# Patient Record
Sex: Female | Born: 1975 | Race: Black or African American | Hispanic: No | Smoking: Never smoker
Health system: Southern US, Community
[De-identification: ages and names within clinical notes are randomized; demographics above are authoritative.]

## PROBLEM LIST (undated history)

## (undated) DIAGNOSIS — I10 Essential (primary) hypertension: Secondary | ICD-10-CM

## (undated) DIAGNOSIS — D649 Anemia, unspecified: Secondary | ICD-10-CM

## (undated) DIAGNOSIS — I2699 Other pulmonary embolism without acute cor pulmonale: Secondary | ICD-10-CM

## (undated) DIAGNOSIS — N920 Excessive and frequent menstruation with regular cycle: Secondary | ICD-10-CM

## (undated) DIAGNOSIS — C541 Malignant neoplasm of endometrium: Secondary | ICD-10-CM

## (undated) HISTORY — DX: Excessive and frequent menstruation with regular cycle: N92.0

## (undated) HISTORY — DX: Other pulmonary embolism without acute cor pulmonale: I26.99

## (undated) HISTORY — DX: Anemia, unspecified: D64.9

## (undated) HISTORY — DX: Malignant neoplasm of endometrium: C54.1

---

## 2004-06-11 ENCOUNTER — Emergency Department: Payer: Self-pay | Admitting: Emergency Medicine

## 2004-08-25 ENCOUNTER — Other Ambulatory Visit: Payer: Self-pay

## 2004-08-25 ENCOUNTER — Emergency Department: Payer: Self-pay | Admitting: Emergency Medicine

## 2005-05-28 ENCOUNTER — Emergency Department: Payer: Self-pay | Admitting: Emergency Medicine

## 2005-08-19 ENCOUNTER — Emergency Department: Payer: Self-pay | Admitting: Unknown Physician Specialty

## 2006-04-14 ENCOUNTER — Emergency Department: Payer: Self-pay | Admitting: Emergency Medicine

## 2006-04-14 ENCOUNTER — Other Ambulatory Visit: Payer: Self-pay

## 2006-08-05 ENCOUNTER — Emergency Department: Payer: Self-pay | Admitting: Emergency Medicine

## 2006-10-13 ENCOUNTER — Emergency Department: Payer: Self-pay | Admitting: Emergency Medicine

## 2007-04-16 ENCOUNTER — Emergency Department: Payer: Self-pay | Admitting: Emergency Medicine

## 2007-04-28 ENCOUNTER — Emergency Department: Payer: Self-pay | Admitting: Emergency Medicine

## 2007-05-03 ENCOUNTER — Ambulatory Visit: Payer: Self-pay | Admitting: Family Medicine

## 2007-06-16 ENCOUNTER — Emergency Department: Payer: Self-pay | Admitting: Emergency Medicine

## 2007-10-14 ENCOUNTER — Emergency Department: Payer: Self-pay | Admitting: Emergency Medicine

## 2008-01-26 ENCOUNTER — Emergency Department: Payer: Self-pay | Admitting: Internal Medicine

## 2008-03-15 ENCOUNTER — Emergency Department: Payer: Self-pay | Admitting: Internal Medicine

## 2008-05-01 ENCOUNTER — Encounter: Payer: Self-pay | Admitting: Specialist

## 2008-05-25 ENCOUNTER — Encounter: Payer: Self-pay | Admitting: Specialist

## 2009-12-04 IMAGING — US US EXTREM LOW VENOUS*R*
1 series · 17 of 18 positions shown · non-contrast
Comparison: none

REASON FOR EXAM: pain,
COMMENTS:

[Series 1: us extrem low venous*right* · 17 of 18 slices shown]
[im 1/18]
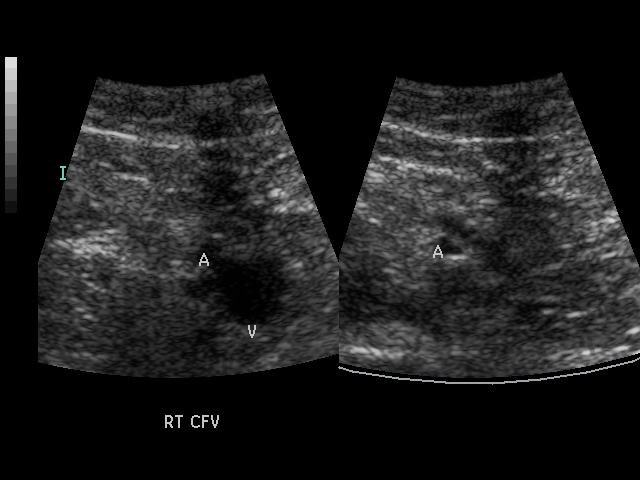
[im 2/18]
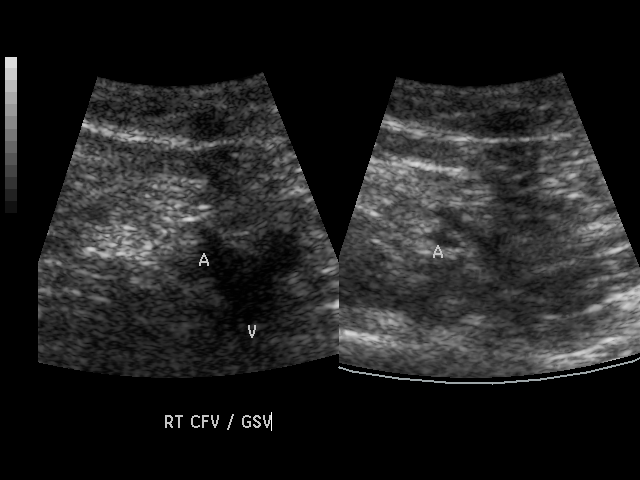
[im 3/18]
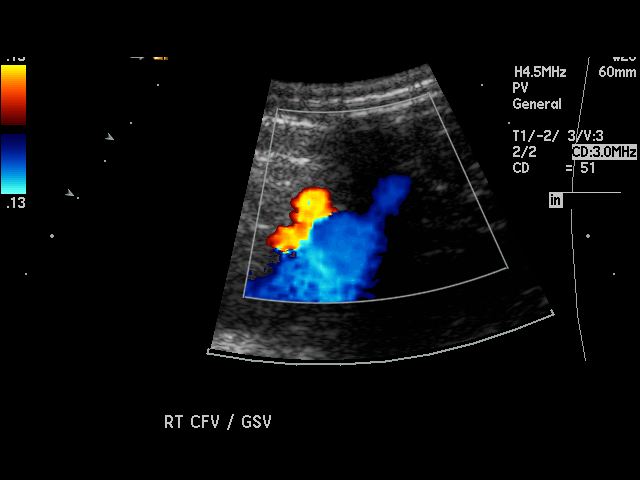
[im 4/18]
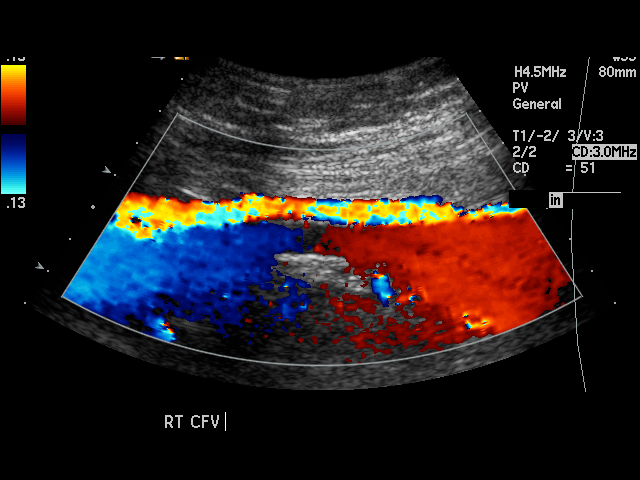
[im 5/18]
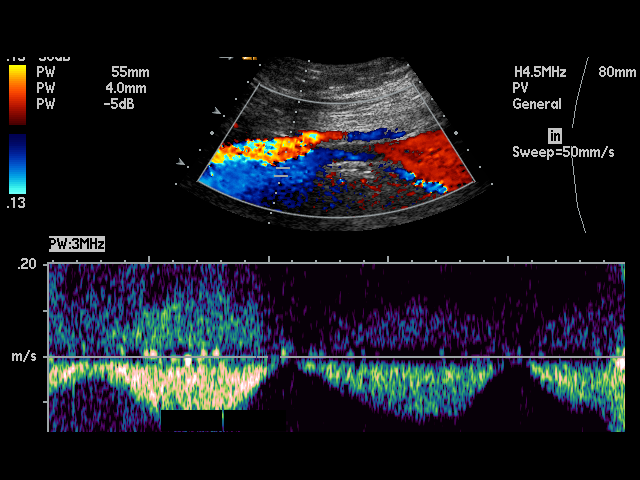
[im 6/18]
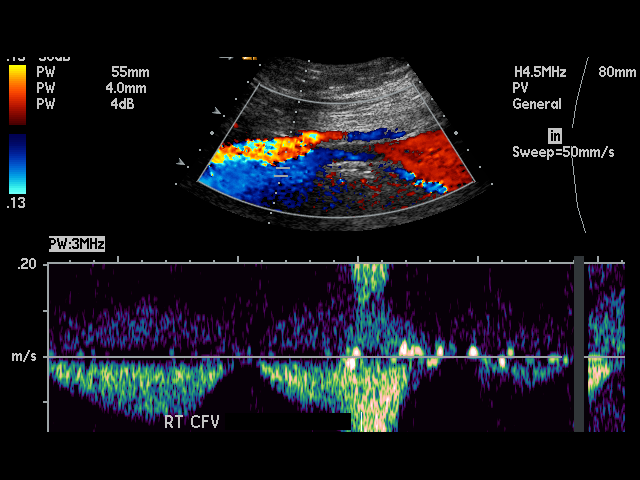
[im 7/18]
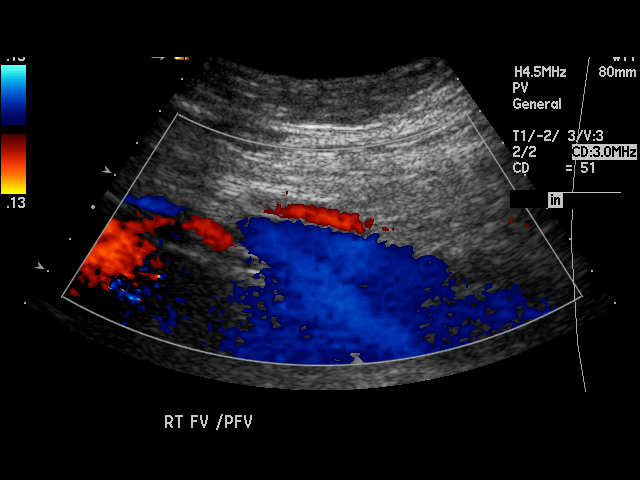
[im 8/18]
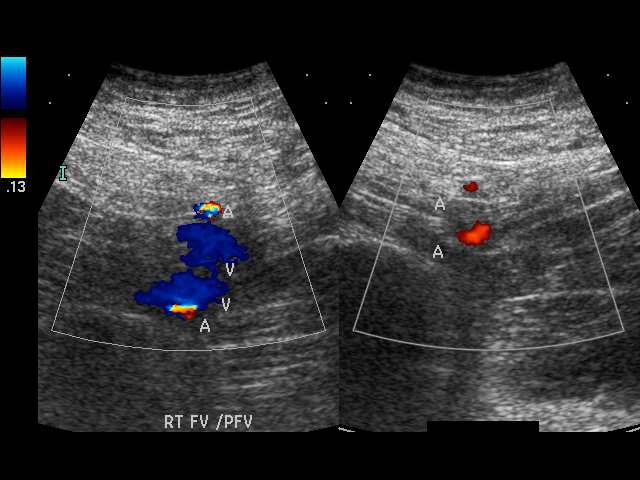
[im 10/18]
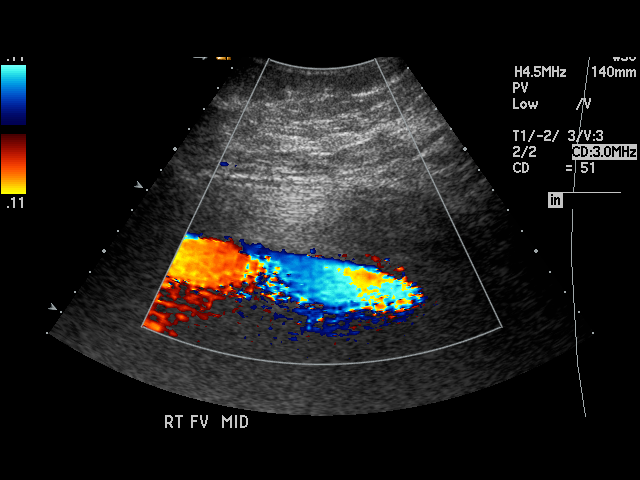
[im 11/18]
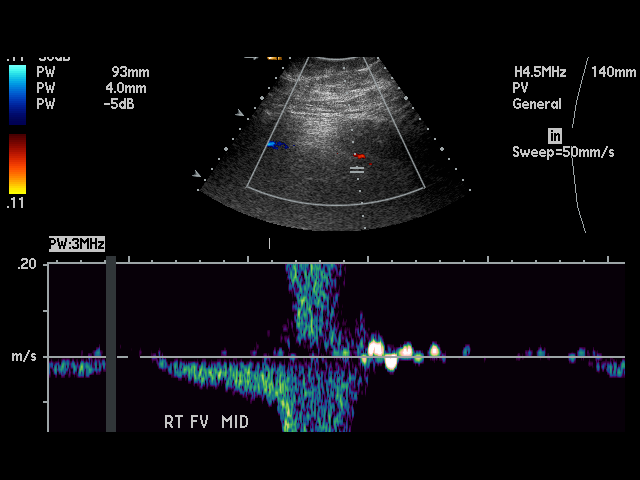
[im 12/18]
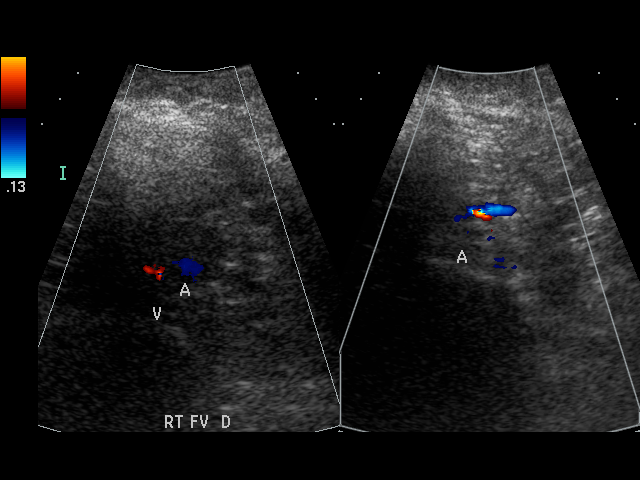
[im 13/18]
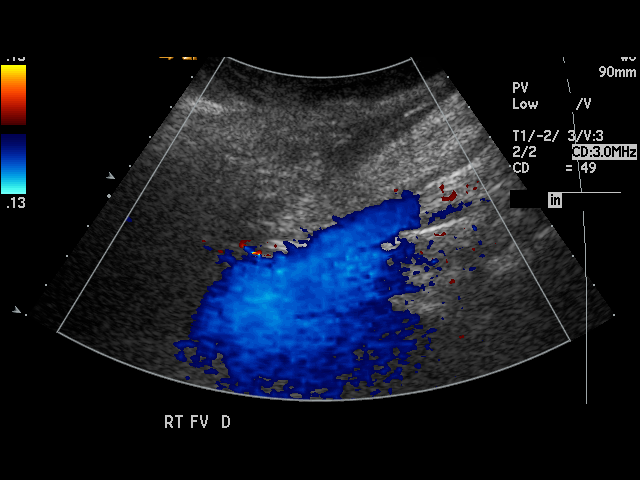
[im 14/18]
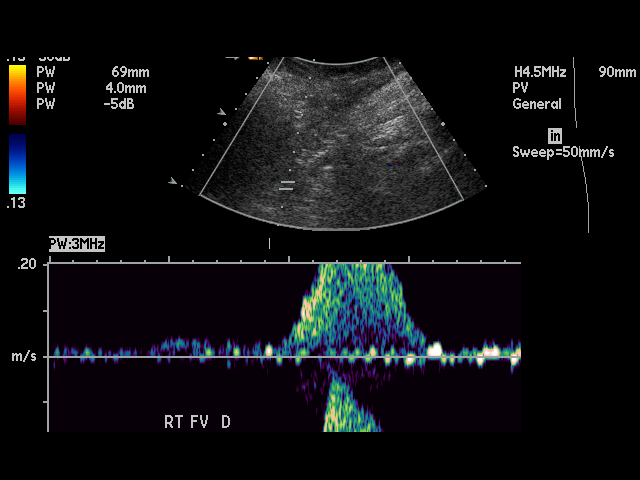
[im 15/18]
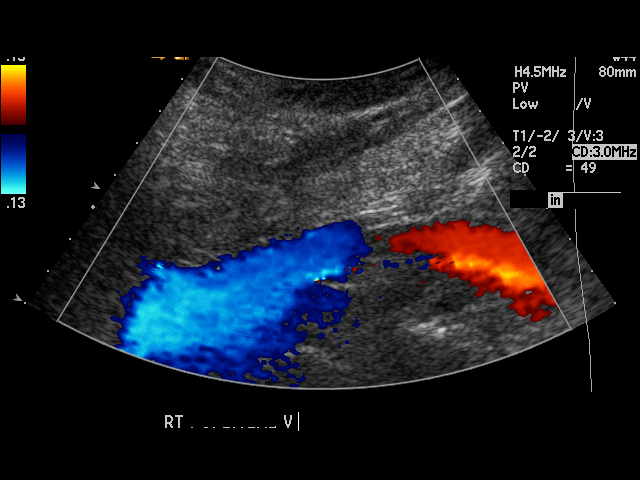
[im 16/18]
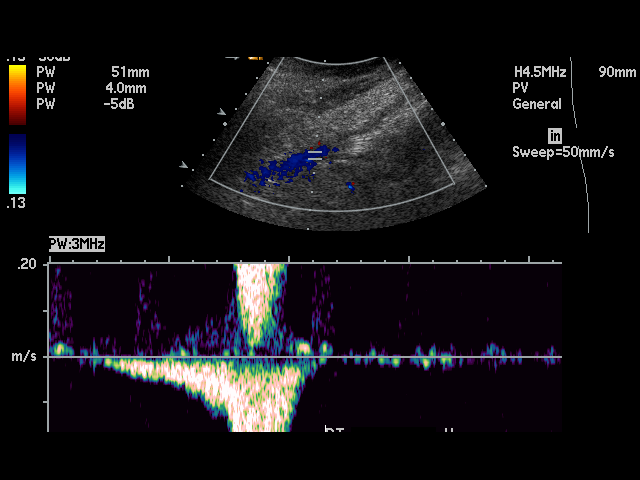
[im 17/18]
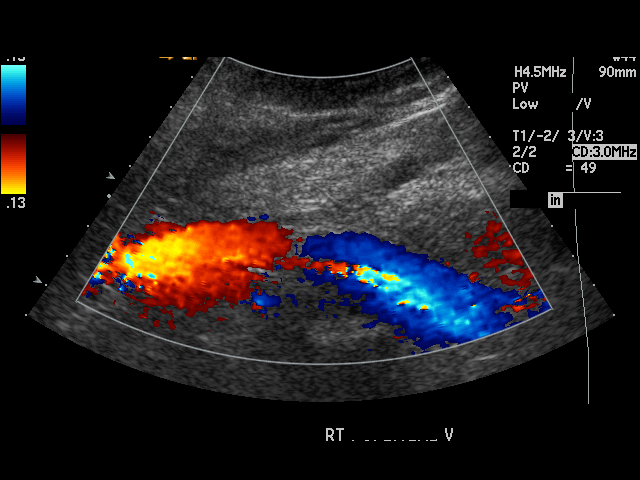
[im 18/18]
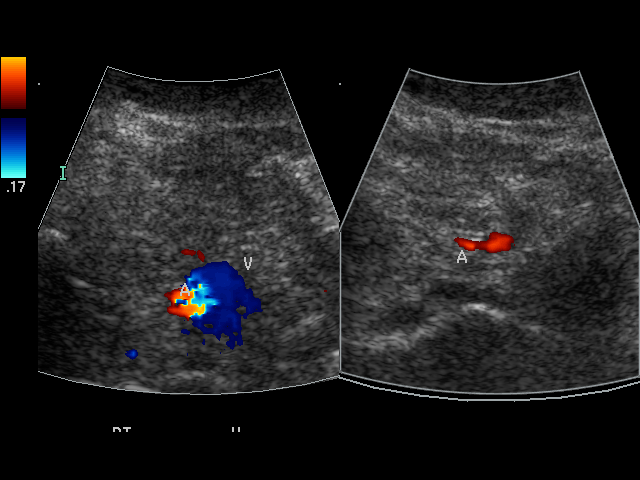

[17 of 18 positions shown; findings below may reference images not displayed]

PROCEDURE:     US  - US DOPPLER LOW EXTR RIGHT  - March 15, 2008  [DATE]

RESULT:     Duplex Doppler interrogation of the deep venous system of the
RIGHT leg is performed from the inguinal through the popliteal region. The
study is limited because of the patient's body habitus. There appears to be
normal compressibility of the deep venous structures with augmentation of
flow during distal calf compression. The color Doppler and spectral Doppler
appearance is unremarkable.
IMPRESSION: No evidence of RIGHT lower extremity deep vein thrombosis.

## 2010-06-17 ENCOUNTER — Emergency Department: Payer: Self-pay | Admitting: Emergency Medicine

## 2010-09-20 ENCOUNTER — Emergency Department: Payer: Self-pay | Admitting: Emergency Medicine

## 2010-12-12 ENCOUNTER — Emergency Department: Payer: Self-pay | Admitting: Emergency Medicine

## 2011-03-01 ENCOUNTER — Emergency Department: Payer: Self-pay | Admitting: Emergency Medicine

## 2011-06-16 ENCOUNTER — Emergency Department: Payer: Self-pay | Admitting: Emergency Medicine

## 2011-06-16 LAB — CBC
HCT: 30.2 % — ABNORMAL LOW (ref 35.0–47.0)
HGB: 9.5 g/dL — ABNORMAL LOW (ref 12.0–16.0)
MCH: 18.6 pg — ABNORMAL LOW (ref 26.0–34.0)
MCHC: 31.4 g/dL — ABNORMAL LOW (ref 32.0–36.0)
MCV: 59 fL — ABNORMAL LOW (ref 80–100)
Platelet: 443 10*3/uL — ABNORMAL HIGH (ref 150–440)
RBC: 5.1 10*6/uL (ref 3.80–5.20)
RDW: 20.7 % — ABNORMAL HIGH (ref 11.5–14.5)
WBC: 9.3 10*3/uL (ref 3.6–11.0)

## 2011-06-16 LAB — COMPREHENSIVE METABOLIC PANEL
Albumin: 3.2 g/dL — ABNORMAL LOW (ref 3.4–5.0)
Alkaline Phosphatase: 88 U/L (ref 50–136)
Anion Gap: 8 (ref 7–16)
BUN: 10 mg/dL (ref 7–18)
Bilirubin,Total: 0.3 mg/dL (ref 0.2–1.0)
Calcium, Total: 8.5 mg/dL (ref 8.5–10.1)
Chloride: 101 mmol/L (ref 98–107)
Co2: 28 mmol/L (ref 21–32)
Creatinine: 0.82 mg/dL (ref 0.60–1.30)
EGFR (African American): >60
EGFR (Non-African Amer.): >60
Glucose: 104 mg/dL — ABNORMAL HIGH (ref 65–99)
Osmolality: 273 (ref 275–301)
Potassium: 3.4 mmol/L — ABNORMAL LOW (ref 3.5–5.1)
SGOT(AST): 17 U/L (ref 15–37)
SGPT (ALT): 17 U/L
Sodium: 137 mmol/L (ref 136–145)
Total Protein: 8.7 g/dL — ABNORMAL HIGH (ref 6.4–8.2)

## 2011-06-16 LAB — PREGNANCY, URINE: Pregnancy Test, Urine: NEGATIVE m[IU]/mL

## 2011-06-16 LAB — TROPONIN I: Troponin-I: <0.02 ng/mL

## 2012-06-11 IMAGING — CR DG CHEST 2V
1 series · 3 of 3 positions shown · non-contrast
Comparison: none

REASON FOR EXAM: CP
COMMENTS:   May transport without cardiac monitor

PROCEDURE:     DXR - DXR CHEST PA (OR AP) AND LATERAL  - September 21, 2010  [DATE]
RESULT:     Comparison: None

[Series 1: view not recorded · 0.17mm/px · 3 of 3 slices shown]
[im 1/3]
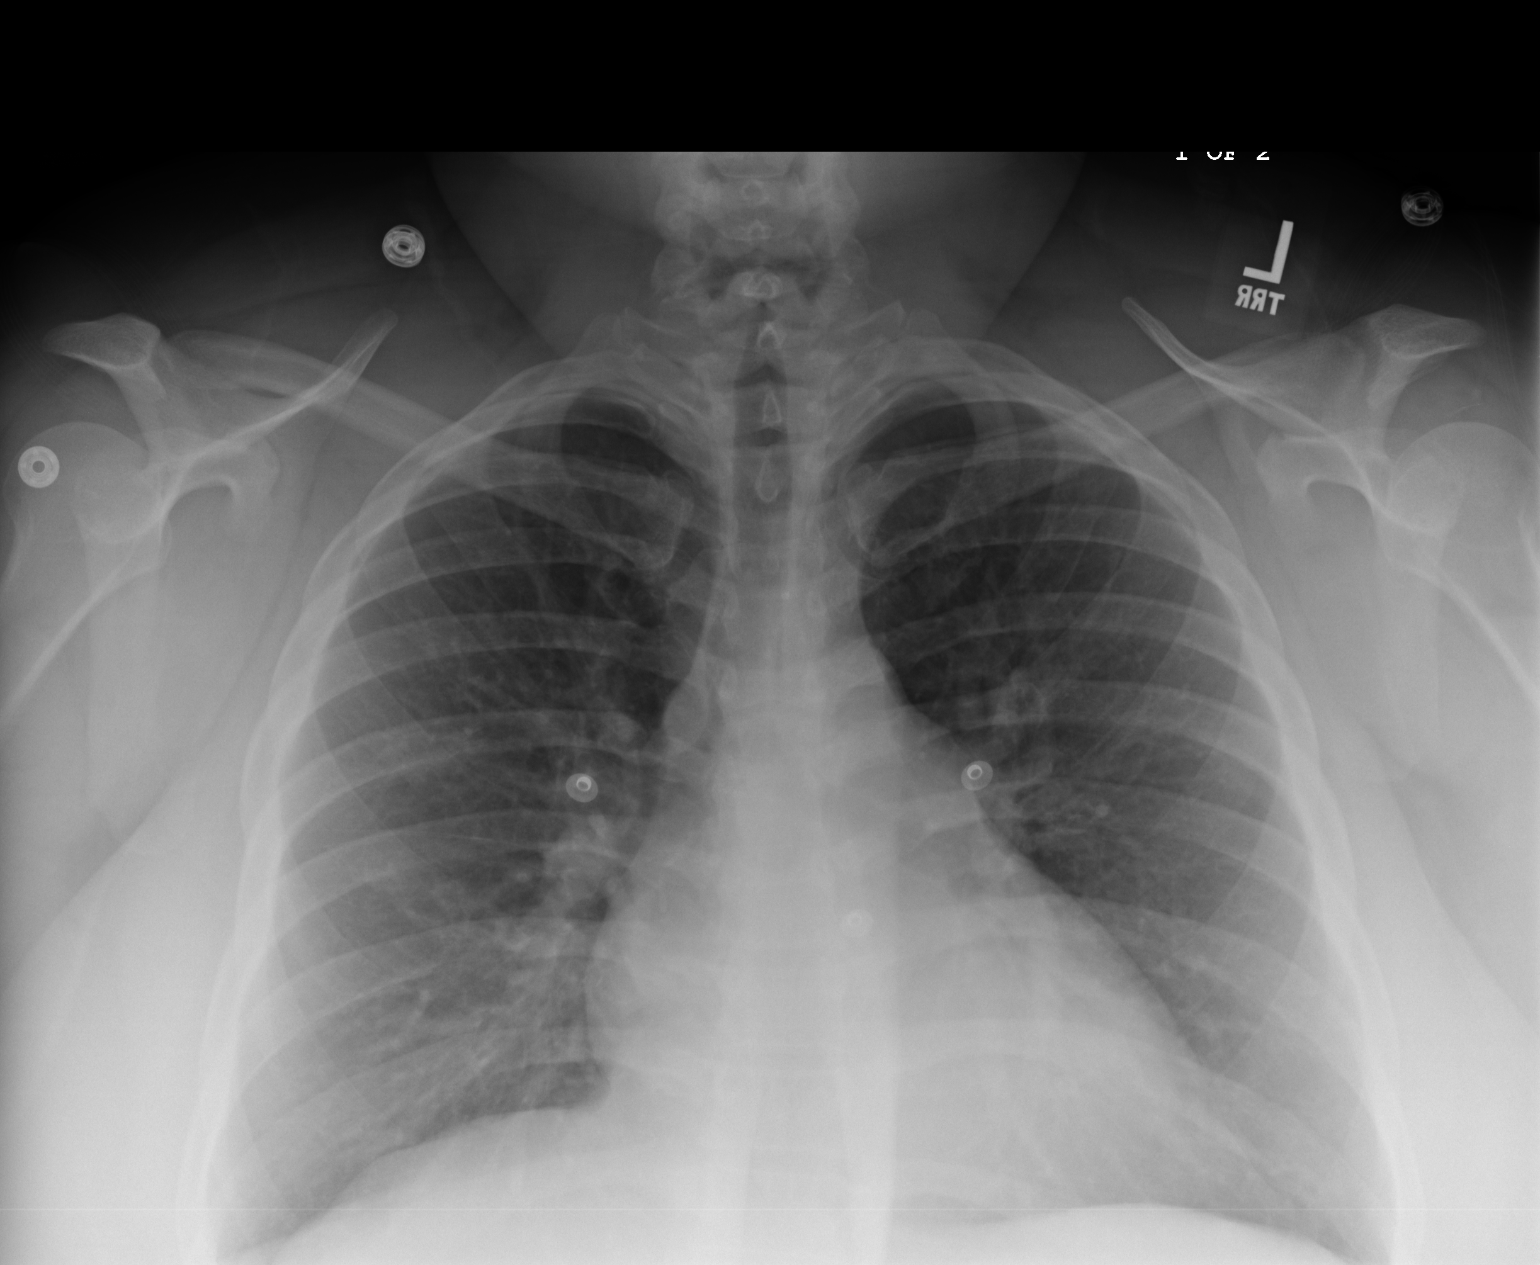
[im 2/3]
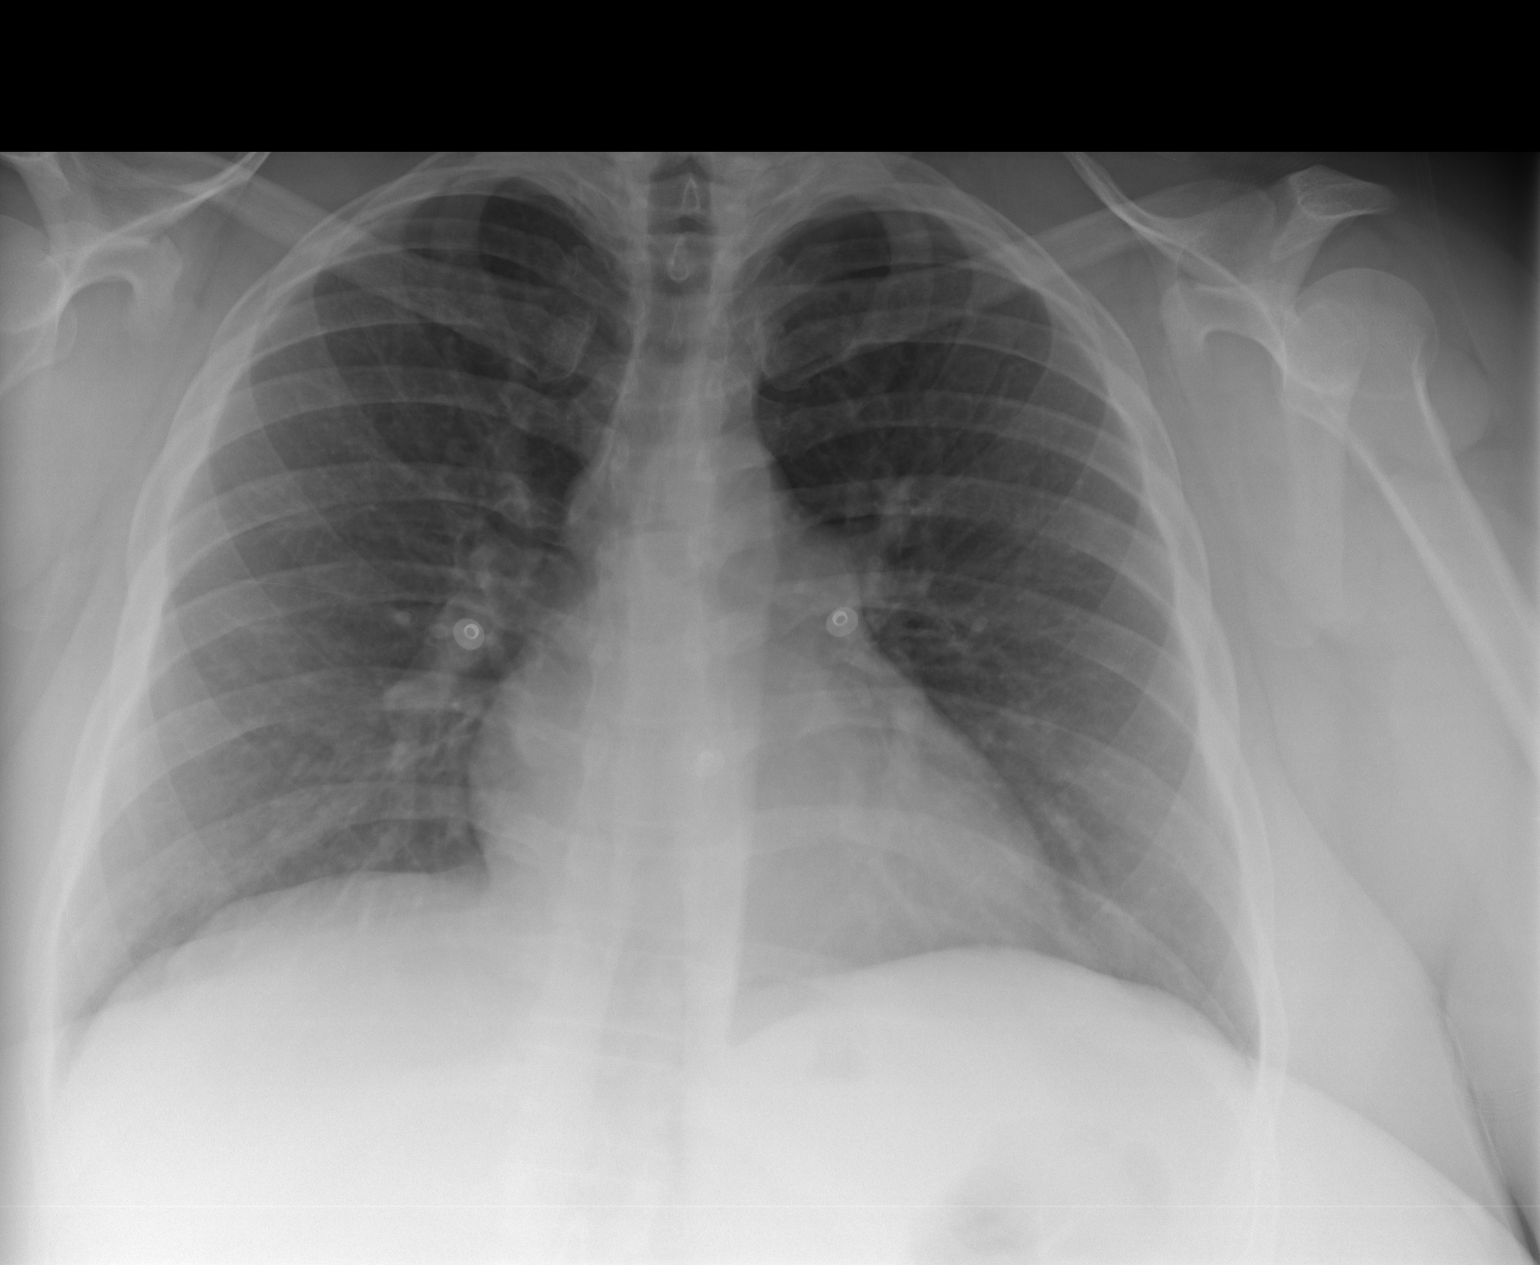
[im 3/3]
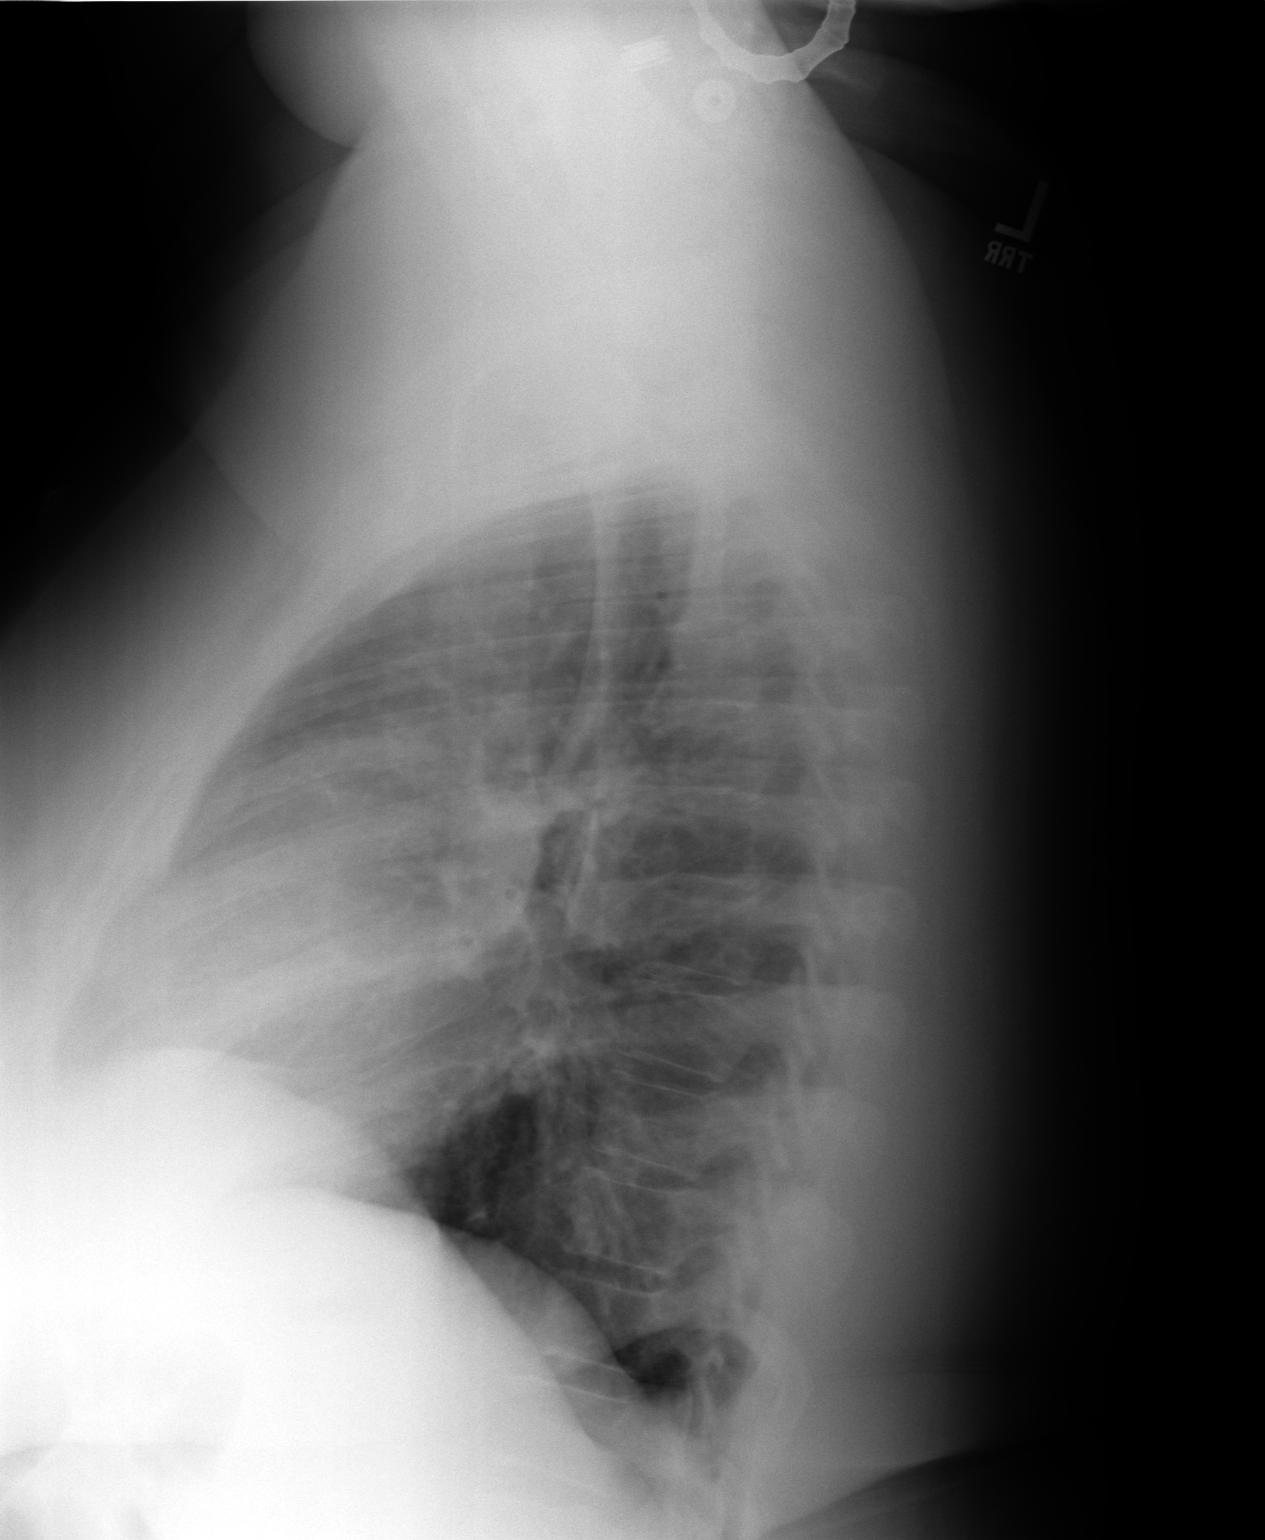

[3 of 3 positions shown; findings below may reference images not displayed]

FINDINGS: PA and lateral chest radiographs are provided.  There is no focal
parenchymal opacity, pleural effusion, or pneumothorax. The heart and
mediastinum are unremarkable.  The osseous structures are unremarkable.
IMPRESSION: No acute disease of the chest.

## 2012-07-09 ENCOUNTER — Emergency Department: Payer: Self-pay | Admitting: Emergency Medicine

## 2012-07-09 LAB — COMPREHENSIVE METABOLIC PANEL
Albumin: 3.2 g/dL — ABNORMAL LOW (ref 3.4–5.0)
Alkaline Phosphatase: 84 U/L (ref 50–136)
Anion Gap: 4 — ABNORMAL LOW (ref 7–16)
BUN: 8 mg/dL (ref 7–18)
Bilirubin,Total: 0.3 mg/dL (ref 0.2–1.0)
Calcium, Total: 9 mg/dL (ref 8.5–10.1)
Chloride: 105 mmol/L (ref 98–107)
Co2: 28 mmol/L (ref 21–32)
Creatinine: 0.75 mg/dL (ref 0.60–1.30)
EGFR (African American): >60
EGFR (Non-African Amer.): >60
Glucose: 121 mg/dL — ABNORMAL HIGH (ref 65–99)
Osmolality: 273 (ref 275–301)
Potassium: 4.2 mmol/L (ref 3.5–5.1)
SGOT(AST): 20 U/L (ref 15–37)
SGPT (ALT): 17 U/L (ref 12–78)
Sodium: 137 mmol/L (ref 136–145)
Total Protein: 8.7 g/dL — ABNORMAL HIGH (ref 6.4–8.2)

## 2012-07-09 LAB — URINALYSIS, COMPLETE
Bilirubin,UR: NEGATIVE
Glucose,UR: NEGATIVE mg/dL (ref 0–75)
Ketone: NEGATIVE
Nitrite: NEGATIVE
Ph: 6 (ref 4.5–8.0)
Protein: 30
RBC,UR: 6 /HPF (ref 0–5)
Specific Gravity: 1.023 (ref 1.003–1.030)
Squamous Epithelial: 14
WBC UR: 17 /HPF (ref 0–5)

## 2012-07-09 LAB — CBC
HCT: 28 % — ABNORMAL LOW (ref 35.0–47.0)
HGB: 9.2 g/dL — ABNORMAL LOW (ref 12.0–16.0)
MCH: 19.2 pg — ABNORMAL LOW (ref 26.0–34.0)
MCHC: 32.8 g/dL (ref 32.0–36.0)
MCV: 59 fL — ABNORMAL LOW (ref 80–100)
Platelet: 496 10*3/uL — ABNORMAL HIGH (ref 150–440)
RBC: 4.78 10*6/uL (ref 3.80–5.20)
RDW: 20.3 % — ABNORMAL HIGH (ref 11.5–14.5)
WBC: 9.4 10*3/uL (ref 3.6–11.0)

## 2012-07-09 LAB — MAGNESIUM: Magnesium: 1.9 mg/dL

## 2012-08-22 LAB — CBC
HCT: 22.8 % — ABNORMAL LOW (ref 35.0–47.0)
HGB: 7.4 g/dL — ABNORMAL LOW (ref 12.0–16.0)
MCH: 18.1 pg — ABNORMAL LOW (ref 26.0–34.0)
MCHC: 32.6 g/dL (ref 32.0–36.0)
MCV: 56 fL — ABNORMAL LOW (ref 80–100)
Platelet: 454 10*3/uL — ABNORMAL HIGH (ref 150–440)
RBC: 4.1 10*6/uL (ref 3.80–5.20)
RDW: 19.4 % — ABNORMAL HIGH (ref 11.5–14.5)
WBC: 9.8 10*3/uL (ref 3.6–11.0)

## 2012-08-22 LAB — PRO B NATRIURETIC PEPTIDE: B-Type Natriuretic Peptide: 27 pg/mL (ref 0–125)

## 2012-08-22 LAB — COMPREHENSIVE METABOLIC PANEL
Albumin: 3.2 g/dL — ABNORMAL LOW (ref 3.4–5.0)
Alkaline Phosphatase: 71 U/L (ref 50–136)
Anion Gap: 5 — ABNORMAL LOW (ref 7–16)
BUN: 11 mg/dL (ref 7–18)
Bilirubin,Total: 0.4 mg/dL (ref 0.2–1.0)
Calcium, Total: 8.6 mg/dL (ref 8.5–10.1)
Chloride: 106 mmol/L (ref 98–107)
Co2: 27 mmol/L (ref 21–32)
Creatinine: 0.89 mg/dL (ref 0.60–1.30)
EGFR (African American): >60
EGFR (Non-African Amer.): >60
Glucose: 101 mg/dL — ABNORMAL HIGH (ref 65–99)
Osmolality: 275 (ref 275–301)
Potassium: 3.4 mmol/L — ABNORMAL LOW (ref 3.5–5.1)
SGOT(AST): 15 U/L (ref 15–37)
SGPT (ALT): 17 U/L (ref 12–78)
Sodium: 138 mmol/L (ref 136–145)
Total Protein: 8.4 g/dL — ABNORMAL HIGH (ref 6.4–8.2)

## 2012-08-22 LAB — TROPONIN I: Troponin-I: <0.02 ng/mL

## 2012-08-23 ENCOUNTER — Observation Stay: Payer: Self-pay | Admitting: Internal Medicine

## 2012-08-23 LAB — IRON AND TIBC
Iron Bind.Cap.(Total): 341 ug/dL (ref 250–450)
Iron Saturation: 4 %
Iron: 14 ug/dL — ABNORMAL LOW (ref 50–170)
Unbound Iron-Bind.Cap.: 327 ug/dL

## 2012-08-23 LAB — HEMOGLOBIN: HGB: 7.7 g/dL — ABNORMAL LOW (ref 12.0–16.0)

## 2012-08-23 LAB — CK TOTAL AND CKMB (NOT AT ARMC)
CK, Total: 76 U/L (ref 21–215)
CK, Total: 73 U/L (ref 21–215)
CK-MB: <0.5 ng/mL — ABNORMAL LOW (ref 0.5–3.6)
CK-MB: <0.5 ng/mL — ABNORMAL LOW (ref 0.5–3.6)

## 2012-08-23 LAB — TROPONIN I
Troponin-I: <0.02 ng/mL
Troponin-I: <0.02 ng/mL

## 2012-08-23 LAB — FERRITIN: Ferritin (ARMC): 5 ng/mL — ABNORMAL LOW (ref 8–388)

## 2012-08-24 LAB — BASIC METABOLIC PANEL
Anion Gap: 5 — ABNORMAL LOW (ref 7–16)
BUN: 8 mg/dL (ref 7–18)
Calcium, Total: 9.5 mg/dL (ref 8.5–10.1)
Chloride: 106 mmol/L (ref 98–107)
Co2: 27 mmol/L (ref 21–32)
Creatinine: 0.83 mg/dL (ref 0.60–1.30)
EGFR (African American): >60
EGFR (Non-African Amer.): >60
Glucose: 150 mg/dL — ABNORMAL HIGH (ref 65–99)
Osmolality: 277 (ref 275–301)
Potassium: 4 mmol/L (ref 3.5–5.1)
Sodium: 138 mmol/L (ref 136–145)

## 2012-08-24 LAB — CBC WITH DIFFERENTIAL/PLATELET
HCT: 26.6 % — ABNORMAL LOW (ref 35.0–47.0)
HGB: 8.5 g/dL — ABNORMAL LOW (ref 12.0–16.0)
Lymphocytes: 13 %
MCH: 18.5 pg — ABNORMAL LOW (ref 26.0–34.0)
MCHC: 31.9 g/dL — ABNORMAL LOW (ref 32.0–36.0)
MCV: 58 fL — ABNORMAL LOW (ref 80–100)
Platelet: 466 10*3/uL — ABNORMAL HIGH (ref 150–440)
RBC: 4.59 10*6/uL (ref 3.80–5.20)
RDW: 20.5 % — ABNORMAL HIGH (ref 11.5–14.5)
Segmented Neutrophils: 87 %
WBC: 14.4 10*3/uL — ABNORMAL HIGH (ref 3.6–11.0)

## 2012-10-12 ENCOUNTER — Emergency Department: Payer: Self-pay | Admitting: Internal Medicine

## 2013-04-08 ENCOUNTER — Emergency Department: Payer: Self-pay | Admitting: Emergency Medicine

## 2013-04-08 LAB — COMPREHENSIVE METABOLIC PANEL
Albumin: 3.1 g/dL — ABNORMAL LOW (ref 3.4–5.0)
Alkaline Phosphatase: 81 U/L
Anion Gap: 6 — ABNORMAL LOW (ref 7–16)
BUN: 13 mg/dL (ref 7–18)
Bilirubin,Total: 0.3 mg/dL (ref 0.2–1.0)
Calcium, Total: 8.9 mg/dL (ref 8.5–10.1)
Chloride: 102 mmol/L (ref 98–107)
Co2: 27 mmol/L (ref 21–32)
Creatinine: 1.03 mg/dL (ref 0.60–1.30)
EGFR (African American): >60
EGFR (Non-African Amer.): >60
Glucose: 108 mg/dL — ABNORMAL HIGH (ref 65–99)
Osmolality: 271 (ref 275–301)
Potassium: 3.6 mmol/L (ref 3.5–5.1)
SGOT(AST): 14 U/L — ABNORMAL LOW (ref 15–37)
SGPT (ALT): 16 U/L (ref 12–78)
Sodium: 135 mmol/L — ABNORMAL LOW (ref 136–145)
Total Protein: 8.6 g/dL — ABNORMAL HIGH (ref 6.4–8.2)

## 2013-04-08 LAB — CBC
HCT: 28.3 % — ABNORMAL LOW (ref 35.0–47.0)
HGB: 9.4 g/dL — ABNORMAL LOW (ref 12.0–16.0)
MCH: 19.1 pg — ABNORMAL LOW (ref 26.0–34.0)
MCHC: 33.1 g/dL (ref 32.0–36.0)
MCV: 58 fL — ABNORMAL LOW (ref 80–100)
Platelet: 447 10*3/uL — ABNORMAL HIGH (ref 150–440)
RBC: 4.91 10*6/uL (ref 3.80–5.20)
RDW: 20.5 % — ABNORMAL HIGH (ref 11.5–14.5)
WBC: 7.3 10*3/uL (ref 3.6–11.0)

## 2013-04-08 LAB — URINALYSIS, COMPLETE
Bacteria: NONE SEEN
Bilirubin,UR: NEGATIVE
Glucose,UR: NEGATIVE mg/dL (ref 0–75)
Ketone: NEGATIVE
Leukocyte Esterase: NEGATIVE
Nitrite: NEGATIVE
Ph: 6 (ref 4.5–8.0)
Protein: 30
RBC,UR: 383 /HPF (ref 0–5)
Specific Gravity: 1.018 (ref 1.003–1.030)
Squamous Epithelial: 3
WBC UR: <1 /HPF (ref 0–5)

## 2013-04-08 LAB — TROPONIN I: Troponin-I: <0.02 ng/mL

## 2013-04-15 ENCOUNTER — Emergency Department: Payer: Self-pay | Admitting: Emergency Medicine

## 2013-04-20 LAB — BETA STREP CULTURE(ARMC)

## 2013-06-10 ENCOUNTER — Emergency Department: Payer: Self-pay | Admitting: Emergency Medicine

## 2013-06-10 LAB — BASIC METABOLIC PANEL
Anion Gap: 6 — ABNORMAL LOW (ref 7–16)
BUN: 7 mg/dL (ref 7–18)
Calcium, Total: 8.7 mg/dL (ref 8.5–10.1)
Chloride: 104 mmol/L (ref 98–107)
Co2: 27 mmol/L (ref 21–32)
Creatinine: 0.72 mg/dL (ref 0.60–1.30)
EGFR (African American): >60
EGFR (Non-African Amer.): >60
Glucose: 121 mg/dL — ABNORMAL HIGH (ref 65–99)
Osmolality: 273 (ref 275–301)
Potassium: 3.7 mmol/L (ref 3.5–5.1)
Sodium: 137 mmol/L (ref 136–145)

## 2013-06-10 LAB — CBC
HCT: 27.8 % — ABNORMAL LOW (ref 35.0–47.0)
HGB: 8.7 g/dL — ABNORMAL LOW (ref 12.0–16.0)
MCH: 18 pg — ABNORMAL LOW (ref 26.0–34.0)
MCHC: 31.5 g/dL — ABNORMAL LOW (ref 32.0–36.0)
MCV: 57 fL — ABNORMAL LOW (ref 80–100)
Platelet: 518 10*3/uL — ABNORMAL HIGH (ref 150–440)
RBC: 4.85 10*6/uL (ref 3.80–5.20)
RDW: 20.5 % — ABNORMAL HIGH (ref 11.5–14.5)
WBC: 9 10*3/uL (ref 3.6–11.0)

## 2013-06-10 LAB — TROPONIN I: Troponin-I: <0.02 ng/mL

## 2013-06-15 ENCOUNTER — Emergency Department: Payer: Self-pay | Admitting: Emergency Medicine

## 2013-06-15 LAB — CBC
HCT: 28.1 % — ABNORMAL LOW (ref 35.0–47.0)
HGB: 8.8 g/dL — ABNORMAL LOW (ref 12.0–16.0)
MCH: 18 pg — ABNORMAL LOW (ref 26.0–34.0)
MCHC: 31.4 g/dL — ABNORMAL LOW (ref 32.0–36.0)
MCV: 57 fL — ABNORMAL LOW (ref 80–100)
Platelet: 525 10*3/uL — ABNORMAL HIGH (ref 150–440)
RBC: 4.92 10*6/uL (ref 3.80–5.20)
RDW: 20.2 % — ABNORMAL HIGH (ref 11.5–14.5)
WBC: 8.2 10*3/uL (ref 3.6–11.0)

## 2013-06-15 LAB — URINALYSIS, COMPLETE
Bacteria: NONE SEEN
Bilirubin,UR: NEGATIVE
Glucose,UR: NEGATIVE mg/dL (ref 0–75)
Ketone: NEGATIVE
Leukocyte Esterase: NEGATIVE
Nitrite: NEGATIVE
Ph: 6 (ref 4.5–8.0)
Protein: NEGATIVE
RBC,UR: 2 /HPF (ref 0–5)
Specific Gravity: 1.006 (ref 1.003–1.030)
Squamous Epithelial: 4
WBC UR: 2 /HPF (ref 0–5)

## 2013-06-15 LAB — BASIC METABOLIC PANEL
Anion Gap: 7 (ref 7–16)
BUN: 7 mg/dL (ref 7–18)
Calcium, Total: 8.8 mg/dL (ref 8.5–10.1)
Chloride: 104 mmol/L (ref 98–107)
Co2: 25 mmol/L (ref 21–32)
Creatinine: 0.94 mg/dL (ref 0.60–1.30)
EGFR (African American): >60
EGFR (Non-African Amer.): >60
Glucose: 110 mg/dL — ABNORMAL HIGH (ref 65–99)
Osmolality: 271 (ref 275–301)
Potassium: 3.4 mmol/L — ABNORMAL LOW (ref 3.5–5.1)
Sodium: 136 mmol/L (ref 136–145)

## 2013-06-15 LAB — TROPONIN I: Troponin-I: <0.02 ng/mL

## 2013-06-16 LAB — TROPONIN I: Troponin-I: <0.02 ng/mL

## 2014-06-16 NOTE — H&P (Signed)
PATIENT NAME:  Jean Morris, Jean Morris MR#:  [id number] DATE OF BIRTH:  1975/12/18  DATE OF ADMISSION:  08/23/2012  PRIMARY CARE PHYSICIAN: Dr. Delight Stare.   REFERRING PHYSICIAN: Dr. Corky Downs.    CHIEF COMPLAINT: Chest pain.   HISTORY OF PRESENT ILLNESS: Jean Morris is a 39 year old, pleasant, African American female with history of morbid obesity, menometrorrhagia, anemia secondary to menometrorrhagia. Follows up with [facility]. Presented to the Emergency Department with complaints of chest pain, started around 8 p.m., The patient was getting ready to have dinner, started to experience chest pain on the left side under the breast, dull aching pain, 10/10 intensity, associated with shortness of breath, lightheadedness. Concerning this, came to the Emergency Department. Workup in the Emergency Department with EKG and cardiac enzymes were unremarkable. Initial lab work revealed the patient has hemoglobin of 7.4. The patient's baseline is 9. The patient states gets menometrorrhagia lasting for 1 to 2 months. The patient recently followed at [facility] and underwent some procedures. Since then, has reduced. The patient's MCV is 56. No previous history of chest pain.   PAST MEDICAL HISTORY:  1. Anemia.  2. Iron deficiency.  3. Menometrorrhagia.   PAST SURGICAL HISTORY: C-section.   ALLERGIES: No known drug allergies.   HOME MEDICATIONS: None.   SOCIAL HISTORY: No history of smoking, drinking alcohol or using illicit drugs. Lives with her daughter. Works with social services.   FAMILY HISTORY: Multiple family members had heart attacks.   REVIEW OF SYSTEMS:  CONSTITUTIONAL: No generalized weakness or fatigue.  EYES: No change in vision.   ENT: No tinnitus.  RESPIRATORY: No cough, shortness of breath.  CARDIOVASCULAR: Has chest pain. No pedal edema.  GASTROINTESTINAL: No nausea, vomiting, abdominal pain, history of GERD.  GENITOURINARY: No dysuria or hematuria.  SKIN: No rash or lesions.   HEMATOLOGY: No easy bruising or bleeding.  MUSCULOSKELETAL: No joint pains or aches.  ENDOCRINE: No polyuria or polydipsia.  PSYCHIATRIC: The patient has history of anxiety.  NEUROLOGIC: No weakness or numbness in any part of the body.   PHYSICAL EXAMINATION:  GENERAL: This is a well-built, well-nourished, morbidly obese female lying down in the bed, not in distress.  VITAL SIGNS: Temperature 98.4, pulse 98, blood pressure 146/65, respiratory rate of 20, oxygen saturation 100% on room air.  HEENT: Head normocephalic, atraumatic. There is no scleral icterus. Conjunctivae normal. Pupils equal and react to light. No pharyngeal erythema.  NECK: Supple. No lymphadenopathy. No JVD. No carotid bruit.  CHEST: Has mild focal tenderness in the left parasternal area under the left breast.  LUNGS: Bilaterally clear to auscultation.  HEART: S1, S2 regular. No murmurs are heard.  ABDOMEN: Bowel sounds present. Soft, nontender, nondistended. Obese abdomen. Could not appreciate any hepatosplenomegaly.  EXTREMITIES: No pedal edema. Pulses 2+.  SKIN: No rash or lesions.  NEUROLOGIC: The patient is alert, oriented to place, person and time. Cranial nerves II through XII intact. Motor 5/5 in upper and lower extremities. No sensory deficits.   LABS: CBC: Hemoglobin of 7.4, MCV 56. The rest of the values are within normal limits. CMP: Potassium of 3.4. The rest of the values are within normal limits.   D-dimer is 0.41. BNP is 27.   ASSESSMENT AND PLAN: Jean Morris is a 39 year old female who comes to the Emergency Department with chest pain.  1. Chest pain: This seems to be more of a musculoskeletal pain. Will rule out with cardiac enzymes. Risk factors for this patient are obesity and anemia, as  well as family history. Will also obtain lipid profile. If cardiac enzymes are negative, the patient could be discharged home with followup with primary care physician, having a stress test as an outpatient.  2. Anemia:  One unit of packed red blood cells has been ordered by the Emergency Department physician. Will also place the patient on iron supplements.  3. Morbid obesity: Counseled with the patient. The patient expressed understanding.  4. Keep the patient on deep vein thrombosis prophylaxis with sequential compression devices.  Time spent 65mn.     ____________________________ PMonica Becton MD pv:gb D: 08/23/2012 02:13:57 ET T: 08/23/2012 04:00:35 ET JOB#: [id number] cc: PMonica Becton MD, <Dictator> PMonica BectonMD ELECTRONICALLY SIGNED 09/01/2012 21:55

## 2014-06-16 NOTE — Discharge Summary (Signed)
PATIENT NAME:  Jean Morris, ELLINGWOOD MR#:  [id number] DATE OF BIRTH:  [birth year]  DATE OF ADMISSION:  08/23/2012 DATE OF DISCHARGE:  08/24/2012  REASON FOR ADMISSION:  The patient came in with chest pain and shortness of breath, found to be anemic, was admitted as an observation and given a unit of blood and cardiac enzymes were ordered.   LABORATORY AND RADIOLOGICAL DATA DURING THE HOSPITAL COURSE: Included an EKG that showed a sinus tachycardia, incomplete right bundle branch block. Vitamin B12 of 470. Ferritin 5, TIBC 341, iron serum 14. BNP 27. D-dimer 0.41. Glucose 101, BUN 11, creatinine 0.89, sodium 138, potassium 3.4, chloride 106, CO2 27, calcium 8.6. Liver function tests normal range. White blood cell count 9.8, H and H 7.4 and 22.8, platelet count of 454, MCV of 56. Next 2 troponins were negative. Chest x-ray no acute cardiopulmonary disease. Repeat hemoglobin 7.7 on the 30th, repeat hemoglobin on the first 8.5.   HOSPITAL COURSE PER PROBLEM LIST:  1.  For the chest pain and shortness of breath, I believe this is musculoskeletal pain. The patient was given a dose of Solu-Medrol and given a prednisone taper. Upon discharge, chest pain and shortness of breath resolved.  2.  For the patient's symptomatic anemia and iron deficiency anemia, the patient was given a transfusion of packed red blood cells, also given IV Venofer.  Her hemoglobin did come up the next day to 8.5. The patient was discharged home.  3.  For her menorrhagia, she was advised to get to her gynecologist and consider a hysterectomy.  4.  Obesity with a BMI of 70. Weight loss needed.   FINAL DIAGNOSES 1.  Chest pain and shortness of breath, believed to be musculoskeletal.  2.  Symptomatic severe iron deficiency anemia.  3.  Menorrhagia.  4.  Obesity.   MEDICATIONS ON DISCHARGE: Include a prednisone taper 5 mg, 3 tabs day 1, 2 tabs day 2, 1 tab day 3, 4 and 5. Ferrous sulfate 325 mg 3 times a day with meals and MiraLax 17 grams  orally daily as needed for constipation.   FOLLOWUP:  In 1 to 2 weeks at the [facility] clinic. Follow up with her gynecologist at [facility].   DIET:  Regular, regular consistency. The patient was asked to not eat corn starch, which could be contributing to the anemia.   TIME SPENT ON DISCHARGE: 35 minutes.   ____________________________ Tana Conch. Leslye Peer, MD rjw:cs D: 08/24/2012 16:48:34 ET T: 08/24/2012 19:53:50 ET JOB#: [id number]  cc: Tana Conch. Leslye Peer, MD, <Dictator> Browns MD ELECTRONICALLY SIGNED 09/04/2012 11:05

## 2014-07-03 IMAGING — CR DG FOREARM 2V*L*
1 series · 3 of 3 positions shown · non-contrast
Comparison: none

REASON FOR EXAM: fall, pain
COMMENTS:   LMP: One week ago

PROCEDURE:     DXR - DXR FOREARM LEFT  - October 12, 2012  [DATE]
RESULT:     There is no evidence of fracture, dislocation, or malalignment.

[Series 1: x forearm ap left · 0.14mm/px · 3 of 3 slices shown]
[im 1/3]
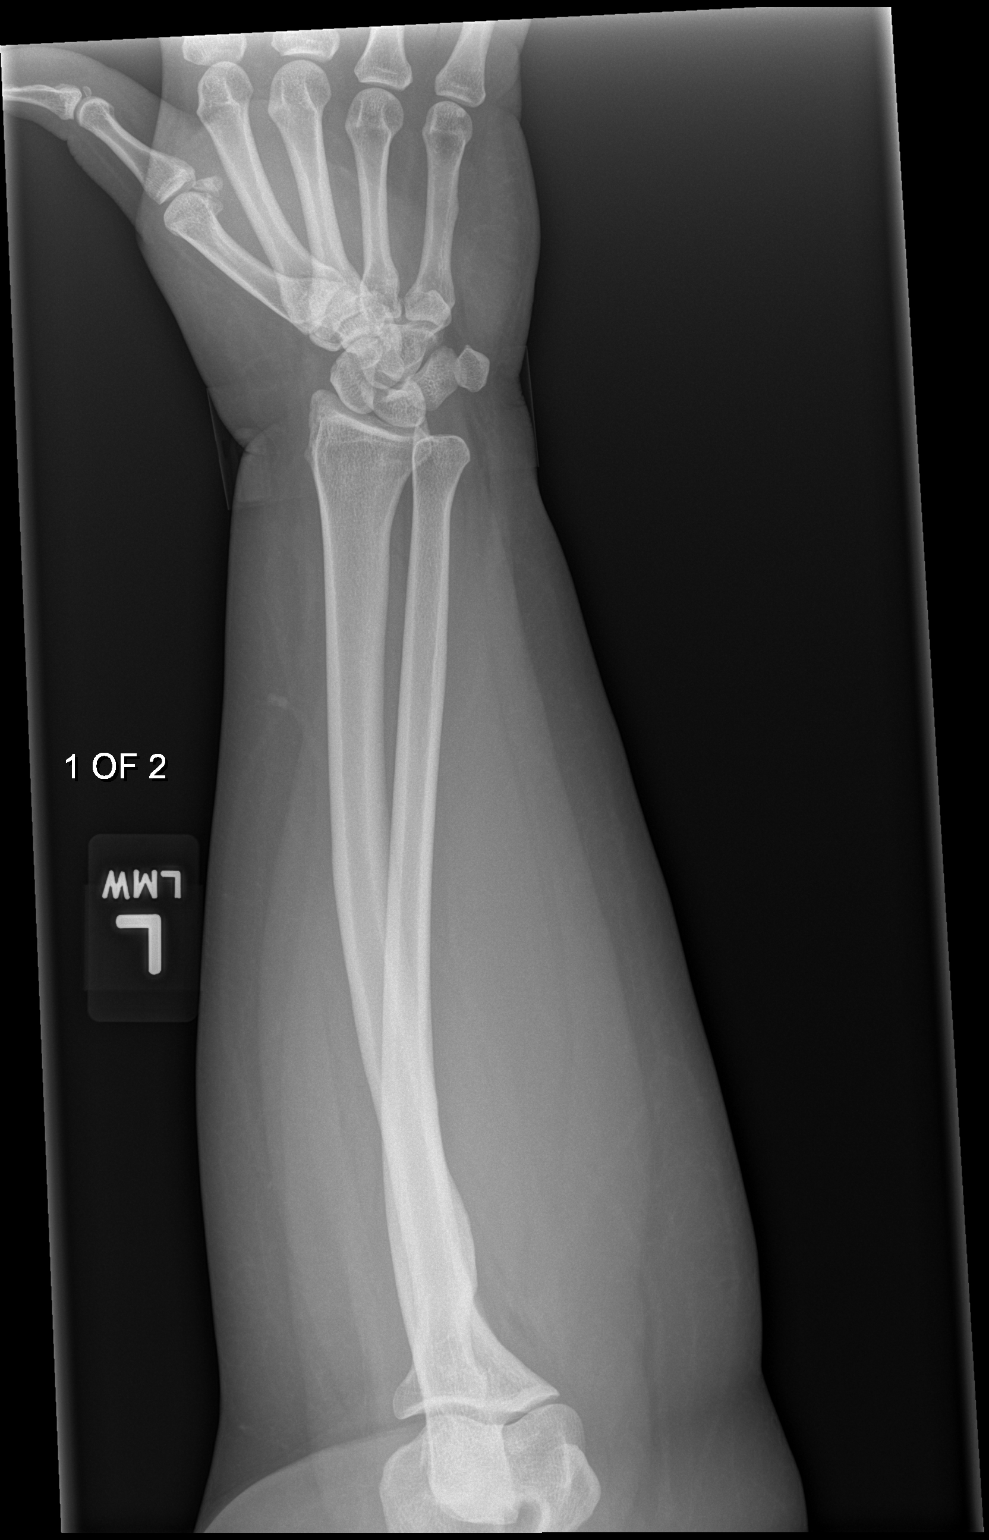
[im 2/3]
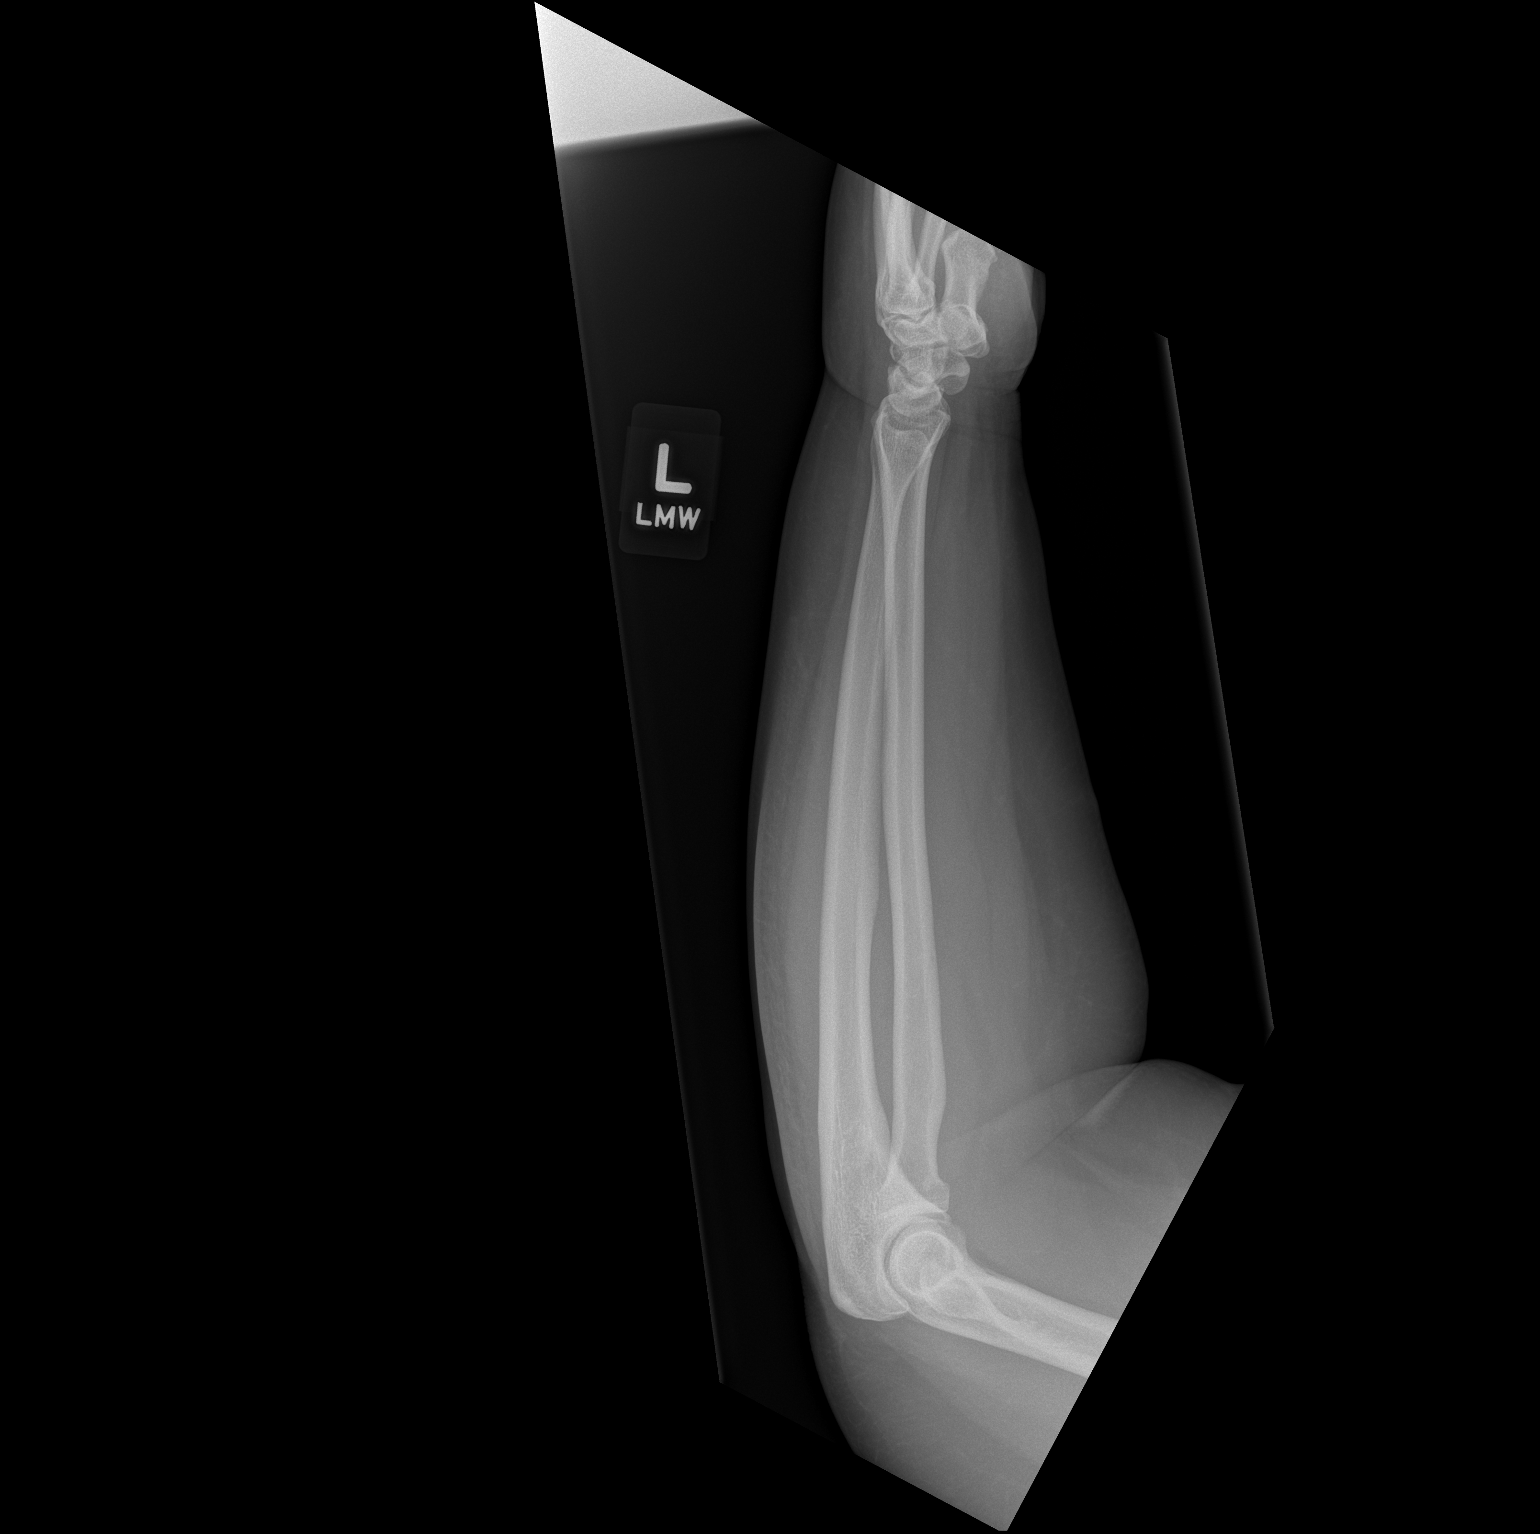
[im 3/3]
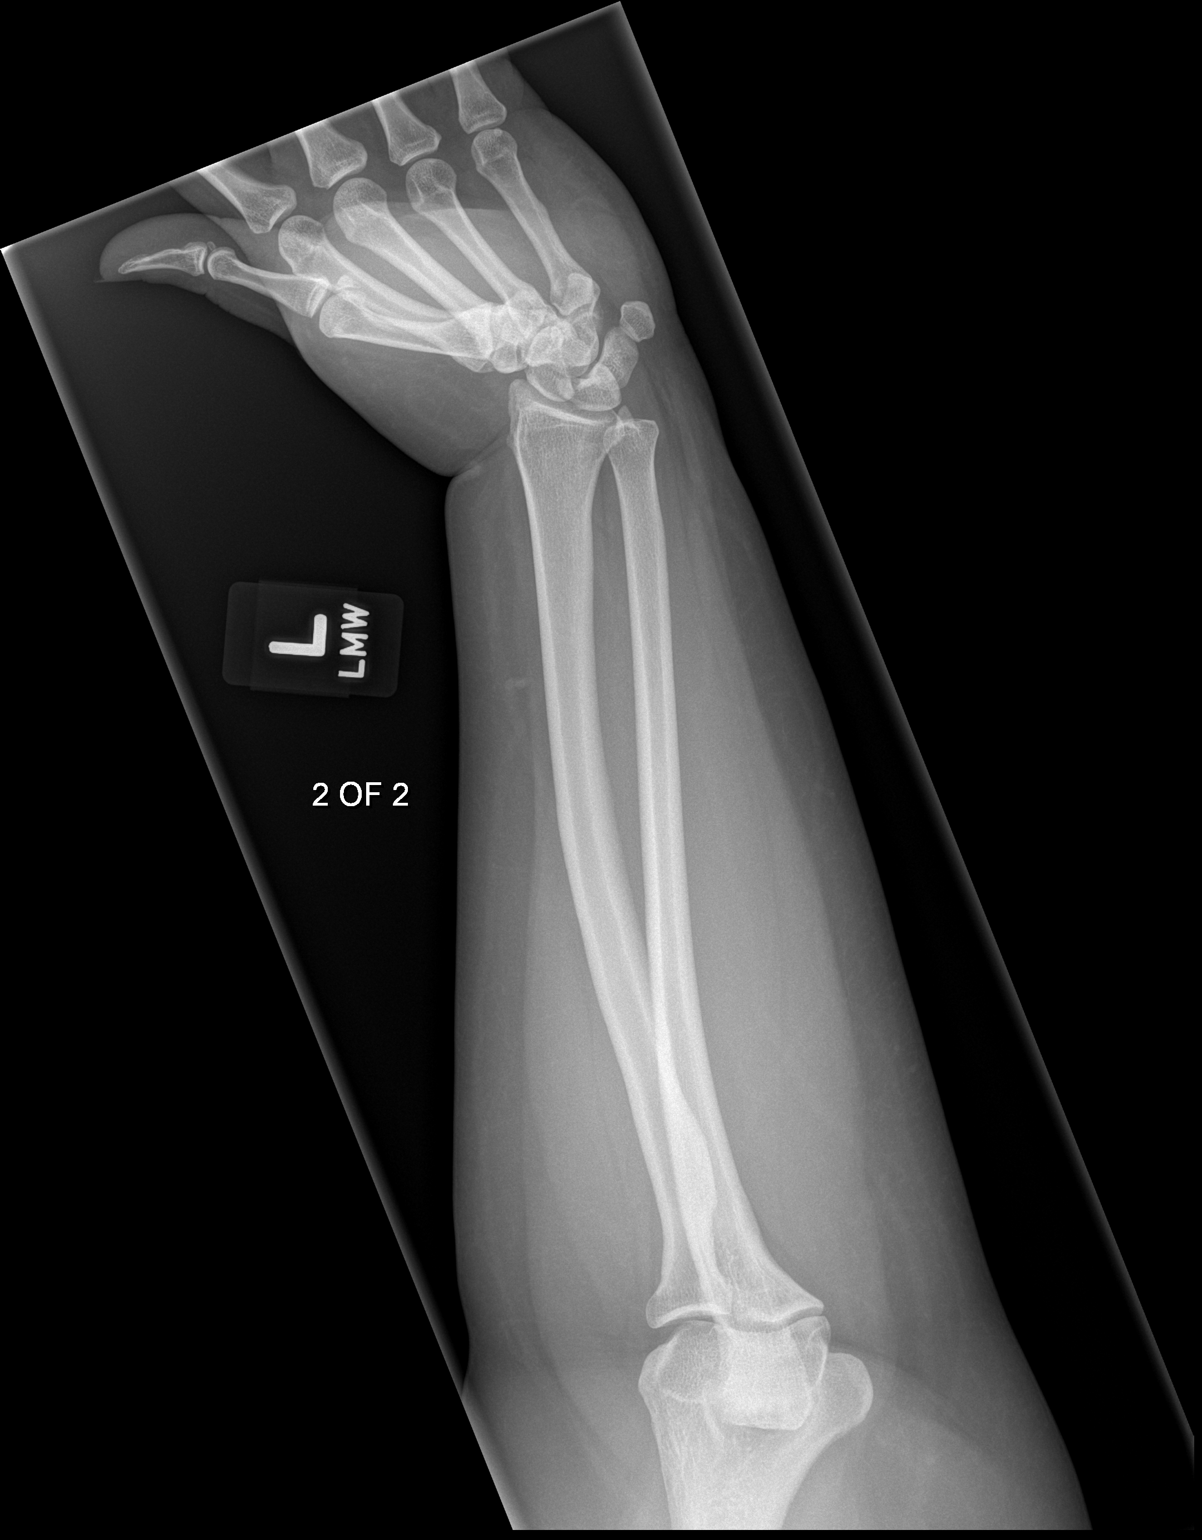

[3 of 3 positions shown; findings below may reference images not displayed]

IMPRESSION: 1. No evidence of acute abnormalities.
2. If there are persistent complaints of pain or persistent clinical
concern, a repeat evaluation in 7-10 days is recommended if clinically
warranted.

## 2014-08-24 ENCOUNTER — Emergency Department
Admission: EM | Admit: 2014-08-24 | Discharge: 2014-08-24 | Disposition: A | Discharge status: Home / Self Care | Payer: Self-pay | Attending: Emergency Medicine | Admitting: Emergency Medicine

## 2014-08-24 ENCOUNTER — Encounter: Payer: Self-pay | Admitting: *Deleted

## 2014-08-24 DIAGNOSIS — R51 Headache: Secondary | ICD-10-CM | POA: Insufficient documentation

## 2014-08-24 DIAGNOSIS — R519 Headache, unspecified: Secondary | ICD-10-CM

## 2014-08-24 LAB — BASIC METABOLIC PANEL
Anion gap: 7 (ref 5–15)
BUN: 10 mg/dL (ref 6–20)
CO2: 27 mmol/L (ref 22–32)
Calcium: 9 mg/dL (ref 8.9–10.3)
Chloride: 102 mmol/L (ref 101–111)
Creatinine, Ser: 0.73 mg/dL (ref 0.44–1.00)
GFR calc Af Amer: >60 mL/min (ref >60)
GFR calc non Af Amer: >60 mL/min (ref >60)
Glucose, Bld: 110 mg/dL — ABNORMAL HIGH (ref 65–99)
Potassium: 3.7 mmol/L (ref 3.5–5.1)
Sodium: 136 mmol/L (ref 135–145)

## 2014-08-24 LAB — CBC WITH DIFFERENTIAL/PLATELET
Basophils Absolute: 0 10*3/uL (ref 0–0.1)
Basophils Relative: 1 %
Eosinophils Absolute: 0.3 10*3/uL (ref 0–0.7)
Eosinophils Relative: 3 %
HCT: 27.8 % — ABNORMAL LOW (ref 35.0–47.0)
Hemoglobin: 8.9 g/dL — ABNORMAL LOW (ref 12.0–16.0)
Lymphocytes Relative: 37 %
Lymphs Abs: 3.2 10*3/uL (ref 1.0–3.6)
MCH: 17.7 pg — ABNORMAL LOW (ref 26.0–34.0)
MCHC: 31.8 g/dL — ABNORMAL LOW (ref 32.0–36.0)
MCV: 55.7 fL — ABNORMAL LOW (ref 80.0–100.0)
Monocytes Absolute: 0.5 10*3/uL (ref 0.2–0.9)
Monocytes Relative: 6 %
Neutro Abs: 4.6 10*3/uL (ref 1.4–6.5)
Neutrophils Relative %: 53 %
Platelets: 512 10*3/uL — ABNORMAL HIGH (ref 150–440)
RBC: 5 MIL/uL (ref 3.80–5.20)
RDW: 21.3 % — ABNORMAL HIGH (ref 11.5–14.5)
WBC: 8.6 10*3/uL (ref 3.6–11.0)

## 2014-08-24 MED ORDER — KETOROLAC TROMETHAMINE 30 MG/ML IJ SOLN
INTRAMUSCULAR | Status: AC
  Filled 2014-08-24: qty 1

## 2014-08-24 MED ORDER — BUTALBITAL-APAP-CAFFEINE 50-325-40 MG PO TABS: 1.0000 | ORAL_TABLET | Freq: Four times a day (QID) | ORAL | Status: DC | PRN

## 2014-08-24 MED ORDER — KETOROLAC TROMETHAMINE 30 MG/ML IJ SOLN
30.0000 mg | Freq: Once | INTRAMUSCULAR | Status: AC
  Administered 2014-08-24: 30 mg via INTRAVENOUS

## 2014-08-24 NOTE — ED Notes (Signed)
Pt has a frontal headache and pain over right eye. Sx for 2 days.  No n/v/d.  Pt alert   Speech clear.  No otc meds today for pain relief

## 2014-08-24 NOTE — ED Provider Notes (Signed)
[facility] Emergency Department Provider Note  ____________________________________________  Time seen: Approximately 8:16 PM  I have reviewed the triage vital signs and the nursing notes.   HISTORY  Chief Complaint Headache    HPI Jean Morris is a 39 y.o. female who presents for evaluation of a frontal headache for 2 days. Patient states that she's not taking any medication for this with a history of the same. Reports pain is 8/10 at this time denies any nausea or vomiting, and denies any photo/phonophobia.   No past medical history on file.  There are no active problems to display for this patient.   No past surgical history on file.  Current Outpatient Rx  Name  Route  Sig  Dispense  Refill  . butalbital-acetaminophen-caffeine (FIORICET) 50-325-40 MG per tablet   Oral   Take 1-2 tablets by mouth every 6 (six) hours as needed for headache.   20 tablet   0     Allergies Review of patient's allergies indicates no known allergies.  No family history on file.  Social History History  Substance Use Topics  . Smoking status: Never Smoker   . Smokeless tobacco: Not on file  . Alcohol Use: No    Review of Systems Constitutional: No fever/chills Eyes: No visual changes. ENT: No sore throat. Cardiovascular: Denies chest pain. Respiratory: Denies shortness of breath. Gastrointestinal: No abdominal pain.  No nausea, no vomiting.  No diarrhea.  No constipation. Genitourinary: Negative for dysuria. Musculoskeletal: Negative for back pain. Skin: Negative for rash. Neurological: Negative for headaches, focal weakness or numbness.  10-point ROS otherwise negative.  ____________________________________________   PHYSICAL EXAM:  VITAL SIGNS: ED Triage Vitals  Enc Vitals Group     BP 08/24/14 1929 147/75 mmHg     Pulse Rate 08/24/14 1929 106     Resp 08/24/14 1929 20     Temp 08/24/14 1929 98.4 F (36.9 C)     Temp Source 08/24/14  1929 Oral     SpO2 08/24/14 1929 100 %     Weight 08/24/14 1929 374 lb (169.645 kg)     Height 08/24/14 1929 5' 5"  (1.651 m)     Head Cir --      Peak Flow --      Pain Score 08/24/14 1929 8     Pain Loc --      Pain Edu? --      Excl. in [place]? --     Constitutional: Alert and oriented. Well appearing and in no acute distress. Eyes: Conjunctivae are normal. PERRL. EOMI. Head: Atraumatic. Nose: No congestion/rhinnorhea. Mouth/Throat: Mucous membranes are moist.  Oropharynx non-erythematous. Neck: No stridor.   Cardiovascular: Normal rate, regular rhythm. Grossly normal heart sounds.  Good peripheral circulation. Respiratory: Normal respiratory effort.  No retractions. Lungs CTAB. Musculoskeletal: No lower extremity tenderness nor edema.  No joint effusions. Neurologic:  Normal speech and language. No gross focal neurologic deficits are appreciated. Speech is normal. No gait instability. Skin:  Skin is warm, dry and intact. No rash noted. Psychiatric: Mood and affect are normal. Speech and behavior are normal.  ____________________________________________   LABS (all labs ordered are listed, but only abnormal results are displayed)  Labs Reviewed  CBC WITH DIFFERENTIAL/PLATELET - Abnormal; Notable for the following:    Hemoglobin 8.9 (*)    HCT 27.8 (*)    MCV 55.7 (*)    MCH 17.7 (*)    MCHC 31.8 (*)    RDW 21.3 (*)  Platelets 512 (*)    All other components within normal limits  BASIC METABOLIC PANEL - Abnormal; Notable for the following:    Glucose, Bld 110 (*)    All other components within normal limits   ____________________________________________  EKG  Not applicable ____________________________________________  RADIOLOGY Deferred ____________________________________________   PROCEDURES  Procedure(s) performed: None  Critical Care performed: No  ____________________________________________   INITIAL IMPRESSION / ASSESSMENT AND PLAN / ED  COURSE  Pertinent labs & imaging results that were available during my care of the patient were reviewed by me and considered in my medical decision making (see chart for details).  Toradol 30 mg IV given with significant relief. Patient reports pain down to 3/10 at discharge. Patient given prescription for Fioricet #12 and  to follow up with PCP. She voices no other complaints at this time ____________________________________________   FINAL CLINICAL IMPRESSION(S) / ED DIAGNOSES  Final diagnoses:  Headache disorder      Arlyss Repress, PA-C 08/24/14 [id number]  Earleen Newport, MD 09/04/14 [phone]

## 2014-08-24 NOTE — Discharge Instructions (Signed)

## 2014-09-24 ENCOUNTER — Emergency Department
Admission: EM | Admit: 2014-09-24 | Discharge: 2014-09-24 | Disposition: A | Discharge status: Home / Self Care | Payer: No Typology Code available for payment source | Attending: Emergency Medicine | Admitting: Emergency Medicine

## 2014-09-24 ENCOUNTER — Encounter: Payer: Self-pay | Admitting: Emergency Medicine

## 2014-09-24 ENCOUNTER — Emergency Department: Payer: No Typology Code available for payment source

## 2014-09-24 DIAGNOSIS — S161XXA Strain of muscle, fascia and tendon at neck level, initial encounter: Secondary | ICD-10-CM | POA: Diagnosis not present

## 2014-09-24 DIAGNOSIS — S199XXA Unspecified injury of neck, initial encounter: Secondary | ICD-10-CM | POA: Diagnosis present

## 2014-09-24 DIAGNOSIS — Y9389 Activity, other specified: Secondary | ICD-10-CM | POA: Insufficient documentation

## 2014-09-24 DIAGNOSIS — S79912A Unspecified injury of left hip, initial encounter: Secondary | ICD-10-CM | POA: Insufficient documentation

## 2014-09-24 DIAGNOSIS — Y92410 Unspecified street and highway as the place of occurrence of the external cause: Secondary | ICD-10-CM | POA: Diagnosis not present

## 2014-09-24 DIAGNOSIS — S96912A Strain of unspecified muscle and tendon at ankle and foot level, left foot, initial encounter: Secondary | ICD-10-CM | POA: Insufficient documentation

## 2014-09-24 DIAGNOSIS — M25552 Pain in left hip: Secondary | ICD-10-CM

## 2014-09-24 DIAGNOSIS — Y998 Other external cause status: Secondary | ICD-10-CM | POA: Insufficient documentation

## 2014-09-24 MED ORDER — CYCLOBENZAPRINE HCL 10 MG PO TABS
10.0000 mg | ORAL_TABLET | Freq: Once | ORAL | Status: AC
  Administered 2014-09-24: 10 mg via ORAL
  Filled 2014-09-24: qty 1

## 2014-09-24 MED ORDER — IBUPROFEN 800 MG PO TABS: 800.0000 mg | ORAL_TABLET | Freq: Three times a day (TID) | ORAL | Status: DC | PRN

## 2014-09-24 MED ORDER — CYCLOBENZAPRINE HCL 10 MG PO TABS: 10.0000 mg | ORAL_TABLET | Freq: Three times a day (TID) | ORAL | Status: DC | PRN

## 2014-09-24 MED ORDER — IBUPROFEN 800 MG PO TABS
800.0000 mg | ORAL_TABLET | Freq: Once | ORAL | Status: AC
  Administered 2014-09-24: 800 mg via ORAL
  Filled 2014-09-24: qty 1
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
## 2014-09-24 NOTE — Discharge Instructions (Signed)
Ankle Sprain An ankle sprain is an injury to the strong, fibrous tissues (ligaments) that hold the bones of your ankle joint together.  CAUSES An ankle sprain is usually caused by a fall or by twisting your ankle. Ankle sprains most commonly occur when you step on the outer edge of your foot, and your ankle turns inward. People who participate in sports are more prone to these types of injuries.  SYMPTOMS   Pain in your ankle. The pain may be present at rest or only when you are trying to stand or walk.  Swelling.  Bruising. Bruising may develop immediately or within 1 to 2 days after your injury.  Difficulty standing or walking, particularly when turning corners or changing directions. DIAGNOSIS  Your caregiver will ask you details about your injury and perform a physical exam of your ankle to determine if you have an ankle sprain. During the physical exam, your caregiver will press on and apply pressure to specific areas of your foot and ankle. Your caregiver will try to move your ankle in certain ways. An X-ray exam may be done to be sure a bone was not broken or a ligament did not separate from one of the bones in your ankle (avulsion fracture).  TREATMENT  Certain types of braces can help stabilize your ankle. Your caregiver can make a recommendation for this. Your caregiver may recommend the use of medicine for pain. If your sprain is severe, your caregiver may refer you to a surgeon who helps to restore function to parts of your skeletal system (orthopedist) or a physical therapist. [place] ice to your injury for 1-2 days or as directed by your caregiver. Applying ice helps to reduce inflammation and pain.  Put ice in a plastic bag.  Place a towel between your skin and the bag.  Leave the ice on for 15-20 minutes at a time, every 2 hours while you are awake.  Only take over-the-counter or prescription medicines for pain, discomfort, or fever as directed by  your caregiver.  Elevate your injured ankle above the level of your heart as much as possible for 2-3 days.  If your caregiver recommends crutches, use them as instructed. Gradually put weight on the affected ankle. Continue to use crutches or a cane until you can walk without feeling pain in your ankle.  If you have a plaster splint, wear the splint as directed by your caregiver. Do not rest it on anything harder than a pillow for the first 24 hours. Do not put weight on it. Do not get it wet. You may take it off to take a shower or bath.  You may have been given an elastic bandage to wear around your ankle to provide support. If the elastic bandage is too tight (you have numbness or tingling in your foot or your foot becomes cold and blue), adjust the bandage to make it comfortable.  If you have an air splint, you may blow more air into it or let air out to make it more comfortable. You may take your splint off at night and before taking a shower or bath. Wiggle your toes in the splint several times per day to decrease swelling. SEEK MEDICAL CARE IF:   You have rapidly increasing bruising or swelling.  Your toes feel extremely cold or you lose feeling in your foot.  Your pain is not relieved with medicine. SEEK IMMEDIATE MEDICAL CARE IF:  Your toes are numb or blue.  You have severe pain that is increasing. MAKE SURE YOU:   Understand these instructions.  Will watch your condition.  Will get help right away if you are not doing well or get worse. Document Released: 02/10/2005 Document Revised: 11/05/2011 Document Reviewed: 02/22/2011 [facility] Patient Information 2015 [place], [place]. This information is not intended to replace advice given to you by your health care provider. Make sure you discuss any questions you have with your health care provider.  Cervical Sprain A cervical sprain is an injury in the neck in which the strong, fibrous tissues (ligaments) that connect your neck  bones stretch or tear. Cervical sprains can range from mild to severe. Severe cervical sprains can cause the neck vertebrae to be unstable. This can lead to damage of the spinal cord and can result in serious nervous system problems. The amount of time it takes for a cervical sprain to get better depends on the cause and extent of the injury. Most cervical sprains heal in 1 to 3 weeks. CAUSES  Severe cervical sprains may be caused by:   Contact sport injuries (such as from football, rugby, wrestling, hockey, auto racing, gymnastics, diving, martial arts, or boxing).   Motor vehicle collisions.   Whiplash injuries. This is an injury from a sudden forward and backward whipping movement of the head and neck.  Falls.  Mild cervical sprains may be caused by:   Being in an awkward position, such as while cradling a telephone between your ear and shoulder.   Sitting in a chair that does not offer proper support.   Working at a poorly Landscape [employer] station.   Looking up or down for long periods of time.  SYMPTOMS   Pain, soreness, stiffness, or a burning sensation in the front, back, or sides of the neck. This discomfort may develop immediately after the injury or slowly, 24 hours or more after the injury.   Pain or tenderness directly in the middle of the back of the neck.   Shoulder or upper back pain.   Limited ability to move the neck.   Headache.   Dizziness.   Weakness, numbness, or tingling in the hands or arms.   Muscle spasms.   Difficulty swallowing or chewing.   Tenderness and swelling of the neck.  DIAGNOSIS  Most of the time your health care provider can diagnose a cervical sprain by taking your history and doing a physical exam. Your health care provider will ask about previous neck injuries and any known neck problems, such as arthritis in the neck. X-rays may be taken to find out if there are any other problems, such as with the bones of the  neck. Other tests, such as a CT scan or MRI, may also be needed.  TREATMENT  Treatment depends on the severity of the cervical sprain. Mild sprains can be treated with rest, keeping the neck in place (immobilization), and pain medicines. Severe cervical sprains are immediately immobilized. Further treatment is done to help with pain, muscle spasms, and other symptoms and may include:  Medicines, such as pain relievers, numbing medicines, or muscle relaxants.   Physical therapy. This may involve stretching exercises, strengthening exercises, and posture training. Exercises and improved posture can help stabilize the neck, strengthen muscles, and help stop symptoms from returning.  HOME CARE INSTRUCTIONS   Put ice on the injured area.   Put ice in a plastic bag.   Place a towel between your skin and the bag.   Leave the ice  on for 15-20 minutes, 3-4 times a day.   If your injury was severe, you may have been given a cervical collar to wear. A cervical collar is a two-piece collar designed to keep your neck from moving while it heals.  Do not remove the collar unless instructed by your health care provider.  If you have long hair, keep it outside of the collar.  Ask your health care provider before making any adjustments to your collar. Minor adjustments may be required over time to improve comfort and reduce pressure on your chin or on the back of your head.  Ifyou are allowed to remove the collar for cleaning or bathing, follow your health care provider's instructions on how to do so safely.  Keep your collar clean by wiping it with mild soap and water and drying it completely. If the collar you have been given includes removable pads, remove them every 1-2 days and hand wash them with soap and water. Allow them to air dry. They should be completely dry before you wear them in the collar.  If you are allowed to remove the collar for cleaning and bathing, wash and dry the skin of  your neck. Check your skin for irritation or sores. If you see any, tell your health care provider.  Do not drive while wearing the collar.   Only take over-the-counter or prescription medicines for pain, discomfort, or fever as directed by your health care provider.   Keep all follow-up appointments as directed by your health care provider.   Keep all physical therapy appointments as directed by your health care provider.   Make any needed adjustments to your workstation to promote good posture.   Avoid positions and activities that make your symptoms worse.   Warm up and stretch before being active to help prevent problems.  SEEK MEDICAL CARE IF:   Your pain is not controlled with medicine.   You are unable to decrease your pain medicine over time as planned.   Your activity level is not improving as expected.  SEEK IMMEDIATE MEDICAL CARE IF:   You develop any bleeding.  You develop stomach upset.  You have signs of an allergic reaction to your medicine.   Your symptoms get worse.   You develop new, unexplained symptoms.   You have numbness, tingling, weakness, or paralysis in any part of your body.  MAKE SURE YOU:   Understand these instructions.  Will watch your condition.  Will get help right away if you are not doing well or get worse. Document Released: 12/08/2006 Document Revised: 02/15/2013 Document Reviewed: 08/18/2012 [facility] Patient Information 2015 [place], [place]. This information is not intended to replace advice given to you by your health care provider. Make sure you discuss any questions you have with your health care provider.  Motor Vehicle Collision It is common to have multiple bruises and sore muscles after a motor vehicle collision (MVC). These tend to feel worse for the first 24 hours. You may have the most stiffness and soreness over the first several hours. You may also feel worse when you wake up the first morning after your  collision. After this point, you will usually begin to improve with each day. The speed of improvement often depends on the severity of the collision, the number of injuries, and the location and nature of these injuries. HOME CARE INSTRUCTIONS  Put ice on the injured area.  Put ice in a plastic bag.  Place a towel between your skin  and the bag.  Leave the ice on for 15-20 minutes, 3-4 times a day, or as directed by your health care provider.  Drink enough fluids to keep your urine clear or pale yellow. Do not drink alcohol.  Take a warm shower or bath once or twice a day. This will increase blood flow to sore muscles.  You may return to activities as directed by your caregiver. Be careful when lifting, as this may aggravate neck or back pain.  Only take over-the-counter or prescription medicines for pain, discomfort, or fever as directed by your caregiver. Do not use aspirin. This may increase bruising and bleeding. SEEK IMMEDIATE MEDICAL CARE IF:  You have numbness, tingling, or weakness in the arms or legs.  You develop severe headaches not relieved with medicine.  You have severe neck pain, especially tenderness in the middle of the back of your neck.  You have changes in bowel or bladder control.  There is increasing pain in any area of the body.  You have shortness of breath, light-headedness, dizziness, or fainting.  You have chest pain.  You feel sick to your stomach (nauseous), throw up (vomit), or sweat.  You have increasing abdominal discomfort.  There is blood in your urine, stool, or vomit.  You have pain in your shoulder (shoulder strap areas).  You feel your symptoms are getting worse. MAKE SURE YOU:  Understand these instructions.  Will watch your condition.  Will get help right away if you are not doing well or get worse. Document Released: 02/10/2005 Document Revised: 06/27/2013 Document Reviewed: 07/10/2010 [facility] Patient Information 2015  [place], [place]. This information is not intended to replace advice given to you by your health care provider. Make sure you discuss any questions you have with your health care provider.
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
## 2015-01-25 ENCOUNTER — Emergency Department
Admission: EM | Admit: 2015-01-25 | Discharge: 2015-01-25 | Disposition: A | Discharge status: Home / Self Care | Payer: Self-pay | Attending: Emergency Medicine | Admitting: Emergency Medicine

## 2015-01-25 ENCOUNTER — Encounter: Payer: Self-pay | Admitting: Emergency Medicine

## 2015-01-25 DIAGNOSIS — G43009 Migraine without aura, not intractable, without status migrainosus: Secondary | ICD-10-CM

## 2015-01-25 DIAGNOSIS — N939 Abnormal uterine and vaginal bleeding, unspecified: Secondary | ICD-10-CM | POA: Insufficient documentation

## 2015-01-25 DIAGNOSIS — N921 Excessive and frequent menstruation with irregular cycle: Secondary | ICD-10-CM | POA: Insufficient documentation

## 2015-01-25 DIAGNOSIS — Z3202 Encounter for pregnancy test, result negative: Secondary | ICD-10-CM | POA: Insufficient documentation

## 2015-01-25 DIAGNOSIS — G43909 Migraine, unspecified, not intractable, without status migrainosus: Secondary | ICD-10-CM | POA: Insufficient documentation

## 2015-01-25 LAB — URINALYSIS COMPLETE WITH MICROSCOPIC (ARMC ONLY)
Bilirubin Urine: NEGATIVE
Glucose, UA: NEGATIVE mg/dL
Ketones, ur: NEGATIVE mg/dL
Leukocytes, UA: NEGATIVE
Nitrite: NEGATIVE
Protein, ur: NEGATIVE mg/dL
Specific Gravity, Urine: 1.024 (ref 1.005–1.030)
pH: 6 (ref 5.0–8.0)

## 2015-01-25 LAB — CBC
HCT: 31.4 % — ABNORMAL LOW (ref 35.0–47.0)
Hemoglobin: 9.8 g/dL — ABNORMAL LOW (ref 12.0–16.0)
MCH: 17.9 pg — ABNORMAL LOW (ref 26.0–34.0)
MCHC: 31.3 g/dL — ABNORMAL LOW (ref 32.0–36.0)
MCV: 57.2 fL — ABNORMAL LOW (ref 80.0–100.0)
Platelets: 519 10*3/uL — ABNORMAL HIGH (ref 150–440)
RBC: 5.48 MIL/uL — ABNORMAL HIGH (ref 3.80–5.20)
RDW: 20.8 % — ABNORMAL HIGH (ref 11.5–14.5)
WBC: 7.3 10*3/uL (ref 3.6–11.0)

## 2015-01-25 LAB — COMPREHENSIVE METABOLIC PANEL
ALT: 12 U/L — ABNORMAL LOW (ref 14–54)
AST: 14 U/L — ABNORMAL LOW (ref 15–41)
Albumin: 3.8 g/dL (ref 3.5–5.0)
Alkaline Phosphatase: 74 U/L (ref 38–126)
Anion gap: 5 (ref 5–15)
BUN: 14 mg/dL (ref 6–20)
CO2: 27 mmol/L (ref 22–32)
Calcium: 9.1 mg/dL (ref 8.9–10.3)
Chloride: 104 mmol/L (ref 101–111)
Creatinine, Ser: 0.75 mg/dL (ref 0.44–1.00)
GFR calc Af Amer: >60 mL/min (ref >60)
GFR calc non Af Amer: >60 mL/min (ref >60)
Glucose, Bld: 95 mg/dL (ref 65–99)
Potassium: 3.7 mmol/L (ref 3.5–5.1)
Sodium: 136 mmol/L (ref 135–145)
Total Bilirubin: 0.3 mg/dL (ref 0.3–1.2)
Total Protein: 9.1 g/dL — ABNORMAL HIGH (ref 6.5–8.1)

## 2015-01-25 LAB — LIPASE, BLOOD: Lipase: 33 U/L (ref 11–51)

## 2015-01-25 LAB — POCT PREGNANCY, URINE: Preg Test, Ur: NEGATIVE

## 2015-01-25 MED ORDER — FERROUS SULFATE 325 (65 FE) MG PO TABS: 325.0000 mg | ORAL_TABLET | Freq: Every day | ORAL | Status: DC

## 2015-01-25 MED ORDER — SODIUM CHLORIDE 0.9 % IV BOLUS (SEPSIS)
1000.0000 mL | Freq: Once | INTRAVENOUS | Status: AC
Start: 2015-01-25 — End: 2015-01-25
  Administered 2015-01-25: 1000 mL via INTRAVENOUS

## 2015-01-25 MED ORDER — BUTALBITAL-APAP-CAFFEINE 50-325-40 MG PO TABS: 1.0000 | ORAL_TABLET | Freq: Four times a day (QID) | ORAL | Status: DC | PRN

## 2015-01-25 MED ORDER — BUTALBITAL-APAP-CAFFEINE 50-325-40 MG PO TABS
1.0000 | ORAL_TABLET | Freq: Once | ORAL | Status: AC
  Administered 2015-01-25: 1 via ORAL
  Filled 2015-01-25: qty 1

## 2015-01-25 MED ORDER — PROMETHAZINE HCL 25 MG/ML IJ SOLN
12.5000 mg | Freq: Once | INTRAMUSCULAR | Status: AC
  Administered 2015-01-25: 12.5 mg via INTRAVENOUS
  Filled 2015-01-25: qty 1

## 2015-01-25 MED ORDER — NORGESTIM-ETH ESTRAD TRIPHASIC 0.18/0.215/0.25 MG-25 MCG PO TABS: 1.0000 | ORAL_TABLET | Freq: Every day | ORAL | Status: DC

## 2015-01-25 NOTE — ED Notes (Signed)
Patient presents to the ED with a headache that started about 2 days ago.  Patient states it feels similar to previous headaches.  Patient reports menstrual bleeding x 1.5 months and states this is very abnormal for her.  Patient states about 2 years ago she had similar symptoms and she had to have a blood transfusion.  Patient reports intermittent abdominal cramping as well.

## 2015-01-25 NOTE — ED Provider Notes (Signed)
CSN: [id number]     Arrival date & time 01/25/15  6433 History   First MD Initiated Contact with Patient 01/25/15 [phone]     Chief Complaint  Patient presents with  . Headache  . Abdominal Cramping     (Consider location/radiation/quality/duration/timing/severity/associated sxs/prior Treatment) The history is provided by the patient.  Dorothia L Pherigo is a 39 y.o. female who presenting with normal pain, vaginal bleeding, headaches. Patient states that she is has been having vaginal bleeding over the last month and a half. Has been bleeding about 4 pads a day. Also has intermittent left flank pain. Denies any constipation or diarrhea or vomiting. Denies any chest pain or shortness of breath. Had headache that is intermittent since yesterday. Denies any weakness or nausea vomiting from it. States that this is similar from her previous Headaches. Patient is not currently on any birth control to control her menses. Patient denies any vaginal discharge and was not concerned for STDs.     History reviewed. No pertinent past medical history. Past Surgical History  Procedure Laterality Date  . Cesarean section  1994   No family history on file. Social History  Substance Use Topics  . Smoking status: Never Smoker   . Smokeless tobacco: None  . Alcohol Use: No   OB History    No data available     Review of Systems  Genitourinary: Positive for vaginal bleeding.  Neurological: Positive for headaches.  All other systems reviewed and are negative.     Allergies  Review of patient's allergies indicates no known allergies.  Home Medications   Prior to Admission medications   Medication Sig Start Date End Date Taking? Authorizing Provider  butalbital-acetaminophen-caffeine (FIORICET) 50-325-40 MG per tablet Take 1-2 tablets by mouth every 6 (six) hours as needed for headache. 08/24/14 08/24/15  Arlyss Repress, PA-C  cyclobenzaprine (FLEXERIL) 10 MG tablet Take 1 tablet (10 mg total) by  mouth every 8 (eight) hours as needed for muscle spasms. 09/24/14   Pierce Crane Beers, PA-C  ibuprofen (ADVIL,MOTRIN) 800 MG tablet Take 1 tablet (800 mg total) by mouth every 8 (eight) hours as needed. 09/24/14   Pierce Crane Beers, PA-C   BP 127/93 mmHg  Pulse 71  Temp(Src) 97.9 F (36.6 C) (Oral)  Resp 20  Ht 5' 5"  (1.651 m)  Wt 382 lb (173.274 kg)  BMI 63.57 kg/m2  SpO2 99%  LMP 12/13/2014 (Approximate) Physical Exam  Constitutional: She is oriented to person, place, and time.  Overweight   HENT:  Head: Normocephalic.  Mouth/Throat: Oropharynx is clear and moist.  Eyes: Conjunctivae are normal. Pupils are equal, round, and reactive to light.  Neck: Normal range of motion. Neck supple.  Cardiovascular: Normal rate, regular rhythm and normal heart sounds.   Pulmonary/Chest: Effort normal and breath sounds normal. No respiratory distress. She has no wheezes. She has no rales.  Abdominal: Soft. Bowel sounds are normal. She exhibits no distension. There is no tenderness. There is no rebound and no guarding.  Musculoskeletal: Normal range of motion. She exhibits no edema or tenderness.  Neurological: She is alert and oriented to person, place, and time. No cranial nerve deficit. Coordination normal.  Skin: Skin is warm and dry.  Psychiatric: She has a normal mood and affect. Her behavior is normal. Judgment and thought content normal.  Nursing note and vitals reviewed.   ED Course  Procedures (including critical care time) Labs Review Labs Reviewed  COMPREHENSIVE METABOLIC PANEL - Abnormal; Notable for the  following:    Total Protein 9.1 (*)    AST 14 (*)    ALT 12 (*)    All other components within normal limits  CBC - Abnormal; Notable for the following:    RBC 5.48 (*)    Hemoglobin 9.8 (*)    HCT 31.4 (*)    MCV 57.2 (*)    MCH 17.9 (*)    MCHC 31.3 (*)    RDW 20.8 (*)    Platelets 519 (*)    All other components within normal limits  URINALYSIS COMPLETEWITH MICROSCOPIC  ([facility] ONLY) - Abnormal; Notable for the following:    Color, Urine YELLOW (*)    APPearance CLOUDY (*)    Hgb urine dipstick 3+ (*)    Bacteria, UA RARE (*)    Squamous Epithelial / LPF 0-5 (*)    All other components within normal limits  LIPASE, BLOOD  PREGNANCY, URINE  POC URINE PREG, ED  POCT PREGNANCY, URINE    Imaging Review No results found. I have personally reviewed and evaluated these images and lab results as part of my medical decision-making.   EKG Interpretation None      MDM   Final diagnoses:  None   Dehlia L Coggeshall is a 39 y.o. female here with headaches, vaginal bleeding, flank pain. Headaches for 2 days, intermittent, has nl neuro exam. I think likely migraines. Has menorrhagia but stable vitals so will check cbc. Flank pain is intermittent and no CVAT or abdominal tenderness so will get chemistry and UA. Will give migraine cocktail and reassess.   11:15 AM Vitals stable. Hg 9.8 better than before. UA showed no UTI. Abdomen nontender. Will start on OCP to control bleeding. Will give iron pills and recommend GYN f/u. Headache improved with migraine cocktail.    Wandra Arthurs, MD 01/25/15 1116

## 2015-01-25 NOTE — Discharge Instructions (Signed)
Stay hydrated.   Take iron pills.  Take birth control to regular your cycle.   Take tylenol for headache and fioricet for severe headaches.   See GYN doctor.   Return to ER if you worse bleeding, severe pain, vomiting, passing out, chest pain.

## 2015-02-19 ENCOUNTER — Emergency Department: Payer: Self-pay

## 2015-02-19 ENCOUNTER — Emergency Department
Admission: EM | Admit: 2015-02-19 | Discharge: 2015-02-19 | Disposition: A | Discharge status: Home / Self Care | Payer: Self-pay | Attending: Emergency Medicine | Admitting: Emergency Medicine

## 2015-02-19 ENCOUNTER — Encounter: Payer: Self-pay | Admitting: Emergency Medicine

## 2015-02-19 DIAGNOSIS — N939 Abnormal uterine and vaginal bleeding, unspecified: Secondary | ICD-10-CM | POA: Insufficient documentation

## 2015-02-19 DIAGNOSIS — Z3202 Encounter for pregnancy test, result negative: Secondary | ICD-10-CM | POA: Insufficient documentation

## 2015-02-19 DIAGNOSIS — D649 Anemia, unspecified: Secondary | ICD-10-CM | POA: Insufficient documentation

## 2015-02-19 LAB — BASIC METABOLIC PANEL
Anion gap: 6 (ref 5–15)
BUN: 10 mg/dL (ref 6–20)
CO2: 25 mmol/L (ref 22–32)
Calcium: 8.5 mg/dL — ABNORMAL LOW (ref 8.9–10.3)
Chloride: 103 mmol/L (ref 101–111)
Creatinine, Ser: 0.67 mg/dL (ref 0.44–1.00)
GFR calc Af Amer: >60 mL/min (ref >60)
GFR calc non Af Amer: >60 mL/min (ref >60)
Glucose, Bld: 94 mg/dL (ref 65–99)
Potassium: 3.7 mmol/L (ref 3.5–5.1)
Sodium: 134 mmol/L — ABNORMAL LOW (ref 135–145)

## 2015-02-19 LAB — CBC
HCT: 26.5 % — ABNORMAL LOW (ref 35.0–47.0)
Hemoglobin: 8.7 g/dL — ABNORMAL LOW (ref 12.0–16.0)
MCH: 18.6 pg — ABNORMAL LOW (ref 26.0–34.0)
MCHC: 32.8 g/dL (ref 32.0–36.0)
MCV: 56.7 fL — ABNORMAL LOW (ref 80.0–100.0)
Platelets: 449 10*3/uL — ABNORMAL HIGH (ref 150–440)
RBC: 4.68 MIL/uL (ref 3.80–5.20)
RDW: 21.1 % — ABNORMAL HIGH (ref 11.5–14.5)
WBC: 9.1 10*3/uL (ref 3.6–11.0)

## 2015-02-19 LAB — URINALYSIS COMPLETE WITH MICROSCOPIC (ARMC ONLY)
Bacteria, UA: NONE SEEN
Bilirubin Urine: NEGATIVE
Glucose, UA: NEGATIVE mg/dL
Ketones, ur: NEGATIVE mg/dL
Nitrite: NEGATIVE
Protein, ur: NEGATIVE mg/dL
Specific Gravity, Urine: 1.02 (ref 1.005–1.030)
pH: 6 (ref 5.0–8.0)

## 2015-02-19 LAB — HCG, QUANTITATIVE, PREGNANCY: hCG, Beta Chain, Quant, S: 1 m[IU]/mL (ref <5)

## 2015-02-19 MED ORDER — OXYCODONE-ACETAMINOPHEN 5-325 MG PO TABS
ORAL_TABLET | ORAL | Status: AC
  Administered 2015-02-19: 2 via ORAL
  Filled 2015-02-19: qty 2

## 2015-02-19 MED ORDER — OXYCODONE-ACETAMINOPHEN 5-325 MG PO TABS
2.0000 | ORAL_TABLET | Freq: Once | ORAL | Status: AC
  Administered 2015-02-19: 2 via ORAL

## 2015-02-19 MED ORDER — ONDANSETRON HCL 4 MG PO TABS: 4.0000 mg | ORAL_TABLET | Freq: Every day | ORAL | Status: DC | PRN

## 2015-02-19 MED ORDER — OXYCODONE-ACETAMINOPHEN 5-325 MG PO TABS: 2.0000 | ORAL_TABLET | Freq: Four times a day (QID) | ORAL | Status: DC | PRN

## 2015-02-19 MED ORDER — FERROUS SULFATE DRIED ER 160 (50 FE) MG PO TBCR: 160.0000 mg | EXTENDED_RELEASE_TABLET | Freq: Every day | ORAL | Status: DC

## 2015-02-19 NOTE — Discharge Instructions (Signed)
Abnormal Uterine Bleeding  You will need a taper of your birth control medication. Take 4 tablets a day for two days, then 3 tablets a day for two days, then 2 tablets a day for two days day, then return to one tablet daily  Abnormal uterine bleeding can affect women at various stages in life, including teenagers, women in their reproductive years, pregnant women, and women who have reached menopause. Several kinds of uterine bleeding are considered abnormal, including:  Bleeding or spotting between periods.   Bleeding after sexual intercourse.   Bleeding that is heavier or more than normal.   Periods that last longer than usual.  Bleeding after menopause.  Many cases of abnormal uterine bleeding are minor and simple to treat, while others are more serious. Any type of abnormal bleeding should be evaluated by your health care provider. Treatment will depend on the cause of the bleeding. HOME CARE INSTRUCTIONS Monitor your condition for any changes. The following actions may help to alleviate any discomfort you are experiencing:  Avoid the use of tampons and douches as directed by your health care provider.  Change your pads frequently. You should get regular pelvic exams and Pap tests. Keep all follow-up appointments for diagnostic tests as directed by your health care provider.  SEEK MEDICAL CARE IF:   Your bleeding lasts more than 1 week.   You feel dizzy at times.  SEEK IMMEDIATE MEDICAL CARE IF:   You pass out.   You are changing pads every 15 to 30 minutes.   You have abdominal pain.  You have a fever.   You become sweaty or weak.   You are passing large blood clots from the vagina.   You start to feel nauseous and vomit. MAKE SURE YOU:   Understand these instructions.  Will watch your condition.  Will get help right away if you are not doing well or get worse.   This information is not intended to replace advice given to you by your health care  provider. Make sure you discuss any questions you have with your health care provider.   Document Released: 02/10/2005 Document Revised: 02/15/2013 Document Reviewed: 09/09/2012 Elsevier Interactive Patient Education [employer].

## 2015-02-19 NOTE — ED Provider Notes (Signed)
[facility] Emergency Department Provider Note     Time seen: ----------------------------------------- 10:02 AM on 02/19/2015 -----------------------------------------    I have reviewed the triage vital signs and the nursing notes.   HISTORY  Chief Complaint Vaginal Bleeding and Abdominal Pain    HPI Jean Morris is a 39 y.o. female who presents ER with heavy vaginal bleeding since November. Patient seen in the ER last week of December and was started on birth control stopped the bleeding. States the bleeding stopped for one day, bleeding for the past 2 weeks with mild abdominal pain and back pain. She occasionally feels weak tired and short of breath.   History reviewed. No pertinent past medical history.  There are no active problems to display for this patient.   Past Surgical History  Procedure Laterality Date  . Cesarean section  1994    Allergies Review of patient's allergies indicates no known allergies.  Social History Social History  Substance Use Topics  . Smoking status: Never Smoker   . Smokeless tobacco: None  . Alcohol Use: No    Review of Systems Constitutional: Negative for fever. Eyes: Negative for visual changes. ENT: Negative for sore throat. Cardiovascular: Negative for chest pain. Respiratory: Negative for shortness of breath. Gastrointestinal: Positive for abdominal pain Genitourinary: Negative for dysuria. Positive for vaginal bleeding Musculoskeletal: Negative for back pain. Skin: Negative for rash. Neurological: Negative for headaches, focal weakness or numbness.  10-point ROS otherwise negative.  ____________________________________________   PHYSICAL EXAM:  VITAL SIGNS: ED Triage Vitals  Enc Vitals Group     BP 02/19/15 0908 139/87 mmHg     Pulse Rate 02/19/15 0908 99     Resp 02/19/15 0908 20     Temp 02/19/15 0908 98.1 F (36.7 C)     Temp Source 02/19/15 0908 Oral     SpO2 02/19/15 0908  99 %     Weight 02/19/15 0908 365 lb (165.563 kg)     Height 02/19/15 0908 5' 5"  (1.651 m)     Head Cir --      Peak Flow --      Pain Score 02/19/15 0909 6     Pain Loc --      Pain Edu? --      Excl. in [place]? --     Constitutional: Alert and oriented. Well appearing and in no distress. Eyes: Conjunctivae are normal. PERRL. Normal extraocular movements. ENT   Head: Normocephalic and atraumatic.   Nose: No congestion/rhinnorhea.   Mouth/Throat: Mucous membranes are moist.   Neck: No stridor. Cardiovascular: Normal rate, regular rhythm. Normal and symmetric distal pulses are present in all extremities. No murmurs, rubs, or gallops. Respiratory: Normal respiratory effort without tachypnea nor retractions. Breath sounds are clear and equal bilaterally. No wheezes/rales/rhonchi. Gastrointestinal: Soft and nontender. No distention. No abdominal bruits.  Musculoskeletal: Nontender with normal range of motion in all extremities. No joint effusions.  No lower extremity tenderness nor edema. Neurologic:  Normal speech and language. No gross focal neurologic deficits are appreciated. Speech is normal. No gait instability. Skin:  Skin is warm, dry and intact. No rash noted. Psychiatric: Mood and affect are normal. Speech and behavior are normal. Patient exhibits appropriate insight and judgment. ____________________________________________  ED COURSE:  Pertinent labs & imaging results that were available during my care of the patient were reviewed by me and considered in my medical decision making (see chart for details). Patient is in no acute distress, will check basic labs and  reevaluate. ____________________________________________    LABS (pertinent positives/negatives)  Labs Reviewed  BASIC METABOLIC PANEL - Abnormal; Notable for the following:    Sodium 134 (*)    Calcium 8.5 (*)    All other components within normal limits  CBC - Abnormal; Notable for the following:     Hemoglobin 8.7 (*)    HCT 26.5 (*)    MCV 56.7 (*)    MCH 18.6 (*)    RDW 21.1 (*)    Platelets 449 (*)    All other components within normal limits  URINALYSIS COMPLETEWITH MICROSCOPIC ([facility] ONLY) - Abnormal; Notable for the following:    Color, Urine YELLOW (*)    APPearance HAZY (*)    Hgb urine dipstick 3+ (*)    Leukocytes, UA 1+ (*)    Squamous Epithelial / LPF 0-5 (*)    All other components within normal limits  HCG, QUANTITATIVE, PREGNANCY   IMPRESSION: 1. Abnormal thickening of the endometrium without apparent focal abnormality. If bleeding remains unresponsive to hormonal or medical therapy, focal lesion work-up with sonohysterogram should be considered. Endometrial biopsy should also be considered in pre-menopausal patients at high risk for endometrial carcinoma. (Ref: Radiological Reasoning: Algorithmic Workup of Abnormal Vaginal Bleeding with Endovaginal Sonography and Sonohysterography. AJR 2008; [id number]) 2. Small anterior uterine fibroid. 3. Neither ovary visualized. ____________________________________________  FINAL ASSESSMENT AND PLAN  Abnormal vaginal bleeding  Plan: Patient with labs and imaging as dictated above. I discussed the case with OB/GYN on-call. I discussed with Dr. Marcelline Mates on-call for GYN area we will try a taper of her birth control. She is being given Dr. Andreas Blower outpatient referral formation.   Earleen Newport, MD   Earleen Newport, MD 02/19/15 [phone]

## 2015-02-19 NOTE — ED Notes (Signed)
Pt aware of need for urine

## 2015-02-19 NOTE — ED Notes (Signed)
Patient transported to Ultrasound 

## 2015-02-19 NOTE — ED Notes (Signed)
Patient presents to the ED with vaginal bleeding since the beginning of November.  Patient states she was seen in the ED the first week of December and she was started on birth control to stop the bleeding.  Patient states the bleeding stopped for one day.  Patient reports very heavy bleeding the past two weeks.  Patient reports abdominal pain and back pain when she sits or stands that is relieved when she lies down.  Patient reports feeling tired and short of breath.  Patient reports tasting blood in her mouth.  Mucous membranes appear pale to this RN and patient reports pale color for her natural skin tone.

## 2015-03-11 ENCOUNTER — Emergency Department: Payer: Self-pay

## 2015-03-11 ENCOUNTER — Encounter: Payer: Self-pay | Admitting: Radiology

## 2015-03-11 ENCOUNTER — Observation Stay
Admission: EM | Admit: 2015-03-11 | Discharge: 2015-03-12 | Disposition: A | Discharge status: Home / Self Care | Payer: Self-pay | Attending: Internal Medicine | Admitting: Internal Medicine

## 2015-03-11 DIAGNOSIS — Z7901 Long term (current) use of anticoagulants: Secondary | ICD-10-CM | POA: Insufficient documentation

## 2015-03-11 DIAGNOSIS — I2699 Other pulmonary embolism without acute cor pulmonale: Secondary | ICD-10-CM

## 2015-03-11 DIAGNOSIS — I10 Essential (primary) hypertension: Secondary | ICD-10-CM | POA: Insufficient documentation

## 2015-03-11 DIAGNOSIS — I4510 Unspecified right bundle-branch block: Secondary | ICD-10-CM | POA: Insufficient documentation

## 2015-03-11 DIAGNOSIS — M545 Low back pain: Secondary | ICD-10-CM | POA: Insufficient documentation

## 2015-03-11 DIAGNOSIS — N939 Abnormal uterine and vaginal bleeding, unspecified: Secondary | ICD-10-CM | POA: Insufficient documentation

## 2015-03-11 DIAGNOSIS — Z8249 Family history of ischemic heart disease and other diseases of the circulatory system: Secondary | ICD-10-CM | POA: Insufficient documentation

## 2015-03-11 DIAGNOSIS — D259 Leiomyoma of uterus, unspecified: Secondary | ICD-10-CM | POA: Insufficient documentation

## 2015-03-11 DIAGNOSIS — R079 Chest pain, unspecified: Secondary | ICD-10-CM | POA: Insufficient documentation

## 2015-03-11 DIAGNOSIS — Z79899 Other long term (current) drug therapy: Secondary | ICD-10-CM | POA: Insufficient documentation

## 2015-03-11 DIAGNOSIS — D649 Anemia, unspecified: Secondary | ICD-10-CM

## 2015-03-11 DIAGNOSIS — R938 Abnormal findings on diagnostic imaging of other specified body structures: Secondary | ICD-10-CM | POA: Insufficient documentation

## 2015-03-11 DIAGNOSIS — R000 Tachycardia, unspecified: Secondary | ICD-10-CM | POA: Insufficient documentation

## 2015-03-11 DIAGNOSIS — R0602 Shortness of breath: Principal | ICD-10-CM | POA: Insufficient documentation

## 2015-03-11 DIAGNOSIS — R05 Cough: Secondary | ICD-10-CM | POA: Insufficient documentation

## 2015-03-11 DIAGNOSIS — N920 Excessive and frequent menstruation with regular cycle: Secondary | ICD-10-CM | POA: Insufficient documentation

## 2015-03-11 DIAGNOSIS — D62 Acute posthemorrhagic anemia: Secondary | ICD-10-CM | POA: Insufficient documentation

## 2015-03-11 DIAGNOSIS — Z6841 Body Mass Index (BMI) 40.0 and over, adult: Secondary | ICD-10-CM | POA: Insufficient documentation

## 2015-03-11 DIAGNOSIS — R0600 Dyspnea, unspecified: Secondary | ICD-10-CM | POA: Insufficient documentation

## 2015-03-11 HISTORY — DX: Essential (primary) hypertension: I10

## 2015-03-11 LAB — URINALYSIS COMPLETE WITH MICROSCOPIC (ARMC ONLY)
Bacteria, UA: NONE SEEN
Bilirubin Urine: NEGATIVE
Glucose, UA: NEGATIVE mg/dL
Ketones, ur: NEGATIVE mg/dL
Leukocytes, UA: NEGATIVE
Nitrite: NEGATIVE
Protein, ur: NEGATIVE mg/dL
Specific Gravity, Urine: 1.013 (ref 1.005–1.030)
pH: 7 (ref 5.0–8.0)

## 2015-03-11 LAB — TROPONIN I
Troponin I: <0.03 ng/mL (ref <0.031)
Troponin I: <0.03 ng/mL (ref <0.031)

## 2015-03-11 LAB — BASIC METABOLIC PANEL
Anion gap: 6 (ref 5–15)
BUN: 8 mg/dL (ref 6–20)
CO2: 28 mmol/L (ref 22–32)
Calcium: 8.6 mg/dL — ABNORMAL LOW (ref 8.9–10.3)
Chloride: 102 mmol/L (ref 101–111)
Creatinine, Ser: 0.78 mg/dL (ref 0.44–1.00)
GFR calc Af Amer: >60 mL/min (ref >60)
GFR calc non Af Amer: >60 mL/min (ref >60)
Glucose, Bld: 100 mg/dL — ABNORMAL HIGH (ref 65–99)
Potassium: 3.4 mmol/L — ABNORMAL LOW (ref 3.5–5.1)
Sodium: 136 mmol/L (ref 135–145)

## 2015-03-11 LAB — CBC
HCT: 24.2 % — ABNORMAL LOW (ref 35.0–47.0)
Hemoglobin: 7.8 g/dL — ABNORMAL LOW (ref 12.0–16.0)
MCH: 19 pg — ABNORMAL LOW (ref 26.0–34.0)
MCHC: 32.3 g/dL (ref 32.0–36.0)
MCV: 58.7 fL — ABNORMAL LOW (ref 80.0–100.0)
Platelets: 333 10*3/uL (ref 150–440)
RBC: 4.11 MIL/uL (ref 3.80–5.20)
RDW: 21.7 % — ABNORMAL HIGH (ref 11.5–14.5)
WBC: 9.8 10*3/uL (ref 3.6–11.0)

## 2015-03-11 LAB — PROTIME-INR
INR: 1.16
Prothrombin Time: 15 seconds (ref 11.4–15.0)

## 2015-03-11 LAB — APTT: aPTT: 31 seconds (ref 24–36)

## 2015-03-11 LAB — POCT PREGNANCY, URINE: Preg Test, Ur: NEGATIVE

## 2015-03-11 MED ORDER — ONDANSETRON HCL 4 MG/2ML IJ SOLN: 4.0000 mg | Freq: Four times a day (QID) | INTRAMUSCULAR | Status: DC | PRN

## 2015-03-11 MED ORDER — SODIUM CHLORIDE 0.9 % IJ SOLN: 3.0000 mL | Freq: Two times a day (BID) | INTRAMUSCULAR | Status: DC

## 2015-03-11 MED ORDER — ACETAMINOPHEN 325 MG PO TABS: 650.0000 mg | ORAL_TABLET | Freq: Four times a day (QID) | ORAL | Status: DC | PRN

## 2015-03-11 MED ORDER — ONDANSETRON HCL 4 MG PO TABS: 4.0000 mg | ORAL_TABLET | Freq: Four times a day (QID) | ORAL | Status: DC | PRN

## 2015-03-11 MED ORDER — SODIUM CHLORIDE 0.9 % IJ SOLN: 3.0000 mL | INTRAMUSCULAR | Status: DC | PRN

## 2015-03-11 MED ORDER — IOHEXOL 350 MG/ML SOLN
75.0000 mL | Freq: Once | INTRAVENOUS | Status: AC | PRN
Start: 2015-03-11 — End: 2015-03-11
  Administered 2015-03-11: 75 mL via INTRAVENOUS

## 2015-03-11 MED ORDER — MORPHINE SULFATE (PF) 2 MG/ML IV SOLN: 2.0000 mg | INTRAVENOUS | Status: DC | PRN

## 2015-03-11 MED ORDER — IPRATROPIUM-ALBUTEROL 0.5-2.5 (3) MG/3ML IN SOLN: 3.0000 mL | RESPIRATORY_TRACT | Status: DC | PRN

## 2015-03-11 MED ORDER — MORPHINE SULFATE (PF) 4 MG/ML IV SOLN
4.0000 mg | Freq: Once | INTRAVENOUS | Status: AC
  Administered 2015-03-11 (×2): 4 mg via INTRAVENOUS

## 2015-03-11 MED ORDER — HEPARIN (PORCINE) IN NACL 100-0.45 UNIT/ML-% IJ SOLN: 12.0000 [IU]/kg/h | Freq: Once | INTRAMUSCULAR | Status: DC

## 2015-03-11 MED ORDER — HEPARIN BOLUS VIA INFUSION
5000.0000 [IU] | Freq: Once | INTRAVENOUS | Status: AC
  Administered 2015-03-11: 5000 [IU] via INTRAVENOUS
  Filled 2015-03-11: qty 5000

## 2015-03-11 MED ORDER — HEPARIN (PORCINE) IN NACL 100-0.45 UNIT/ML-% IJ SOLN
1850.0000 [IU]/h | INTRAMUSCULAR | Status: DC
  Administered 2015-03-11: 1650 [IU]/h via INTRAVENOUS
  Filled 2015-03-11 (×3): qty 250

## 2015-03-11 MED ORDER — ACETAMINOPHEN 650 MG RE SUPP: 650.0000 mg | Freq: Four times a day (QID) | RECTAL | Status: DC | PRN

## 2015-03-11 MED ORDER — OXYCODONE HCL 5 MG PO TABS
5.0000 mg | ORAL_TABLET | ORAL | Status: DC | PRN
  Administered 2015-03-12 (×2): 5 mg via ORAL
  Filled 2015-03-11 (×2): qty 1

## 2015-03-11 MED ORDER — HEPARIN SODIUM (PORCINE) 5000 UNIT/ML IJ SOLN: 4000.0000 [IU] | Freq: Once | INTRAMUSCULAR | Status: DC

## 2015-03-11 MED ORDER — MORPHINE SULFATE (PF) 4 MG/ML IV SOLN
INTRAVENOUS | Status: AC
  Administered 2015-03-11: 4 mg via INTRAVENOUS
  Filled 2015-03-11: qty 1

## 2015-03-11 MED ORDER — FERROUS SULFATE 325 (65 FE) MG PO TABS
325.0000 mg | ORAL_TABLET | Freq: Every day | ORAL | Status: DC
  Administered 2015-03-11 – 2015-03-12 (×2): 325 mg via ORAL
  Filled 2015-03-11 (×2): qty 1

## 2015-03-11 NOTE — ED Provider Notes (Addendum)
[facility] Emergency Department Provider Note     Time seen: ----------------------------------------- 5:26 PM on 03/11/2015 -----------------------------------------    I have reviewed the triage vital signs and the nursing notes.   HISTORY  Chief Complaint Shortness of Breath    HPI Jean Morris is a 40 y.o. female who presents ER with shortness of breath and pain in her chest when she coughs. Patient's had nonproductive cough that started yesterday, denies any fever. Patient also reports low back pain started yesterday along with urinary frequency and burning with urination.Patient does take oral contraceptive pills, recently had high dose oral contraceptive pills for persistent vaginal bleeding.   No past medical history on file.  There are no active problems to display for this patient.   Past Surgical History  Procedure Laterality Date  . Cesarean section  1994    Allergies Review of patient's allergies indicates no known allergies.  Social History Social History  Substance Use Topics  . Smoking status: Never Smoker   . Smokeless tobacco: Not on file  . Alcohol Use: No    Review of Systems Constitutional: Negative for fever. Eyes: Negative for visual changes. ENT: Negative for sore throat. Cardiovascular: Positive for chest pain Respiratory: Positive shortness of breath Gastrointestinal: Negative for abdominal pain, vomiting and diarrhea. Genitourinary: Negative for dysuria. Musculoskeletal: Positive for back pain Skin: Negative for rash. Neurological: Negative for headaches, focal weakness or numbness.  10-point ROS otherwise negative.  ____________________________________________   PHYSICAL EXAM:  VITAL SIGNS: ED Triage Vitals  Enc Vitals Group     BP 03/11/15 1424 129/85 mmHg     Pulse Rate 03/11/15 1424 120     Resp 03/11/15 1424 18     Temp 03/11/15 1424 98.9 F (37.2 C)     Temp Source 03/11/15 1424 Oral      SpO2 03/11/15 1424 100 %     Weight 03/11/15 1424 350 lb (158.759 kg)     Height 03/11/15 1424 5' 5"  (1.651 m)     Head Cir --      Peak Flow --      Pain Score 03/11/15 1429 6     Pain Loc --      Pain Edu? --      Excl. in [place]? --     Constitutional: Alert and oriented. Well appearing and in no distress. Eyes: Conjunctivae are normal. PERRL. Normal extraocular movements. ENT   Head: Normocephalic and atraumatic.   Nose: No congestion/rhinnorhea.   Mouth/Throat: Mucous membranes are moist.   Neck: No stridor. Cardiovascular: Rapid rate, regular rhythm. Normal and symmetric distal pulses are present in all extremities. No murmurs, rubs, or gallops. Respiratory: Mild tachypnea, clear lungs bilaterally. Gastrointestinal: Soft and nontender. No distention. No abdominal bruits.  Musculoskeletal: Nontender with normal range of motion in all extremities. No joint effusions.  No lower extremity tenderness nor edema. No calf tenderness bilaterally Neurologic:  Normal speech and language. No gross focal neurologic deficits are appreciated. Speech is normal. No gait instability. Skin:  Skin is warm, dry and intact. No rash noted. Psychiatric: Mood and affect are normal. Speech and behavior are normal. Patient exhibits appropriate insight and judgment. ____________________________________________  EKG: Interpreted by me. Sinus tachycardia with a rate of 103 bpm, normal PR interval, normal QRS, normal QT interval. Normal axis, incomplete right bundle branch block  ____________________________________________  ED COURSE:  Pertinent labs & imaging results that were available during my care of the patient were reviewed by me and  considered in my medical decision making (see chart for details). Patient is in no acute distress, concern for PE given her clinical history. Her anemia is slightly worse, but not to the point of requiring blood transfusion. We'll evaluate with CT  angiogram ____________________________________________    LABS (pertinent positives/negatives)  Labs Reviewed  BASIC METABOLIC PANEL - Abnormal; Notable for the following:    Potassium 3.4 (*)    Glucose, Bld 100 (*)    Calcium 8.6 (*)    All other components within normal limits  CBC - Abnormal; Notable for the following:    Hemoglobin 7.8 (*)    HCT 24.2 (*)    MCV 58.7 (*)    MCH 19.0 (*)    RDW 21.7 (*)    All other components within normal limits  URINALYSIS COMPLETEWITH MICROSCOPIC ([facility] ONLY) - Abnormal; Notable for the following:    Color, Urine YELLOW (*)    APPearance CLEAR (*)    Hgb urine dipstick 3+ (*)    Squamous Epithelial / LPF 0-5 (*)    All other components within normal limits  TROPONIN I  POC URINE PREG, ED  POC URINE PREG, ED  POCT PREGNANCY, URINE    RADIOLOGY Images were viewed by me  CTA chest IMPRESSION: 1. 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 2. Trace left pleural effusion, with mild left basilar atelectasis. Critical Value/emergent results were called by telephone at the time of interpretation on 03/11/2015 at 7:59 pm to Dr. Lenise Arena, who verbally acknowledged these results. ____________________________________________  FINAL ASSESSMENT AND PLAN  Dyspnea, anemia, PE  Plan: Patient with labs and imaging as dictated above. Patient with several pulmonary emboli on the left side which is likely the cause of her symptoms. We'll recommend admission. The patient will be placed on heparin while in the ER. This will be, located case because birth control was increased temporarily to stop her vaginal bleeding to good effect, but likely caused pulmonary embolus.   Earleen Newport, MD   Earleen Newport, MD 03/11/15 2015  Earleen Newport, MD 03/11/15 2019

## 2015-03-11 NOTE — ED Notes (Signed)
Iv started in pt's rt AC per patient preference and cta is ordered for pt

## 2015-03-11 NOTE — H&P (Signed)
[facility] [place] NAME: Jean Morris    MR#:  [id number]  DATE OF BIRTH:  12/31/1975   DATE OF ADMISSION:  03/11/2015  PRIMARY CARE PHYSICIAN: No PCP Per Patient   REQUESTING/REFERRING PHYSICIAN: [place]  CHIEF COMPLAINT:   Chief Complaint  Patient presents with  . Shortness of Breath    HISTORY OF PRESENT ILLNESS:  Jean Morris  is a 40 y.o. female with a known history of heavy menstrual bleeding who is presenting with some breath. Of note she has recently evaluated the hospital for heavy menstrual periods at that time her oral contraceptive pill was increased to high dosage. Since then she has been doing fairly well. However, she has developed shortness of breath for the last 1 day duration, mainly dyspnea on exertion with associated nonproductive cough denies fevers chills chest pain palpitations further symptomatology. Denies any extended periods of travel or immobilization. Emergency department course workup reveals pulmonary embolism with pulmonary infarct  PAST MEDICAL HISTORY:   Past Medical History  Diagnosis Date  . Hypertension     PAST SURGICAL HISTORY:   Past Surgical History  Procedure Laterality Date  . Cesarean section  1994    SOCIAL HISTORY:   Social History  Substance Use Topics  . Smoking status: Never Smoker   . Smokeless tobacco: Not on file  . Alcohol Use: No    FAMILY HISTORY:   Family History  Problem Relation Age of Onset  . Hypertension Other     DRUG ALLERGIES:  No Known Allergies  REVIEW OF SYSTEMS:  REVIEW OF SYSTEMS:  CONSTITUTIONAL: Denies fevers, chills, fatigue, weakness.  EYES: Denies blurred vision, double vision, or eye pain.  EARS, NOSE, THROAT: Denies tinnitus, ear pain, hearing loss.  RESPIRATORY: Positive cough, shortness of breath, denies wheezing  CARDIOVASCULAR: Denies chest pain, palpitations, edema.  GASTROINTESTINAL: Denies nausea, vomiting, diarrhea,  abdominal pain.  GENITOURINARY: Denies dysuria, hematuria.  ENDOCRINE: Denies nocturia or thyroid problems. HEMATOLOGIC AND LYMPHATIC: Denies easy bruising or bleeding.  SKIN: Denies rash or lesions.  MUSCULOSKELETAL: Denies pain in neck, back, shoulder, knees, hips, or further arthritic symptoms.  NEUROLOGIC: Denies paralysis, paresthesias.  PSYCHIATRIC: Denies anxiety or depressive symptoms. Otherwise full review of systems performed by me is negative.   MEDICATIONS AT HOME:   Prior to Admission medications   Medication Sig Start Date End Date Taking? Authorizing Provider  ferrous sulfate 325 (65 FE) MG tablet Take 1 tablet (325 mg total) by mouth daily. 01/25/15 01/25/16 Yes Wandra Arthurs, MD  Norgestimate-Ethinyl Estradiol Triphasic (ORTHO TRI-CYCLEN LO) 0.18/0.215/0.25 MG-25 MCG tab Take 1 tablet by mouth daily. 01/25/15 01/25/16 Yes Wandra Arthurs, MD  oxyCODONE-acetaminophen (PERCOCET) 5-325 MG tablet Take 2 tablets by mouth every 6 (six) hours as needed for moderate pain or severe pain. 02/19/15  Yes Earleen Newport, MD  ondansetron (ZOFRAN) 4 MG tablet Take 1 tablet (4 mg total) by mouth daily as needed for nausea or vomiting. Patient not taking: Reported on 03/11/2015 02/19/15   Earleen Newport, MD      VITAL SIGNS:  Blood pressure 146/91, pulse 104, temperature 98.9 F (37.2 C), temperature source Oral, resp. rate 18, height 5' 5"  (1.651 m), weight 350 lb (158.759 kg), last menstrual period 03/10/2015, SpO2 99 %.  PHYSICAL EXAMINATION:  VITAL SIGNS: Filed Vitals:   03/11/15 1900 03/11/15 1956  BP: 126/86 146/91  Pulse: 97 104  Temp:    Resp:  GENERAL:39 y.o.female currently in no acute distress.  HEAD: Normocephalic, atraumatic.  EYES: Pupils equal, round, reactive to light. Extraocular muscles intact. No scleral icterus.  MOUTH: Moist mucosal membrane. Dentition intact. No abscess noted.  EAR, NOSE, THROAT: Clear without exudates. No external lesions.  NECK:  Supple. No thyromegaly. No nodules. No JVD.  PULMONARY: Clear to ascultation, without wheeze rails or rhonci. No use of accessory muscles, Good respiratory effort. good air entry bilaterally CHEST: Nontender to palpation.  CARDIOVASCULAR: S1 and S2. Regular rate and rhythm. No murmurs, rubs, or gallops. No edema. Pedal pulses 2+ bilaterally.  GASTROINTESTINAL: Soft, nontender, nondistended. No masses. Positive bowel sounds. No hepatosplenomegaly.  MUSCULOSKELETAL: No swelling, clubbing, or edema. Range of motion full in all extremities.  NEUROLOGIC: Cranial nerves II through XII are intact. No gross focal neurological deficits. Sensation intact. Reflexes intact.  SKIN: No ulceration, lesions, rashes, or cyanosis. Skin warm and dry. Turgor intact.  PSYCHIATRIC: Mood, affect within normal limits. The patient is awake, alert and oriented x 3. Insight, judgment intact.    LABORATORY PANEL:   CBC  Recent Labs Lab 03/11/15 1440  WBC 9.8  HGB 7.8*  HCT 24.2*  PLT 333   ------------------------------------------------------------------------------------------------------------------  Chemistries   Recent Labs Lab 03/11/15 1440  NA 136  K 3.4*  CL 102  CO2 28  GLUCOSE 100*  BUN 8  CREATININE 0.78  CALCIUM 8.6*   ------------------------------------------------------------------------------------------------------------------  Cardiac Enzymes  Recent Labs Lab 03/11/15 1440  TROPONINI <0.03   ------------------------------------------------------------------------------------------------------------------  RADIOLOGY:  Dg Chest 2 View  03/11/2015  CLINICAL DATA:  Shortness of breath and chest pain beginning yesterday. Cough. EXAM: CHEST  2 VIEW COMPARISON:  06/15/2013 FINDINGS: The heart size and mediastinal contours are within normal limits. Both lungs are clear. The visualized skeletal structures are unremarkable. IMPRESSION: No active cardiopulmonary disease. Electronically  Signed   By: Earle Gell M.D.   On: 03/11/2015 15:11   Ct Angio Chest Pe W/cm &/or Wo Cm  03/11/2015  CLINICAL DATA:  Acute onset of left-sided back and chest pain, with shortness of breath. Initial encounter. EXAM: CT ANGIOGRAPHY CHEST WITH CONTRAST TECHNIQUE: Multidetector CT imaging of the chest was performed using the standard protocol during bolus administration of intravenous contrast. Multiplanar CT image reconstructions and MIPs were obtained to evaluate the vascular anatomy. CONTRAST:  8m OMNIPAQUE IOHEXOL 350 MG/ML SOLN COMPARISON:  CTA of the chest performed 04/08/2013, and chest radiograph performed earlier today at 3:05 p.m. 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 The RV/LV ratio remains borderline normal. A trace left pleural effusion is noted, with mild left basilar atelectasis. There is no evidence of pneumothorax. No masses are identified; no abnormal focal contrast enhancement is seen. No mediastinal lymphadenopathy is seen. No pericardial effusion is identified. The great vessels are grossly unremarkable in appearance. No axillary lymphadenopathy is seen. The visualized portions of the thyroid gland are unremarkable in appearance. The visualized portions of the liver and spleen are unremarkable. The visualized portions of the pancreas, stomach, adrenal glands and kidneys are within normal limits. No acute osseous abnormalities are seen. Review of the MIP images confirms the above findings. IMPRESSION: 1. 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 2. Trace left pleural effusion, with mild left basilar atelectasis.  Critical Value/emergent results were called by telephone at the time of interpretation on  03/11/2015 at 7:59 pm to Dr. Lenise Arena, who verbally acknowledged these results. Electronically Signed   By: Garald Balding M.D.   On: 03/11/2015 20:01    EKG:   Orders placed or performed during the hospital encounter of 03/11/15  . ED EKG within 10 minutes  . ED EKG within 10 minutes  . EKG 12-Lead  . EKG 12-Lead    IMPRESSION AND PLAN:   40 year old African-American female history of heavy menstrual periods presenting with shortness of breath  1. Acute pulmonary embolism: She has been started on therapeutic heparin in emergency department, place on telemetry trend cardiac enzymes will check lower extremity Dopplers for clot burden. This is a provoked pulmonary embolism combination of oral contraceptive pills and obesity. Can start oral anticoagulation in the morning we'll need for at least 3 months. Consult OB/GYN for further recommendations of heavy menstrual periods 2. Essential hypertension: Place on as needed hydralazine 3. Venous thromboembolism prophylactic: Therapeutic heparin    All the records are reviewed and case discussed with ED provider. Management plans discussed with the patient, family and they are in agreement.  CODE STATUS: Full  TOTAL TIME TAKING CARE OF THIS PATIENT: 35 minutes.    Hower,  Karenann Cai.D on 03/11/2015 at 8:44 PM  Between 7am to 6pm - Pager - [phone]  After 6pm: House Pager: - [phone]  [facility]  Office  [phone]  CC: Primary care physician; No PCP Per Patient

## 2015-03-11 NOTE — ED Notes (Signed)
Pt reports SOB and pain in her chest when coughs non productive that started yesterday. Denies fever.   Pt also reports lower back pain that started yesterday along with urinary frequency and some burning with urination.

## 2015-03-11 NOTE — Progress Notes (Signed)
ANTICOAGULATION CONSULT NOTE - Initial Consult  Pharmacy Consult for heparin drip Indication: pulmonary embolus  No Known Allergies  Patient Measurements: Height: 5' 5"  (165.1 cm) Weight: (!) 350 lb (158.759 kg) IBW/kg (Calculated) : 57 Heparin Dosing Weight: 97.5 kg  Vital Signs: Temp: 98.9 F (37.2 C) (01/15 1424) Temp Source: Oral (01/15 1424) BP: 146/91 mmHg (01/15 1956) Pulse Rate: 104 (01/15 1956)  Labs:  Recent Labs  03/11/15 1440  HGB 7.8*  HCT 24.2*  PLT 333  CREATININE 0.78  TROPONINI <0.03    Estimated Creatinine Clearance: 142.7 mL/min (by C-G formula based on Cr of 0.78).   Medical History: Past Medical History  Diagnosis Date  . Hypertension    Assessment: Pharmacy consulted to dose heparin drip in this 40 year old female for a pulmonary embolism. Patient was not taking anticoagulants prior to admission.   Hgb 7.8 Plt 333  APTT and INR ordered STAT  Goal of Therapy:  Heparin level 0.3-0.7 units/ml Monitor platelets by anticoagulation protocol: Yes   Plan:  Give 5000 units bolus x 1 (~51 units/kg) Start heparin infusion at 1650 units/hr (~17 units/kg/hr) Check anti-Xa level in 6 hours and daily while on heparin Continue to monitor H&H and platelets  Darylene Price Zaydah Nawabi 03/11/2015,8:30 PM

## 2015-03-12 ENCOUNTER — Observation Stay: Payer: Self-pay

## 2015-03-12 DIAGNOSIS — N839 Noninflammatory disorder of ovary, fallopian tube and broad ligament, unspecified: Secondary | ICD-10-CM

## 2015-03-12 DIAGNOSIS — I2699 Other pulmonary embolism without acute cor pulmonale: Secondary | ICD-10-CM

## 2015-03-12 LAB — CBC
HCT: 24.1 % — ABNORMAL LOW (ref 35.0–47.0)
Hemoglobin: 7.5 g/dL — ABNORMAL LOW (ref 12.0–16.0)
MCH: 18.3 pg — ABNORMAL LOW (ref 26.0–34.0)
MCHC: 31 g/dL — ABNORMAL LOW (ref 32.0–36.0)
MCV: 59 fL — ABNORMAL LOW (ref 80.0–100.0)
Platelets: 372 10*3/uL (ref 150–440)
RBC: 4.08 MIL/uL (ref 3.80–5.20)
RDW: 21.8 % — ABNORMAL HIGH (ref 11.5–14.5)
WBC: 11 10*3/uL (ref 3.6–11.0)

## 2015-03-12 LAB — CBC WITH DIFFERENTIAL/PLATELET
Basophils Absolute: 0 10*3/uL (ref 0–0.1)
Basophils Relative: 0 %
Eosinophils Absolute: 0.1 10*3/uL (ref 0–0.7)
Eosinophils Relative: 1 %
HCT: 22.7 % — ABNORMAL LOW (ref 35.0–47.0)
Hemoglobin: 7.1 g/dL — ABNORMAL LOW (ref 12.0–16.0)
Lymphocytes Relative: 22 %
Lymphs Abs: 2.9 10*3/uL (ref 1.0–3.6)
MCH: 18.2 pg — ABNORMAL LOW (ref 26.0–34.0)
MCHC: 31.2 g/dL — ABNORMAL LOW (ref 32.0–36.0)
MCV: 58.1 fL — ABNORMAL LOW (ref 80.0–100.0)
Monocytes Absolute: 0.8 10*3/uL (ref 0.2–0.9)
Monocytes Relative: 6 %
Neutro Abs: 9.7 10*3/uL — ABNORMAL HIGH (ref 1.4–6.5)
Neutrophils Relative %: 71 %
Platelets: 344 10*3/uL (ref 150–440)
RBC: 3.91 MIL/uL (ref 3.80–5.20)
RDW: 21.8 % — ABNORMAL HIGH (ref 11.5–14.5)
WBC: 13.5 10*3/uL — ABNORMAL HIGH (ref 3.6–11.0)

## 2015-03-12 LAB — TROPONIN I
Troponin I: <0.03 ng/mL (ref <0.031)
Troponin I: <0.03 ng/mL (ref <0.031)

## 2015-03-12 LAB — VITAMIN B12: Vitamin B-12: 238 pg/mL (ref 180–914)

## 2015-03-12 LAB — ABO/RH: ABO/RH(D): B POS

## 2015-03-12 LAB — PREPARE RBC (CROSSMATCH)

## 2015-03-12 LAB — IRON AND TIBC
Iron: 9 ug/dL — ABNORMAL LOW (ref 28–170)
Saturation Ratios: 3 % — ABNORMAL LOW (ref 10.4–31.8)
TIBC: 329 ug/dL (ref 250–450)
UIBC: 320 ug/dL

## 2015-03-12 LAB — FERRITIN: Ferritin: 16 ng/mL (ref 11–307)

## 2015-03-12 LAB — ANTITHROMBIN III: AntiThromb III Func: 81 % (ref 75–120)

## 2015-03-12 LAB — RETICULOCYTES
RBC.: 4.2 MIL/uL (ref 3.80–5.20)
Retic Count, Absolute: 142.8 10*3/uL (ref 19.0–183.0)
Retic Ct Pct: 3.4 % — ABNORMAL HIGH (ref 0.4–3.1)

## 2015-03-12 LAB — FOLATE: Folate: 13 ng/mL (ref >5.9)

## 2015-03-12 LAB — HEPARIN LEVEL (UNFRACTIONATED): Heparin Unfractionated: 0.28 IU/mL — ABNORMAL LOW (ref 0.30–0.70)

## 2015-03-12 MED ORDER — HEPARIN BOLUS VIA INFUSION
1500.0000 [IU] | Freq: Once | INTRAVENOUS | Status: AC
  Administered 2015-03-12: 1500 [IU] via INTRAVENOUS
  Filled 2015-03-12: qty 1500

## 2015-03-12 MED ORDER — APIXABAN 5 MG PO TABS: 5.0000 mg | ORAL_TABLET | Freq: Two times a day (BID) | ORAL | Status: DC

## 2015-03-12 MED ORDER — APIXABAN 5 MG PO TABS
10.0000 mg | ORAL_TABLET | Freq: Once | ORAL | Status: AC
  Administered 2015-03-12: 10 mg via ORAL
  Filled 2015-03-12: qty 2

## 2015-03-12 MED ORDER — ACETAMINOPHEN 325 MG PO TABS
650.0000 mg | ORAL_TABLET | Freq: Once | ORAL | Status: AC
  Administered 2015-03-12: 650 mg via ORAL
  Filled 2015-03-12: qty 2

## 2015-03-12 MED ORDER — SODIUM CHLORIDE 0.9 % IV SOLN
Freq: Once | INTRAVENOUS | Status: AC
  Administered 2015-03-12: 18:00:00 via INTRAVENOUS

## 2015-03-12 MED ORDER — FERROUS FUMARATE 325 (106 FE) MG PO TABS
1.0000 | ORAL_TABLET | Freq: Two times a day (BID) | ORAL | Status: DC
  Administered 2015-03-12 (×2): 106 mg via ORAL
  Filled 2015-03-12 (×2): qty 1

## 2015-03-12 MED ORDER — FERROUS SULFATE 325 (65 FE) MG PO TABS: 325.0000 mg | ORAL_TABLET | Freq: Two times a day (BID) | ORAL | Status: DC

## 2015-03-12 MED ORDER — APIXABAN 5 MG PO TABS: 10.0000 mg | ORAL_TABLET | Freq: Two times a day (BID) | ORAL | Status: DC

## 2015-03-12 MED ORDER — APIXABAN 5 MG PO TABS
10.0000 mg | ORAL_TABLET | Freq: Two times a day (BID) | ORAL | Status: DC
  Administered 2015-03-12: 10 mg via ORAL
  Filled 2015-03-12: qty 2

## 2015-03-12 NOTE — Care Management Note (Signed)
Case Management Note  Patient Details  Name: Yashika LACHAE HOHLER MRN: [id number] Date of Birth: 06-Jan-1976  Subjective/Objective:   Call to the [facility] who are open today and have Eliquis in stock. Provided Ms Kosa with an application to [facility] and instructed her to take the application and her prescriptions across the street from [facility] to the [facility] immediately after discharge home today. Provided Ms Frost with a list of local clinics which accept uninsured patients and encouraged her to make an appointment with one of these clinics this week. Ms haacke verbalized understanding and agreed with this plan.                Action/Plan:   Expected Discharge Date:                  Expected Discharge Plan:     In-House Referral:     Discharge planning Services     Post Acute Care Choice:    Choice offered to:     DME Arranged:    DME Agency:     HH Arranged:    [facility] Agency:     Status of Service:     Medicare Important Message Given:    Date Medicare IM Given:    Medicare IM give by:    Date Additional Medicare IM Given:    Additional Medicare Important Message give by:     If discussed at [place] of Stay Meetings, dates discussed:    Additional Comments:  Yohannes Waibel A, RN 03/12/2015, 11:29 AM

## 2015-03-12 NOTE — Progress Notes (Signed)
Pt blood completed. No signs of reaction. Discharge instructions explained to patient. Verbalized understanding. Prescriptions given to patient. IVs removed. Night time medication given to patient. Pt in no signs or symptoms of distress. Escorted off unit by staff and family.

## 2015-03-12 NOTE — Progress Notes (Addendum)
ANTICOAGULATION CONSULT NOTE - Initial Consult  Pharmacy Consult for heparin drip Indication: pulmonary embolus  No Known Allergies  Patient Measurements: Height: 5' 5"  (165.1 cm) Weight: (!) 373 lb 12.8 oz (169.555 kg) IBW/kg (Calculated) : 57 Heparin Dosing Weight: 97.5 kg  Vital Signs: Temp: 99.4 F (37.4 C) (01/15 2149) Temp Source: Oral (01/15 2149) BP: 108/57 mmHg (01/15 2149) Pulse Rate: 108 (01/15 2149)  Labs:  Recent Labs  03/11/15 1440 03/11/15 2055 03/12/15 0336  HGB 7.8*  --   --   HCT 24.2*  --   --   PLT 333  --   --   APTT  --  31  --   LABPROT  --  15.0  --   INR  --  1.16  --   HEPARINUNFRC  --   --  0.28*  CREATININE 0.78  --   --   TROPONINI <0.03 <0.03 <0.03    Estimated Creatinine Clearance: 149 mL/min (by C-G formula based on Cr of 0.78).   Medical History: Past Medical History  Diagnosis Date  . Hypertension    Assessment: Pharmacy consulted to dose heparin drip in this 40 year old female for a pulmonary embolism. Patient was not taking anticoagulants prior to admission.   Hgb 7.8 Plt 333  APTT and INR ordered STAT  Goal of Therapy:  Heparin level 0.3-0.7 units/ml Monitor platelets by anticoagulation protocol: Yes   Plan:  Give 5000 units bolus x 1 (~51 units/kg) Start heparin infusion at 1650 units/hr (~17 units/kg/hr) Check anti-Xa level in 6 hours and daily while on heparin Continue to monitor H&H and platelets  1/16 0330 heparin level 0.28. 1500 unit bolus and increase rate to 1850 units/hr. Recheck heparin level and CBC in 6 hours.  1/16 AM Patient transitioned to Eliquis per consult. 10 mg bid ordered for 7 days to be followed by 5 mg bid.  Donjuan Robison S 03/12/2015,4:16 AM

## 2015-03-12 NOTE — Discharge Summary (Signed)
[facility] [place] at [facility]   PATIENT NAME: Jean Morris    MR#:  [id number]  DATE OF BIRTH:  [birth year]  DATE OF ADMISSION:  03/11/2015 ADMITTING PHYSICIAN: Wyatt Haste, MD  DATE OF DISCHARGE: 03/12/2015  PRIMARY CARE PHYSICIAN: No PCP Per Patient    ADMISSION DIAGNOSIS:  Pulmonary embolism (HCC) [I26.99] Anemia, unspecified anemia type [D64.9] Other acute pulmonary embolism without acute cor pulmonale (HCC) [I26.99]  DISCHARGE DIAGNOSIS:  Principal Problem:   Pulmonary embolism (HCC)   SECONDARY DIAGNOSIS:   Past Medical History  Diagnosis Date  . Hypertension     HOSPITAL COURSE:    40 year old female with heavy menses who presented with shortness of breath and found to have pulmonary light.. For further details please further H&P.  1. Pulmonary emboli within segmental and some of the segmental branches to the left lower lung lobe: Patient was placed on IV heparin and transitioned Elliquis. Case management was consulted for medication assistance. Patient lower extremity Doppler was negative for DVT. I ordered hypercoagulable panel that he should be noted patient was already on heparin drip and this was ordered. I suspect the etiology of her pulmonary emboli is the estrogen from birth control pills. She was told that she can no longer take estrogen pills.  2. Chronic anemia with acute blood loss due to heavy menses: Patient was given 1 unit of blood due to her low hemoglobin. She is also continued on iron supplementation although this was increased to twice a day. She was seen and evaluated by OB/GYN.   DISCHARGE CONDITIONS AND DIET:  stable for discharge on a regular diet  CONSULTS OBTAINED:  Treatment Team:  Wyatt Haste, MD Hildred Laser, MD  DRUG ALLERGIES:  No Known Allergies  DISCHARGE MEDICATIONS:   Current Discharge Medication List    START taking these medications   Details  !! apixaban (ELIQUIS) 5 MG TABS tablet  Take 2 tablets (10 mg total) by mouth 2 (two) times daily. Qty: 26 tablet, Refills: 0    !! apixaban (ELIQUIS) 5 MG TABS tablet Take 1 tablet (5 mg total) by mouth 2 (two) times daily. Starting 03/19/2015 after loading dose of this medication. Qty: 60 tablet, Refills: 0     !! - Potential duplicate medications found. Please discuss with provider.    CONTINUE these medications which have CHANGED   Details  ferrous sulfate 325 (65 FE) MG tablet Take 1 tablet (325 mg total) by mouth 2 (two) times daily with a meal. Qty: 30 tablet, Refills: 0      CONTINUE these medications which have NOT CHANGED   Details  oxyCODONE-acetaminophen (PERCOCET) 5-325 MG tablet Take 2 tablets by mouth every 6 (six) hours as needed for moderate pain or severe pain. Qty: 30 tablet, Refills: 0    ondansetron (ZOFRAN) 4 MG tablet Take 1 tablet (4 mg total) by mouth daily as needed for nausea or vomiting. Qty: 20 tablet, Refills: 1      STOP taking these medications     Norgestimate-Ethinyl Estradiol Triphasic (ORTHO TRI-CYCLEN LO) 0.18/0.215/0.25 MG-25 MCG tab               Today   CHIEF COMPLAINT:  Patient is doing well this morning. Patient denies chest pressure this of breath. Patient denies calf pain.   VITAL SIGNS:  Blood pressure 108/57, pulse 108, temperature 99.4 F (37.4 C), temperature source Oral, resp. rate 30, height 5\' 5"  (1.651 m), weight 169.555 kg (373 lb 12.8  oz), last menstrual period 03/10/2015, SpO2 100 %.   REVIEW OF SYSTEMS:  Review of Systems  Constitutional: Negative for fever, chills and malaise/fatigue.  HENT: Negative for sore throat.   Eyes: Negative for blurred vision.  Respiratory: Negative for cough, hemoptysis, shortness of breath and wheezing.   Cardiovascular: Negative for chest pain, palpitations and leg swelling.  Gastrointestinal: Negative for nausea, vomiting, abdominal pain, diarrhea and blood in stool.  Genitourinary: Negative for dysuria.   Musculoskeletal: Negative for back pain.  Neurological: Negative for dizziness, tremors and headaches.  Endo/Heme/Allergies: Does not bruise/bleed easily.     PHYSICAL EXAMINATION:  GENERAL:  40 y.o.-year-old obese patient lying in the bed with no acute distress.  NECK:  Supple, no jugular venous distention. No thyroid enlargement, no tenderness.  LUNGS: Normal breath sounds bilaterally, no wheezing, rales,rhonchi  No use of accessory muscles of respiration.  CARDIOVASCULAR: S1, S2 normal. No murmurs, rubs, or gallops.  ABDOMEN: Soft, non-tender, non-distended. Bowel sounds present. No organomegaly or mass.  EXTREMITIES: No pedal edema, cyanosis, or clubbing.  PSYCHIATRIC: The patient is alert and oriented x 3.  SKIN: No obvious rash, lesion, or ulcer.   DATA REVIEW:   CBC  Recent Labs Lab 03/12/15 0336  WBC 13.5*  HGB 7.1*  HCT 22.7*  PLT 344    Chemistries   Recent Labs Lab 03/11/15 1440  NA 136  K 3.4*  CL 102  CO2 28  GLUCOSE 100*  BUN 8  CREATININE 0.78  CALCIUM 8.6*    Cardiac Enzymes  Recent Labs Lab 03/11/15 2055 03/12/15 0336 03/12/15 0842  TROPONINI <0.03 <0.03 <0.03    Microbiology Results  @[email or web address] @  RADIOLOGY:  Dg Chest 2 View  03/11/2015  CLINICAL DATA:  Shortness of breath and chest pain beginning yesterday. Cough. EXAM: CHEST  2 VIEW COMPARISON:  06/15/2013 FINDINGS: The heart size and mediastinal contours are within normal limits. Both lungs are clear. The visualized skeletal structures are unremarkable. IMPRESSION: No active cardiopulmonary disease. Electronically Signed   By: Myles Rosenthal M.D.   On: 03/11/2015 15:11   Ct Angio Chest Pe W/cm &/or Wo Cm  03/11/2015  CLINICAL DATA:  Acute onset of left-sided back and chest pain, with shortness of breath. Initial encounter. EXAM: CT ANGIOGRAPHY CHEST WITH CONTRAST TECHNIQUE: Multidetector CT imaging of the chest was performed using the standard protocol during bolus administration of  intravenous contrast. Multiplanar CT image reconstructions and MIPs were obtained to evaluate the vascular anatomy. CONTRAST:  75mL OMNIPAQUE IOHEXOL 350 MG/ML SOLN COMPARISON:  CTA of the chest performed 04/08/2013, and chest radiograph performed earlier today at 3:05 p.m. 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 The RV/LV ratio remains borderline normal. A trace left pleural effusion is noted, with mild left basilar atelectasis. There is no evidence of pneumothorax. No masses are identified; no abnormal focal contrast enhancement is seen. No mediastinal lymphadenopathy is seen. No pericardial effusion is identified. The great vessels are grossly unremarkable in appearance. No axillary lymphadenopathy is seen. The visualized portions of the thyroid gland are unremarkable in appearance. The visualized portions of the liver and spleen are unremarkable. The visualized portions of the pancreas, stomach, adrenal glands and kidneys are within normal limits. No acute osseous abnormalities are seen. Review of the MIP images confirms the above findings. IMPRESSION: 1. Pulmonary embolus within segmental and subsegmental branches to the left  lower lung lobe, with mild underlying pulmonary infarct at the anterior aspect of the left lower lobe. 2. Trace left pleural effusion, with mild left basilar atelectasis. Critical Value/emergent results were called by telephone at the time of interpretation on 03/11/2015 at 7:59 pm to Dr. Daryel November, who verbally acknowledged these results. Electronically Signed   By: Roanna Raider M.D.   On: 03/11/2015 20:01   US Venous Img Lower Bilateral  03/12/2015  CLINICAL DATA:  Pulmonary embolism. EXAM: BILATERAL LOWER EXTREMITY VENOUS DOPPLER ULTRASOUND TECHNIQUE: Gray-scale sonography with graded compression, as well as color Doppler  and duplex ultrasound were performed to evaluate the lower extremity deep venous systems from the level of the common femoral vein and including the common femoral, femoral, profunda femoral, popliteal and calf veins including the posterior tibial, peroneal and gastrocnemius veins when visible. The superficial great saphenous vein was also interrogated. Spectral Doppler was utilized to evaluate flow at rest and with distal augmentation maneuvers in the common femoral, femoral and popliteal veins. COMPARISON:  04/08/2013. FINDINGS: RIGHT LOWER EXTREMITY Common Femoral Vein: No evidence of thrombus. Normal compressibility, respiratory phasicity and response to augmentation. Saphenofemoral Junction: No evidence of thrombus. Normal compressibility and flow on color Doppler imaging. Profunda Femoral Vein: No evidence of thrombus. Normal compressibility and flow on color Doppler imaging. Femoral Vein: No evidence of thrombus. Normal compressibility, respiratory phasicity and response to augmentation. Popliteal Vein: No evidence of thrombus. Normal compressibility, respiratory phasicity and response to augmentation. Calf Veins: No evidence of thrombus. Normal compressibility and flow on color Doppler imaging. Superficial Great Saphenous Vein: No evidence of thrombus. Normal compressibility and flow on color Doppler imaging. Other Findings:  Limited exam due to patient's body habitus. LEFT LOWER EXTREMITY Common Femoral Vein: No evidence of thrombus. Normal compressibility, respiratory phasicity and response to augmentation. Saphenofemoral Junction: No evidence of thrombus. Normal compressibility and flow on color Doppler imaging. Profunda Femoral Vein: No evidence of thrombus. Normal compressibility and flow on color Doppler imaging. Femoral Vein: No evidence of thrombus. Normal compressibility, respiratory phasicity and response to augmentation. Popliteal Vein: No evidence of thrombus. Normal compressibility, respiratory  phasicity and response to augmentation. Calf Veins: No evidence of thrombus. Normal compressibility and flow on color Doppler imaging. Superficial Great Saphenous Vein: No evidence of thrombus. Normal compressibility and flow on color Doppler imaging. Other Findings:  Limited exam due to patient's body habitus. IMPRESSION: No evidence of deep venous thrombosis. Limited exam due to patient's body habitus. Electronically Signed   By: Maisie Fus  Register   On: 03/12/2015 10:57      Management plans discussed with the patient and she is in agreement. Stable for discharge   Patient should follow up with OBGYN 1 week adn [facility] 1 week  CODE STATUS:     Code Status Orders        Start     Ordered   03/11/15 2025  Full code   Continuous     03/11/15 2024    Code Status History    Date Active Date Inactive Code Status Order ID Comments User Context   This patient has a current code status but no historical code status.      TOTAL TIME TAKING CARE OF THIS PATIENT: 35 minutes.    Note: This dictation was prepared with Dragon dictation along with smaller phrase technology. Any transcriptional errors that result from this process are unintentional.  Sung Parodi M.D on 03/12/2015 at 11:01 AM  Between 7am to 6pm - Pager -  [phone] After 6pm go to [email or web address] - password [email or web address] [facility]  [facility]  Office  [phone]  CC: Primary care physician; No PCP Per Patient

## 2015-03-12 NOTE — Progress Notes (Signed)
[facility] [place] at [facility]   PATIENT NAME: Jean Morris    MR#:  [id number]  DATE OF BIRTH:  02-19-1976  SUBJECTIVE:   Patient reports no chest pain shortness of breath no lower extremity pain  REVIEW OF SYSTEMS:    Review of Systems  Constitutional: Negative for fever, chills and malaise/fatigue.  HENT: Negative for sore throat.   Eyes: Negative for blurred vision.  Respiratory: Negative for cough, hemoptysis, shortness of breath and wheezing.   Cardiovascular: Negative for chest pain, palpitations and leg swelling.  Gastrointestinal: Negative for nausea, vomiting, abdominal pain, diarrhea and blood in stool.  Genitourinary: Negative for dysuria.  Musculoskeletal: Negative for back pain.  Neurological: Negative for dizziness, tremors and headaches.  Endo/Heme/Allergies: Does not bruise/bleed easily.    Tolerating Diet: Yes      DRUG ALLERGIES:  No Known Allergies  VITALS:  Blood pressure 108/57, pulse 108, temperature 99.4 F (37.4 C), temperature source Oral, resp. rate 30, height 5\' 5"  (1.651 m), weight 169.555 kg (373 lb 12.8 oz), last menstrual period 03/10/2015, SpO2 100 %.  PHYSICAL EXAMINATION:   Physical Exam  Constitutional: She is oriented to person, place, and time and well-developed, well-nourished, and in no distress. No distress.  HENT:  Head: Normocephalic.  Eyes: No scleral icterus.  Neck: Normal range of motion. Neck supple. No JVD present. No tracheal deviation present.  Cardiovascular: Normal rate, regular rhythm and normal heart sounds.  Exam reveals no gallop and no friction rub.   No murmur heard. Pulmonary/Chest: Effort normal and breath sounds normal. No respiratory distress. She has no wheezes. She has no rales. She exhibits no tenderness.  Abdominal: Soft. Bowel sounds are normal. She exhibits no distension and no mass. There is no tenderness. There is no rebound and no guarding.  Musculoskeletal: Normal  range of motion. She exhibits no edema.  Neurological: She is alert and oriented to person, place, and time.  Skin: Skin is warm. No rash noted. No erythema.  Psychiatric: Affect and judgment normal.      LABORATORY PANEL:   CBC  Recent Labs Lab 03/12/15 0336  WBC 13.5*  HGB 7.1*  HCT 22.7*  PLT 344   ------------------------------------------------------------------------------------------------------------------  Chemistries   Recent Labs Lab 03/11/15 1440  NA 136  K 3.4*  CL 102  CO2 28  GLUCOSE 100*  BUN 8  CREATININE 0.78  CALCIUM 8.6*   ------------------------------------------------------------------------------------------------------------------  Cardiac Enzymes  Recent Labs Lab 03/11/15 2055 03/12/15 0336 03/12/15 0842  TROPONINI <0.03 <0.03 <0.03   ------------------------------------------------------------------------------------------------------------------  RADIOLOGY:  Dg Chest 2 View  03/11/2015  CLINICAL DATA:  Shortness of breath and chest pain beginning yesterday. Cough. EXAM: CHEST  2 VIEW COMPARISON:  06/15/2013 FINDINGS: The heart size and mediastinal contours are within normal limits. Both lungs are clear. The visualized skeletal structures are unremarkable. IMPRESSION: No active cardiopulmonary disease. Electronically Signed   By: Myles Rosenthal M.D.   On: 03/11/2015 15:11   Ct Angio Chest Pe W/cm &/or Wo Cm  03/11/2015  CLINICAL DATA:  Acute onset of left-sided back and chest pain, with shortness of breath. Initial encounter. EXAM: CT ANGIOGRAPHY CHEST WITH CONTRAST TECHNIQUE: Multidetector CT imaging of the chest was performed using the standard protocol during bolus administration of intravenous contrast. Multiplanar CT image reconstructions and MIPs were obtained to evaluate the vascular anatomy. CONTRAST:  75mL OMNIPAQUE IOHEXOL 350 MG/ML SOLN COMPARISON:  CTA of the chest performed 04/08/2013, and chest radiograph performed earlier  today at  3:05 p.m. 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 The RV/LV ratio remains borderline normal. A trace left pleural effusion is noted, with mild left basilar atelectasis. There is no evidence of pneumothorax. No masses are identified; no abnormal focal contrast enhancement is seen. No mediastinal lymphadenopathy is seen. No pericardial effusion is identified. The great vessels are grossly unremarkable in appearance. No axillary lymphadenopathy is seen. The visualized portions of the thyroid gland are unremarkable in appearance. The visualized portions of the liver and spleen are unremarkable. The visualized portions of the pancreas, stomach, adrenal glands and kidneys are within normal limits. No acute osseous abnormalities are seen. Review of the MIP images confirms the above findings. IMPRESSION: 1. 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 2. Trace left pleural effusion, with mild left basilar atelectasis. Critical Value/emergent results were called by telephone at the time of interpretation on 03/11/2015 at 7:59 pm to Dr. Daryel November, who verbally acknowledged these results. Electronically Signed   By: Roanna Raider M.D.   On: 03/11/2015 20:01   US Venous Img Lower Bilateral  03/12/2015  CLINICAL DATA:  Pulmonary embolism. EXAM: BILATERAL LOWER EXTREMITY VENOUS DOPPLER ULTRASOUND TECHNIQUE: Gray-scale sonography with graded compression, as well as color Doppler and duplex ultrasound were performed to evaluate the lower extremity deep venous systems from the level of the common femoral vein and including the common femoral, femoral, profunda femoral, popliteal and calf veins including the posterior tibial,  peroneal and gastrocnemius veins when visible. The superficial great saphenous vein was also interrogated. Spectral Doppler was utilized to evaluate flow at rest and with distal augmentation maneuvers in the common femoral, femoral and popliteal veins. COMPARISON:  04/08/2013. FINDINGS: RIGHT LOWER EXTREMITY Common Femoral Vein: No evidence of thrombus. Normal compressibility, respiratory phasicity and response to augmentation. Saphenofemoral Junction: No evidence of thrombus. Normal compressibility and flow on color Doppler imaging. Profunda Femoral Vein: No evidence of thrombus. Normal compressibility and flow on color Doppler imaging. Femoral Vein: No evidence of thrombus. Normal compressibility, respiratory phasicity and response to augmentation. Popliteal Vein: No evidence of thrombus. Normal compressibility, respiratory phasicity and response to augmentation. Calf Veins: No evidence of thrombus. Normal compressibility and flow on color Doppler imaging. Superficial Great Saphenous Vein: No evidence of thrombus. Normal compressibility and flow on color Doppler imaging. Other Findings:  Limited exam due to patient's body habitus. LEFT LOWER EXTREMITY Common Femoral Vein: No evidence of thrombus. Normal compressibility, respiratory phasicity and response to augmentation. Saphenofemoral Junction: No evidence of thrombus. Normal compressibility and flow on color Doppler imaging. Profunda Femoral Vein: No evidence of thrombus. Normal compressibility and flow on color Doppler imaging. Femoral Vein: No evidence of thrombus. Normal compressibility, respiratory phasicity and response to augmentation. Popliteal Vein: No evidence of thrombus. Normal compressibility, respiratory phasicity and response to augmentation. Calf Veins: No evidence of thrombus. Normal compressibility and flow on color Doppler imaging. Superficial Great Saphenous Vein: No evidence of thrombus. Normal compressibility and flow on color Doppler  imaging. Other Findings:  Limited exam due to patient's body habitus. IMPRESSION: No evidence of deep venous thrombosis. Limited exam due to patient's body habitus. Electronically Signed   By: Maisie Fus  Register   On: 03/12/2015 10:57     ASSESSMENT AND PLAN:   40 year old female  with heavy menses who presented with shortness of breath and found to have pulmonary light.. For further details please further H&P.  1. Pulmonary emboli within segmental and some of the segmental branches to the left lower lung lobe: Patient was placed on IV heparin and transitioned Elliquis. Case management was consulted for medication assistance. Patient lower extremity Doppler was negative for DVT. I ordered hypercoagulable panel that he should be noted patient was already on heparin drip and this was ordered. I suspect the etiology of her pulmonary emboli is the estrogen from birth control pills. She was told that she can no longer take estrogen pills.  2. Chronic anemia with acute blood loss due to heavy menses: Patient was given 1 unit of blood due to her low hemoglobin. She is also continued on iron supplementation although this was increased to twice a day. She was seen and evaluated by OB/GYN. Anemia panel is pending.     Management plans discussed with the patient and she is in agreement.  CODE STATUS: FULL  TOTAL TIME TAKING CARE OF THIS PATIENT: 30 minutes.     POSSIBLE D/C today, DEPENDING ON OBGYN.   Taeko Schaffer M.D on 03/12/2015 at 11:06 AM  Between 7am to 6pm - Pager - [phone] After 6pm go to [email or web address] - password [email or web address] [facility]  [facility]  Office  [phone]  CC: Primary care physician; No PCP Per Patient  Note: This dictation was prepared with Dragon dictation along with smaller phrase technology. Any transcriptional errors that result from this process are unintentional.

## 2015-03-12 NOTE — Consult Note (Signed)
Reason for Consult: Abnormal uterine bleeding, Pulmonary embolus Referring Physician: Dr. Bettey Costa  HPI:  Jean Morris is an 40 y.o. P76 female with morbid obesity, severe anemia, and cHTN who is currently admitted for newly diagnosed pulmonary embolism.  Patient of note, has been having abnormal menstrual bleeding since October 2016.  Patient notes that she began her cycle and never stopped bleeding.  Was using ~ 4 pads per day. She was seen in early January in the Emergency Room for her abnormal bleeding and was started on cyclic OCPs.  The patient notes that the bleeding stopped for ~ 2 days, but then resumed, so she returned again to the Emergency Room where she was then placed on an OCP taper for her bleeding.  She notes that she took most of the taper, and then discontinued as she was feeling better.  Notes that her bleeding stopped last week, however had an episode of light spotting yesterday.  However,  patient presented to the hospital yesterday due to complaints of SOB.  Was diagnosed with a pulmonary embolus and severe anemia.      Pertinent Gynecological History: Menses: flow is moderate, and was regular up until October of 2016.  Last month was associated with moderate cramping.  Bleeding: dysfunctional uterine bleeding Contraception: none prior to Emergency Room visit in October, was initiated on OCPs.  DES exposure: denies Blood transfusions: none Sexually transmitted diseases: no past history Previous GYN Procedures: none  Last mammogram: patient has not had a mammogram.  Last pap: patient cannot recall date of last pap smear but notes that it was normal (several years ago, at least 5-10)  OB History: G3, P3   Menstrual History: Menarche age: 54 Patient's last menstrual period was 11/29/2014.    Past Medical History  Diagnosis Date  . Hypertension     Past Surgical History  Procedure Laterality Date  . Cesarean section  1994    Family History  Problem Relation  Age of Onset  . Hypertension Other     Social History:  reports that she has never smoked. She does not have any smokeless tobacco history on file. She reports that she does not drink alcohol or use illicit drugs.  Allergies: No Known Allergies  Medications:  Prior to Admission:  Prescriptions prior to admission  Medication Sig Dispense Refill Last Dose  . Norgestimate-Ethinyl Estradiol Triphasic (ORTHO TRI-CYCLEN LO) 0.18/0.215/0.25 MG-25 MCG tab Take 1 tablet by mouth daily. 1 Package 1 03/01/2015  . oxyCODONE-acetaminophen (PERCOCET) 5-325 MG tablet Take 2 tablets by mouth every 6 (six) hours as needed for moderate pain or severe pain. 30 tablet 0 03/08/2015  . [DISCONTINUED] ferrous sulfate 325 (65 FE) MG tablet Take 1 tablet (325 mg total) by mouth daily. 30 tablet 0 03/10/2015 at Unknown time  . ondansetron (ZOFRAN) 4 MG tablet Take 1 tablet (4 mg total) by mouth daily as needed for nausea or vomiting. (Patient not taking: Reported on 03/11/2015) 20 tablet 1 Not Taking at Unknown time   Scheduled: . sodium chloride   Intravenous Once  . acetaminophen  650 mg Oral Once  . apixaban  10 mg Oral BID  . [START ON 03/19/2015] apixaban  5 mg Oral BID  . ferrous fumarate  1 tablet Oral BID  . ferrous sulfate  325 mg Oral Daily    Review of Systems  Constitutional: Negative for fever, chills and malaise/fatigue.  HENT: Negative for congestion, nosebleeds and sore throat.   Eyes: Negative for blurred vision  and discharge.  Respiratory: Negative for cough, sputum production, shortness of breath and wheezing.   Cardiovascular: Negative for chest pain, palpitations and leg swelling.  Gastrointestinal: Negative for nausea, vomiting, abdominal pain and constipation.  Genitourinary: Negative for dysuria, urgency, frequency and hematuria.  Musculoskeletal: Negative for myalgias and joint pain.  Skin: Negative for itching and rash.  Neurological: Negative for dizziness, seizures and headaches.   Endo/Heme/Allergies: Does not bruise/bleed easily.  Psychiatric/Behavioral: Negative for depression and substance abuse.    Blood pressure 137/70, pulse 93, temperature 98.8 F (37.1 C), temperature source Oral, resp. rate 18, height 5' 5"  (1.651 m), weight 373 lb 12.8 oz (169.555 kg), last menstrual period 03/10/2015, SpO2 99 %. Physical Exam  Constitutional: She is oriented to person, place, and time. She appears well-developed and well-nourished. No distress.  Obese  HENT:  Head: Normocephalic and atraumatic.  Eyes: Conjunctivae and EOM are normal. Pupils are equal, round, and reactive to light. No scleral icterus.  Neck: Normal range of motion. Neck supple. No thyromegaly present.  Cardiovascular: Normal rate and regular rhythm.  Exam reveals no gallop and no friction rub.   No murmur heard. Respiratory: Effort normal and breath sounds normal. No respiratory distress. She has no wheezes.  GI: Soft. Bowel sounds are normal. She exhibits no distension and no mass. There is no tenderness.  Genitourinary: No vaginal discharge found.  Unable to palpate uterus or adnexae due to body habitus, but nontender  Musculoskeletal: Normal range of motion. She exhibits no edema or tenderness.  Neurological: She is alert and oriented to person, place, and time.  Skin: Skin is warm and dry. No rash noted. No erythema.  Psychiatric: She has a normal mood and affect. Her behavior is normal.    Results for orders placed or performed during the hospital encounter of 03/11/15 (from the past 48 hour(s))  Basic metabolic panel     Status: Abnormal   Collection Time: 03/11/15  2:40 PM  Result Value Ref Range   Sodium 136 135 - 145 mmol/L   Potassium 3.4 (L) 3.5 - 5.1 mmol/L   Chloride 102 101 - 111 mmol/L   CO2 28 22 - 32 mmol/L   Glucose, Bld 100 (H) 65 - 99 mg/dL   BUN 8 6 - 20 mg/dL   Creatinine, Ser 0.78 0.44 - 1.00 mg/dL   Calcium 8.6 (L) 8.9 - 10.3 mg/dL   GFR calc non Af Amer >60 >60 mL/min    GFR calc Af Amer >60 >60 mL/min    Comment: (NOTE) The eGFR has been calculated using the CKD EPI equation. This calculation has not been validated in all clinical situations. eGFR's persistently <60 mL/min signify possible Chronic Kidney Disease.    Anion gap 6 5 - 15  CBC     Status: Abnormal   Collection Time: 03/11/15  2:40 PM  Result Value Ref Range   WBC 9.8 3.6 - 11.0 K/uL   RBC 4.11 3.80 - 5.20 MIL/uL   Hemoglobin 7.8 (L) 12.0 - 16.0 g/dL   HCT 24.2 (L) 35.0 - 47.0 %   MCV 58.7 (L) 80.0 - 100.0 fL   MCH 19.0 (L) 26.0 - 34.0 pg   MCHC 32.3 32.0 - 36.0 g/dL   RDW 21.7 (H) 11.5 - 14.5 %   Platelets 333 150 - 440 K/uL  Troponin I     Status: None   Collection Time: 03/11/15  2:40 PM  Result Value Ref Range   Troponin I <0.03 <0.031 ng/mL  Comment:        NO INDICATION OF MYOCARDIAL INJURY.   Urinalysis complete, with microscopic ([facility] only)     Status: Abnormal   Collection Time: 03/11/15  2:40 PM  Result Value Ref Range   Color, Urine YELLOW (A) YELLOW   APPearance CLEAR (A) CLEAR   Glucose, UA NEGATIVE NEGATIVE mg/dL   Bilirubin Urine NEGATIVE NEGATIVE   Ketones, ur NEGATIVE NEGATIVE mg/dL   Specific Gravity, Urine 1.013 1.005 - 1.030   Hgb urine dipstick 3+ (A) NEGATIVE   pH 7.0 5.0 - 8.0   Protein, ur NEGATIVE NEGATIVE mg/dL   Nitrite NEGATIVE NEGATIVE   Leukocytes, UA NEGATIVE NEGATIVE   RBC / HPF 6-30 0 - 5 RBC/hpf   WBC, UA 0-5 0 - 5 WBC/hpf   Bacteria, UA NONE SEEN NONE SEEN   Squamous Epithelial / LPF 0-5 (A) NONE SEEN   Mucous PRESENT   Pregnancy, urine POC     Status: None   Collection Time: 03/11/15  6:57 PM  Result Value Ref Range   Preg Test, Ur NEGATIVE NEGATIVE    Comment:        THE SENSITIVITY OF THIS METHODOLOGY IS >24 mIU/mL   Protime-INR     Status: None   Collection Time: 03/11/15  8:55 PM  Result Value Ref Range   Prothrombin Time 15.0 11.4 - 15.0 seconds   INR 1.16   APTT     Status: None   Collection Time: 03/11/15  8:55  PM  Result Value Ref Range   aPTT 31 24 - 36 seconds  Troponin I (q 6hr x 3)     Status: None   Collection Time: 03/11/15  8:55 PM  Result Value Ref Range   Troponin I <0.03 <0.031 ng/mL    Comment:        NO INDICATION OF MYOCARDIAL INJURY.   CBC with Differential/Platelet     Status: Abnormal   Collection Time: 03/12/15  3:36 AM  Result Value Ref Range   WBC 13.5 (H) 3.6 - 11.0 K/uL   RBC 3.91 3.80 - 5.20 MIL/uL   Hemoglobin 7.1 (L) 12.0 - 16.0 g/dL   HCT 22.7 (L) 35.0 - 47.0 %   MCV 58.1 (L) 80.0 - 100.0 fL   MCH 18.2 (L) 26.0 - 34.0 pg   MCHC 31.2 (L) 32.0 - 36.0 g/dL   RDW 21.8 (H) 11.5 - 14.5 %   Platelets 344 150 - 440 K/uL   Neutrophils Relative % 71 %   Neutro Abs 9.7 (H) 1.4 - 6.5 K/uL   Lymphocytes Relative 22 %   Lymphs Abs 2.9 1.0 - 3.6 K/uL   Monocytes Relative 6 %   Monocytes Absolute 0.8 0.2 - 0.9 K/uL   Eosinophils Relative 1 %   Eosinophils Absolute 0.1 0 - 0.7 K/uL   Basophils Relative 0 %   Basophils Absolute 0.0 0 - 0.1 K/uL  Heparin level (unfractionated)     Status: Abnormal   Collection Time: 03/12/15  3:36 AM  Result Value Ref Range   Heparin Unfractionated 0.28 (L) 0.30 - 0.70 IU/mL    Comment:        IF HEPARIN RESULTS ARE BELOW EXPECTED VALUES, AND PATIENT DOSAGE HAS BEEN CONFIRMED, SUGGEST FOLLOW UP TESTING OF ANTITHROMBIN III LEVELS.   Troponin I (q 6hr x 3)     Status: None   Collection Time: 03/12/15  3:36 AM  Result Value Ref Range   Troponin I <0.03 <0.031 ng/mL  Comment:        NO INDICATION OF MYOCARDIAL INJURY.   Troponin I (q 6hr x 3)     Status: None   Collection Time: 03/12/15  8:42 AM  Result Value Ref Range   Troponin I <0.03 <0.031 ng/mL    Comment:        NO INDICATION OF MYOCARDIAL INJURY.   Folate     Status: None   Collection Time: 03/12/15  8:42 AM  Result Value Ref Range   Folate 13.0 >5.9 ng/mL  Iron and TIBC     Status: Abnormal   Collection Time: 03/12/15  8:42 AM  Result Value Ref Range   Iron 9  (L) 28 - 170 ug/dL   TIBC 329 250 - 450 ug/dL   Saturation Ratios 3 (L) 10.4 - 31.8 %   UIBC 320 ug/dL  Ferritin     Status: None   Collection Time: 03/12/15  8:42 AM  Result Value Ref Range   Ferritin 16 11 - 307 ng/mL  Reticulocytes     Status: Abnormal   Collection Time: 03/12/15  8:42 AM  Result Value Ref Range   Retic Ct Pct 3.4 (H) 0.4 - 3.1 %   RBC. 4.20 3.80 - 5.20 MIL/uL   Retic Count, Manual 142.8 19.0 - 183.0 K/uL    Dg Chest 2 View  03/11/2015  CLINICAL DATA:  Shortness of breath and chest pain beginning yesterday. Cough. EXAM: CHEST  2 VIEW COMPARISON:  06/15/2013 FINDINGS: The heart size and mediastinal contours are within normal limits. Both lungs are clear. The visualized skeletal structures are unremarkable. IMPRESSION: No active cardiopulmonary disease. Electronically Signed   By: Earle Gell M.D.   On: 03/11/2015 15:11   Ct Angio Chest Pe W/cm &/or Wo Cm  03/11/2015  CLINICAL DATA:  Acute onset of left-sided back and chest pain, with shortness of breath. Initial encounter. EXAM: CT ANGIOGRAPHY CHEST WITH CONTRAST TECHNIQUE: Multidetector CT imaging of the chest was performed using the standard protocol during bolus administration of intravenous contrast. Multiplanar CT image reconstructions and MIPs were obtained to evaluate the vascular anatomy. CONTRAST:  72m OMNIPAQUE IOHEXOL 350 MG/ML SOLN COMPARISON:  CTA of the chest performed 04/08/2013, and chest radiograph performed earlier today at 3:05 p.m. 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 The RV/LV ratio remains borderline normal. A trace left pleural effusion is noted, with mild left basilar atelectasis. There is no evidence of pneumothorax. No masses are identified; no abnormal focal contrast enhancement is seen. No mediastinal lymphadenopathy is seen. No pericardial  effusion is identified. The great vessels are grossly unremarkable in appearance. No axillary lymphadenopathy is seen. The visualized portions of the thyroid gland are unremarkable in appearance. The visualized portions of the liver and spleen are unremarkable. The visualized portions of the pancreas, stomach, adrenal glands and kidneys are within normal limits. No acute osseous abnormalities are seen. Review of the MIP images confirms the above findings. IMPRESSION: 1. 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 2. Trace left pleural effusion, with mild left basilar atelectasis. Critical Value/emergent results were called by telephone at the time of interpretation on 03/11/2015 at 7:59 pm to Dr. JLenise Arena who verbally acknowledged these results. Electronically Signed   By: JJacqulynn Cadet  Chang M.D.   On: 03/11/2015 20:01   US Venous Img Lower Bilateral  03/12/2015  CLINICAL DATA:  Pulmonary embolism. EXAM: BILATERAL LOWER EXTREMITY VENOUS DOPPLER ULTRASOUND TECHNIQUE: Gray-scale sonography with graded compression, as well as color Doppler and duplex ultrasound were performed to evaluate the lower extremity deep venous systems from the level of the common femoral vein and including the common femoral, femoral, profunda femoral, popliteal and calf veins including the posterior tibial, peroneal and gastrocnemius veins when visible. The superficial great saphenous vein was also interrogated. Spectral Doppler was utilized to evaluate flow at rest and with distal augmentation maneuvers in the common femoral, femoral and popliteal veins. COMPARISON:  04/08/2013. FINDINGS: RIGHT LOWER EXTREMITY Common Femoral Vein: No evidence of thrombus. Normal compressibility, respiratory phasicity and response to augmentation. Saphenofemoral Junction: No evidence of thrombus. Normal compressibility  and flow on color Doppler imaging. Profunda Femoral Vein: No evidence of thrombus. Normal compressibility and flow on color Doppler imaging. Femoral Vein: No evidence of thrombus. Normal compressibility, respiratory phasicity and response to augmentation. Popliteal Vein: No evidence of thrombus. Normal compressibility, respiratory phasicity and response to augmentation. Calf Veins: No evidence of thrombus. Normal compressibility and flow on color Doppler imaging. Superficial Great Saphenous Vein: No evidence of thrombus. Normal compressibility and flow on color Doppler imaging. Other Findings:  Limited exam due to patient's body habitus. LEFT LOWER EXTREMITY Common Femoral Vein: No evidence of thrombus. Normal compressibility, respiratory phasicity and response to augmentation. Saphenofemoral Junction: No evidence of thrombus. Normal compressibility and flow on color Doppler imaging. Profunda Femoral Vein: No evidence of thrombus. Normal compressibility and flow on color Doppler imaging. Femoral Vein: No evidence of thrombus. Normal compressibility, respiratory phasicity and response to augmentation. Popliteal Vein: No evidence of thrombus. Normal compressibility, respiratory phasicity and response to augmentation. Calf Veins: No evidence of thrombus. Normal compressibility and flow on color Doppler imaging. Superficial Great Saphenous Vein: No evidence of thrombus. Normal compressibility and flow on color Doppler imaging. Other Findings:  Limited exam due to patient's body habitus. IMPRESSION: No evidence of deep venous thrombosis. Limited exam due to patient's body habitus. Electronically Signed   By: Marcello Moores  Register   On: 03/12/2015 10:57     02/19/2015 Pelvic Ultrasound:  FINDINGS: Examination is somewhat limited by body habitus.  Uterus  Measurements: 12.7 x 6.5 x 7.9 cm. There is an anterior uterine fibroid measuring 18 x 13 x 15 mm. The myometrium is heterogeneous without other focal  lesion.  Endometrium  Thickness: 2.7 cm. The endometrium appears heterogeneous without apparent focal lesion.  Right ovary  Measurements: Not visualized.  Left ovary  Measurements: Not visualized.  Other findings  No abnormal free fluid.  IMPRESSION: 1. Abnormal thickening of the endometrium without apparent focal abnormality. If bleeding remains unresponsive to hormonal or medical therapy, focal lesion work-up with sonohysterogram should be considered. Endometrial biopsy should also be considered in pre-menopausal patients at high risk for endometrial carcinoma.  Assessment/Plan: 1) Abnormal Uterine Bleeding with Anemia (Hgb currently 7.1) - patient without any further bleeding currently.  Would recommend completion of workup outpatient, which would entail pap smear and endometrial biopsy.  Can also further discuss longer term management options (as patient is no longer a candidate for estrogen containing therapies).  Plan per Hospitalist is to transfuse patient 1 unit PRBCs prior to discharge.  Patient will need to follow up in GYN office within the next 7-10 days.  Coag studies wnl.  2) Uterine fibroid - small, anterior location (not submucosal).  Not likely the cause of patient's abnormal uterine bleeding.  No immediate intervention required at this time 3) Morbid obesity - discussed how this can be a factor in abnormal bleeding, and increases risk for endometrial hyperplasia and malignancy.   4) Pulmonary Emboli - patient being managed by Hospitalist, placed on IV heparin and transitioned to Eliquis.    Patient is anticipated to be discharged home sometime today.  Will ensure that patient has a scheduled follow-up appointment in ~ 1 week to GYN office.  Thank you for the consult.    Rubie Maid, MD [facility] [phone] (pager)

## 2015-03-13 LAB — TYPE AND SCREEN
ABO/RH(D): B POS
Antibody Screen: NEGATIVE
Unit division: 0

## 2015-03-15 LAB — FACTOR 5 LEIDEN

## 2015-03-15 LAB — CARDIOLIPIN ANTIBODIES, IGG, IGM, IGA
Anticardiolipin IgA: <9 APL U/mL (ref 0–11)
Anticardiolipin IgG: <9 GPL U/mL (ref 0–14)
Anticardiolipin IgM: <9 MPL U/mL (ref 0–12)

## 2015-03-15 LAB — PROTEIN C, TOTAL: Protein C, Total: 80 % (ref 60–150)

## 2015-03-15 LAB — PROTHROMBIN GENE MUTATION

## 2015-03-15 LAB — DRVVT MIX: dRVVT Mix: 42.8 s (ref 0.0–44.0)

## 2015-03-15 LAB — BETA-2-GLYCOPROTEIN I ABS, IGG/M/A
Beta-2 Glyco I IgG: <9 GPI IgG units (ref 0–20)
Beta-2-Glycoprotein I IgA: <9 GPI IgA units (ref 0–25)
Beta-2-Glycoprotein I IgM: <9 GPI IgM units (ref 0–32)

## 2015-03-15 LAB — PROTEIN S, TOTAL: Protein S Ag, Total: 131 % (ref 60–150)

## 2015-03-15 LAB — HOMOCYSTEINE: Homocysteine: 8.6 umol/L (ref 0.0–15.0)

## 2015-03-15 LAB — HEXAGONAL PHASE PHOSPHOLIPID: Hexagonal Phase Phospholipid: 5 s (ref 0–11)

## 2015-03-15 LAB — PROTEIN S ACTIVITY: Protein S Activity: 88 % (ref 63–140)

## 2015-03-15 LAB — PROTEIN C ACTIVITY: Protein C Activity: 97 % (ref 73–180)

## 2015-03-15 LAB — PTT-LA MIX: PTT-LA Mix: 42.3 s — ABNORMAL HIGH (ref 0.0–40.6)

## 2015-03-15 LAB — LUPUS ANTICOAGULANT PANEL
DRVVT: 49.9 s — ABNORMAL HIGH (ref 0.0–44.0)
PTT Lupus Anticoagulant: 42.3 s — ABNORMAL HIGH (ref 0.0–40.6)

## 2015-03-28 ENCOUNTER — Ambulatory Visit: Payer: Self-pay

## 2015-04-03 ENCOUNTER — Ambulatory Visit (INDEPENDENT_AMBULATORY_CARE_PROVIDER_SITE_OTHER): Payer: Self-pay | Admitting: Obstetrics and Gynecology

## 2015-04-03 ENCOUNTER — Ambulatory Visit: Payer: Self-pay

## 2015-04-03 ENCOUNTER — Encounter: Payer: Self-pay | Admitting: Obstetrics and Gynecology

## 2015-04-03 VITALS — BP 115/75 | HR 87 | Ht 64.5 in | Wt 376.5 lb

## 2015-04-03 DIAGNOSIS — N921 Excessive and frequent menstruation with irregular cycle: Secondary | ICD-10-CM

## 2015-04-03 NOTE — Patient Instructions (Signed)

## 2015-04-05 ENCOUNTER — Ambulatory Visit: Payer: Self-pay

## 2015-04-05 LAB — PATHOLOGY

## 2015-04-06 ENCOUNTER — Encounter: Payer: Self-pay | Admitting: Obstetrics and Gynecology

## 2015-04-06 NOTE — Progress Notes (Signed)
GYNECOLOGY CLINIC PROGRESS NOTE  Subjective:     Jean Morris is a 40 y.o. P28 woman who presents for f/u after hospital admission for newly diagnosed pulmonary embolism after OCP use for abnormal uterine bleeding and fibroids. Patient of note, has been having abnormal menstrual bleeding since October 2016. Patient notes that she began her cycle and never stopped bleeding. Was using ~ 4 pads per day. She was seen in early January in the Emergency Room for her abnormal bleeding and was started on cyclic OCPs. The patient notes that the bleeding stopped for ~ 2 days, but then resumed, so she returned again to the Emergency Room where she was then placed on an OCP taper for her bleeding. She notes that she took most of the taper, and then discontinued as she was feeling better. Was seen again in the hospital on 03/12/2015 for SOB, and was diagnosed with PE and severe anemia. Was transfused 1 unit PRBCs in hospital.  Is currently on Eliquis for treatment of PE.   Pertinent Gynecological History: Menses: flow is moderate, and was regular up until October of 2016. Last month was associated with moderate cramping.  Bleeding: dysfunctional uterine bleeding Contraception: none prior to Emergency Room visit in October, was initiated on OCPs.  Sexually transmitted diseases: no past history Previous GYN Procedures: none  Last mammogram: patient has not had a mammogram.  Last pap: patient cannot recall date of last pap smear but notes that it was normal (several years ago, at least 5-10)  OB History: G3, P3  Menstrual History: Menarche age: 53 Patient's last menstrual period was 03/10/2015.    OB History  Gravida Para Term Preterm AB SAB TAB Ectopic Multiple Living  3 3 3       3     # Outcome Date GA Lbr Len/2nd Weight Sex Delivery Anes PTL Lv  3 Term     M CS-LTranv   Y  2 Term     M Vag-Spont   Y  1 Term     F Vag-Spont   Y      Past Medical History  Diagnosis Date  .  Hypertension   . Anemia   . Menorrhagia     Family History  Problem Relation Age of Onset  . Hypertension Other   . Hypertension Mother   . Stroke Mother   . Heart attack Mother   . Hypertension Father     Past Surgical History  Procedure Laterality Date  . Cesarean section  1994    Social History   Social History  . Marital Status: Single    Spouse Name: N/A  . Number of Children: N/A  . Years of Education: N/A   Occupational History  . Not on file.   Social History Main Topics  . Smoking status: Never Smoker   . Smokeless tobacco: Not on file  . Alcohol Use: No  . Drug Use: No  . Sexual Activity: Not Currently    Birth Control/ Protection: None   Other Topics Concern  . Not on file   Social History Narrative      Review of Systems Pertinent items noted in HPI and remainder of comprehensive ROS otherwise negative.     Objective:    BP 115/75 mmHg  Pulse 87  Ht 5' 4.5" (1.638 m)  Wt 376 lb 8 oz (170.779 kg)  BMI 63.65 kg/m2  LMP 03/10/2015  General appearance: alert and no distress, morbidly obese Lungs: clear to auscultation  bilaterally Heart: regular rate and rhythm, S1, S2 normal, no murmur, click, rub or gallop Abdomen: soft, non-tender; bowel sounds normal; no masses,  no organomegaly, large pannus.  Well healed Pfannenstiel incision.  Pelvic: cervix normal in appearance, exam obscured by obesity, external genitalia normal, no adnexal masses or tenderness, no cervical motion tenderness, rectovaginal septum normal and vagina normal without discharge.  Unable to appreciate uterine size.  Extremities: extremities normal, atraumatic, no cyanosis or edema Skin: Skin color, texture, turgor normal. No rashes or lesions Neurologic: Grossly normal      Imaging: 02/19/2015 Pelvic Ultrasound FINDINGS: Examination is somewhat limited by body habitus.  Uterus  Measurements: 12.7 x 6.5 x 7.9 cm. There is an anterior uterine fibroid measuring 18 x 13 x  15 mm. The myometrium is heterogeneous without other focal lesion.  Endometrium  Thickness: 2.7 cm. The endometrium appears heterogeneous without apparent focal lesion.  Right ovary  Measurements: Not visualized.  Left ovary  Measurements: Not visualized.  Other findings  No abnormal free fluid.  IMPRESSION: 1. Abnormal thickening of the endometrium without apparent focal abnormality. If bleeding remains unresponsive to hormonal or medical therapy, focal lesion work-up with sonohysterogram should be considered. Endometrial biopsy should also be considered in pre-menopausal patients at high risk for endometrial carcinoma.  Assessment:   menorrhagia Fibroid uterus Pulmonary embolus Morbid Obesity (Class III)   Plan:    Diagnosis explained in detail.   Patient has abnormal uterine bleeding.  She has a normal exam, no evidence of lesions. Pelvic ultrasound notes slightly enlarged uterus with small uterine fibroid.  Thickened endometrium present (2.7 cm).  Endometrial biopsy performed today.  See procedure note for details below.  Discussed management options for abnormal uterine bleeding including tranexamic acid (Lysteda), oral progesterone (Megace), Depo Provera, Mirena IUD, endometrial ablation (Novasure/Hydrothermal Ablation) or hysterectomy as definitive surgical management.  Patient no longer able to use estrogen containing products.  Discussed risks and benefits of each method.   Patient desires Mirena IUD.  Printed patient education handouts were given to the patient to review at home.  To f/u in 1-2 weeks for IUD placement and review of biopsy results.  Pulmonary Embolus - patient to be on Eliquis for total of 6 months for treatment.  Understands that she can no longer use estrogen containing products.  Morbid Obesity - likely contributor to abnormal uterine bleeding.  To discuss weight management at future visits.  Based on patient's BMI, would benefit from Bariatric  surgery.      Endometrial Biopsy Procedure Note  The patient is positioned on the exam table in the dorsal lithotomy position. Bimanual exam confirms uterine position and size. A Graves speculum is placed into the vagina. A single toothed tenaculum is placed onto the anterior lip of the cervix. The pipette is placed into the endocervical canal and is advanced to the uterine fundus. Using a piston like technique, with vacuum created by withdrawing the stylus, the endometrial specimen is obtained and transferred to the biopsy container. Minimal bleeding is encountered. The procedure is well tolerated.   Uterine Position: mid    Uterine Length: 11 cm   Uterine Specimen: Average   Post procedure instructions are given. The patient is scheduled for follow up appointment.   Rubie Maid, MD [facility]

## 2015-04-11 ENCOUNTER — Ambulatory Visit: Payer: Self-pay

## 2015-04-11 LAB — CBC AND DIFFERENTIAL
HCT: 33 % — AB (ref 36–46)
Hemoglobin: 10 g/dL — AB (ref 12.0–16.0)
Neutrophils Absolute: 4 /uL
Platelets: 465 10*3/uL — AB (ref 150–399)
WBC: 6.4 10^3/mL

## 2015-04-18 ENCOUNTER — Encounter: Payer: Self-pay | Admitting: Obstetrics and Gynecology

## 2015-04-18 ENCOUNTER — Ambulatory Visit (INDEPENDENT_AMBULATORY_CARE_PROVIDER_SITE_OTHER): Payer: Self-pay | Admitting: Obstetrics and Gynecology

## 2015-04-18 ENCOUNTER — Ambulatory Visit: Payer: Self-pay

## 2015-04-18 VITALS — BP 107/62 | HR 98 | Ht 64.5 in | Wt 380.7 lb

## 2015-04-18 DIAGNOSIS — N939 Abnormal uterine and vaginal bleeding, unspecified: Secondary | ICD-10-CM | POA: Insufficient documentation

## 2015-04-18 DIAGNOSIS — D500 Iron deficiency anemia secondary to blood loss (chronic): Secondary | ICD-10-CM

## 2015-04-18 DIAGNOSIS — Z6841 Body Mass Index (BMI) 40.0 and over, adult: Secondary | ICD-10-CM

## 2015-04-18 DIAGNOSIS — D649 Anemia, unspecified: Secondary | ICD-10-CM | POA: Insufficient documentation

## 2015-04-18 DIAGNOSIS — Z124 Encounter for screening for malignant neoplasm of cervix: Secondary | ICD-10-CM

## 2015-04-18 LAB — PROTIME-INR: Protime: 41.7 seconds — AB (ref 10.0–13.8)

## 2015-04-18 LAB — POCT INR: INR: 1.2 — AB (ref <=1.1)

## 2015-04-18 MED ORDER — ACETAMINOPHEN ER 650 MG PO TBCR: 650.0000 mg | EXTENDED_RELEASE_TABLET | Freq: Three times a day (TID) | ORAL | Status: DC | PRN

## 2015-04-18 MED ORDER — LEVONORGESTREL 20 MCG/24HR IU IUD: 1.0000 | INTRAUTERINE_SYSTEM | Freq: Once | INTRAUTERINE | Status: DC

## 2015-04-18 NOTE — Progress Notes (Signed)
    GYNECOLOGY PROGRESS NOTE  Subjective:    Patient ID: Jean Morris, female    DOB: September 19, 1975, 40 y.o.   MRN: [id number]  HPI  Patient is a 40 y.o. [id number] female who presents for f/u of endometrial biopsy results performed for abnormal uterine bleeding and Mirena IUD insertion. Last pap smear was "many years ago" and was normal.  Patient denies any further bleeding since recent hospitalization last month.   The following portions of the patient's history were reviewed and updated as appropriate: allergies, current medications, past family history, past medical history, past social history, past surgical history and problem list.  Review of Systems Pertinent items noted in HPI and remainder of comprehensive ROS otherwise negative.   Objective:   Blood pressure 107/62, pulse 98, height 5' 4.5" (1.638 m), weight 380 lb 11.2 oz (172.684 kg), last menstrual period 03/28/2015.  Body mass index is 64.36 kg/(m^2). General appearance: alert and no distress, morbidly obese Abdomen: soft, non-tender; bowel sounds normal; no masses,  no organomegaly Pelvic: cervix normal in appearance, external genitalia normal, no adnexal masses or tenderness, rectovaginal septum normal and vagina normal without discharge Extremities: extremities normal, atraumatic, no cyanosis or edema Neurologic: Grossly normal    Pathology:  ENDOMETRIUM, BIOPSY:  PROLIFERATIVE ENDOMETRIUM.  NO HYPERPLASIA OR CARCINOMA.    Assessment:   Abnormal uterine bleeding Encounter for Mirena IUD insertion Anemia Morbid obesity (Class III, BMI 64) Cervical cancer screening  Plan:   Patient with h/o abnormal uterine bleeding s/p PE after use of combined OCPs.  For Mirena IUD today. Mirena inserted today without difficulty.  See Procedure note below.  Discussed results of endometrial biopsy. Benign proliferative endometrium noted.  Anemia - patient currently taking iron tablets for supplementation.  Will recheck at next  visit (at 4 week string check).  Morbid obesity - may be a contributor to abnormal bleeding and leads to increased risk of hyperplasia.  Once patient's bleeding and Hgb better controlled, would recommend referral for bariatric surgery consult.    Cervical cancer screening - patient has not had a pap smear in many years.  Pap smear performed today in light of h/o abnormal uterine bleeding.     IUD Insertion Procedure Note Patient identified, informed consent performed, consent signed.   Discussed risks of irregular bleeding, cramping, infection, malpositioning or misplacement of the IUD outside the uterus which may require further procedure such as laparoscopy. Time out was performed.  Urine pregnancy test negative.  Speculum placed in the vagina.  Cervix visualized.  Cleaned with Betadine x 2.  Grasped anteriorly with a single tooth tenaculum.  Uterus sounded to 9 cm.  Mirena IUD placed per manufacturer's recommendations.  Strings trimmed to 3 cm. Tenaculum was removed, good hemostasis noted.  Patient tolerated procedure well.   Patient was given post-procedure instructions.  She was advised to have backup contraception for one week.  Patient was also asked to check IUD strings periodically and follow up in 4 weeks for IUD check.    A total of 15 minutes were spent face-to-face with the patient during this encounter and over half of that time dealt with counseling and coordination of care.    Rubie Maid, MD [facility]

## 2015-04-18 NOTE — Patient Instructions (Signed)

## 2015-04-24 ENCOUNTER — Other Ambulatory Visit: Payer: Self-pay

## 2015-04-24 DIAGNOSIS — I2699 Other pulmonary embolism without acute cor pulmonale: Secondary | ICD-10-CM

## 2015-04-25 ENCOUNTER — Ambulatory Visit: Payer: Self-pay

## 2015-04-25 DIAGNOSIS — I2699 Other pulmonary embolism without acute cor pulmonale: Secondary | ICD-10-CM

## 2015-04-25 LAB — PAP IG AND HPV HIGH-RISK
HPV, high-risk: NEGATIVE
PAP Smear Comment: 0

## 2015-04-25 LAB — PROTIME-INR: INR: 1.3 — AB (ref 0.9–1.1)

## 2015-05-02 ENCOUNTER — Telehealth: Payer: Self-pay

## 2015-05-02 ENCOUNTER — Ambulatory Visit: Payer: Self-pay

## 2015-05-02 DIAGNOSIS — D684 Acquired coagulation factor deficiency: Secondary | ICD-10-CM

## 2015-05-02 LAB — PROTIME-INR: INR: 2.5 — AB (ref 0.9–1.1)

## 2015-05-02 NOTE — Telephone Encounter (Signed)
Dr Mable Fill advised patient to take Eliquis  74m daily versus  Coumadin.  Patient is better off on Eliquis. Spoke to Mrs advised her to take Eliquis and stop taking Coumadin. Pt is able to get Eliquis and has a 60 day supply

## 2015-05-03 ENCOUNTER — Other Ambulatory Visit: Payer: Self-pay

## 2015-05-03 ENCOUNTER — Telehealth: Payer: Self-pay

## 2015-05-03 NOTE — Telephone Encounter (Signed)
Spoke with Ellie Lunch at [facility] and she stated the patient has enough Eliquis until her PAP application is processed and approved. We will not need to bridge with Coumadin. Patient is to only take the Eliquis.

## 2015-05-03 NOTE — Telephone Encounter (Signed)
Jean Morris from [facility] emailed [facility] questioning the patient taking Eliquis and Coumadin. Dr. Valere Dross had instructed the patient to take the Coumadin while Eliquis was being ordered through the PAP process at [facility] this process can take 6 to 8 weeks. There was miscommunication between the patient and MD and the patient thought she needed to take both. She is only to take the coumadin while the Eliquis is being ordered. Jean Morris at [facility] and the patient have been informed.

## 2015-05-08 ENCOUNTER — Other Ambulatory Visit: Payer: Self-pay | Admitting: Internal Medicine

## 2015-05-08 ENCOUNTER — Other Ambulatory Visit: Payer: Self-pay

## 2015-05-09 LAB — CBC WITH DIFFERENTIAL/PLATELET
Basophils Absolute: 0 10*3/uL (ref 0.0–0.2)
Basos: 0 %
EOS (ABSOLUTE): 0.5 10*3/uL — ABNORMAL HIGH (ref 0.0–0.4)
Eos: 5 %
Hematocrit: 32.2 % — ABNORMAL LOW (ref 34.0–46.6)
Hemoglobin: 10.3 g/dL — ABNORMAL LOW (ref 11.1–15.9)
Immature Grans (Abs): 0 10*3/uL (ref 0.0–0.1)
Immature Granulocytes: 0 %
Lymphocytes Absolute: 2.8 10*3/uL (ref 0.7–3.1)
Lymphs: 30 %
MCH: 20.6 pg — ABNORMAL LOW (ref 26.6–33.0)
MCHC: 32 g/dL (ref 31.5–35.7)
MCV: 64 fL — ABNORMAL LOW (ref 79–97)
Monocytes Absolute: 0.4 10*3/uL (ref 0.1–0.9)
Monocytes: 5 %
Neutrophils Absolute: 5.6 10*3/uL (ref 1.4–7.0)
Neutrophils: 60 %
Platelets: 464 10*3/uL — ABNORMAL HIGH (ref 150–379)
RBC: 5 x10E6/uL (ref 3.77–5.28)
RDW: 23.1 % — ABNORMAL HIGH (ref 12.3–15.4)
WBC: 9.4 10*3/uL (ref 3.4–10.8)

## 2015-05-09 LAB — IRON AND TIBC
Iron Saturation: 6 % — CL (ref 15–55)
Iron: 16 ug/dL — ABNORMAL LOW (ref 27–159)
Total Iron Binding Capacity: 271 ug/dL (ref 250–450)
UIBC: 255 ug/dL (ref 131–425)

## 2015-05-10 ENCOUNTER — Ambulatory Visit: Payer: Self-pay

## 2015-05-16 MED ORDER — FERROUS SULFATE 325 (65 FE) MG PO TABS: 325.0000 mg | ORAL_TABLET | Freq: Two times a day (BID) | ORAL | Status: DC

## 2015-05-16 NOTE — Addendum Note (Signed)
Addended by: Mable Fill, Henry Demeritt C on: 05/16/2015 10:42 AM   Modules accepted: Orders

## 2015-05-29 ENCOUNTER — Encounter: Payer: Self-pay | Admitting: Emergency Medicine

## 2015-05-29 ENCOUNTER — Emergency Department: Payer: MEDICAID

## 2015-05-29 ENCOUNTER — Emergency Department
Admission: EM | Admit: 2015-05-29 | Discharge: 2015-05-29 | Disposition: A | Discharge status: Home / Self Care | Payer: MEDICAID | Attending: Emergency Medicine | Admitting: Emergency Medicine

## 2015-05-29 DIAGNOSIS — M79605 Pain in left leg: Secondary | ICD-10-CM | POA: Insufficient documentation

## 2015-05-29 DIAGNOSIS — Z6841 Body Mass Index (BMI) 40.0 and over, adult: Secondary | ICD-10-CM | POA: Insufficient documentation

## 2015-05-29 DIAGNOSIS — Z86718 Personal history of other venous thrombosis and embolism: Secondary | ICD-10-CM | POA: Insufficient documentation

## 2015-05-29 DIAGNOSIS — Z79899 Other long term (current) drug therapy: Secondary | ICD-10-CM | POA: Insufficient documentation

## 2015-05-29 DIAGNOSIS — N939 Abnormal uterine and vaginal bleeding, unspecified: Secondary | ICD-10-CM | POA: Insufficient documentation

## 2015-05-29 DIAGNOSIS — I10 Essential (primary) hypertension: Secondary | ICD-10-CM | POA: Insufficient documentation

## 2015-05-29 DIAGNOSIS — I2699 Other pulmonary embolism without acute cor pulmonale: Secondary | ICD-10-CM | POA: Insufficient documentation

## 2015-05-29 MED ORDER — OXYCODONE-ACETAMINOPHEN 5-325 MG PO TABS
1.0000 | ORAL_TABLET | Freq: Once | ORAL | Status: AC
  Administered 2015-05-29: 1 via ORAL

## 2015-05-29 MED ORDER — OXYCODONE-ACETAMINOPHEN 5-325 MG PO TABS
ORAL_TABLET | ORAL | Status: AC
  Filled 2015-05-29: qty 1

## 2015-05-29 MED ORDER — OXYCODONE HCL 5 MG PO TABS: 5.0000 mg | ORAL_TABLET | ORAL | Status: DC | PRN

## 2015-05-29 NOTE — ED Provider Notes (Signed)
[facility] Emergency Department Provider Note  ____________________________________________  Time seen: Approximately 10:10 PM  I have reviewed the triage vital signs and the nursing notes.   HISTORY  Chief Complaint Leg Pain    HPI Jean Morris is a 40 y.o. 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 The patient denies any shortness of breath, chest pain, lightheadedness or fainting. She denies any trauma, back pain, or skin changes. No fever or chills. No recent change in her exercise regimen. She is able to bear weight.   Past Medical History  Diagnosis Date  . Hypertension   . Anemia   . Menorrhagia   . Pulmonary embolism [facility])     Patient Active Problem List   Diagnosis Date Noted  . Morbid obesity with BMI of 60.0-69.9, adult ([place]) 04/18/2015  . Abnormal uterine bleeding 04/18/2015  . Anemia 04/18/2015  . Pulmonary embolism ([place]) 03/11/2015    Past Surgical History  Procedure Laterality Date  . Cesarean section  1994    Current Outpatient Rx  Name  Route  Sig  Dispense  Refill  . acetaminophen (TYLENOL 8 HOUR) 650 MG CR tablet   Oral   Take 1 tablet (650 mg total) by mouth every 8 (eight) hours as needed for pain.   30 tablet   1   . apixaban (ELIQUIS) 5 MG TABS tablet   Oral   Take 1 tablet (5 mg total) by mouth 2 (two) times daily. Starting 03/19/2015 after loading dose of this medication.   60 tablet   0   . ferrous sulfate 325 (65 FE) MG tablet   Oral   Take 1 tablet (325 mg total) by mouth 2 (two) times daily with a meal.   60 tablet   6   . levonorgestrel (MIRENA) 20 MCG/24HR IUD   Intrauterine   1 Intra Uterine Device (1 each total) by Intrauterine route once.   1 each   0   . oxyCODONE (ROXICODONE) 5 MG immediate release tablet   Oral   Take 1 tablet (5 mg total) by mouth every 4 (four) hours as needed for severe pain.   12 tablet   0      Allergies Review of patient's allergies indicates no known allergies.  Family History  Problem Relation Age of Onset  . Hypertension Other   . Hypertension Mother   . Stroke Mother   . Heart attack Mother   . Hypertension Father     Social History Social History  Substance Use Topics  . Smoking status: Never Smoker   . Smokeless tobacco: None  . Alcohol Use: No    Review of Systems Constitutional: No fever/chills. Lightheadedness or syncope. Eyes: No visual changes. ENT: No sore throat. No congestion or rhinorrhea. Cardiovascular: Denies chest pain. Denies palpitations. Respiratory: Denies shortness of breath.  No cough. Gastrointestinal: No abdominal pain.  No nausea, no vomiting.  No diarrhea.  No constipation. Genitourinary: Negative for dysuria. Musculoskeletal: Negative for back pain. Positive left hip and left thigh pain. Skin: Negative for rash. Neurological: Negative for headaches. No focal numbness, tingling or weakness.   10-point ROS otherwise negative.  ____________________________________________   PHYSICAL EXAM:  VITAL SIGNS: ED Triage Vitals  Enc Vitals Group     BP 05/29/15 1800 129/91 mmHg     Pulse Rate 05/29/15 1800 98     Resp 05/29/15 2123 16     Temp  05/29/15 1800 98.3 F (36.8 C)     Temp Source 05/29/15 1800 Oral     SpO2 05/29/15 1800 98 %     Weight 05/29/15 1800 380 lb (172.367 kg)     Height 05/29/15 1800 5' 4"  (1.626 m)     Head Cir --      Peak Flow --      Pain Score 05/29/15 1801 10     Pain Loc --      Pain Edu? --      Excl. in [place]? --     Constitutional: Alert and oriented. Well appearing and in no acute distress. Answers questions appropriately. Eyes: Conjunctivae are normal.  EOMI. No scleral icterus. Head: Atraumatic. Nose: No congestion/rhinnorhea. Mouth/Throat: Mucous membranes are moist.  Neck: No stridor.  Supple.   Cardiovascular: Normal rate, regular rhythm. No murmurs, rubs or gallops.  Respiratory:  Normal respiratory effort.  No accessory muscle use or retractions. Lungs CTAB.  No wheezes, rales or ronchi. Gastrointestinal: Obese. Soft, nontender and nondistended.  No guarding or rebound.  No peritoneal signs. Musculoskeletal: Patient is morbidly obese and her legs are grossly overweight. She has significant symmetric bilateral lower extremity fat, versus edema although it is not pitting. The patient has no midline lumbar spine tenderness, but she does have tenderness to palpation that is reproducible to the entirety of the left hip and left thigh. She has full range of motion of the left thigh with some pain, and left knee and ankle with no pain. She has Refill less than 2 seconds in the toes and normal DP and PT pulses. Her left lower extremity has normal sensation to light touch. The skin exam on the left lower extremity is normal without any erythema or rash. Neurologic:  A&Ox3.  Speech is clear.  Face and smile are symmetric.  EOMI.  Moves all extremities well. Skin:  Skin is warm, dry and intact. No rash noted. Psychiatric: Mood and affect are normal. Speech and behavior are normal.  Normal judgement.   ____________________________________________   LABS (all labs ordered are listed, but only abnormal results are displayed)  Labs Reviewed - No data to display ____________________________________________  EKG  Not indicated. ____________________________________________  RADIOLOGY  US Venous Img Lower Unilateral Left  05/29/2015  CLINICAL DATA:  Left lower extremity pain for 2 days. EXAM: Left LOWER EXTREMITY VENOUS DOPPLER ULTRASOUND TECHNIQUE: Gray-scale sonography with graded compression, as well as color Doppler and duplex ultrasound were performed to evaluate the lower extremity deep venous systems from the level of the common femoral vein and including the common femoral, femoral, profunda femoral, popliteal and calf veins including the posterior tibial, peroneal and  gastrocnemius veins when visible. The superficial great saphenous vein was also interrogated. Spectral Doppler was utilized to evaluate flow at rest and with distal augmentation maneuvers in the common femoral, femoral and popliteal veins. COMPARISON:  None. FINDINGS: Contralateral Common Femoral Vein: Respiratory phasicity is normal and symmetric with the symptomatic side. No evidence of thrombus. Normal compressibility. Common Femoral Vein: No evidence of thrombus. Normal compressibility, respiratory phasicity and response to augmentation. Saphenofemoral Junction: No evidence of thrombus. Normal compressibility and flow on color Doppler imaging. Profunda Femoral Vein: No evidence of thrombus. Normal compressibility and flow on color Doppler imaging. Femoral Vein: No evidence of thrombus. Normal compressibility, respiratory phasicity and response to augmentation. Popliteal Vein: No evidence of thrombus. Normal compressibility, respiratory phasicity and response to augmentation. Calf Veins: Limited views of the left peroneal vein due to  body habitus. Superficial Great Saphenous Vein: No evidence of thrombus. Normal compressibility and flow on color Doppler imaging. Venous Reflux:  None. Other Findings:  None. IMPRESSION: No evidence of deep venous thrombosis. Electronically Signed   By: Andreas Newport M.D.   On: 05/29/2015 19:33    ____________________________________________   PROCEDURES  Procedure(s) performed: None  Critical Care performed: No ____________________________________________   INITIAL IMPRESSION / ASSESSMENT AND PLAN / ED COURSE  Pertinent labs & imaging results that were available during my care of the patient were reviewed by me and considered in my medical decision making (see chart for details).  40 y.o. F w/ hx of DVT on Eliquis presenting w/ nontraumatic LLE pain x 2d.  [place] does not show DVT.  Physical exam is slightly limited due to morbid obesity, but pain is reproducible  with movement and palpation.  W/O trauma, and with ability to bear weight, acute fx is very unlikely.  Pt is not hypertensive and has no hx of aortic pathology, so dissection is also extremely unlikely.  Plan symptomatic tx and discharge home.  Pt cannot use NSAID's due to Eliquis, so I will instruct her for Tylenol, Oxycodone and heat therapy.  ____________________________________________  FINAL CLINICAL IMPRESSION(S) / ED DIAGNOSES  Final diagnoses:  Left leg pain      NEW MEDICATIONS STARTED DURING THIS VISIT:  New Prescriptions   OXYCODONE (ROXICODONE) 5 MG IMMEDIATE RELEASE TABLET    Take 1 tablet (5 mg total) by mouth every 4 (four) hours as needed for severe pain.     Eula Listen, MD 05/29/15 2223

## 2015-05-29 NOTE — ED Notes (Signed)
Patient presents to the ED with upper left leg pain that began yesterday.  Patient reports history of PE and is on Eloquis.  Patient denies any other pain.  Denies shortness of breath.  Patient denies any noticeable redness or swelling to leg.

## 2015-05-29 NOTE — Discharge Instructions (Signed)
Please apply a heating pad to your left hip for 10 minutes every 2 hours. For mild to moderate pain, you may take Tylenol. For severe pain you may take oxycodone. Please be careful when taking oxycodone because it may make you unsteady on your feet. You're not cleared to drive until your leg pain has completely resolved, and within 8 hours of taking oxycodone.  Please return to the emergency department if you develop severe pain, chest pain, abdominal or back pain, inability to walk, shortness of breath, lightheadedness or fever, or any other symptoms concerning to you.

## 2015-07-11 ENCOUNTER — Ambulatory Visit: Payer: Self-pay | Admitting: Internal Medicine

## 2015-07-11 ENCOUNTER — Encounter: Payer: Self-pay | Admitting: Internal Medicine

## 2015-07-11 VITALS — BP 132/82 | HR 86 | Temp 98.5°F | Wt 382.0 lb

## 2015-07-11 DIAGNOSIS — D500 Iron deficiency anemia secondary to blood loss (chronic): Secondary | ICD-10-CM

## 2015-07-11 MED ORDER — FERROUS SULFATE 325 (65 FE) MG PO TABS: 325.0000 mg | ORAL_TABLET | Freq: Two times a day (BID) | ORAL | Status: DC

## 2015-07-11 MED ORDER — APIXABAN 5 MG PO TABS: 5.0000 mg | ORAL_TABLET | Freq: Two times a day (BID) | ORAL | Status: DC

## 2015-07-11 NOTE — Progress Notes (Signed)
Subjective:    Patient ID: Jean Morris, female    DOB: 06/17/1975, 40 y.o.   MRN: [id number]  HPI  Patient Active Problem List   Diagnosis Date Noted  . Morbid obesity with BMI of 60.0-69.9, adult ([place]) 04/18/2015  . Abnormal uterine bleeding 04/18/2015  . Anemia 04/18/2015  . Pulmonary embolism ([place]) 03/11/2015   Pt has been having problems with her legs. Went to the ED last month and was told arthritis. There were no blood clots present. In the past week, she was having issues walking. Pt can walk today but with a limp. Pain going from her hip down to her ankle.   Pt had a past blood clot that moved into the lungs. Claims that physicians told her that it was from taking birth control. Pt is on blood thinner for 6 months (Jan-Jul)  She is not have bleeding from the uterus anymore. Pt is taking iron supplements 1x per day.  Review of Systems     Objective:   Physical Exam  Constitutional: She is oriented to person, place, and time.  Cardiovascular: Normal rate, regular rhythm and normal heart sounds.   Pulmonary/Chest: Effort normal and breath sounds normal.  Neurological: She is alert and oriented to person, place, and time.   BP 132/82 mmHg  Pulse 86  Temp(Src) 98.5 F (36.9 C)  Wt 382 lb (173.274 kg)  Outpatient Encounter Prescriptions as of 07/11/2015  Medication Sig  . acetaminophen (TYLENOL 8 HOUR) 650 MG CR tablet Take 1 tablet (650 mg total) by mouth every 8 (eight) hours as needed for pain.  Marland Kitchen apixaban (ELIQUIS) 5 MG TABS tablet Take 1 tablet (5 mg total) by mouth 2 (two) times daily. Starting 03/19/2015 after loading dose of this medication.  . ferrous sulfate 325 (65 FE) MG tablet Take 1 tablet (325 mg total) by mouth 2 (two) times daily with a meal.  . levonorgestrel (MIRENA) 20 MCG/24HR IUD 1 Intra Uterine Device (1 each total) by Intrauterine route once.  . [DISCONTINUED] oxyCODONE (ROXICODONE) 5 MG immediate release tablet Take 1 tablet (5 mg total) by  mouth every 4 (four) hours as needed for severe pain. (Patient not taking: Reported on 07/11/2015)   No facility-administered encounter medications on file as of 07/11/2015.      Assessment & Plan:  Blood pressure good.   Unknown reason for leg pain if blood clots were ruled out. May have strained it.   Will return for MD appt in August to check on blood thinners. May be able to discontinue medication.   Haile was seen today for leg pain.  Diagnoses and all orders for this visit:  Iron deficiency anemia due to chronic blood loss -     CBC w/Diff; Future -     CBC w/Diff -     Iron; Future -     Comp Met (CMET); Future -     Uric acid; Future -     Lipid panel; Future -     TSH; Future  Other orders -     apixaban (ELIQUIS) 5 MG TABS tablet; Take 1 tablet (5 mg total) by mouth 2 (two) times daily. Starting 03/19/2015 after loading dose of this medication. -     ferrous sulfate 325 (65 FE) MG tablet; Take 1 tablet (325 mg total) by mouth 2 (two) times daily with a meal.   Needs CBC ASAP  MD f/u in 3 months with repeat CBC, Iron, Met c, uric acid, lipid  panel, tsh

## 2015-07-30 ENCOUNTER — Encounter: Payer: Self-pay | Admitting: Emergency Medicine

## 2015-07-30 ENCOUNTER — Emergency Department
Admission: EM | Admit: 2015-07-30 | Discharge: 2015-07-30 | Disposition: A | Discharge status: Home / Self Care | Payer: Self-pay | Attending: Emergency Medicine | Admitting: Emergency Medicine

## 2015-07-30 ENCOUNTER — Emergency Department: Payer: Self-pay

## 2015-07-30 DIAGNOSIS — R519 Headache, unspecified: Secondary | ICD-10-CM

## 2015-07-30 DIAGNOSIS — Z79899 Other long term (current) drug therapy: Secondary | ICD-10-CM | POA: Insufficient documentation

## 2015-07-30 DIAGNOSIS — Z86711 Personal history of pulmonary embolism: Secondary | ICD-10-CM | POA: Insufficient documentation

## 2015-07-30 DIAGNOSIS — R531 Weakness: Secondary | ICD-10-CM | POA: Insufficient documentation

## 2015-07-30 DIAGNOSIS — I10 Essential (primary) hypertension: Secondary | ICD-10-CM | POA: Insufficient documentation

## 2015-07-30 DIAGNOSIS — R51 Headache: Secondary | ICD-10-CM | POA: Insufficient documentation

## 2015-07-30 LAB — CBC
HCT: 34.5 % — ABNORMAL LOW (ref 35.0–47.0)
Hemoglobin: 11.2 g/dL — ABNORMAL LOW (ref 12.0–16.0)
MCH: 21.3 pg — ABNORMAL LOW (ref 26.0–34.0)
MCHC: 32.5 g/dL (ref 32.0–36.0)
MCV: 65.6 fL — ABNORMAL LOW (ref 80.0–100.0)
Platelets: 431 10*3/uL (ref 150–440)
RBC: 5.26 MIL/uL — ABNORMAL HIGH (ref 3.80–5.20)
RDW: 19.3 % — ABNORMAL HIGH (ref 11.5–14.5)
WBC: 7.3 10*3/uL (ref 3.6–11.0)

## 2015-07-30 LAB — COMPREHENSIVE METABOLIC PANEL
ALT: 13 U/L — ABNORMAL LOW (ref 14–54)
AST: 18 U/L (ref 15–41)
Albumin: 3.5 g/dL (ref 3.5–5.0)
Alkaline Phosphatase: 70 U/L (ref 38–126)
Anion gap: 5 (ref 5–15)
BUN: 10 mg/dL (ref 6–20)
CO2: 25 mmol/L (ref 22–32)
Calcium: 8.8 mg/dL — ABNORMAL LOW (ref 8.9–10.3)
Chloride: 104 mmol/L (ref 101–111)
Creatinine, Ser: 0.75 mg/dL (ref 0.44–1.00)
GFR calc Af Amer: >60 mL/min (ref >60)
GFR calc non Af Amer: >60 mL/min (ref >60)
Glucose, Bld: 110 mg/dL — ABNORMAL HIGH (ref 65–99)
Potassium: 3.3 mmol/L — ABNORMAL LOW (ref 3.5–5.1)
Sodium: 134 mmol/L — ABNORMAL LOW (ref 135–145)
Total Bilirubin: 0.3 mg/dL (ref 0.3–1.2)
Total Protein: 8.1 g/dL (ref 6.5–8.1)

## 2015-07-30 LAB — TROPONIN I: Troponin I: <0.03 ng/mL (ref <0.031)

## 2015-07-30 MED ORDER — BUTALBITAL-APAP-CAFFEINE 50-325-40 MG PO TABS
2.0000 | ORAL_TABLET | Freq: Once | ORAL | Status: AC
  Administered 2015-07-30: 2 via ORAL
  Filled 2015-07-30: qty 2

## 2015-07-30 MED ORDER — BUTALBITAL-APAP-CAFFEINE 50-325-40 MG PO TABS: 1.0000 | ORAL_TABLET | Freq: Four times a day (QID) | ORAL | Status: AC | PRN

## 2015-07-30 NOTE — ED Notes (Signed)
Headache began this am, doesn't normally has headaches. States has taken blood thinners, eloquis, until one week ago. Denies falls. General weakness.

## 2015-07-30 NOTE — Discharge Instructions (Signed)
General Headache Without Cause A headache is pain or discomfort felt around the head or neck area. The specific cause of a headache may not be found. There are many causes and types of headaches. A few common ones are:  Tension headaches.  Migraine headaches.  Cluster headaches.  Chronic daily headaches. HOME CARE INSTRUCTIONS  Watch your condition for any changes. Take these steps to help with your condition: Managing Pain  Take over-the-counter and prescription medicines only as told by your health care provider.  Lie down in a dark, quiet room when you have a headache.  If directed, apply ice to the head and neck area:  Put ice in a plastic bag.  Place a towel between your skin and the bag.  Leave the ice on for 20 minutes, 2-3 times per day.  Use a heating pad or hot shower to apply heat to the head and neck area as told by your health care provider.  Keep lights dim if bright lights bother you or make your headaches worse. Eating and Drinking  Eat meals on a regular schedule.  Limit alcohol use.  Decrease the amount of caffeine you drink, or stop drinking caffeine. General Instructions  Keep all follow-up visits as told by your health care provider. This is important.  Keep a headache journal to help find out what may trigger your headaches. For example, write down:  What you eat and drink.  How much sleep you get.  Any change to your diet or medicines.  Try massage or other relaxation techniques.  Limit stress.  Sit up straight, and do not tense your muscles.  Do not use tobacco products, including cigarettes, chewing tobacco, or e-cigarettes. If you need help quitting, ask your health care provider.  Exercise regularly as told by your health care provider.  Sleep on a regular schedule. Get 7-9 hours of sleep, or the amount recommended by your health care provider. SEEK MEDICAL CARE IF:   Your symptoms are not helped by medicine.  You have a  headache that is different from the usual headache.  You have nausea or you vomit.  You have a fever. SEEK IMMEDIATE MEDICAL CARE IF:   Your headache becomes severe.  You have repeated vomiting.  You have a stiff neck.  You have a loss of vision.  You have problems with speech.  You have pain in the eye or ear.  You have muscular weakness or loss of muscle control.  You lose your balance or have trouble walking.  You feel faint or pass out.  You have confusion.   This information is not intended to replace advice given to you by your health care provider. Make sure you discuss any questions you have with your health care provider.   Document Released: 02/10/2005 Document Revised: 11/01/2014 Document Reviewed: 06/05/2014 Elsevier Interactive Patient Education 2016 [employer]  Weakness Weakness is a lack of strength. You may feel weak all over your body or just in one part of your body. Weakness can be serious. In some cases, you may need more medical tests. HOME [place] a well-balanced diet.  Try to exercise every day.  Only take medicines as told by your doctor. GET HELP RIGHT AWAY IF:   You cannot do your normal daily activities.  You cannot walk up and down stairs, or you feel very tired when you do so.  You have shortness of breath or chest pain.  You have trouble moving parts of  your body.  You have weakness in only one body part or on only one side of the body.  You have a fever.  You have trouble speaking or swallowing.  You cannot control when you pee (urinate) or poop (bowel movement).  You have black or bloody throw up (vomit) or poop.  Your weakness gets worse or spreads to other body parts.  You have new aches or pains. MAKE SURE YOU:   Understand these instructions.  Will watch your condition.  Will get help right away if you are not doing well or get worse.   This information is not intended to replace advice given to  you by your health care provider. Make sure you discuss any questions you have with your health care provider.   Document Released: 01/24/2008 Document Revised: 08/12/2011 Document Reviewed: 04/11/2011 Elsevier Interactive Patient Education [employer].

## 2015-07-30 NOTE — ED Provider Notes (Signed)
[facility] Emergency Department Provider Note  Time seen: 2:26 PM  I have reviewed the triage vital signs and the nursing notes.   HISTORY  Chief Complaint Headache    HPI Jean Morris is a 40 y.o. female with a past medical history hypertension, anemia, PE, who presents to the emergency department with a headache. According to the patient since last night she has had a dull frontal headache, it was worse this morning, but has progressively improved throughout the day. She states 10/10 pain this morning however currently a 6/10 without intervention. Patient was taking liquids for a pulmonary embolism, but this was discontinued 1 week ago by her doctor. The patient states she will occasionally get a headache which resolves with Tylenol or Motrin, but rarely gets a headache this bad. States the headache is not that bad any more but it was earlier today. Patient also states a feeling of generalized fatigue/weakness for the past 24 hours. Denies any nausea, vomiting, chest pain, abdominal pain, dysuria. Currently describes her headache as moderate dull pain.   Past Medical History  Diagnosis Date  . Hypertension   . Anemia   . Menorrhagia   . Pulmonary embolism [facility])     Patient Active Problem List   Diagnosis Date Noted  . Morbid obesity with BMI of 60.0-69.9, adult ([place]) 04/18/2015  . Abnormal uterine bleeding 04/18/2015  . Anemia 04/18/2015  . Pulmonary embolism ([place]) 03/11/2015    Past Surgical History  Procedure Laterality Date  . Cesarean section  1994    Current Outpatient Rx  Name  Route  Sig  Dispense  Refill  . acetaminophen (TYLENOL 8 HOUR) 650 MG CR tablet   Oral   Take 1 tablet (650 mg total) by mouth every 8 (eight) hours as needed for pain.   30 tablet   1   . apixaban (ELIQUIS) 5 MG TABS tablet   Oral   Take 1 tablet (5 mg total) by mouth 2 (two) times daily. Starting 03/19/2015 after loading dose of this medication.   60 tablet  2   . ferrous sulfate 325 (65 FE) MG tablet   Oral   Take 1 tablet (325 mg total) by mouth 2 (two) times daily with a meal.   60 tablet   6   . levonorgestrel (MIRENA) 20 MCG/24HR IUD   Intrauterine   1 Intra Uterine Device (1 each total) by Intrauterine route once.   1 each   0     Allergies Review of patient's allergies indicates no known allergies.  Family History  Problem Relation Age of Onset  . Hypertension Other   . Hypertension Mother   . Stroke Mother   . Heart attack Mother   . Hypertension Father     Social History Social History  Substance Use Topics  . Smoking status: Never Smoker   . Smokeless tobacco: None  . Alcohol Use: No    Review of Systems Constitutional: Negative for fever. Positive for generalized weakness. Cardiovascular: Negative for chest pain. Respiratory: Negative for shortness of breath. Gastrointestinal: Negative for abdominal pain Genitourinary: Negative for dysuria. Musculoskeletal: Negative for back pain. Skin: Negative for rash. Neurological: Moderate headache. 10-point ROS otherwise negative.  ____________________________________________   PHYSICAL EXAM:  VITAL SIGNS: ED Triage Vitals  Enc Vitals Group     BP 07/30/15 1243 154/82 mmHg     Pulse Rate 07/30/15 1243 102     Resp 07/30/15 1243 20     Temp 07/30/15  1243 97.2 F (36.2 C)     Temp Source 07/30/15 1243 Oral     SpO2 07/30/15 1243 98 %     Weight 07/30/15 1243 382 lb (173.274 kg)     Height 07/30/15 1243 5' 5"  (1.651 m)     Head Cir --      Peak Flow --      Pain Score --      Pain Loc --      Pain Edu? --      Excl. in [place]? --     Constitutional: Alert and oriented. Well appearing and in no distress. Eyes: Normal exam ENT   Head: Normocephalic and atraumatic.   Mouth/Throat: Mucous membranes are moist. Cardiovascular: Normal rate, regular rhythm. No murmur Respiratory: Normal respiratory effort without tachypnea nor retractions. Breath  sounds are clear  Gastrointestinal: Soft and nontender. No distention.  Obese. Musculoskeletal: Nontender with normal range of motion in all extremities.  Neurologic:  Normal speech and language. No gross focal neurologic deficits. Equal grip strengths. 5/5 motor in all extremities. No pronator drift. Finger-nose testing intact. Cranial nerves intact. Skin:  Skin is warm, dry and intact.  Psychiatric: Mood and affect are normal. Speech and behavior are normal.   ____________________________________________     RADIOLOGY  CT shows no acute abnormality  EKG reviewed and interpreted by myself shows normal sinus rhythm at 93 bpm, narrow QRS, normal axis, normal intervals, nonspecific ST changes without ST elevation.  INITIAL IMPRESSION / ASSESSMENT AND PLAN / ED COURSE  Pertinent labs & imaging results that were available during my care of the patient were reviewed by me and considered in my medical decision making (see chart for details).  CT scan shows no acute abnormality. Patient's blood work is largely within normal limits. Patient has an intact neurologic exam. States moderate headache at this time, much improved from earlier today. We will dose Fioricet in the emergency department and monitor for improvement. Overall the patient appears well, no acute distress.  Labs within normal limits. CT is normal. Patient states her headache is improving after Fioricet. We'll discharge home with the same, as needed. Patient will follow up with her PCP.  ____________________________________________   FINAL CLINICAL IMPRESSION(S) / ED DIAGNOSES  Headache Generalized weakness   Harvest Dark, MD 07/30/15 1450

## 2015-08-28 ENCOUNTER — Emergency Department: Payer: Self-pay

## 2015-08-28 ENCOUNTER — Emergency Department
Admission: EM | Admit: 2015-08-28 | Discharge: 2015-08-29 | Disposition: A | Discharge status: Home / Self Care | Payer: Self-pay | Attending: Emergency Medicine | Admitting: Emergency Medicine

## 2015-08-28 ENCOUNTER — Encounter: Payer: Self-pay | Admitting: Emergency Medicine

## 2015-08-28 DIAGNOSIS — R911 Solitary pulmonary nodule: Secondary | ICD-10-CM | POA: Insufficient documentation

## 2015-08-28 DIAGNOSIS — Z79899 Other long term (current) drug therapy: Secondary | ICD-10-CM | POA: Insufficient documentation

## 2015-08-28 DIAGNOSIS — I10 Essential (primary) hypertension: Secondary | ICD-10-CM | POA: Insufficient documentation

## 2015-08-28 DIAGNOSIS — Z8679 Personal history of other diseases of the circulatory system: Secondary | ICD-10-CM | POA: Insufficient documentation

## 2015-08-28 DIAGNOSIS — M7918 Myalgia, other site: Secondary | ICD-10-CM

## 2015-08-28 DIAGNOSIS — M791 Myalgia: Secondary | ICD-10-CM | POA: Insufficient documentation

## 2015-08-28 LAB — COMPREHENSIVE METABOLIC PANEL
ALT: 12 U/L — ABNORMAL LOW (ref 14–54)
AST: 16 U/L (ref 15–41)
Albumin: 3.5 g/dL (ref 3.5–5.0)
Alkaline Phosphatase: 69 U/L (ref 38–126)
Anion gap: 8 (ref 5–15)
BUN: 9 mg/dL (ref 6–20)
CO2: 27 mmol/L (ref 22–32)
Calcium: 9.3 mg/dL (ref 8.9–10.3)
Chloride: 103 mmol/L (ref 101–111)
Creatinine, Ser: 0.7 mg/dL (ref 0.44–1.00)
GFR calc Af Amer: >60 mL/min (ref >60)
GFR calc non Af Amer: >60 mL/min (ref >60)
Glucose, Bld: 110 mg/dL — ABNORMAL HIGH (ref 65–99)
Potassium: 3.7 mmol/L (ref 3.5–5.1)
Sodium: 138 mmol/L (ref 135–145)
Total Bilirubin: 0.5 mg/dL (ref 0.3–1.2)
Total Protein: 8.3 g/dL — ABNORMAL HIGH (ref 6.5–8.1)

## 2015-08-28 LAB — CBC WITH DIFFERENTIAL/PLATELET
Basophils Absolute: 0.1 10*3/uL (ref 0–0.1)
Basophils Relative: 1 %
Eosinophils Absolute: 0.3 10*3/uL (ref 0–0.7)
Eosinophils Relative: 3 %
HCT: 34.1 % — ABNORMAL LOW (ref 35.0–47.0)
Hemoglobin: 11.5 g/dL — ABNORMAL LOW (ref 12.0–16.0)
Lymphocytes Relative: 37 %
Lymphs Abs: 2.9 10*3/uL (ref 1.0–3.6)
MCH: 21.9 pg — ABNORMAL LOW (ref 26.0–34.0)
MCHC: 33.8 g/dL (ref 32.0–36.0)
MCV: 64.7 fL — ABNORMAL LOW (ref 80.0–100.0)
Monocytes Absolute: 0.5 10*3/uL (ref 0.2–0.9)
Monocytes Relative: 6 %
Neutro Abs: 4.2 10*3/uL (ref 1.4–6.5)
Neutrophils Relative %: 53 %
Platelets: 404 10*3/uL (ref 150–440)
RBC: 5.27 MIL/uL — ABNORMAL HIGH (ref 3.80–5.20)
RDW: 18.6 % — ABNORMAL HIGH (ref 11.5–14.5)
WBC: 7.9 10*3/uL (ref 3.6–11.0)

## 2015-08-28 LAB — PROTIME-INR
INR: 1.1
Prothrombin Time: 14.4 seconds (ref 11.4–15.0)

## 2015-08-28 LAB — TROPONIN I: Troponin I: <0.03 ng/mL (ref <0.03)

## 2015-08-28 MED ORDER — IOPAMIDOL (ISOVUE-370) INJECTION 76%
100.0000 mL | Freq: Once | INTRAVENOUS | Status: AC | PRN
  Administered 2015-08-28: 100 mL via INTRAVENOUS

## 2015-08-28 NOTE — ED Provider Notes (Signed)
[facility] Emergency Department Provider Note  ____________________________________________   I have reviewed the triage vital signs and the nursing notes.   HISTORY  Chief Complaint Back Pain    HPI Jean Morris is a 40 y.o. female who has a history of morbid obesity, PE in the past, upper tension presents today complaining of muscular skeletal pain in her back. At least that is how she describes it. However, patient has recently stopped taking her liquids and she is worried that this could be a PE. She does not chest pain or shortness of breath. However she thinks that her PE felt possibly similar to this discomfort which she is having in the left upper back. She denies any chest pain or shortness of breath. It hurts when she changes position or when she touches it. She consoles by the exact muscle group that is causing her the discomfort. Patient states she woke up with it and feels that this is because she slept on it wrong.     Past Medical History  Diagnosis Date  . Hypertension   . Anemia   . Menorrhagia   . Pulmonary embolism [facility])     Patient Active Problem List   Diagnosis Date Noted  . Morbid obesity with BMI of 60.0-69.9, adult ([place]) 04/18/2015  . Abnormal uterine bleeding 04/18/2015  . Anemia 04/18/2015  . Pulmonary embolism ([place]) 03/11/2015    Past Surgical History  Procedure Laterality Date  . Cesarean section  1994    Current Outpatient Rx  Name  Route  Sig  Dispense  Refill  . acetaminophen (TYLENOL 8 HOUR) 650 MG CR tablet   Oral   Take 1 tablet (650 mg total) by mouth every 8 (eight) hours as needed for pain.   30 tablet   1   . apixaban (ELIQUIS) 5 MG TABS tablet   Oral   Take 1 tablet (5 mg total) by mouth 2 (two) times daily. Starting 03/19/2015 after loading dose of this medication.   60 tablet   2   . butalbital-acetaminophen-caffeine (FIORICET) 50-325-40 MG tablet   Oral   Take 1-2 tablets by mouth every 6  (six) hours as needed for headache.   20 tablet   0   . ferrous sulfate 325 (65 FE) MG tablet   Oral   Take 1 tablet (325 mg total) by mouth 2 (two) times daily with a meal.   60 tablet   6   . levonorgestrel (MIRENA) 20 MCG/24HR IUD   Intrauterine   1 Intra Uterine Device (1 each total) by Intrauterine route once.   1 each   0     Allergies Review of patient's allergies indicates no known allergies.  Family History  Problem Relation Age of Onset  . Hypertension Other   . Hypertension Mother   . Stroke Mother   . Heart attack Mother   . Hypertension Father     Social History Social History  Substance Use Topics  . Smoking status: Never Smoker   . Smokeless tobacco: None  . Alcohol Use: No    Review of Systems Constitutional: No fever/chills Eyes: No visual changes. ENT: No sore throat. No stiff neck no neck pain Cardiovascular: Denies chest pain. Respiratory: Denies shortness of breath. Gastrointestinal:   no vomiting.  No diarrhea.  No constipation. Genitourinary: Negative for dysuria. Musculoskeletal: Negative lower extremity swelling Skin: Negative for rash. Neurological: Negative for headaches, focal weakness or numbness. 10-point ROS otherwise negative.  ____________________________________________  PHYSICAL EXAM:  VITAL SIGNS: ED Triage Vitals  Enc Vitals Group     BP 08/28/15 2030 140/81 mmHg     Pulse Rate 08/28/15 2030 100     Resp 08/28/15 2030 20     Temp 08/28/15 2030 97.7 F (36.5 C)     Temp Source 08/28/15 2030 Oral     SpO2 08/28/15 2030 100 %     Weight 08/28/15 2030 380 lb (172.367 kg)     Height 08/28/15 2030 5' 5"  (1.651 m)     Head Cir --      Peak Flow --      Pain Score 08/28/15 2030 8     Pain Loc --      Pain Edu? --      Excl. in [place]? --     Constitutional: Alert and oriented. Well appearing and in no acute distress. Eyes: Conjunctivae are normal. PERRL. EOMI. Head: Atraumatic. Nose: No  congestion/rhinnorhea. Mouth/Throat: Mucous membranes are moist.  Oropharynx non-erythematous. Neck: No stridor.   Nontender with no meningismus Cardiovascular: Normal rate, regular rhythm. Grossly normal heart sounds.  Good peripheral circulation. Respiratory: Normal respiratory effort.  No retractions. Lungs CTAB. Abdominal: Soft and nontender. No distention. No guarding no rebound Back:  Tenderness to palpation in the paraspinal muscles of the upper left back in the low trapezius, rhomboid region. When I touch this area patient states "ouch that's the pain right there". There is mild muscle spasm noted there is no midline tenderness there are no lesions noted. there is no CVA tenderness Musculoskeletal: No lower extremity tenderness. No joint effusions, no DVT signs strong distal pulses no edema Neurologic:  Normal speech and language. No gross focal neurologic deficits are appreciated.  Skin:  Skin is warm, dry and intact. No rash noted. Psychiatric: Mood and affect are normal. Speech and behavior are normal.  ____________________________________________   LABS (all labs ordered are listed, but only abnormal results are displayed)  Labs Reviewed  COMPREHENSIVE METABOLIC PANEL - Abnormal; Notable for the following:    Glucose, Bld 110 (*)    Total Protein 8.3 (*)    ALT 12 (*)    All other components within normal limits  CBC WITH DIFFERENTIAL/PLATELET - Abnormal; Notable for the following:    RBC 5.27 (*)    Hemoglobin 11.5 (*)    HCT 34.1 (*)    MCV 64.7 (*)    MCH 21.9 (*)    RDW 18.6 (*)    All other components within normal limits  PROTIME-INR   ____________________________________________  EKG  I personally interpreted any EKGs ordered by me or triage Sinus rate 98 no acute ST elevation or acute ST depression normal axis unremarkable EKG nonspecific ST changes ____________________________________________  RADIOLOGY  I reviewed any imaging ordered by me or triage  that were performed during my shift and, if possible, patient and/or family made aware of any abnormal findings. ____________________________________________   PROCEDURES  Procedure(s) performed: None  Critical Care performed: None  ____________________________________________   INITIAL IMPRESSION / ASSESSMENT AND PLAN / ED COURSE  Pertinent labs & imaging results that were available during my care of the patient were reviewed by me and considered in my medical decision making (see chart for details).   Patient with reproducible back pain, I do not suspect this likely is a blood clot. Suspect it is ACS or dissection. Patient herself feels that way. However, patient is morbidly obese, has a history of PE and is concerned that this  may feel similar. With some reluctance therefore we will image her, as I cannot rule out by other means. Nothing at this reproducible back pain to suggest ACS PE or dissection ____________________________________________   FINAL CLINICAL IMPRESSION(S) / ED DIAGNOSES  Final diagnoses:  None      This chart was dictated using voice recognition software.  Despite best efforts to proofread,  errors can occur which can change meaning.     Schuyler Amor, MD 08/28/15 2240

## 2015-08-28 NOTE — ED Notes (Signed)
Pt to triage via wheelchair with c/o pain in right neck and upper back (8/10), started this am, worse with right arm movement above the shoulder.  Pt reports hx of PE last March and wants to get checked out d/t that history.  Pt reports running out of prescribed Eloquis about 2 weeks ago and is concerned about this as well.    Pt denies N/V/SOB/dizziness or chest pain and no cardiac history.

## 2015-08-28 NOTE — ED Notes (Signed)
Pt back from CT

## 2015-08-29 MED ORDER — CYCLOBENZAPRINE HCL 10 MG PO TABS: 10.0000 mg | ORAL_TABLET | Freq: Three times a day (TID) | ORAL | Status: DC | PRN

## 2015-08-29 NOTE — ED Notes (Signed)
Pt discharged to home.  Family member driving.  Discharge instructions reviewed.  Verbalized understanding.  No questions or concerns at this time.  Teach back verified.  Pt in NAD.  No items left in ED.   

## 2015-08-29 NOTE — ED Provider Notes (Signed)
I assumed care of the patient at 11:00 PM from Dr. Burlene Arnt with regulation of follow-up CT scan PE protocol which revealed:  CT Angio Chest PE W/Cm &/Or Wo Cm (Final result) Result time: 08/28/15 23:36:34   Final result by Rad Results In Interface (08/28/15 23:36:34)   Narrative:   CLINICAL DATA: Back pain. History of pulmonary embolus with similar symptom at that time.  EXAM: CT ANGIOGRAPHY CHEST WITH CONTRAST  TECHNIQUE: Multidetector CT imaging of the chest was performed using the standard protocol during bolus administration of intravenous contrast. Multiplanar CT image reconstructions and MIPs were obtained to evaluate the vascular anatomy.  CONTRAST: 100 cc Isovue 370 IV  COMPARISON: Chest CT 03/11/2015  FINDINGS: Cardiovascular: Previous left lower lobe pulmonary artery filling defects have resolved. No new filling defects in the pulmonary arteries to suggest new pulmonary embolus. Thoracic aorta is normal in caliber. Common origin of the left common carotid and brachiocephalic artery, normal variant bovine configuration. The right vertebral artery arises directly from the aorta.  Mediastinum/Nodes: No mediastinal or hilar adenopathy. No mediastinal mass. No pericardial effusion.  Lungs/Pleura: No consolidation, evidence pulmonary edema or pleural effusion. In the medial right lower lobe there is a 5 x 5 mm nodule (5 mm mean diameter) on image 106 of series 7. This was not seen on prior exam. Tiny subpleural nodule in the left lower lobe image 72 series 7 was previously obscured.  Upper Abdomen: No acute abnormality.  Musculoskeletal: There are no acute or suspicious osseous abnormalities. Minimal degenerative disc disease in the lower thoracic spine. Degenerative change of the right glenohumeral joint.  Review of the MIP images confirms the above findings.  IMPRESSION: 1. No acute door persistent pulmonary embolus. Previous pulmonary emboli have  resolved. 2. No acute intrathoracic process. 3. New right lower lobe 5 mm pulmonary nodule. This is likely infectious or inflammatory given patient's age. No follow-up needed if patient is low-risk (and has no known or suspected primary neoplasm). Non-contrast chest CT can be considered in 12 months if patient is high-risk. This recommendation follows the consensus statement: Guidelines for Management of Incidental Pulmonary Nodules Detected on CT Images:From the Fleischner Society 2017; published online before print (10.1148/radiol.3734287681).   Electronically Signed By: Jeb Levering M.D. On: 08/28/2015 23:36   I informed the patient of the CT scan findings. Patient states that her discomfort is much improved. I concur with Dr. Burlene Arnt that etiology of the patient's pain is more than likely musculoskeletal in etiology.  Gregor Hams, MD 08/29/15 [id number]

## 2015-08-29 NOTE — ED Notes (Signed)
Pt up to restroom.

## 2015-08-29 NOTE — Discharge Instructions (Signed)

## 2015-10-10 ENCOUNTER — Other Ambulatory Visit: Payer: Self-pay

## 2015-10-11 ENCOUNTER — Other Ambulatory Visit: Payer: Self-pay

## 2015-10-11 DIAGNOSIS — D500 Iron deficiency anemia secondary to blood loss (chronic): Secondary | ICD-10-CM

## 2015-10-12 LAB — CBC WITH DIFFERENTIAL/PLATELET
Basophils Absolute: 0 10*3/uL (ref 0.0–0.2)
Basos: 0 %
EOS (ABSOLUTE): 0.3 10*3/uL (ref 0.0–0.4)
Eos: 4 %
Hematocrit: 35.6 % (ref 34.0–46.6)
Hemoglobin: 11.6 g/dL (ref 11.1–15.9)
Immature Grans (Abs): 0 10*3/uL (ref 0.0–0.1)
Immature Granulocytes: 0 %
Lymphocytes Absolute: 2.9 10*3/uL (ref 0.7–3.1)
Lymphs: 40 %
MCH: 22 pg — ABNORMAL LOW (ref 26.6–33.0)
MCHC: 32.6 g/dL (ref 31.5–35.7)
MCV: 68 fL — ABNORMAL LOW (ref 79–97)
Monocytes Absolute: 0.4 10*3/uL (ref 0.1–0.9)
Monocytes: 6 %
Neutrophils Absolute: 3.5 10*3/uL (ref 1.4–7.0)
Neutrophils: 50 %
Platelets: 462 10*3/uL — ABNORMAL HIGH (ref 150–379)
RBC: 5.27 x10E6/uL (ref 3.77–5.28)
RDW: 20.2 % — ABNORMAL HIGH (ref 12.3–15.4)
WBC: 7.1 10*3/uL (ref 3.4–10.8)

## 2015-10-12 LAB — COMPREHENSIVE METABOLIC PANEL
ALT: 8 IU/L (ref 0–32)
AST: 11 IU/L (ref 0–40)
Albumin/Globulin Ratio: 0.8 — ABNORMAL LOW (ref 1.2–2.2)
Albumin: 3.6 g/dL (ref 3.5–5.5)
Alkaline Phosphatase: 82 IU/L (ref 39–117)
BUN/Creatinine Ratio: 11 (ref 9–23)
BUN: 9 mg/dL (ref 6–24)
Bilirubin Total: 0.3 mg/dL (ref 0.0–1.2)
CO2: 20 mmol/L (ref 18–29)
Calcium: 9.3 mg/dL (ref 8.7–10.2)
Chloride: 97 mmol/L (ref 96–106)
Creatinine, Ser: 0.79 mg/dL (ref 0.57–1.00)
GFR calc Af Amer: 107 mL/min/{1.73_m2} (ref >59)
GFR calc non Af Amer: 93 mL/min/{1.73_m2} (ref >59)
Globulin, Total: 4.4 g/dL (ref 1.5–4.5)
Glucose: 101 mg/dL — ABNORMAL HIGH (ref 65–99)
Potassium: 4 mmol/L (ref 3.5–5.2)
Sodium: 137 mmol/L (ref 134–144)
Total Protein: 8 g/dL (ref 6.0–8.5)

## 2015-10-12 LAB — LIPID PANEL
Chol/HDL Ratio: 3.7 ratio units (ref 0.0–4.4)
Cholesterol, Total: 185 mg/dL (ref 100–199)
HDL: 50 mg/dL (ref >39)
LDL Calculated: 97 mg/dL (ref 0–99)
Triglycerides: 192 mg/dL — ABNORMAL HIGH (ref 0–149)
VLDL Cholesterol Cal: 38 mg/dL (ref 5–40)

## 2015-10-12 LAB — IRON: Iron: 18 ug/dL — ABNORMAL LOW (ref 27–159)

## 2015-10-12 LAB — URIC ACID: Uric Acid: 4.8 mg/dL (ref 2.5–7.1)

## 2015-10-12 LAB — TSH: TSH: 2.6 u[IU]/mL (ref 0.450–4.500)

## 2015-10-16 ENCOUNTER — Other Ambulatory Visit: Payer: Self-pay

## 2015-10-17 ENCOUNTER — Ambulatory Visit: Payer: Self-pay | Admitting: Internal Medicine

## 2015-10-17 DIAGNOSIS — D62 Acute posthemorrhagic anemia: Secondary | ICD-10-CM

## 2015-10-17 NOTE — Progress Notes (Signed)
   Subjective:    Patient ID: Jean Morris, female    DOB: 1975-08-12, 40 y.o.   MRN: [id number]  HPI   Pt presents for f/u pulmonary embolism in Jan. Has completed 6 months of therapy. X-ray in ED due to leg pain. Xray was clear. Pulmonary embolism was thought to be caused by birth control pill that she no longer takes BP is normal.   Patient Active Problem List   Diagnosis Date Noted  . Morbid obesity with BMI of 60.0-69.9, adult ([place]) 04/18/2015  . Abnormal uterine bleeding 04/18/2015  . Anemia 04/18/2015  . Pulmonary embolism ([place]) 03/11/2015     Medication List       Accurate as of 10/17/15  9:15 AM. Always use your most recent med list.          acetaminophen 650 MG CR tablet Commonly known as:  TYLENOL 8 HOUR Take 1 tablet (650 mg total) by mouth every 8 (eight) hours as needed for pain.   apixaban 5 MG Tabs tablet Commonly known as:  ELIQUIS Take 1 tablet (5 mg total) by mouth 2 (two) times daily. Starting 03/19/2015 after loading dose of this medication.   butalbital-acetaminophen-caffeine 50-325-40 MG tablet Commonly known as:  FIORICET Take 1-2 tablets by mouth every 6 (six) hours as needed for headache.   cyclobenzaprine 10 MG tablet Commonly known as:  FLEXERIL Take 1 tablet (10 mg total) by mouth 3 (three) times daily as needed for muscle spasms.   ferrous sulfate 325 (65 FE) MG tablet Take 1 tablet (325 mg total) by mouth 2 (two) times daily with a meal.   levonorgestrel 20 MCG/24HR IUD Commonly known as:  MIRENA 1 Intra Uterine Device (1 each total) by Intrauterine route once.        Review of Systems     Objective:   Physical Exam  Constitutional: She is oriented to person, place, and time.  Cardiovascular: Normal rate, regular rhythm and normal heart sounds.   Pulmonary/Chest: Effort normal and breath sounds normal.  Neurological: She is alert and oriented to person, place, and time.    There were no vitals taken for this visit.       Assessment & Plan:  Pt no longer needs to take anti-collagnant F/u in 6 months with lab: CBC

## 2016-03-28 ENCOUNTER — Encounter: Payer: Self-pay | Admitting: Emergency Medicine

## 2016-03-28 ENCOUNTER — Emergency Department
Admission: EM | Admit: 2016-03-28 | Discharge: 2016-03-28 | Disposition: A | Discharge status: Home / Self Care | Payer: Self-pay | Attending: Emergency Medicine | Admitting: Emergency Medicine

## 2016-03-28 DIAGNOSIS — N390 Urinary tract infection, site not specified: Secondary | ICD-10-CM | POA: Insufficient documentation

## 2016-03-28 DIAGNOSIS — Z79899 Other long term (current) drug therapy: Secondary | ICD-10-CM | POA: Insufficient documentation

## 2016-03-28 DIAGNOSIS — I10 Essential (primary) hypertension: Secondary | ICD-10-CM | POA: Insufficient documentation

## 2016-03-28 LAB — URINALYSIS, COMPLETE (UACMP) WITH MICROSCOPIC
Bilirubin Urine: NEGATIVE
Glucose, UA: NEGATIVE mg/dL
Hgb urine dipstick: NEGATIVE
Ketones, ur: NEGATIVE mg/dL
Nitrite: NEGATIVE
Protein, ur: NEGATIVE mg/dL
Specific Gravity, Urine: 1.014 (ref 1.005–1.030)
pH: 7 (ref 5.0–8.0)

## 2016-03-28 LAB — PREGNANCY, URINE: Preg Test, Ur: NEGATIVE

## 2016-03-28 MED ORDER — PHENAZOPYRIDINE HCL 100 MG PO TABS: 100.0000 mg | ORAL_TABLET | Freq: Three times a day (TID) | ORAL | 0 refills | Status: DC | PRN

## 2016-03-28 MED ORDER — CEPHALEXIN 500 MG PO CAPS: 500.0000 mg | ORAL_CAPSULE | Freq: Three times a day (TID) | ORAL | 0 refills | Status: DC

## 2016-03-28 MED ORDER — IBUPROFEN 600 MG PO TABS: 600.0000 mg | ORAL_TABLET | Freq: Four times a day (QID) | ORAL | 0 refills | Status: DC | PRN

## 2016-03-28 NOTE — ED Notes (Signed)
Also c/o headache that; pain is to frontal and maxillary sinus. Denies congestion or runny nose. Also has left ear pain.

## 2016-03-28 NOTE — ED Notes (Signed)
Pt given cup for a urine specimen.

## 2016-03-28 NOTE — ED Triage Notes (Signed)
Pt complains of headache that started this morning and goes into left ear. Pt also complains of burning with urination for 3 days.

## 2016-03-28 NOTE — ED Provider Notes (Signed)
[facility] Emergency Department Provider Note  ____________________________________________  Time seen: Approximately 12:16 PM  I have reviewed the triage vital signs and the nursing notes.   HISTORY  Chief Complaint Headache and Dysuria    HPI Jean Morris is a 41 y.o. female , NAD, presents to the emergency department for evaluation of 4 day history of dysuria, increased urinary frequency and headache. Patient states she has had onset of dysuria and increased urinary frequency approximately 4 days ago. Notes headache began approximately 3 days ago and is located about the front of her head. States this is not the worst headache of her life. Did not have a thunderclap onset. She has had mild photophobia with some left ear pain but denies nasal congestion, runny nose, sinus pressure, ear drainage. Denies any visual changes, lightheadedness or dizziness. Has not had any abdominal pain, nausea or vomiting or diarrhea. Denies chest pain, shortness of breath. Has had no fevers, chills or body aches. Denies any vaginal discharge, pelvic pain, vaginal bleeding.   Past Medical History:  Diagnosis Date  . Anemia   . Hypertension   . Menorrhagia   . Pulmonary embolism [facility])     Patient Active Problem List   Diagnosis Date Noted  . Morbid obesity with BMI of 60.0-69.9, adult ([place]) 04/18/2015  . Abnormal uterine bleeding 04/18/2015  . Anemia 04/18/2015  . Pulmonary embolism ([place]) 03/11/2015    Past Surgical History:  Procedure Laterality Date  . [place]    Prior to Admission medications   Medication Sig Start Date End Date Taking? Authorizing Provider  acetaminophen (TYLENOL 8 HOUR) 650 MG CR tablet Take 1 tablet (650 mg total) by mouth every 8 (eight) hours as needed for pain. 04/18/15   Rubie Maid, MD  butalbital-acetaminophen-caffeine (FIORICET) [phone] MG tablet Take 1-2 tablets by mouth every 6 (six) hours as needed for headache. 07/30/15  07/29/16  Harvest Dark, MD  cephALEXin (KEFLEX) 500 MG capsule Take 1 capsule (500 mg total) by mouth 3 (three) times daily. 03/28/16   Jaziah Kwasnik L Isidro Monks, PA-C  cyclobenzaprine (FLEXERIL) 10 MG tablet Take 1 tablet (10 mg total) by mouth 3 (three) times daily as needed for muscle spasms. 08/29/15   Gregor Hams, MD  ferrous sulfate 325 (65 FE) MG tablet Take 1 tablet (325 mg total) by mouth 2 (two) times daily with a meal. 07/11/15 07/10/16  Tawni Millers, MD  ibuprofen (ADVIL,MOTRIN) 600 MG tablet Take 1 tablet (600 mg total) by mouth every 6 (six) hours as needed. 03/28/16   Tiajah Oyster L Ofilia Rayon, PA-C  levonorgestrel (MIRENA) 20 MCG/24HR IUD 1 Intra Uterine Device (1 each total) by Intrauterine route once. 04/18/15   Rubie Maid, MD  phenazopyridine (PYRIDIUM) 100 MG tablet Take 1 tablet (100 mg total) by mouth 3 (three) times daily as needed for pain (May take 1-2 as needed three times daily). 03/28/16   Deardra Hinkley L Corbin Hott, PA-C    Allergies Patient has no known allergies.  Family History  Problem Relation Age of Onset  . Hypertension Mother   . Stroke Mother   . Heart attack Mother   . Hypertension Father   . Hypertension Other     Social History Social History  Substance Use Topics  . Smoking status: Never Smoker  . Smokeless tobacco: Never Used  . Alcohol use No     Review of Systems  Constitutional: No fever/chills or fatigue Eyes: Positive photophobia. No visual changes or floaters in vision.  ENT: Positive ear pain. No congestion, runny nose, sinus pressure. Cardiovascular: No chest pain. Respiratory: No cough. No shortness of breath. No wheezing.  Gastrointestinal: No abdominal pain.  No nausea, vomiting.  No diarrhea.  No constipation. Genitourinary: Positive for dysuria and increased urinary frequency. No hematuria, vaginal discharge, vaginal bleeding, pelvic pain. No urinary hesitancy, urgency. Musculoskeletal: Negative for back pain, general myalgias.  Skin: Negative for  rash. Neurological: Positive for headaches, focal weakness or numbness. No lightheadedness, dizziness. No tingling. 10-point ROS otherwise negative.  ____________________________________________   PHYSICAL EXAM:  VITAL SIGNS: ED Triage Vitals  Enc Vitals Group     BP 03/28/16 1153 (!) 160/88     Pulse Rate 03/28/16 1153 97     Resp 03/28/16 1153 18     Temp 03/28/16 1153 98.3 F (36.8 C)     Temp Source 03/28/16 1153 Oral     SpO2 03/28/16 1153 100 %     Weight 03/28/16 1154 (!) 384 lb (174.2 kg)     Height 03/28/16 1154 5' 5"  (1.651 m)     Head Circumference --      Peak Flow --      Pain Score 03/28/16 1154 10     Pain Loc --      Pain Edu? --      Excl. in [place]? --      Constitutional: Alert and oriented. Well appearing and in no acute distress. Eyes: Conjunctivae are normal. PERRLA. EOMI without pain.  Head: Atraumatic.  ENT:      Ears: TMs visualized bilaterally without erythema, effusion, bulging or perforation.      Nose: No congestion/rhinnorhea.      Mouth/Throat: Mucous membranes are moist.  Erythema, swelling. Uvula is midline. Airway is patent. Neck: Supple with full range of motion. Hematological/Lymphatic/Immunilogical: No cervical lymphadenopathy. Cardiovascular: Normal rate, regular rhythm. Normal S1 and S2.  Good peripheral circulation. Respiratory: Normal respiratory effort without tachypnea or retractions. Lungs CTAB with breath sounds noted in all lung fields. No wheeze, rhonchi, rales. Gastrointestinal: Abdomen is obese but soft and nontender without distention or guarding in all quadrants. No rebound or rigidity. No masses. Bowel sounds present and normoactive in all quadrants. No CVA tenderness. Musculoskeletal: No lower extremity tenderness nor edema.  No joint effusions. Neurologic:  Normal speech and language. No gross focal neurologic deficits are appreciated.  Skin:  Skin is warm, dry and intact. No rash noted. Psychiatric: Mood and affect are  normal. Speech and behavior are normal. Patient exhibits appropriate insight and judgement.   ____________________________________________   LABS (all labs ordered are listed, but only abnormal results are displayed)  Labs Reviewed  URINALYSIS, COMPLETE (UACMP) WITH MICROSCOPIC - Abnormal; Notable for the following:       Result Value   Color, Urine YELLOW (*)    APPearance CLEAR (*)    Leukocytes, UA SMALL (*)    Bacteria, UA RARE (*)    Squamous Epithelial / LPF 0-5 (*)    All other components within normal limits  PREGNANCY, URINE   ____________________________________________  EKG  None ____________________________________________  RADIOLOGY  None ____________________________________________    PROCEDURES  Procedure(s) performed: None   Procedures   Medications - No data to display   ____________________________________________   INITIAL IMPRESSION / ASSESSMENT AND PLAN / ED COURSE  Pertinent labs & imaging results that were available during my care of the patient were reviewed by me and considered in my medical decision making (see chart for details).  Patient's diagnosis is consistent with lower urinary tract infection. Patient will be discharged home with prescriptions for Keflex, ibuprofen and Pyridium to take as directed. Patient is to follow up with her primary care provider if symptoms persist past this treatment course. Patient is given ED precautions to return to the ED for any worsening or new symptoms.   ____________________________________________  FINAL CLINICAL IMPRESSION(S) / ED DIAGNOSES  Final diagnoses:  Lower urinary tract infectious disease      NEW MEDICATIONS STARTED DURING THIS VISIT:  Discharge Medication List as of 03/28/2016  1:17 PM    START taking these medications   Details  cephALEXin (KEFLEX) 500 MG capsule Take 1 capsule (500 mg total) by mouth 3 (three) times daily., Starting Fri 03/28/2016, Print     ibuprofen (ADVIL,MOTRIN) 600 MG tablet Take 1 tablet (600 mg total) by mouth every 6 (six) hours as needed., Starting Fri 03/28/2016, Print    phenazopyridine (PYRIDIUM) 100 MG tablet Take 1 tablet (100 mg total) by mouth 3 (three) times daily as needed for pain (May take 1-2 as needed three times daily)., Starting Fri 03/28/2016, Print             Judithe Modest [place], PA-C 03/28/16 1508    Schuyler Amor, MD 03/28/16 1536

## 2016-04-23 ENCOUNTER — Other Ambulatory Visit: Payer: Self-pay

## 2016-04-24 LAB — CBC WITH DIFFERENTIAL/PLATELET
Basophils Absolute: 0 10*3/uL (ref 0.0–0.2)
Basos: 0 %
EOS (ABSOLUTE): 0.2 10*3/uL (ref 0.0–0.4)
Eos: 3 %
Hematocrit: 32.9 % — ABNORMAL LOW (ref 34.0–46.6)
Hemoglobin: 10.6 g/dL — ABNORMAL LOW (ref 11.1–15.9)
Immature Grans (Abs): 0 10*3/uL (ref 0.0–0.1)
Immature Granulocytes: 0 %
Lymphocytes Absolute: 2.7 10*3/uL (ref 0.7–3.1)
Lymphs: 38 %
MCH: 21.7 pg — ABNORMAL LOW (ref 26.6–33.0)
MCHC: 32.2 g/dL (ref 31.5–35.7)
MCV: 67 fL — ABNORMAL LOW (ref 79–97)
Monocytes Absolute: 0.5 10*3/uL (ref 0.1–0.9)
Monocytes: 7 %
Neutrophils Absolute: 3.6 10*3/uL (ref 1.4–7.0)
Neutrophils: 52 %
Platelets: 423 10*3/uL — ABNORMAL HIGH (ref 150–379)
RBC: 4.89 x10E6/uL (ref 3.77–5.28)
RDW: 18.6 % — ABNORMAL HIGH (ref 12.3–15.4)
WBC: 7 10*3/uL (ref 3.4–10.8)

## 2016-04-24 LAB — IRON: Iron: 24 ug/dL — ABNORMAL LOW (ref 27–159)

## 2016-04-30 ENCOUNTER — Ambulatory Visit: Payer: Self-pay | Admitting: Internal Medicine

## 2016-05-28 ENCOUNTER — Ambulatory Visit: Payer: Self-pay | Admitting: Internal Medicine

## 2016-05-28 VITALS — BP 145/92 | HR 79 | Temp 98.1°F | Wt 379.5 lb

## 2016-05-28 DIAGNOSIS — D509 Iron deficiency anemia, unspecified: Secondary | ICD-10-CM

## 2016-05-28 DIAGNOSIS — M25512 Pain in left shoulder: Secondary | ICD-10-CM

## 2016-05-28 MED ORDER — FERROUS SULFATE 325 (65 FE) MG PO TABS: 325.0000 mg | ORAL_TABLET | Freq: Two times a day (BID) | ORAL | 7 refills | Status: DC
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
## 2016-05-28 NOTE — Patient Instructions (Signed)
F/u in 6 mo w/ labs Referral for L shoulder xray for pain Referral to orthopedic for evaluation of L shoulder

## 2016-08-26 ENCOUNTER — Emergency Department
Admission: EM | Admit: 2016-08-26 | Discharge: 2016-08-26 | Disposition: A | Discharge status: Home / Self Care | Payer: No Typology Code available for payment source | Attending: Emergency Medicine | Admitting: Emergency Medicine

## 2016-08-26 ENCOUNTER — Encounter: Payer: Self-pay | Admitting: Emergency Medicine

## 2016-08-26 ENCOUNTER — Emergency Department: Payer: No Typology Code available for payment source

## 2016-08-26 DIAGNOSIS — Y9389 Activity, other specified: Secondary | ICD-10-CM | POA: Diagnosis not present

## 2016-08-26 DIAGNOSIS — Y929 Unspecified place or not applicable: Secondary | ICD-10-CM | POA: Diagnosis not present

## 2016-08-26 DIAGNOSIS — M542 Cervicalgia: Secondary | ICD-10-CM | POA: Insufficient documentation

## 2016-08-26 DIAGNOSIS — Z79899 Other long term (current) drug therapy: Secondary | ICD-10-CM | POA: Insufficient documentation

## 2016-08-26 DIAGNOSIS — M25512 Pain in left shoulder: Secondary | ICD-10-CM | POA: Diagnosis present

## 2016-08-26 DIAGNOSIS — I10 Essential (primary) hypertension: Secondary | ICD-10-CM | POA: Diagnosis not present

## 2016-08-26 DIAGNOSIS — Y998 Other external cause status: Secondary | ICD-10-CM | POA: Diagnosis not present

## 2016-08-26 MED ORDER — CYCLOBENZAPRINE HCL 10 MG PO TABS
10.0000 mg | ORAL_TABLET | Freq: Three times a day (TID) | ORAL | 0 refills | Status: AC | PRN
Start: 2016-08-26 — End: 2016-08-29

## 2016-08-26 MED ORDER — ORPHENADRINE CITRATE 30 MG/ML IJ SOLN
60.0000 mg | Freq: Two times a day (BID) | INTRAMUSCULAR | Status: DC
  Administered 2016-08-26: 60 mg via INTRAMUSCULAR
  Filled 2016-08-26: qty 2
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
## 2016-10-30 ENCOUNTER — Telehealth: Payer: Self-pay | Admitting: Pharmacy Technician

## 2016-10-30 ENCOUNTER — Emergency Department
Admission: EM | Admit: 2016-10-30 | Discharge: 2016-10-30 | Disposition: A | Discharge status: Home / Self Care | Payer: Self-pay | Attending: Emergency Medicine | Admitting: Emergency Medicine

## 2016-10-30 ENCOUNTER — Encounter: Payer: Self-pay | Admitting: Emergency Medicine

## 2016-10-30 ENCOUNTER — Emergency Department: Payer: Self-pay

## 2016-10-30 DIAGNOSIS — R1032 Left lower quadrant pain: Secondary | ICD-10-CM | POA: Insufficient documentation

## 2016-10-30 DIAGNOSIS — Z86711 Personal history of pulmonary embolism: Secondary | ICD-10-CM | POA: Insufficient documentation

## 2016-10-30 DIAGNOSIS — M79605 Pain in left leg: Secondary | ICD-10-CM | POA: Insufficient documentation

## 2016-10-30 DIAGNOSIS — I10 Essential (primary) hypertension: Secondary | ICD-10-CM | POA: Insufficient documentation

## 2016-10-30 DIAGNOSIS — R935 Abnormal findings on diagnostic imaging of other abdominal regions, including retroperitoneum: Secondary | ICD-10-CM | POA: Insufficient documentation

## 2016-10-30 LAB — COMPREHENSIVE METABOLIC PANEL
ALT: 13 U/L — ABNORMAL LOW (ref 14–54)
AST: 17 U/L (ref 15–41)
Albumin: 3.8 g/dL (ref 3.5–5.0)
Alkaline Phosphatase: 66 U/L (ref 38–126)
Anion gap: 7 (ref 5–15)
BUN: 11 mg/dL (ref 6–20)
CO2: 26 mmol/L (ref 22–32)
Calcium: 9.1 mg/dL (ref 8.9–10.3)
Chloride: 103 mmol/L (ref 101–111)
Creatinine, Ser: 0.76 mg/dL (ref 0.44–1.00)
GFR calc Af Amer: >60 mL/min (ref >60)
GFR calc non Af Amer: >60 mL/min (ref >60)
Glucose, Bld: 99 mg/dL (ref 65–99)
Potassium: 3.7 mmol/L (ref 3.5–5.1)
Sodium: 136 mmol/L (ref 135–145)
Total Bilirubin: 0.9 mg/dL (ref 0.3–1.2)
Total Protein: 9 g/dL — ABNORMAL HIGH (ref 6.5–8.1)

## 2016-10-30 LAB — CBC WITH DIFFERENTIAL/PLATELET
Basophils Absolute: 0 10*3/uL (ref 0–0.1)
Basophils Relative: 1 %
Eosinophils Absolute: 0.2 10*3/uL (ref 0–0.7)
Eosinophils Relative: 2 %
HCT: 32.8 % — ABNORMAL LOW (ref 35.0–47.0)
Hemoglobin: 11.3 g/dL — ABNORMAL LOW (ref 12.0–16.0)
Lymphocytes Relative: 35 %
Lymphs Abs: 2.3 10*3/uL (ref 1.0–3.6)
MCH: 22.2 pg — ABNORMAL LOW (ref 26.0–34.0)
MCHC: 34.5 g/dL (ref 32.0–36.0)
MCV: 64.5 fL — ABNORMAL LOW (ref 80.0–100.0)
Monocytes Absolute: 0.4 10*3/uL (ref 0.2–0.9)
Monocytes Relative: 6 %
Neutro Abs: 3.7 10*3/uL (ref 1.4–6.5)
Neutrophils Relative %: 56 %
Platelets: 445 10*3/uL — ABNORMAL HIGH (ref 150–440)
RBC: 5.08 MIL/uL (ref 3.80–5.20)
RDW: 18.9 % — ABNORMAL HIGH (ref 11.5–14.5)
WBC: 6.6 10*3/uL (ref 3.6–11.0)

## 2016-10-30 LAB — URINALYSIS, COMPLETE (UACMP) WITH MICROSCOPIC
Bacteria, UA: NONE SEEN
Bilirubin Urine: NEGATIVE
Glucose, UA: NEGATIVE mg/dL
Ketones, ur: NEGATIVE mg/dL
Nitrite: NEGATIVE
Protein, ur: NEGATIVE mg/dL
Specific Gravity, Urine: 1.01 (ref 1.005–1.030)
pH: 6 (ref 5.0–8.0)

## 2016-10-30 LAB — POCT PREGNANCY, URINE: Preg Test, Ur: NEGATIVE

## 2016-10-30 MED ORDER — MORPHINE SULFATE (PF) 4 MG/ML IV SOLN
4.0000 mg | Freq: Once | INTRAVENOUS | Status: AC
Start: 2016-10-30 — End: 2016-10-30
  Administered 2016-10-30: 4 mg via INTRAVENOUS

## 2016-10-30 MED ORDER — ONDANSETRON HCL 4 MG/2ML IJ SOLN
INTRAMUSCULAR | Status: AC
  Filled 2016-10-30: qty 2

## 2016-10-30 MED ORDER — IOPAMIDOL (ISOVUE-300) INJECTION 61%
30.0000 mL | Freq: Once | INTRAVENOUS | Status: AC | PRN
  Administered 2016-10-30: 30 mL via ORAL

## 2016-10-30 MED ORDER — MORPHINE SULFATE (PF) 4 MG/ML IV SOLN
INTRAVENOUS | Status: AC
  Filled 2016-10-30: qty 1

## 2016-10-30 MED ORDER — IOPAMIDOL (ISOVUE-300) INJECTION 61%
125.0000 mL | Freq: Once | INTRAVENOUS | Status: AC | PRN
  Administered 2016-10-30: 125 mL via INTRAVENOUS

## 2016-10-30 MED ORDER — OXYCODONE-ACETAMINOPHEN 5-325 MG PO TABS: 1.0000 | ORAL_TABLET | Freq: Three times a day (TID) | ORAL | 0 refills | Status: DC | PRN

## 2016-10-30 MED ORDER — ONDANSETRON 4 MG PO TBDP: 4.0000 mg | ORAL_TABLET | Freq: Three times a day (TID) | ORAL | 0 refills | Status: DC | PRN

## 2016-10-30 MED ORDER — ONDANSETRON HCL 4 MG/2ML IJ SOLN
4.0000 mg | Freq: Once | INTRAMUSCULAR | Status: AC
  Administered 2016-10-30: 4 mg via INTRAVENOUS

## 2016-10-30 MED ORDER — MORPHINE SULFATE (PF) 4 MG/ML IV SOLN
4.0000 mg | Freq: Once | INTRAVENOUS | Status: AC
Start: 2016-10-30 — End: 2016-10-30
  Administered 2016-10-30: 4 mg via INTRAVENOUS
  Filled 2016-10-30: qty 1

## 2016-10-30 NOTE — ED Notes (Signed)
Patient made aware of need of urine sample. States she is unable to void at this time.  

## 2016-10-30 NOTE — Telephone Encounter (Signed)
Patient failed to provide current poi.  No additional medication assistance will be provided by [facility] without the required proof of income documentation.  Patient notified by letter.  [facility]

## 2016-10-30 NOTE — ED Notes (Signed)
Pt awaiting CT.

## 2016-10-30 NOTE — ED Notes (Signed)
Pt states that her daughter is almost here. Pt ambulates to bathroom safely independently. Family at bedside and will wait in lobby with patient until ride is here. Pt alert and oriented X4, active, cooperative, pt in NAD. RR even and unlabored, color WNL.

## 2016-10-30 NOTE — ED Triage Notes (Signed)
Patient presents to the ED with left lower quadrant, sharp cramping pain x 2 days and left hip and leg pain x 3 days.  Patient denies vomiting and diarrhea.  Patient denies injury to her leg.  Patient has history of PE.

## 2016-10-30 NOTE — ED Notes (Signed)
Pt calling out, stating that she is in pain 9/10 now. Emesis x 1 after CT scan. Pt c/o headache as well. Lights dimmed. EDP notified. Orders received.

## 2016-10-30 NOTE — ED Notes (Signed)
Patient transported to CT 

## 2016-10-30 NOTE — ED Notes (Signed)
Pt states that her daughters are coming to pick her up and should be back at anytime. Pt appears to be sleeping with this RN enters room, aroused by verbal stimuli. Pt appears drowsy. EDP aware that pt continues to rest in her tx room.

## 2016-10-30 NOTE — ED Provider Notes (Signed)
[facility] Emergency Department Provider Note       Time seen: ----------------------------------------- 9:14 AM on 10/30/2016 -----------------------------------------     I have reviewed the triage vital signs and the nursing notes.   HISTORY   Chief Complaint Abdominal Pain and Leg Pain    HPI Jean Morris is a 41 y.o. female who presents to the ED for left lower quadrant, sharp cramping pain for the last 2 days with left hip and leg pain for last 3 days. patient denies any recent falls or trauma, denies any heavy physical activity. She denies fevers, chills, chest pain, shortness of breath or other complaints. She is unsure if the 2 pains are related. Pain is currently 9 out of 10.   Past Medical History:  Diagnosis Date  . Anemia   . Hypertension   . Menorrhagia   . Pulmonary embolism [facility])     Patient Active Problem List   Diagnosis Date Noted  . Morbid obesity with BMI of 60.0-69.9, adult ([place]) 04/18/2015  . Abnormal uterine bleeding 04/18/2015  . Anemia 04/18/2015  . Pulmonary embolism ([place]) 03/11/2015    Past Surgical History:  Procedure Laterality Date  . CESAREAN SECTION  1994    Allergies Pollen extract  Social History Social History  Substance Use Topics  . Smoking status: Never Smoker  . Smokeless tobacco: Never Used  . Alcohol use No    Review of Systems Constitutional: Negative for fever. Eyes: Negative for vision changes ENT:  Negative for congestion, sore throat Cardiovascular: Negative for chest pain. Respiratory: Negative for shortness of breath. Gastrointestinal: positive for abdominal pain Genitourinary: Negative for dysuria. Musculoskeletal:positive for left leg pain Skin: Negative for rash. Neurological: Negative for headaches, focal weakness or numbness.  All systems negative/normal/unremarkable except as stated in the HPI  ____________________________________________   PHYSICAL  EXAM:  VITAL SIGNS: ED Triage Vitals [10/30/16 0907]  Enc Vitals Group     BP 129/60     Pulse Rate 83     Resp 18     Temp 97.7 F (36.5 C)     Temp Source Oral     SpO2 99 %     Weight (!) 384 lb (174.2 kg)     Height 5' 5"  (1.651 m)     Head Circumference      Peak Flow      Pain Score 10     Pain Loc      Pain Edu?      Excl. in [place]?     Constitutional: Alert and oriented.morbidly obese Eyes: Conjunctivae are normal. Normal extraocular movements. ENT   Head: Normocephalic and atraumatic.   Nose: No congestion/rhinnorhea.   Mouth/Throat: Mucous membranes are moist.   Neck: No stridor. Cardiovascular: Normal rate, regular rhythm. No murmurs, rubs, or gallops. Respiratory: Normal respiratory effort without tachypnea nor retractions. Breath sounds are clear and equal bilaterally. No wheezes/rales/rhonchi. Gastrointestinal: Soft and nontender. Normal bowel sounds Musculoskeletal: Nontender with normal range of motion in extremities. No lower extremity tenderness nor edema. Neurologic:  Normal speech and language. No gross focal neurologic deficits are appreciated.  Skin:  Skin is warm, dry and intact. No rash noted.  Psychiatric: Mood and affect are normal. Speech and behavior are normal.  ___________________________________________  ED COURSE:  Pertinent labs & imaging results that were available during my care of the patient were reviewed by me and considered in my medical decision making (see chart for details). Patient presents for abdominal pain and left  leg pain, we will assess with labs and imaging as indicated.   Procedures ____________________________________________   LABS (pertinent positives/negatives)  Labs Reviewed  CBC WITH DIFFERENTIAL/PLATELET - Abnormal; Notable for the following:       Result Value   Hemoglobin 11.3 (*)    HCT 32.8 (*)    MCV 64.5 (*)    MCH 22.2 (*)    RDW 18.9 (*)    Platelets 445 (*)    All other components  within normal limits  COMPREHENSIVE METABOLIC PANEL - Abnormal; Notable for the following:    Total Protein 9.0 (*)    ALT 13 (*)    All other components within normal limits  URINALYSIS, COMPLETE (UACMP) WITH MICROSCOPIC - Abnormal; Notable for the following:    Color, Urine YELLOW (*)    APPearance HAZY (*)    Hgb urine dipstick LARGE (*)    Leukocytes, UA TRACE (*)    Squamous Epithelial / LPF 6-30 (*)    All other components within normal limits  POC URINE PREG, ED  POCT PREGNANCY, URINE    RADIOLOGY Images were viewed by me  CT of abdomen and pelvis IMPRESSION: New small patchy parenchymal lung densities in both lungs. Findings are suggestive for an infectious or inflammatory process. No pleural effusions.  Colonic diverticulosis without acute colonic inflammation.  Hepatic steatosis.  ____________________________________________  FINAL ASSESSMENT AND PLAN  abdominal pain, left leg pain  Plan: Patient's labs and imaging were dictated above. Patient had presented for left lower quadrant pain as well as left leg pain. These are uncertain in etiology and no focal abnormalities were identified either clinically or by CT and labs. She had one episode of vomiting while in the ER was given Zofran. She'll be discharged with pain medicine and antiemetics. She is stable for outpatient follow-up.   Earleen Newport, MD   Note: This note was generated in part or whole with voice recognition software. Voice recognition is usually quite accurate but there are transcription errors that can and very often do occur. I apologize for any typographical errors that were not detected and corrected.     Earleen Newport, MD 10/30/16 [phone]

## 2016-10-30 NOTE — ED Notes (Signed)
ED Provider at bedside. 

## 2016-11-05 ENCOUNTER — Ambulatory Visit: Payer: Self-pay | Admitting: Internal Medicine

## 2016-11-05 VITALS — BP 127/85 | HR 76 | Wt 375.5 lb

## 2016-11-05 DIAGNOSIS — D649 Anemia, unspecified: Secondary | ICD-10-CM

## 2016-11-05 NOTE — Progress Notes (Signed)
   Subjective:    Patient ID: Jean Morris, female    DOB: 1975/04/30, 41 y.o.   MRN: [id number]  HPI   Pt was seen in the ER on 10/30/16 for pain in left leg (from hip to the toes) and some pain in left side of stomach. Pt reports that pain has subsided and she is feeling better.   Pt reports that she continues to take iron (3 times a day) for her anemia.     Patient Active Problem List   Diagnosis Date Noted  . Morbid obesity with BMI of 60.0-69.9, adult ([place]) 04/18/2015  . Abnormal uterine bleeding 04/18/2015  . Anemia 04/18/2015  . Pulmonary embolism ([place]) 03/11/2015   Allergies as of 11/05/2016      Reactions   Pollen Extract Nausea And Vomiting   Itchy, swelling, watery eyes, runny nose.      Medication List       Accurate as of 11/05/16  9:05 AM. Always use your most recent med list.          acetaminophen 650 MG CR tablet Commonly known as:  TYLENOL 8 HOUR Take 1 tablet (650 mg total) by mouth every 8 (eight) hours as needed for pain.   cephALEXin 500 MG capsule Commonly known as:  KEFLEX Take 1 capsule (500 mg total) by mouth 3 (three) times daily.   ferrous sulfate 325 (65 FE) MG tablet Take 1 tablet (325 mg total) by mouth 2 (two) times daily with a meal.   levonorgestrel 20 MCG/24HR IUD Commonly known as:  MIRENA 1 Intra Uterine Device (1 each total) by Intrauterine route once.   ondansetron 4 MG disintegrating tablet Commonly known as:  ZOFRAN ODT Take 1 tablet (4 mg total) by mouth every 8 (eight) hours as needed for nausea or vomiting.   oxyCODONE-acetaminophen 5-325 MG tablet Commonly known as:  PERCOCET Take 1-2 tablets by mouth every 8 (eight) hours as needed.         Review of Systems     Objective:   Physical Exam  Constitutional: She is oriented to person, place, and time.  Cardiovascular: Normal rate, regular rhythm and normal heart sounds.   Pulmonary/Chest: Effort normal and breath sounds normal.  Neurological: She is alert and  oriented to person, place, and time.   BP 127/85   Pulse 76   Wt (!) 375 lb 8 oz (170.3 kg)   LMP 10/30/2016   BMI 62.49 kg/m       Assessment & Plan:   If pain flares up again, schedule a follow-up appointment. Cause of pain is unknown, but it has subsided. Follow-up in 6 months with labs a week prior.

## 2016-11-06 ENCOUNTER — Ambulatory Visit: Payer: Self-pay

## 2016-11-19 ENCOUNTER — Encounter: Payer: Self-pay | Admitting: Emergency Medicine

## 2016-11-19 ENCOUNTER — Emergency Department
Admission: EM | Admit: 2016-11-19 | Discharge: 2016-11-19 | Disposition: A | Discharge status: Home / Self Care | Payer: Self-pay | Attending: Emergency Medicine | Admitting: Emergency Medicine

## 2016-11-19 ENCOUNTER — Emergency Department: Payer: Self-pay

## 2016-11-19 ENCOUNTER — Other Ambulatory Visit: Payer: Self-pay

## 2016-11-19 ENCOUNTER — Ambulatory Visit: Payer: Self-pay | Admitting: Internal Medicine

## 2016-11-19 VITALS — BP 123/76 | HR 79 | Temp 98.0°F | Wt 378.1 lb

## 2016-11-19 DIAGNOSIS — R531 Weakness: Secondary | ICD-10-CM | POA: Insufficient documentation

## 2016-11-19 DIAGNOSIS — D649 Anemia, unspecified: Secondary | ICD-10-CM

## 2016-11-19 DIAGNOSIS — D509 Iron deficiency anemia, unspecified: Secondary | ICD-10-CM

## 2016-11-19 DIAGNOSIS — K625 Hemorrhage of anus and rectum: Secondary | ICD-10-CM

## 2016-11-19 DIAGNOSIS — R1032 Left lower quadrant pain: Secondary | ICD-10-CM | POA: Insufficient documentation

## 2016-11-19 DIAGNOSIS — Z79899 Other long term (current) drug therapy: Secondary | ICD-10-CM | POA: Insufficient documentation

## 2016-11-19 LAB — CBC
HCT: 33.2 % — ABNORMAL LOW (ref 35.0–47.0)
Hemoglobin: 11.3 g/dL — ABNORMAL LOW (ref 12.0–16.0)
MCH: 22.1 pg — ABNORMAL LOW (ref 26.0–34.0)
MCHC: 34.1 g/dL (ref 32.0–36.0)
MCV: 64.8 fL — ABNORMAL LOW (ref 80.0–100.0)
Platelets: 429 10*3/uL (ref 150–440)
RBC: 5.12 MIL/uL (ref 3.80–5.20)
RDW: 19.1 % — ABNORMAL HIGH (ref 11.5–14.5)
WBC: 6.5 10*3/uL (ref 3.6–11.0)

## 2016-11-19 LAB — URINALYSIS, COMPLETE (UACMP) WITH MICROSCOPIC
Bilirubin Urine: NEGATIVE
Glucose, UA: NEGATIVE mg/dL
Ketones, ur: NEGATIVE mg/dL
Leukocytes, UA: NEGATIVE
Nitrite: NEGATIVE
Protein, ur: 30 mg/dL — AB
Specific Gravity, Urine: 1.02 (ref 1.005–1.030)
pH: 7 (ref 5.0–8.0)

## 2016-11-19 LAB — COMPREHENSIVE METABOLIC PANEL
ALT: 12 U/L — ABNORMAL LOW (ref 14–54)
AST: 16 U/L (ref 15–41)
Albumin: 3.5 g/dL (ref 3.5–5.0)
Alkaline Phosphatase: 68 U/L (ref 38–126)
Anion gap: 8 (ref 5–15)
BUN: 6 mg/dL (ref 6–20)
CO2: 27 mmol/L (ref 22–32)
Calcium: 9.2 mg/dL (ref 8.9–10.3)
Chloride: 101 mmol/L (ref 101–111)
Creatinine, Ser: 0.74 mg/dL (ref 0.44–1.00)
GFR calc Af Amer: >60 mL/min (ref >60)
GFR calc non Af Amer: >60 mL/min (ref >60)
Glucose, Bld: 114 mg/dL — ABNORMAL HIGH (ref 65–99)
Potassium: 4.1 mmol/L (ref 3.5–5.1)
Sodium: 136 mmol/L (ref 135–145)
Total Bilirubin: 0.5 mg/dL (ref 0.3–1.2)
Total Protein: 8.6 g/dL — ABNORMAL HIGH (ref 6.5–8.1)

## 2016-11-19 LAB — POCT PREGNANCY, URINE: Preg Test, Ur: NEGATIVE

## 2016-11-19 LAB — LIPASE, BLOOD: Lipase: 25 U/L (ref 11–51)

## 2016-11-19 MED ORDER — IOPAMIDOL (ISOVUE-300) INJECTION 61%
125.0000 mL | Freq: Once | INTRAVENOUS | Status: AC | PRN
Start: 2016-11-19 — End: 2016-11-19
  Administered 2016-11-19: 125 mL via INTRAVENOUS
  Filled 2016-11-19: qty 150

## 2016-11-19 MED ORDER — SODIUM CHLORIDE 0.9 % IV BOLUS (SEPSIS)
1000.0000 mL | Freq: Once | INTRAVENOUS | Status: AC
  Administered 2016-11-19: 1000 mL via INTRAVENOUS

## 2016-11-19 MED ORDER — IOPAMIDOL (ISOVUE-300) INJECTION 61%
30.0000 mL | Freq: Once | INTRAVENOUS | Status: AC | PRN
  Administered 2016-11-19: 30 mL via ORAL
  Filled 2016-11-19: qty 30

## 2016-11-19 NOTE — ED Provider Notes (Signed)
[facility] Emergency Department Provider Note  Time seen: 2:38 PM  I have reviewed the triage vital signs and the nursing notes.   HISTORY  Chief Complaint Abdominal Pain; Weakness; Rectal Bleeding; and Anemia    HPI Jean Morris is a 41 y.o. female With a past medical history of anemia, menorrhagia, obesity, presents the emergency department for rectal bleeding and left lower quadrant abdominal pain and fatigue. According to the patient for the past 3 or 4 days she has been feeling extremely tired and fatigued. She states for the past 2 days she has noticed blood in her stool when she wipes. She saw her doctor this morning who is concerned that her blood levels may be low so she sent her to the emergency department. Here the patient appears overall very well, no distress. She is currently on her period. Denies any dysuria. Denies nausea or vomiting. Denies diarrhea. Does state moderate dull pain in her left lower quadrant. History of diverticulitis to which this feels somewhat similar.  Past Medical History:  Diagnosis Date  . Anemia   . Menorrhagia   . Pulmonary embolism [facility])     Patient Active Problem List   Diagnosis Date Noted  . Morbid obesity with BMI of 60.0-69.9, adult ([place]) 04/18/2015  . Abnormal uterine bleeding 04/18/2015  . Anemia 04/18/2015  . Pulmonary embolism ([place]) 03/11/2015    Past Surgical History:  Procedure Laterality Date  . [place]    Prior to Admission medications   Medication Sig Start Date End Date Taking? Authorizing Provider  acetaminophen (TYLENOL 8 HOUR) 650 MG CR tablet Take 1 tablet (650 mg total) by mouth every 8 (eight) hours as needed for pain. 04/18/15   Rubie Maid, MD  cephALEXin (KEFLEX) 500 MG capsule Take 1 capsule (500 mg total) by mouth 3 (three) times daily. Patient not taking: Reported on 10/30/2016 03/28/16   Hagler, Jami L, PA-C  ferrous sulfate 325 (65 FE) MG tablet Take 1 tablet (325 mg  total) by mouth 2 (two) times daily with a meal. Patient taking differently: Take 325 mg by mouth 3 (three) times daily with meals.  05/28/16 05/28/17  Tawni Millers, MD  levonorgestrel (MIRENA) 20 MCG/24HR IUD 1 Intra Uterine Device (1 each total) by Intrauterine route once. 04/18/15   Rubie Maid, MD  ondansetron (ZOFRAN ODT) 4 MG disintegrating tablet Take 1 tablet (4 mg total) by mouth every 8 (eight) hours as needed for nausea or vomiting. 10/30/16   Earleen Newport, MD  oxyCODONE-acetaminophen (PERCOCET) 5-325 MG tablet Take 1-2 tablets by mouth every 8 (eight) hours as needed. Patient not taking: Reported on 11/05/2016 10/30/16   Earleen Newport, MD    Allergies  Allergen Reactions  . Pollen Extract Nausea And Vomiting    Itchy, swelling, watery eyes, runny nose.    Family History  Problem Relation Age of Onset  . Hypertension Mother   . Stroke Mother   . Heart attack Mother   . Hypertension Father   . Hypertension Other     Social History Social History  Substance Use Topics  . Smoking status: Never Smoker  . Smokeless tobacco: Never Used  . Alcohol use No    Review of Systems Constitutional: Negative for fever. Cardiovascular: Negative for chest pain. Respiratory: Negative for shortness of breath. Gastrointestinal: left lower quadrant abdominal pain. Negative nausea and vomiting or diarrhea. Positive for blood in her stool. Genitourinary: Negative for dysuria.currently on her menstrual period.  Neurological: Negative for headache All other ROS negative  ____________________________________________   PHYSICAL EXAM:  VITAL SIGNS: ED Triage Vitals  Enc Vitals Group     BP 11/19/16 1158 (!) 134/93     Pulse Rate 11/19/16 1158 93     Resp 11/19/16 1158 16     Temp 11/19/16 1158 98.5 F (36.9 C)     Temp Source 11/19/16 1158 Oral     SpO2 11/19/16 1158 97 %     Weight 11/19/16 1159 (!) 378 lb (171.5 kg)     Height 11/19/16 1159 5' 5"  (1.651 m)     Head  Circumference --      Peak Flow --      Pain Score 11/19/16 1158 8     Pain Loc --      Pain Edu? --      Excl. in [place]? --     Constitutional: Alert and oriented. Well appearing and in no distress. Eyes: Normal exam ENT   Head: Normocephalic and atraumatic.   Mouth/Throat: Mucous membranes are moist. Cardiovascular: Normal rate, regular rhythm. No murmur Respiratory: Normal respiratory effort without tachypnea nor retractions. Breath sounds are clear  Gastrointestinal: soft, mild left upper quadrant with moderate left lower quadrant tenderness to palpation. No rebound or guarding. No distention. Musculoskeletal: Nontender with normal range of motion in all extremities.  Neurologic:  Normal speech and language. No gross focal neurologic deficits  Skin:  Skin is warm, dry and intact.  Psychiatric: Mood and affect are normal.  ____________________________________________   RADIOLOGY  IMPRESSION: 1. No CT evidence for acute intra-abdominal or pelvic abnormality. 2. Re- demonstrated faint centrilobular densities in the bilateral lung bases suggestive of mild respiratory infection 3. Minimal colon diverticular change without acute inflammation  ____________________________________________   INITIAL IMPRESSION / ASSESSMENT AND PLAN / ED COURSE  Pertinent labs & imaging results that were available during my care of the patient were reviewed by me and considered in my medical decision making (see chart for details).  patient presents to the emergency department for generalized fatigue, weakness along with rectal bleeding for the past 2 days. Differential this time include GI bleed, symptomatic anemia, diverticulitis or colitis. We will check labs and proceed with a CT abdomen/pelvis to further evaluate.  Patient's labs are largely at her baseline. Hemoglobin is 11.3. Patient's urinalysis is equivocal currently on her menstrual cycle we will send a urine culture but the patient  denies any dysuria we will hold off on treatment at this time. CT pending.  rectal exam shows light brown stool, guaiac-negative. No obvious hemorrhoids.  CT is read is largely negative. Denies any respiratory symptoms. Patient's workup is largely normal in the emergency department. Urine culture has been sent as a precaution but denies any dysuria. I discussed with the patient taking plenty of rest, drinking plenty of fluids and following up with her doctor, she is agreeable to this plan.  ____________________________________________   FINAL CLINICAL IMPRESSION(S) / ED DIAGNOSES  generalized fatigue/weakness Abdominal pain    Harvest Dark, MD 11/19/16 1652

## 2016-11-19 NOTE — ED Notes (Signed)
Patient transported to CT 

## 2016-11-19 NOTE — ED Notes (Signed)
Pt has finished oral contrast. CT has been updated.

## 2016-11-19 NOTE — Progress Notes (Signed)
   Subjective:    Patient ID: Jean Morris, female    DOB: 1975-03-30, 41 y.o.   MRN: [id number]  HPI   Pt presents with a significant amount of pain (in the left side of her stomach) and nausea (has been vomiting every so often). She says she has diarrhea and has noticed blood in her stool as well for the last couple of days. This pain has been present for the past 2 days. When she stands up, she feels faint and needs to sit back down. There is some swelling present around her ankles.  Pt is not sure why the pain started. She is only able to take liquid; no solid foods. She also has no energy and "doesn't feel right".  Pt has known history of diverticulitis.   Patient Active Problem List   Diagnosis Date Noted  . Morbid obesity with BMI of 60.0-69.9, adult ([place]) 04/18/2015  . Abnormal uterine bleeding 04/18/2015  . Anemia 04/18/2015  . Pulmonary embolism ([place]) 03/11/2015   Allergies as of 11/19/2016      Reactions   Pollen Extract Nausea And Vomiting   Itchy, swelling, watery eyes, runny nose.      Medication List       Accurate as of 11/19/16 10:37 AM. Always use your most recent med list.          acetaminophen 650 MG CR tablet Commonly known as:  TYLENOL 8 HOUR Take 1 tablet (650 mg total) by mouth every 8 (eight) hours as needed for pain.   cephALEXin 500 MG capsule Commonly known as:  KEFLEX Take 1 capsule (500 mg total) by mouth 3 (three) times daily.   ferrous sulfate 325 (65 FE) MG tablet Take 1 tablet (325 mg total) by mouth 2 (two) times daily with a meal.   levonorgestrel 20 MCG/24HR IUD Commonly known as:  MIRENA 1 Intra Uterine Device (1 each total) by Intrauterine route once.   ondansetron 4 MG disintegrating tablet Commonly known as:  ZOFRAN ODT Take 1 tablet (4 mg total) by mouth every 8 (eight) hours as needed for nausea or vomiting.   oxyCODONE-acetaminophen 5-325 MG tablet Commonly known as:  PERCOCET Take 1-2 tablets by mouth every 8 (eight)  hours as needed.        Review of Systems     Objective:   Physical Exam  Constitutional: She is oriented to person, place, and time.  Cardiovascular: Normal rate, regular rhythm and normal heart sounds.   Pulmonary/Chest: Effort normal and breath sounds normal.  Neurological: She is alert and oriented to person, place, and time.   BP 123/76   Pulse 79   Temp 98 F (36.7 C)   Wt (!) 378 lb 1.6 oz (171.5 kg)   LMP 10/30/2016   BMI 62.92 kg/m   BP taken manually in room; 120/84 (sitting), 112/80 (standing)      Assessment & Plan:   Pt presents with abdominal pain and lightheadedness. She has experienced rectal bleeding for the past 2 days. She experiences orthostatic BP changes with standing. Labs taken today would not provide results for a couple of days. Pt may have anemia.   Pt is being referred urgently to ED for rectal bleeding and orthostatic BP drop.

## 2016-11-19 NOTE — ED Notes (Signed)
MD at bedside. 

## 2016-11-19 NOTE — Progress Notes (Unsigned)
Hasn't felt "right" for two days, no energy, nausea and vomiting, some feelings or disorientation

## 2016-11-19 NOTE — ED Triage Notes (Signed)
Says sent  By her doctor because they think she needs transfusion.  Has rectal bleeding x 2 days. abd pain with history of diverticulitis.  Also just feels forgetful and weak.

## 2016-11-20 LAB — CBC
Hematocrit: 33 % — ABNORMAL LOW (ref 34.0–46.6)
Hemoglobin: 11 g/dL — ABNORMAL LOW (ref 11.1–15.9)
MCH: 21.8 pg — ABNORMAL LOW (ref 26.6–33.0)
MCHC: 33.3 g/dL (ref 31.5–35.7)
MCV: 66 fL — ABNORMAL LOW (ref 79–97)
Platelets: 467 10*3/uL — ABNORMAL HIGH (ref 150–379)
RBC: 5.04 x10E6/uL (ref 3.77–5.28)
RDW: 20 % — ABNORMAL HIGH (ref 12.3–15.4)
WBC: 6.2 10*3/uL (ref 3.4–10.8)

## 2016-11-20 LAB — COMPREHENSIVE METABOLIC PANEL
ALT: 10 IU/L (ref 0–32)
AST: 15 IU/L (ref 0–40)
Albumin/Globulin Ratio: 0.9 — ABNORMAL LOW (ref 1.2–2.2)
Albumin: 3.6 g/dL (ref 3.5–5.5)
Alkaline Phosphatase: 76 IU/L (ref 39–117)
BUN/Creatinine Ratio: 7 — ABNORMAL LOW (ref 9–23)
BUN: 5 mg/dL — ABNORMAL LOW (ref 6–24)
Bilirubin Total: 0.4 mg/dL (ref 0.0–1.2)
CO2: 25 mmol/L (ref 20–29)
Calcium: 9.1 mg/dL (ref 8.7–10.2)
Chloride: 102 mmol/L (ref 96–106)
Creatinine, Ser: 0.73 mg/dL (ref 0.57–1.00)
GFR calc Af Amer: 117 mL/min/{1.73_m2} (ref >59)
GFR calc non Af Amer: 101 mL/min/{1.73_m2} (ref >59)
Globulin, Total: 4 g/dL (ref 1.5–4.5)
Glucose: 86 mg/dL (ref 65–99)
Potassium: 4.5 mmol/L (ref 3.5–5.2)
Sodium: 139 mmol/L (ref 134–144)
Total Protein: 7.6 g/dL (ref 6.0–8.5)

## 2016-11-20 LAB — IRON AND TIBC
Iron Saturation: 10 % — ABNORMAL LOW (ref 15–55)
Iron: 25 ug/dL — ABNORMAL LOW (ref 27–159)
Total Iron Binding Capacity: 247 ug/dL — ABNORMAL LOW (ref 250–450)
UIBC: 222 ug/dL (ref 131–425)

## 2016-11-20 LAB — TSH: TSH: 2.16 u[IU]/mL (ref 0.450–4.500)

## 2016-11-21 LAB — URINE CULTURE: Culture: >=100000 — AB

## 2016-11-23 NOTE — Progress Notes (Signed)
ED Antimicrobial Stewardship Positive Culture Follow Up   Eleina L Linehan is an 41 y.o. female who presented to [facility] on 11/19/2016 with a chief complaint of generalized weakness and abdominal pain.   Chief Complaint  Patient presents with  . Abdominal Pain  . Weakness  . Rectal Bleeding  . Anemia    Recent Results (from the past 720 hour(s))  Urine Culture     Status: Abnormal   Collection Time: 11/19/16  3:11 PM  Result Value Ref Range Status   Specimen Description URINE, RANDOM  Final   Special Requests NONE  Final   Culture (A)  Final    >=100,000 COLONIES/mL GROUP B STREP(S.AGALACTIAE)ISOLATED TESTING AGAINST S. AGALACTIAE NOT ROUTINELY PERFORMED DUE TO PREDICTABILITY OF AMP/PEN/VAN SUSCEPTIBILITY. Performed at [facility], [place] [address] [place], [place] [address]    Report Status 11/21/2016 FINAL  Final    []  Treated with    , organism resistant to prescribed antimicrobial [x]  Patient discharged originally without antimicrobial agent and treatment is now indicated  New antibiotic prescription:  Penicillin VK 250 mg  Take 1 tablet by mouth three times a day for 7 days  #21   0 refills   Patient preferred prescription be sent to medication management clinic. I faxed prescription over to [facility] to fill tomorrow (closed on weekends).   ED Provider: Dr. Kristine Garbe, PharmD Pharmacy Resident  11/23/2016, 1:30 PM

## 2016-11-26 ENCOUNTER — Ambulatory Visit: Payer: Self-pay | Admitting: Internal Medicine

## 2016-11-29 IMAGING — CR DG CHEST 2V
1 series · 2 of 2 positions shown · non-contrast
Comparison: 06/15/2013

CLINICAL DATA: Shortness of breath and chest pain beginning
yesterday. Cough.

EXAM:
CHEST  2 VIEW

[Series 1: w chest pa · 0.14mm/px · 2 of 2 slices shown]
[im 1/2]
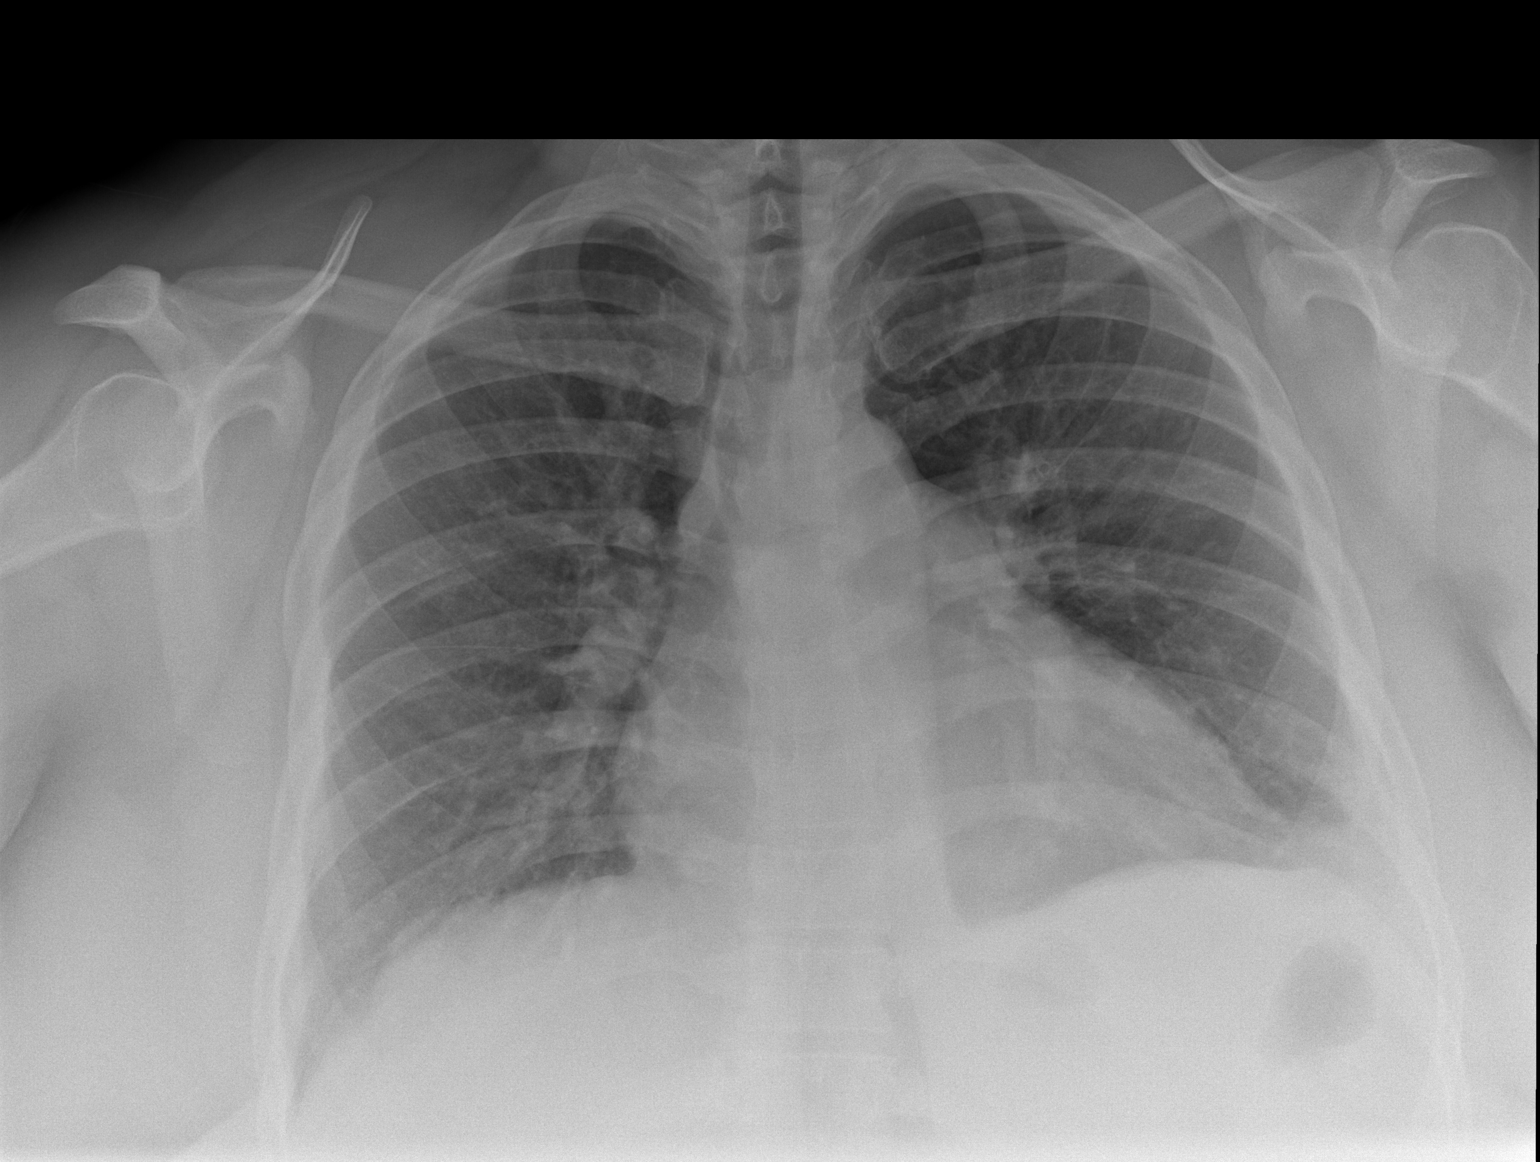
[im 2/2]
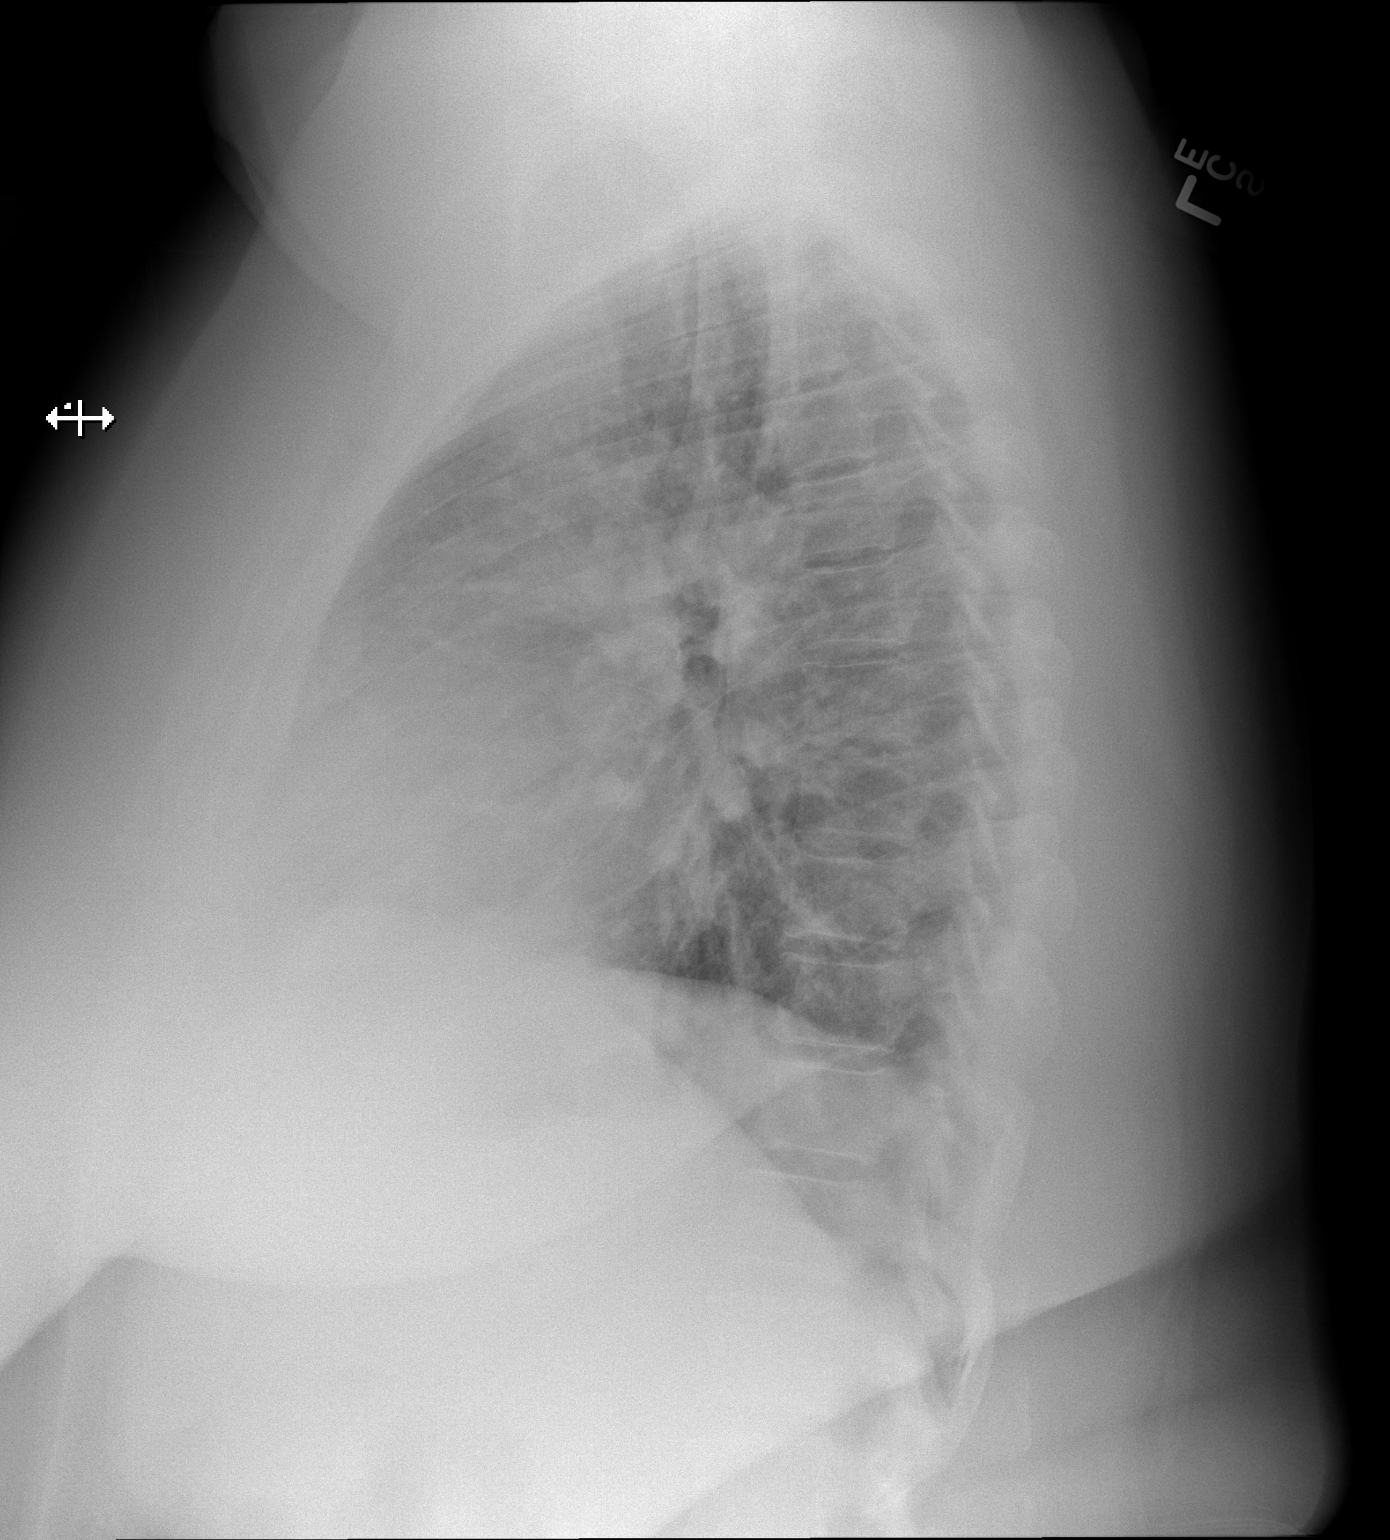

[2 of 2 positions shown; findings below may reference images not displayed]

FINDINGS: The heart size and mediastinal contours are within normal limits.
Both lungs are clear. The visualized skeletal structures are
unremarkable.
IMPRESSION: No active cardiopulmonary disease.

## 2017-03-11 ENCOUNTER — Other Ambulatory Visit: Payer: Self-pay

## 2017-03-11 ENCOUNTER — Emergency Department
Admission: EM | Admit: 2017-03-11 | Discharge: 2017-03-11 | Disposition: A | Discharge status: Home / Self Care | Payer: Self-pay | Attending: Emergency Medicine | Admitting: Emergency Medicine

## 2017-03-11 DIAGNOSIS — I10 Essential (primary) hypertension: Secondary | ICD-10-CM | POA: Insufficient documentation

## 2017-03-11 DIAGNOSIS — R112 Nausea with vomiting, unspecified: Secondary | ICD-10-CM | POA: Insufficient documentation

## 2017-03-11 DIAGNOSIS — R1084 Generalized abdominal pain: Secondary | ICD-10-CM | POA: Insufficient documentation

## 2017-03-11 DIAGNOSIS — R109 Unspecified abdominal pain: Secondary | ICD-10-CM

## 2017-03-11 LAB — URINALYSIS, COMPLETE (UACMP) WITH MICROSCOPIC
Bacteria, UA: NONE SEEN
Bilirubin Urine: NEGATIVE
Glucose, UA: NEGATIVE mg/dL
Hgb urine dipstick: NEGATIVE
Ketones, ur: NEGATIVE mg/dL
Leukocytes, UA: NEGATIVE
Nitrite: NEGATIVE
Protein, ur: NEGATIVE mg/dL
Specific Gravity, Urine: 1.027 (ref 1.005–1.030)
pH: 5 (ref 5.0–8.0)

## 2017-03-11 LAB — COMPREHENSIVE METABOLIC PANEL
ALT: 13 U/L — ABNORMAL LOW (ref 14–54)
AST: 19 U/L (ref 15–41)
Albumin: 3.5 g/dL (ref 3.5–5.0)
Alkaline Phosphatase: 71 U/L (ref 38–126)
Anion gap: 8 (ref 5–15)
BUN: 12 mg/dL (ref 6–20)
CO2: 26 mmol/L (ref 22–32)
Calcium: 8.7 mg/dL — ABNORMAL LOW (ref 8.9–10.3)
Chloride: 104 mmol/L (ref 101–111)
Creatinine, Ser: 0.78 mg/dL (ref 0.44–1.00)
GFR calc Af Amer: >60 mL/min (ref >60)
GFR calc non Af Amer: >60 mL/min (ref >60)
Glucose, Bld: 118 mg/dL — ABNORMAL HIGH (ref 65–99)
Potassium: 3.8 mmol/L (ref 3.5–5.1)
Sodium: 138 mmol/L (ref 135–145)
Total Bilirubin: 0.5 mg/dL (ref 0.3–1.2)
Total Protein: 8.2 g/dL — ABNORMAL HIGH (ref 6.5–8.1)

## 2017-03-11 LAB — CBC
HCT: 31.3 % — ABNORMAL LOW (ref 35.0–47.0)
Hemoglobin: 10.2 g/dL — ABNORMAL LOW (ref 12.0–16.0)
MCH: 20.2 pg — ABNORMAL LOW (ref 26.0–34.0)
MCHC: 32.6 g/dL (ref 32.0–36.0)
MCV: 61.8 fL — ABNORMAL LOW (ref 80.0–100.0)
Platelets: 459 10*3/uL — ABNORMAL HIGH (ref 150–440)
RBC: 5.06 MIL/uL (ref 3.80–5.20)
RDW: 19.3 % — ABNORMAL HIGH (ref 11.5–14.5)
WBC: 6.9 10*3/uL (ref 3.6–11.0)

## 2017-03-11 LAB — LIPASE, BLOOD: Lipase: 39 U/L (ref 11–51)

## 2017-03-11 LAB — POCT PREGNANCY, URINE: Preg Test, Ur: NEGATIVE

## 2017-03-11 MED ORDER — ONDANSETRON HCL 4 MG PO TABS: 4.0000 mg | ORAL_TABLET | Freq: Three times a day (TID) | ORAL | 0 refills | Status: DC | PRN

## 2017-03-11 NOTE — ED Provider Notes (Signed)
[facility] Emergency Department Provider Note  ____________________________________________  Time seen: Approximately 10:44 PM  I have reviewed the triage vital signs and the nursing notes.   HISTORY  Chief Complaint Emesis and Abdominal Pain    HPI Jean Morris is a 42 y.o. female with a history of hypertension and morbid obesity presenting with nausea vomiting and abdominal cramping.  The patient reports that she ate a chicken leg C, and shortly thereafter developed 2 episodes of nausea and vomiting with severe abdominal cramping.  She did not have any diarrhea or constipation, fevers or chills, or any urinary symptoms.  She has been tolerating liquids here and feels completely back to normal at this time.  Past Medical History:  Diagnosis Date  . Anemia   . Hypertension   . Menorrhagia   . Pulmonary embolism [facility])     Patient Active Problem List   Diagnosis Date Noted  . Morbid obesity with BMI of 60.0-69.9, adult ([place]) 04/18/2015  . Abnormal uterine bleeding 04/18/2015  . Anemia 04/18/2015  . Pulmonary embolism ([place]) 03/11/2015    Past Surgical History:  Procedure Laterality Date  . CESAREAN SECTION  1994    Current Outpatient Rx  . Order #: [id number] Class: Normal  . Order #: [id number] Class: Print  . Order #: [id number] Class: Normal  . Order #: [id number] Class: No Print  . Order #: [id number] Class: Print  . Order #: [id number] Class: Print  . Order #: [id number] Class: Print    Allergies Pollen extract  Family History  Problem Relation Age of Onset  . Hypertension Mother   . Stroke Mother   . Heart attack Mother   . Hypertension Father   . Hypertension Other     Social History Social History   Tobacco Use  . Smoking status: Never Smoker  . Smokeless tobacco: Never Used  Substance Use Topics  . Alcohol use: No  . Drug use: No    Review of Systems Constitutional: No fever/chills.  No lightheadedness or syncope. Eyes: No  visual changes. ENT: No sore throat. No congestion or rhinorrhea. Cardiovascular: Denies chest pain. Denies palpitations. Respiratory: Denies shortness of breath.  No cough. Gastrointestinal: Diffuse nonfocal abdominal abdominal cramping, now resolved.  +nausea, +vomiting, now resolved.  No diarrhea.  No constipation. Genitourinary: Negative for dysuria. Musculoskeletal: Negative for back pain. Skin: Negative for rash. Neurological: Negative for headaches. No focal numbness, tingling or weakness.     ____________________________________________   PHYSICAL EXAM:  VITAL SIGNS: ED Triage Vitals  Enc Vitals Group     BP 03/11/17 2004 138/88     Pulse Rate 03/11/17 2004 97     Resp 03/11/17 2004 20     Temp 03/11/17 2004 98.6 F (37 C)     Temp Source 03/11/17 2004 Oral     SpO2 03/11/17 2004 98 %     Weight 03/11/17 2005 (!) 340 lb (154.2 kg)     Height 03/11/17 2005 5' 5"  (1.651 m)     Head Circumference --      Peak Flow --      Pain Score 03/11/17 2004 8     Pain Loc --      Pain Edu? --      Excl. in [place]? --     Constitutional: Alert and oriented.  Morbidly obese Comfortable appearing. Well appearing and in no acute distress. Answers questions appropriately. Eyes: Conjunctivae are normal.  EOMI. No scleral icterus. Head: Atraumatic. Nose: No congestion/rhinnorhea. Mouth/Throat: Mucous membranes are moist.  Neck: No stridor.  Supple.   Cardiovascular: Normal rate, regular rhythm. No murmurs, rubs or gallops.  Respiratory: Normal respiratory effort.  No accessory muscle use or retractions. Lungs CTAB.  No wheezes, rales or ronchi. Gastrointestinal: Soft, nontender and nondistended.  No guarding or rebound.  No peritoneal signs. Musculoskeletal: No LE edema.  Neurologic:  A&Ox3.  Speech is clear.  Face and smile are symmetric.  EOMI.  Moves all extremities well. Skin:  Skin is warm, dry and intact. No rash noted. Psychiatric: Mood and affect are normal. Speech and  behavior are normal.  Normal judgement.  ____________________________________________   LABS (all labs ordered are listed, but only abnormal results are displayed)  Labs Reviewed  COMPREHENSIVE METABOLIC PANEL - Abnormal; Notable for the following components:      Result Value   Glucose, Bld 118 (*)    Calcium 8.7 (*)    Total Protein 8.2 (*)    ALT 13 (*)    All other components within normal limits  CBC - Abnormal; Notable for the following components:   Hemoglobin 10.2 (*)    HCT 31.3 (*)    MCV 61.8 (*)    MCH 20.2 (*)    RDW 19.3 (*)    Platelets 459 (*)    All other components within normal limits  URINALYSIS, COMPLETE (UACMP) WITH MICROSCOPIC - Abnormal; Notable for the following components:   Color, Urine YELLOW (*)    APPearance HAZY (*)    Squamous Epithelial / LPF 0-5 (*)    All other components within normal limits  LIPASE, BLOOD  POC URINE PREG, ED  POCT PREGNANCY, URINE   ____________________________________________  EKG  Not indicated ____________________________________________  RADIOLOGY  No results found.  ____________________________________________   PROCEDURES  Procedure(s) performed: None  Procedures  Critical Care performed: No ____________________________________________   INITIAL IMPRESSION / ASSESSMENT AND PLAN / ED COURSE  Pertinent labs & imaging results that were available during my care of the patient were reviewed by me and considered in my medical decision making (see chart for details).   42 y.o. female who had several episodes of nausea and vomiting with abdominal cramping after eating a chicken when a [employer], symptoms completely resolved.  Overall, the patient is hemodynamically stable and afebrile.  She has no abdominal discomfort on my examination.  It is likely that she had a foodborne illness or food sensitivity reaction.  Viral GI illness is also possible although would have expected some diarrhea with this.  This also  could be early appendicitis and I have given her strict return precautions.  The patient's laboratory studies are reassuring.  Her lipase is normal, her pregnancy test is negative.  She does have a chronic anemia that is unchanged.  No UTI.  At this time, the patient is safe for discharge.  Return precautions as well as follow-up instructions were discussed.   ____________________________________________  FINAL CLINICAL IMPRESSION(S) / ED DIAGNOSES  Final diagnoses:  Non-intractable vomiting with nausea, unspecified vomiting type  Abdominal cramping         NEW MEDICATIONS STARTED DURING THIS VISIT:  New Prescriptions   ONDANSETRON (ZOFRAN) 4 MG TABLET    Take 1 tablet (4 mg total) by mouth every 8 (eight) hours as needed for nausea or vomiting.      Eula Listen, MD 03/11/17 2248

## 2017-03-11 NOTE — ED Triage Notes (Signed)
Pt has low abd pain with vomiting x 2 today.  No diarrhea.  Pt reports left side lower back pain pain.  No urinary sx.  Pt alert.

## 2017-03-11 NOTE — Discharge Instructions (Signed)
Please drink plenty of fluids to stay well-hydrated.  Please eat a bland diet for 24 hours, then you may resume your normal diet.  Please return to the emergency department if you develop severe pain, fever, inability to keep down fluids, or any other symptoms concerning to you.

## 2017-03-11 NOTE — ED Notes (Signed)
Patient states she believes she got food poisoning from [facility]. States she became sick right after eating it. States "I feel much better now.".

## 2017-06-09 ENCOUNTER — Emergency Department
Admission: EM | Admit: 2017-06-09 | Discharge: 2017-06-10 | Disposition: A | Discharge status: Home / Self Care | Payer: Self-pay | Attending: Emergency Medicine | Admitting: Emergency Medicine

## 2017-06-09 ENCOUNTER — Other Ambulatory Visit: Payer: Self-pay

## 2017-06-09 ENCOUNTER — Emergency Department: Payer: Self-pay

## 2017-06-09 ENCOUNTER — Encounter: Payer: Self-pay | Admitting: Emergency Medicine

## 2017-06-09 DIAGNOSIS — I10 Essential (primary) hypertension: Secondary | ICD-10-CM | POA: Insufficient documentation

## 2017-06-09 DIAGNOSIS — M5440 Lumbago with sciatica, unspecified side: Secondary | ICD-10-CM | POA: Insufficient documentation

## 2017-06-09 DIAGNOSIS — E871 Hypo-osmolality and hyponatremia: Secondary | ICD-10-CM | POA: Insufficient documentation

## 2017-06-09 DIAGNOSIS — Z79899 Other long term (current) drug therapy: Secondary | ICD-10-CM | POA: Insufficient documentation

## 2017-06-09 LAB — LIPASE, BLOOD: Lipase: 37 U/L (ref 11–51)

## 2017-06-09 LAB — CBC
HCT: 29.1 % — ABNORMAL LOW (ref 35.0–47.0)
Hemoglobin: 9.5 g/dL — ABNORMAL LOW (ref 12.0–16.0)
MCH: 19.8 pg — ABNORMAL LOW (ref 26.0–34.0)
MCHC: 32.7 g/dL (ref 32.0–36.0)
MCV: 60.5 fL — ABNORMAL LOW (ref 80.0–100.0)
Platelets: 512 10*3/uL — ABNORMAL HIGH (ref 150–440)
RBC: 4.81 MIL/uL (ref 3.80–5.20)
RDW: 19.1 % — ABNORMAL HIGH (ref 11.5–14.5)
WBC: 6.7 10*3/uL (ref 3.6–11.0)

## 2017-06-09 LAB — URINALYSIS, COMPLETE (UACMP) WITH MICROSCOPIC
Bacteria, UA: NONE SEEN
Bilirubin Urine: NEGATIVE
Glucose, UA: NEGATIVE mg/dL
Ketones, ur: NEGATIVE mg/dL
Leukocytes, UA: NEGATIVE
Nitrite: NEGATIVE
Protein, ur: NEGATIVE mg/dL
Specific Gravity, Urine: 1.018 (ref 1.005–1.030)
pH: 6 (ref 5.0–8.0)

## 2017-06-09 LAB — COMPREHENSIVE METABOLIC PANEL
ALT: 13 U/L — ABNORMAL LOW (ref 14–54)
AST: 16 U/L (ref 15–41)
Albumin: 3.5 g/dL (ref 3.5–5.0)
Alkaline Phosphatase: 67 U/L (ref 38–126)
Anion gap: 6 (ref 5–15)
BUN: 10 mg/dL (ref 6–20)
CO2: 25 mmol/L (ref 22–32)
Calcium: 8.4 mg/dL — ABNORMAL LOW (ref 8.9–10.3)
Chloride: 98 mmol/L — ABNORMAL LOW (ref 101–111)
Creatinine, Ser: 0.7 mg/dL (ref 0.44–1.00)
GFR calc Af Amer: >60 mL/min (ref >60)
GFR calc non Af Amer: >60 mL/min (ref >60)
Glucose, Bld: 91 mg/dL (ref 65–99)
Potassium: 3.9 mmol/L (ref 3.5–5.1)
Sodium: 129 mmol/L — ABNORMAL LOW (ref 135–145)
Total Bilirubin: 0.4 mg/dL (ref 0.3–1.2)
Total Protein: 8.3 g/dL — ABNORMAL HIGH (ref 6.5–8.1)

## 2017-06-09 LAB — TROPONIN I: Troponin I: <0.03 ng/mL (ref <0.03)

## 2017-06-09 LAB — PREGNANCY, URINE: Preg Test, Ur: NEGATIVE

## 2017-06-09 MED ORDER — SODIUM CHLORIDE 0.9 % IV BOLUS
1000.0000 mL | Freq: Once | INTRAVENOUS | Status: AC
  Administered 2017-06-09: 1000 mL via INTRAVENOUS

## 2017-06-09 NOTE — Discharge Instructions (Addendum)
Please use Tylenol or Motrin as needed for the pain.  Drink plenty of fluids daily.  Return to the ER for worsening symptoms, persistent vomiting, difficulty breathing or other concerns.

## 2017-06-09 NOTE — ED Notes (Signed)
Patient transported to X-ray 

## 2017-06-09 NOTE — ED Triage Notes (Signed)
Says had abd pain started couple days ago low abd pressure.  Says she then fell on Saturday after slipping.  Now has pain entire left side and also says dizzy spells.

## 2017-06-09 NOTE — ED Provider Notes (Signed)
[facility] Emergency Department Provider Note   ____________________________________________   First MD Initiated Contact with Patient 06/09/17 2113     (approximate)  I have reviewed the triage vital signs and the nursing notes.   HISTORY  Chief Complaint Abdominal Pain; Fall; Back Pain; and Leg Pain    HPI Jean Morris is a 42 y.o. female patient reports intermittent urinary frequency and urgency with some left lower quadrant abdominal pressure which is very mild.  She also reports pain in her left hip and left back which got worse after she fell.  80 pain radiates from the left side of the back into the hip and down the left leg some distance but not to the knee.  Patient reports she slipped and fell on Saturday and now the left side is getting more pain.  She feels that she feels a little weak sometimes.  She did not tell me she had dizzy spells.   Past Medical History:  Diagnosis Date  . Anemia   . Hypertension   . Menorrhagia   . Pulmonary embolism [facility])     Patient Active Problem List   Diagnosis Date Noted  . Morbid obesity with BMI of 60.0-69.9, adult ([place]) 04/18/2015  . Abnormal uterine bleeding 04/18/2015  . Anemia 04/18/2015  . Pulmonary embolism ([place]) 03/11/2015    Past Surgical History:  Procedure Laterality Date  . [place]    Prior to Admission medications   Medication Sig Start Date End Date Taking? Authorizing Provider  acetaminophen (TYLENOL 8 HOUR) 650 MG CR tablet Take 1 tablet (650 mg total) by mouth every 8 (eight) hours as needed for pain. 04/18/15   Rubie Maid, MD  cephALEXin (KEFLEX) 500 MG capsule Take 1 capsule (500 mg total) by mouth 3 (three) times daily. Patient not taking: Reported on 10/30/2016 03/28/16   Hagler, Jami L, PA-C  ferrous sulfate 325 (65 FE) MG tablet Take 1 tablet (325 mg total) by mouth 2 (two) times daily with a meal. Patient taking differently: Take 325 mg by mouth 3 (three)  times daily with meals.  05/28/16 05/28/17  Tawni Millers, MD  levonorgestrel (MIRENA) 20 MCG/24HR IUD 1 Intra Uterine Device (1 each total) by Intrauterine route once. 04/18/15   Rubie Maid, MD  ondansetron (ZOFRAN ODT) 4 MG disintegrating tablet Take 1 tablet (4 mg total) by mouth every 8 (eight) hours as needed for nausea or vomiting. 10/30/16   Earleen Newport, MD  ondansetron (ZOFRAN) 4 MG tablet Take 1 tablet (4 mg total) by mouth every 8 (eight) hours as needed for nausea or vomiting. 03/11/17 03/11/18  Eula Listen, MD  oxyCODONE-acetaminophen (PERCOCET) 5-325 MG tablet Take 1-2 tablets by mouth every 8 (eight) hours as needed. Patient not taking: Reported on 11/05/2016 10/30/16   Earleen Newport, MD    Allergies Pollen extract  Family History  Problem Relation Age of Onset  . Hypertension Mother   . Stroke Mother   . Heart attack Mother   . Hypertension Father   . Hypertension Other     Social History Social History   Tobacco Use  . Smoking status: Never Smoker  . Smokeless tobacco: Never Used  Substance Use Topics  . Alcohol use: No  . Drug use: No    Review of Systems  Constitutional: No fever/chills Eyes: No visual changes. ENT: No sore throat. Cardiovascular: Denies chest pain. Respiratory: Denies shortness of breath. Gastrointestinal: No abdominal pain.  No nausea,  no vomiting.  No diarrhea.  No constipation. Genitourinary: Negative for dysuria. Musculoskeletal:  back pain. Skin: Negative for rash. Neurological: Negative for headaches, focal weakness   ____________________________________________   PHYSICAL EXAM:  VITAL SIGNS: ED Triage Vitals  Enc Vitals Group     BP 06/09/17 1409 (!) 134/97     Pulse Rate 06/09/17 1409 83     Resp 06/09/17 1409 16     Temp 06/09/17 1409 98.9 F (37.2 C)     Temp Source 06/09/17 1409 Oral     SpO2 06/09/17 1409 99 %     Weight 06/09/17 1410 (!) 347 lb (157.4 kg)     Height 06/09/17 1410 5' 4"   (1.626 m)     Head Circumference --      Peak Flow --      Pain Score 06/09/17 1410 9     Pain Loc --      Pain Edu? --      Excl. in [place]? --     Constitutional: Alert and oriented. Well appearing and in no acute distress. Eyes: Conjunctivae are normal.  Head: Atraumatic. Nose: No congestion/rhinnorhea. Mouth/Throat: Mucous membranes are moist.  Oropharynx non-erythematous. Neck: No stridor. Cardiovascular: Normal rate, regular rhythm. Grossly normal heart sounds.  Good peripheral circulation. Respiratory: Normal respiratory effort.  No retractions. Lungs CTAB. Gastrointestinal: Soft and nontender.  On palpation of the abdomen patient reports a very mild amount of pressure in the low left side of the abdomen.  There is no pain though.  No distention. No abdominal bruits. No CVA tenderness. Musculoskeletal: No lower extremity tenderness nor edema.  Straight leg raise is positive at 5 degrees on the left and the pain radiating down toward the knee.  Back is not tender to palpation Neurologic:  Normal speech and language. No gross focal neurologic deficits are appreciated.  No motor or sensory changes in the legs.. Skin:  Skin is warm, dry and intact. No rash noted. Psychiatric: Mood and affect are normal. Speech and behavior are normal.  ____________________________________________   LABS (all labs ordered are listed, but only abnormal results are displayed)  Labs Reviewed  COMPREHENSIVE METABOLIC PANEL - Abnormal; Notable for the following components:      Result Value   Sodium 129 (*)    Chloride 98 (*)    Calcium 8.4 (*)    Total Protein 8.3 (*)    ALT 13 (*)    All other components within normal limits  CBC - Abnormal; Notable for the following components:   Hemoglobin 9.5 (*)    HCT 29.1 (*)    MCV 60.5 (*)    MCH 19.8 (*)    RDW 19.1 (*)    Platelets 512 (*)    All other components within normal limits  URINALYSIS, COMPLETE (UACMP) WITH MICROSCOPIC - Abnormal; Notable  for the following components:   Color, Urine YELLOW (*)    APPearance CLEAR (*)    Hgb urine dipstick MODERATE (*)    Squamous Epithelial / LPF 0-5 (*)    All other components within normal limits  LIPASE, BLOOD  PREGNANCY, URINE  TROPONIN I  BASIC METABOLIC PANEL   ____________________________________________  EKG  EKG read and interpreted by me shows normal sinus rhythm rate of 81 normal axis basically normal EKG ____________________________________________  RADIOLOGY  ED MD interpretation: Hip x-ray looks okay spine shows some mild DJD no fractures or anything to explain her pain.  Rest x-rays cut off on the bottom but otherwise  looks okay to me especially on the lateral.  Will await for the radiologist report  Official radiology report(s): Dg Lumbar Spine Complete  Result Date: 06/09/2017 CLINICAL DATA:  Left-sided pain EXAM: LUMBAR SPINE - COMPLETE 4+ VIEW COMPARISON:  CT 11/19/2016 FINDINGS: Lumbar alignment within normal limits. Mild degenerative changes at L1-L2 and L5-S1. Vertebral body heights are normal. IMPRESSION: Mild degenerative changes.  No acute osseous abnormality Electronically Signed   By: Donavan Foil M.D.   On: 06/09/2017 22:35   Dg Hip Unilat W Or Wo Pelvis 2-3 Views Left  Result Date: 06/09/2017 CLINICAL DATA:  Abdominal pain EXAM: DG HIP (WITH OR WITHOUT PELVIS) 2-3V LEFT COMPARISON:  09/24/2014 FINDINGS: No fracture or malalignment. Mild degenerative changes of the left hip. Pubic symphysis and rami are intact. IMPRESSION: No acute osseous abnormality. Electronically Signed   By: Donavan Foil M.D.   On: 06/09/2017 22:36    ____________________________________________   PROCEDURES  Procedure(s) performed:   Procedures  Critical Care performed:   ____________________________________________   INITIAL IMPRESSION / ASSESSMENT AND PLAN / ED COURSE  Patient sodium is low 129.  I am giving her some IV saline and plan on rechecking the BMP after  the saline is in.  However the IV is running very slowly. ----------------------------------------- 10:55 PM on 06/09/2017 -----------------------------------------  I will sign out this patient to Dr. Archie Balboa.  He is aware of the patient's labs and the plan.  Patient has no obvious bleeding that I can see.         ____________________________________________   FINAL CLINICAL IMPRESSION(S) / ED DIAGNOSES  Final diagnoses:  Low back pain with sciatica, sciatica laterality unspecified, unspecified back pain laterality, unspecified chronicity  Hyponatremia     ED Discharge Orders    None       Note:  This document was prepared using Dragon voice recognition software and may include unintentional dictation errors.    Nena Polio, MD 06/09/17 2256

## 2017-06-10 LAB — BASIC METABOLIC PANEL
Anion gap: 5 (ref 5–15)
BUN: 10 mg/dL (ref 6–20)
CO2: 26 mmol/L (ref 22–32)
Calcium: 8 mg/dL — ABNORMAL LOW (ref 8.9–10.3)
Chloride: 101 mmol/L (ref 101–111)
Creatinine, Ser: 0.71 mg/dL (ref 0.44–1.00)
GFR calc Af Amer: >60 mL/min (ref >60)
GFR calc non Af Amer: >60 mL/min (ref >60)
Glucose, Bld: 124 mg/dL — ABNORMAL HIGH (ref 65–99)
Potassium: 3.3 mmol/L — ABNORMAL LOW (ref 3.5–5.1)
Sodium: 132 mmol/L — ABNORMAL LOW (ref 135–145)

## 2017-06-10 NOTE — ED Notes (Signed)
Patient ambulatory to lobby with steady gait. NAD noted at this time. Verbalized understanding of discharge instructions and follow-up care. Walked to car by [employer].

## 2017-06-10 NOTE — ED Provider Notes (Signed)
-----------------------------------------   1:40 AM on 06/10/2017 -----------------------------------------  Repeat sodium improved after IV fluids.  Patient sleeping soundly in no acute distress.  Updated her of improved lab results.  Strict return precautions given.  Patient verbalizes understanding and agrees with plan of care.   Paulette Blanch, MD 06/10/17 [phone]

## 2017-08-31 ENCOUNTER — Emergency Department
Admission: EM | Admit: 2017-08-31 | Discharge: 2017-08-31 | Disposition: A | Discharge status: Home / Self Care | Payer: Self-pay | Attending: Emergency Medicine | Admitting: Emergency Medicine

## 2017-08-31 ENCOUNTER — Emergency Department: Payer: Self-pay

## 2017-08-31 ENCOUNTER — Encounter: Payer: Self-pay | Admitting: Emergency Medicine

## 2017-08-31 ENCOUNTER — Other Ambulatory Visit: Payer: Self-pay

## 2017-08-31 DIAGNOSIS — Z79899 Other long term (current) drug therapy: Secondary | ICD-10-CM | POA: Insufficient documentation

## 2017-08-31 DIAGNOSIS — R0602 Shortness of breath: Secondary | ICD-10-CM | POA: Insufficient documentation

## 2017-08-31 DIAGNOSIS — R079 Chest pain, unspecified: Secondary | ICD-10-CM

## 2017-08-31 DIAGNOSIS — M546 Pain in thoracic spine: Secondary | ICD-10-CM | POA: Insufficient documentation

## 2017-08-31 DIAGNOSIS — Z86711 Personal history of pulmonary embolism: Secondary | ICD-10-CM | POA: Insufficient documentation

## 2017-08-31 DIAGNOSIS — R0789 Other chest pain: Secondary | ICD-10-CM | POA: Insufficient documentation

## 2017-08-31 DIAGNOSIS — I10 Essential (primary) hypertension: Secondary | ICD-10-CM | POA: Insufficient documentation

## 2017-08-31 LAB — BASIC METABOLIC PANEL
Anion gap: 8 (ref 5–15)
BUN: 9 mg/dL (ref 6–20)
CO2: 25 mmol/L (ref 22–32)
Calcium: 8.8 mg/dL — ABNORMAL LOW (ref 8.9–10.3)
Chloride: 102 mmol/L (ref 98–111)
Creatinine, Ser: 0.7 mg/dL (ref 0.44–1.00)
GFR calc Af Amer: >60 mL/min (ref >60)
GFR calc non Af Amer: >60 mL/min (ref >60)
Glucose, Bld: 99 mg/dL (ref 70–99)
Potassium: 3.7 mmol/L (ref 3.5–5.1)
Sodium: 135 mmol/L (ref 135–145)

## 2017-08-31 LAB — CBC
HCT: 28.5 % — ABNORMAL LOW (ref 35.0–47.0)
Hemoglobin: 9.4 g/dL — ABNORMAL LOW (ref 12.0–16.0)
MCH: 18.9 pg — ABNORMAL LOW (ref 26.0–34.0)
MCHC: 33.1 g/dL (ref 32.0–36.0)
MCV: 57 fL — ABNORMAL LOW (ref 80.0–100.0)
Platelets: 530 10*3/uL — ABNORMAL HIGH (ref 150–440)
RBC: 5 MIL/uL (ref 3.80–5.20)
RDW: 20.3 % — ABNORMAL HIGH (ref 11.5–14.5)
WBC: 7.6 10*3/uL (ref 3.6–11.0)

## 2017-08-31 LAB — TROPONIN I
Troponin I: <0.03 ng/mL (ref <0.03)
Troponin I: <0.03 ng/mL (ref <0.03)

## 2017-08-31 LAB — BRAIN NATRIURETIC PEPTIDE: B Natriuretic Peptide: 31 pg/mL (ref 0.0–100.0)

## 2017-08-31 LAB — POCT PREGNANCY, URINE: Preg Test, Ur: NEGATIVE

## 2017-08-31 MED ORDER — KETOROLAC TROMETHAMINE 30 MG/ML IJ SOLN: 30.0000 mg | Freq: Once | INTRAMUSCULAR | Status: DC

## 2017-08-31 MED ORDER — IOPAMIDOL (ISOVUE-370) INJECTION 76%
100.0000 mL | Freq: Once | INTRAVENOUS | Status: AC | PRN
  Administered 2017-08-31: 100 mL via INTRAVENOUS

## 2017-08-31 NOTE — ED Provider Notes (Addendum)
[facility] Emergency Department Provider Note  ____________________________________________   I have reviewed the triage vital signs and the nursing notes. Where available I have reviewed prior notes and, if possible and indicated, outside hospital notes.    HISTORY  Chief Complaint Chest Pain and Back Pain    HPI Jean Morris is a 42 y.o. female  Presents here today complaining of chest wall pain and back pain which is been going on since this morning.  She thinks she slept wrong.  Feels like a pulled muscle however given history of PEs 2 years ago for which she no longer is supposed to be or is taking anti-coagulation, patient wanted to make sure nothing else was going on.  She is always a little short of breath and does not feel more short of breath this time.  She states she woke up after "sleeping funny" with pain in her upper back and pain in her chest wall.  Is worse when she touches it or changes position, it is a "soreness" with no radiation, the back pain started first.  Patient states she became very anxious about this and wanted to make sure she did not have a recurrence of her pulmonary emboli.  The pain was not tearing or maximum intensity at onset, no alleviating or aggravating symptoms, did not take any medications for prior to coming in      Past Medical History:  Diagnosis Date  . Anemia   . Hypertension   . Menorrhagia   . Pulmonary embolism [facility])     Patient Active Problem List   Diagnosis Date Noted  . Morbid obesity with BMI of 60.0-69.9, adult ([place]) 04/18/2015  . Abnormal uterine bleeding 04/18/2015  . Anemia 04/18/2015  . Pulmonary embolism ([place]) 03/11/2015    Past Surgical History:  Procedure Laterality Date  . [place]    Prior to Admission medications   Medication Sig Start Date End Date Taking? Authorizing Provider  acetaminophen (TYLENOL 8 HOUR) 650 MG CR tablet Take 1 tablet (650 mg total) by mouth  every 8 (eight) hours as needed for pain. 04/18/15  Yes Rubie Maid, MD  ferrous sulfate 325 (65 FE) MG tablet Take 1 tablet (325 mg total) by mouth 2 (two) times daily with a meal. Patient taking differently: Take 325 mg by mouth 3 (three) times daily with meals.  05/28/16 08/31/17 Yes Tawni Millers, MD  levonorgestrel (MIRENA) 20 MCG/24HR IUD 1 Intra Uterine Device (1 each total) by Intrauterine route once. 04/18/15  Yes Rubie Maid, MD  ondansetron (ZOFRAN ODT) 4 MG disintegrating tablet Take 1 tablet (4 mg total) by mouth every 8 (eight) hours as needed for nausea or vomiting. Patient not taking: Reported on 08/31/2017 10/30/16   Earleen Newport, MD  ondansetron (ZOFRAN) 4 MG tablet Take 1 tablet (4 mg total) by mouth every 8 (eight) hours as needed for nausea or vomiting. Patient not taking: Reported on 08/31/2017 03/11/17 03/11/18  Eula Listen, MD  oxyCODONE-acetaminophen (PERCOCET) 5-325 MG tablet Take 1-2 tablets by mouth every 8 (eight) hours as needed. Patient not taking: Reported on 11/05/2016 10/30/16   Earleen Newport, MD    Allergies Pollen extract  Family History  Problem Relation Age of Onset  . Hypertension Mother   . Stroke Mother   . Heart attack Mother   . Hypertension Father   . Hypertension Other     Social History Social History   Tobacco Use  . Smoking status:  Never Smoker  . Smokeless tobacco: Never Used  Substance Use Topics  . Alcohol use: No  . Drug use: No    Review of Systems Constitutional: No fever/chills Eyes: No visual changes. ENT: No sore throat. No stiff neck no neck pain Cardiovascular: See HPI. Respiratory: Denies shortness of breath. Gastrointestinal:   no vomiting.  No diarrhea.  No constipation. Genitourinary: Negative for dysuria. Musculoskeletal: Negative lower extremity swelling Skin: Negative for rash. Neurological: Negative for severe headaches, focal weakness or  numbness.   ____________________________________________   PHYSICAL EXAM:  VITAL SIGNS: ED Triage Vitals  Enc Vitals Group     BP 08/31/17 1314 (!) 153/75     Pulse Rate 08/31/17 1314 87     Resp 08/31/17 1314 18     Temp 08/31/17 1314 98.7 F (37.1 C)     Temp Source 08/31/17 1314 Oral     SpO2 08/31/17 1314 99 %     Weight 08/31/17 1315 (!) 364 lb (165.1 kg)     Height 08/31/17 1315 5' 5"  (1.651 m)     Head Circumference --      Peak Flow --      Pain Score 08/31/17 1315 9     Pain Loc --      Pain Edu? --      Excl. in [place]? --     Constitutional: Alert and oriented. Well appearing and in no acute distress. Eyes: Conjunctivae are normal Head: Atraumatic HEENT: No congestion/rhinnorhea. Mucous membranes are moist.  Oropharynx non-erythematous Neck:   Nontender with no meningismus, no masses, no stridor Cardiovascular: Normal rate, regular rhythm. Grossly normal heart sounds.  Good peripheral circulation. Chest: Left side of the chest there is tenderness palpation which reproduces her pain.  When I touch this area she states "ouch that the pain right there" and pulls back.  There is no evidence of shingles, crepitus for the chest or other acute pathology. Respiratory: Normal respiratory effort.  No retractions. Lungs CTAB. Abdominal: Soft and nontender. No distention. No guarding no rebound Back: There is tenderness palpation in the thoracic region in the paraspinal muscles of the upper left back, this reproduces her pain no shingles or other lesions noted.  there is no midline tenderness there are no lesions noted. there is no CVA tenderness Musculoskeletal: No lower extremity tenderness, no upper extremity tenderness. No joint effusions, no DVT signs strong distal pulses no edema Neurologic:  Normal speech and language. No gross focal neurologic deficits are appreciated.  Skin:  Skin is warm, dry and intact. No rash noted. Psychiatric: Mood and affect are normal. Speech and  behavior are normal.  ____________________________________________   LABS (all labs ordered are listed, but only abnormal results are displayed)  Labs Reviewed  BASIC METABOLIC PANEL - Abnormal; Notable for the following components:      Result Value   Calcium 8.8 (*)    All other components within normal limits  CBC - Abnormal; Notable for the following components:   Hemoglobin 9.4 (*)    HCT 28.5 (*)    MCV 57.0 (*)    MCH 18.9 (*)    RDW 20.3 (*)    Platelets 530 (*)    All other components within normal limits  TROPONIN I  BRAIN NATRIURETIC PEPTIDE  TROPONIN I  POCT PREGNANCY, URINE  POC URINE PREG, ED    Pertinent labs  results that were available during my care of the patient were reviewed by me and considered in my medical  decision making (see chart for details). ____________________________________________  EKG  I personally interpreted any EKGs ordered by me or triage Sinus rhythm rate 94 bpm no acute ST elevation or ST depression, no changes from prior EKG no acute ischemia ____________________________________________  RADIOLOGY  Pertinent labs & imaging results that were available during my care of the patient were reviewed by me and considered in my medical decision making (see chart for details). If possible, patient and/or family made aware of any abnormal findings.  Dg Chest 2 View  Result Date: 08/31/2017 CLINICAL DATA:  Chest pain since this morning after waking up, mid back pain, dull pain radiating to mid chest with some shortness of breath on exertion, history of pulmonary emboli, hypertension EXAM: CHEST - 2 VIEW COMPARISON:  06/09/2017 FINDINGS: Enlargement of cardiac silhouette with pulmonary vascular congestion. Mediastinal contours normal. No definite acute infiltrate, pleural effusion or pneumothorax. Bones unremarkable. IMPRESSION: Enlargement of cardiac silhouette with pulmonary vascular congestion. No acute infiltrate. Electronically Signed   By:  Lavonia Dana M.D.   On: 08/31/2017 13:56   Ct Angio Chest Pe W And/or Wo Contrast  Result Date: 08/31/2017 CLINICAL DATA:  42 year old female with chest and back pain onset this morning after waking. Inspiratory symptoms. Shortness of breath. Prior pulmonary embolus in 2017. EXAM: CT ANGIOGRAPHY CHEST WITH CONTRAST TECHNIQUE: Multidetector CT imaging of the chest was performed using the standard protocol during bolus administration of intravenous contrast. Multiplanar CT image reconstructions and MIPs were obtained to evaluate the vascular anatomy. CONTRAST:  137m ISOVUE-370 IOPAMIDOL (ISOVUE-370) INJECTION 76% COMPARISON:  Chest CTA 03/11/2015 and earlier. FINDINGS: Cardiovascular: Adequate contrast bolus timing in the pulmonary arterial tree. No focal filling defect identified in the pulmonary arteries to suggest acute pulmonary embolism. No calcified coronary artery atherosclerosis is evident. Negative visible aorta. Stable cardiac size at the upper limits of normal. No cardiomegaly. Mediastinum/Nodes: Negative. No lymphadenopathy. Lungs/Pleura: The major airways are patent. Mildly lower lung volumes compared to a 2015 CTA. Mild mosaic attenuation in the lower lobes is chronic and thought to reflect gas trapping. No pleural effusion or other abnormal pulmonary opacity. Upper Abdomen: Negative visible liver, spleen, pancreas, adrenal glands, kidneys, and bowel in the upper abdomen. Musculoskeletal: No acute osseous abnormality. 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 Review of the MIP images confirms the above findings. IMPRESSION: No evidence of acute pulmonary embolus and no acute findings in the chest. Electronically Signed   By: HGenevie AnnM.D.   On: 08/31/2017 16:21   ____________________________________________    PROCEDURES  Procedure(s) performed: None  Procedures  Critical Care  performed: None  ____________________________________________   INITIAL IMPRESSION / ASSESSMENT AND PLAN / ED COURSE  Pertinent labs & imaging results that were available during my care of the patient were reviewed by me and considered in my medical decision making (see chart for details).  She with very reproducible chest and back pain which is worse when she changes position or moves the wrong way we will give we gave her a nonsteroidal pain medication she feels much better at this time able to move with no difficulty.  Her abdomen is completely benign serial exams at this time there is nothing to suggest referred abdominal pain or AAA for this very repeatable chest and upper back pain however given her history of PEs, we did do a CT scan which was reassuringly negative.  EKG is  reassuring, we will repeat patient's troponin if that is negative as well as my hope that we can get her safely home. ----------------------------------------- 5:05 PM on 08/31/2017 -----------------------------------------    Patient is eating potato chips at this time in no acute distress.  She is reassured by her findings and she does not wish to be admitted after discussion  ----------------------------------------- 5:57 PM on 08/31/2017 -----------------------------------------  Awaiting 2nd troponin. If neg will d.c.   ----------------------------------------- 6:27 PM on 08/31/2017 -----------------------------------------  At this time, there does not appear to be clinical evidence to support the diagnosis of pulmonary embolus, dissection, myocarditis, endocarditis, pericarditis, pericardial tamponade, acute coronary syndrome, pneumothorax, pneumonia, or any other acute intrathoracic pathology that will require admission or acute intervention. Nor is there evidence of any significant intra-abdominal pathology causing this discomfort. Pt declines admission after discussion.     ____________________________________________   FINAL CLINICAL IMPRESSION(S) / ED DIAGNOSES  Final diagnoses:  None      This chart was dictated using voice recognition software.  Despite best efforts to proofread,  errors can occur which can change meaning.      Schuyler Amor, MD 08/31/17 1705    Schuyler Amor, MD 08/31/17 1757    Schuyler Amor, MD 08/31/17 [place]    Schuyler Amor, MD 08/31/17 [phone]

## 2017-08-31 NOTE — Discharge Instructions (Signed)
You would prefer not to be admitted to the hospital, obviously this is your choice & at this time, your work-up is reassuring.  However, return to the emergency department for any new or worrisome symptoms including pain, shortness of breath, nausea or vomiting or any change in your condition.  This would include abdominal pain, fever chills, shortness of breath or anything of concern.  Follow closely with primary care doctor and referral cardiologist.

## 2017-08-31 NOTE — ED Triage Notes (Signed)
Pt to ED from home c/o back and chest pain that started this morning after waking up.  Patient states pain to mid back as deep dull pain radiating to mid chest, states some SOB with exertion.  Prior hx of bloodclots in 2016 and states feels similar to how that started.

## 2017-10-24 ENCOUNTER — Emergency Department: Payer: Self-pay

## 2017-10-24 ENCOUNTER — Encounter: Payer: Self-pay | Admitting: Emergency Medicine

## 2017-10-24 ENCOUNTER — Emergency Department
Admission: EM | Admit: 2017-10-24 | Discharge: 2017-10-24 | Disposition: A | Discharge status: Home / Self Care | Payer: Self-pay | Attending: Emergency Medicine | Admitting: Emergency Medicine

## 2017-10-24 ENCOUNTER — Other Ambulatory Visit: Payer: Self-pay

## 2017-10-24 DIAGNOSIS — Z86711 Personal history of pulmonary embolism: Secondary | ICD-10-CM | POA: Insufficient documentation

## 2017-10-24 DIAGNOSIS — R0602 Shortness of breath: Secondary | ICD-10-CM | POA: Insufficient documentation

## 2017-10-24 DIAGNOSIS — Z79899 Other long term (current) drug therapy: Secondary | ICD-10-CM | POA: Insufficient documentation

## 2017-10-24 DIAGNOSIS — I10 Essential (primary) hypertension: Secondary | ICD-10-CM | POA: Insufficient documentation

## 2017-10-24 LAB — BASIC METABOLIC PANEL WITH GFR
Anion gap: 9 (ref 5–15)
BUN: 8 mg/dL (ref 6–20)
CO2: 23 mmol/L (ref 22–32)
Calcium: 8.9 mg/dL (ref 8.9–10.3)
Chloride: 104 mmol/L (ref 98–111)
Creatinine, Ser: 0.86 mg/dL (ref 0.44–1.00)
GFR calc Af Amer: >60 mL/min (ref >60)
GFR calc non Af Amer: >60 mL/min (ref >60)
Glucose, Bld: 112 mg/dL — ABNORMAL HIGH (ref 70–99)
Potassium: 3.3 mmol/L — ABNORMAL LOW (ref 3.5–5.1)
Sodium: 136 mmol/L (ref 135–145)

## 2017-10-24 LAB — CBC
HCT: 26.6 % — ABNORMAL LOW (ref 35.0–47.0)
Hemoglobin: 8.8 g/dL — ABNORMAL LOW (ref 12.0–16.0)
MCH: 18.9 pg — ABNORMAL LOW (ref 26.0–34.0)
MCHC: 33.2 g/dL (ref 32.0–36.0)
MCV: 57 fL — ABNORMAL LOW (ref 80.0–100.0)
Platelets: 498 10*3/uL — ABNORMAL HIGH (ref 150–440)
RBC: 4.66 MIL/uL (ref 3.80–5.20)
RDW: 20.2 % — ABNORMAL HIGH (ref 11.5–14.5)
WBC: 6.9 10*3/uL (ref 3.6–11.0)

## 2017-10-24 LAB — TROPONIN I
Troponin I: <0.03 ng/mL (ref <0.03)
Troponin I: <0.03 ng/mL (ref <0.03)

## 2017-10-24 LAB — POCT PREGNANCY, URINE: Preg Test, Ur: NEGATIVE

## 2017-10-24 MED ORDER — IOHEXOL 350 MG/ML SOLN
75.0000 mL | Freq: Once | INTRAVENOUS | Status: AC | PRN
  Administered 2017-10-24: 75 mL via INTRAVENOUS

## 2017-10-24 NOTE — ED Provider Notes (Signed)
[facility] Emergency Department Provider Note  ____________________________________________  Time seen: Approximately 2:13 PM  I have reviewed the triage vital signs and the nursing notes.   HISTORY  Chief Complaint Shortness of Breath   HPI Jean Morris is a 42 y.o. female with a history of pulmonary embolism in the setting of high hormonal therapy currently not on anticoagulation, hypertension and anemia who presents for evaluation of shortness of breath.  Patient reports that she was driving her daughter when she developed sudden onset of severe shortness of breath.  She reports that her symptoms lasted about 45 minutes and resolved.  She denies any chest pain or dizziness.  She denies any shortness of breath at this time.  Patient reports one prior history of pulmonary embolism in 2017 when she was placed on high-dose hormones for abnormal uterine bleeding.  She currently has a NuvaRing.  She denies any recent travel immobilization, hemoptysis, leg pain or swelling.   Past Medical History:  Diagnosis Date  . Anemia   . Hypertension   . Menorrhagia   . Pulmonary embolism [facility])     Patient Active Problem List   Diagnosis Date Noted  . Morbid obesity with BMI of 60.0-69.9, adult ([place]) 04/18/2015  . Abnormal uterine bleeding 04/18/2015  . Anemia 04/18/2015  . Pulmonary embolism ([place]) 03/11/2015    Past Surgical History:  Procedure Laterality Date  . [place]    Prior to Admission medications   Medication Sig Start Date End Date Taking? Authorizing Provider  levonorgestrel (MIRENA) 20 MCG/24HR IUD 1 Intra Uterine Device (1 each total) by Intrauterine route once. 04/18/15  Yes Rubie Maid, MD  acetaminophen (TYLENOL 8 HOUR) 650 MG CR tablet Take 1 tablet (650 mg total) by mouth every 8 (eight) hours as needed for pain. 04/18/15   Rubie Maid, MD  ferrous sulfate 325 (65 FE) MG tablet Take 1 tablet (325 mg total) by mouth 2 (two)  times daily with a meal. Patient taking differently: Take 325 mg by mouth 3 (three) times daily with meals.  05/28/16 08/31/17  Tawni Millers, MD  ondansetron (ZOFRAN ODT) 4 MG disintegrating tablet Take 1 tablet (4 mg total) by mouth every 8 (eight) hours as needed for nausea or vomiting. Patient not taking: Reported on 08/31/2017 10/30/16   Earleen Newport, MD  ondansetron (ZOFRAN) 4 MG tablet Take 1 tablet (4 mg total) by mouth every 8 (eight) hours as needed for nausea or vomiting. Patient not taking: Reported on 08/31/2017 03/11/17 03/11/18  Eula Listen, MD  oxyCODONE-acetaminophen (PERCOCET) 5-325 MG tablet Take 1-2 tablets by mouth every 8 (eight) hours as needed. Patient not taking: Reported on 11/05/2016 10/30/16   Earleen Newport, MD    Allergies Pollen extract  Family History  Problem Relation Age of Onset  . Hypertension Mother   . Stroke Mother   . Heart attack Mother   . Hypertension Father   . Hypertension Other     Social History Social History   Tobacco Use  . Smoking status: Never Smoker  . Smokeless tobacco: Never Used  Substance Use Topics  . Alcohol use: No  . Drug use: No    Review of Systems  Constitutional: Negative for fever. Eyes: Negative for visual changes. ENT: Negative for sore throat. Neck: No neck pain  Cardiovascular: Negative for chest pain. Respiratory: + shortness of breath. Gastrointestinal: Negative for abdominal pain, vomiting or diarrhea. Genitourinary: Negative for dysuria. Musculoskeletal: Negative for back  pain. Skin: Negative for rash. Neurological: Negative for headaches, weakness or numbness. Psych: No SI or HI  ____________________________________________   PHYSICAL EXAM:  VITAL SIGNS: ED Triage Vitals  Enc Vitals Group     BP 10/24/17 0925 (!) 152/81     Pulse Rate 10/24/17 0925 (!) 101     Resp 10/24/17 0925 18     Temp 10/24/17 0925 98.4 F (36.9 C)     Temp Source 10/24/17 0925 Oral     SpO2  10/24/17 0925 100 %     Weight 10/24/17 0923 (!) 378 lb (171.5 kg)     Height 10/24/17 0923 5' 5"  (1.651 m)     Head Circumference --      Peak Flow --      Pain Score 10/24/17 0922 0     Pain Loc --      Pain Edu? --      Excl. in [place]? --     Constitutional: Alert and oriented. Well appearing and in no apparent distress. HEENT:      Head: Normocephalic and atraumatic.         Eyes: Conjunctivae are normal. Sclera is non-icteric.       Mouth/Throat: Mucous membranes are moist.       Neck: Supple with no signs of meningismus. Cardiovascular: Tachycardic with regular rhythm. No murmurs, gallops, or rubs. 2+ symmetrical distal pulses are present in all extremities. No JVD. Respiratory: Normal respiratory effort. Lungs are clear to auscultation bilaterally. No wheezes, crackles, or rhonchi.  Gastrointestinal: Soft, non tender, and non distended with positive bowel sounds. No rebound or guarding. Musculoskeletal: Nontender with normal range of motion in all extremities. No edema, cyanosis, or erythema of extremities. Neurologic: Normal speech and language. Face is symmetric. Moving all extremities. No gross focal neurologic deficits are appreciated. Skin: Skin is warm, dry and intact. No rash noted. Psychiatric: Mood and affect are normal. Speech and behavior are normal.  ____________________________________________   LABS (all labs ordered are listed, but only abnormal results are displayed)  Labs Reviewed  BASIC METABOLIC PANEL - Abnormal; Notable for the following components:      Result Value   Potassium 3.3 (*)    Glucose, Bld 112 (*)    All other components within normal limits  CBC - Abnormal; Notable for the following components:   Hemoglobin 8.8 (*)    HCT 26.6 (*)    MCV 57.0 (*)    MCH 18.9 (*)    RDW 20.2 (*)    Platelets 498 (*)    All other components within normal limits  TROPONIN I  TROPONIN I  POC URINE PREG, ED  POCT PREGNANCY, URINE    ____________________________________________  EKG  ED ECG REPORT I, Rudene Re, the attending physician, personally viewed and interpreted this ECG.  Sinus tachycardia, rate of 102, normal intervals, normal axis, no ST elevations or depressions. O2U2P5.  Unchanged from prior from 7/19 ____________________________________________  RADIOLOGY  I have personally reviewed the images performed during this visit and I agree with the Radiologist's read.   Interpretation by Radiologist:  Dg Chest 2 View  Result Date: 10/24/2017 CLINICAL DATA:  42 year old female with shortness of breath for the past 30 minutes EXAM: CHEST - 2 VIEW COMPARISON:  Prior chest x-ray and CT scan of the chest 08/31/2017 FINDINGS: The lungs are clear and negative for focal airspace consolidation, pulmonary edema or suspicious pulmonary nodule. No pleural effusion or pneumothorax. Cardiac and mediastinal contours are within normal limits.  No acute fracture or lytic or blastic osseous lesions. The visualized upper abdominal bowel gas pattern is unremarkable. IMPRESSION: Negative chest x-ray. Electronically Signed   By: Jacqulynn Cadet M.D.   On: 10/24/2017 10:16   Ct Angio Chest Pe W And/or Wo Contrast  Result Date: 10/24/2017 CLINICAL DATA:  Shortness of breath today. EXAM: CT ANGIOGRAPHY CHEST WITH CONTRAST TECHNIQUE: Multidetector CT imaging of the chest was performed using the standard protocol during bolus administration of intravenous contrast. Multiplanar CT image reconstructions and MIPs were obtained to evaluate the vascular anatomy. CONTRAST:  75 mL OMNIPAQUE IOHEXOL 350 MG/ML SOLN COMPARISON:  CT chest 08/31/2017. FINDINGS: Cardiovascular: Satisfactory opacification of the pulmonary arteries to the segmental level. No evidence of pulmonary embolism. Heart size is upper normal. No pericardial effusion. Mediastinum/Nodes: No enlarged mediastinal, hilar, or axillary lymph nodes. Thyroid gland, trachea, and  esophagus demonstrate no significant findings. Lungs/Pleura: Lungs are clear. No pleural effusion or pneumothorax. Upper Abdomen: No acute abnormality. Fatty infiltration of the liver noted. Musculoskeletal: No acute abnormality. No lytic or sclerotic lesion. Review of the MIP images confirms the above findings. IMPRESSION: Negative for pulmonary embolus. No acute abnormality or finding to explain the patient's symptoms. Fatty infiltration of the liver. Electronically Signed   By: Inge Rise M.D.   On: 10/24/2017 14:04     ____________________________________________   PROCEDURES  Procedure(s) performed: None Procedures Critical Care performed:  None ____________________________________________   INITIAL IMPRESSION / ASSESSMENT AND PLAN / ED COURSE  42 y.o. female with a history of pulmonary embolism in the setting of high hormonal therapy currently not on anticoagulation, hypertension and anemia who presents for evaluation of sudden onset severe shortness of breath.  Patient high risk for PE with prior history of PE in the past, not anticoagulated, and on hormones.  CTA was done which was negative for PE.  Patient is asymptomatic at this time.  EKG with no evidence of ischemia. Troponin x 1 negative, will get repeat. Imaging with no evidence of pneumonia. If 2nd troponin is negative plan to dc home as symptoms have fully resolve.     _________________________ 3:13 PM on 10/24/2017 -----------------------------------------  2nd troponin is negative. Patient remains asymptomatic. Will dc home with follow-up with primary care doctor.  Discussed return precautions for chest pain or shortness of breath.   As part of my medical decision making, I reviewed the following data within the [place] notes reviewed and incorporated, Labs reviewed , EKG interpreted , Old chart reviewed, Radiograph reviewed , Notes from prior ED visits and [place] Controlled Substance  Database    Pertinent labs & imaging results that were available during my care of the patient were reviewed by me and considered in my medical decision making (see chart for details).    ____________________________________________   FINAL CLINICAL IMPRESSION(S) / ED DIAGNOSES  Final diagnoses:  SOB (shortness of breath)      NEW MEDICATIONS STARTED DURING THIS VISIT:  ED Discharge Orders    None       Note:  This document was prepared using Dragon voice recognition software and may include unintentional dictation errors.    Alfred Levins, [place], MD 10/24/17 [phone]

## 2017-10-24 NOTE — ED Notes (Signed)
Pt taken to CT.

## 2017-10-24 NOTE — ED Notes (Signed)
.   Pt is resting, Respirations even and unlabored, NAD. Stretcher lowest postion and locked. Call bell within reach. Denies any needs at this time RN will continue to monitor.

## 2017-10-24 NOTE — ED Triage Notes (Signed)
SOB x 30 minutes.  States had dropped daughter off at work and then after eating, felt SOB.  Denies CP

## 2017-10-26 IMAGING — US US EXTREM LOW VENOUS BILAT
1 series · 13 of 24 positions shown · non-contrast
Comparison: 04/08/2013.

CLINICAL DATA: Pulmonary embolism.



[Series 1: us extrem low venous bilat · 0.12mm/px · 13 of 67 slices shown]
[im 1/67]
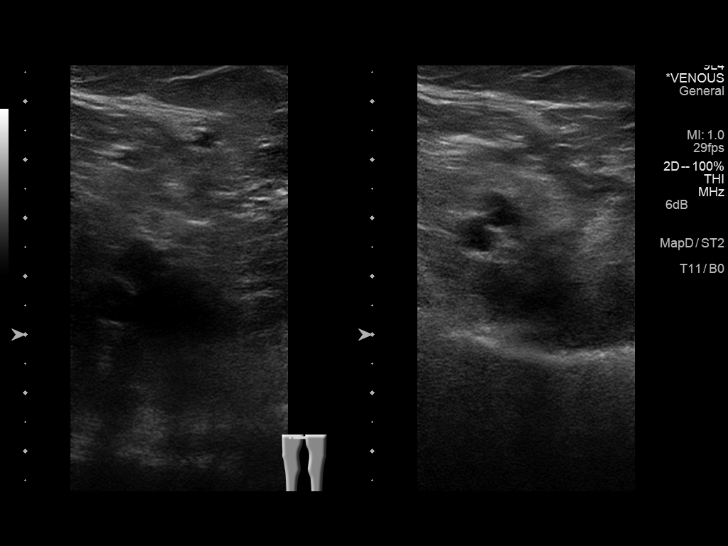
[im 6/67]
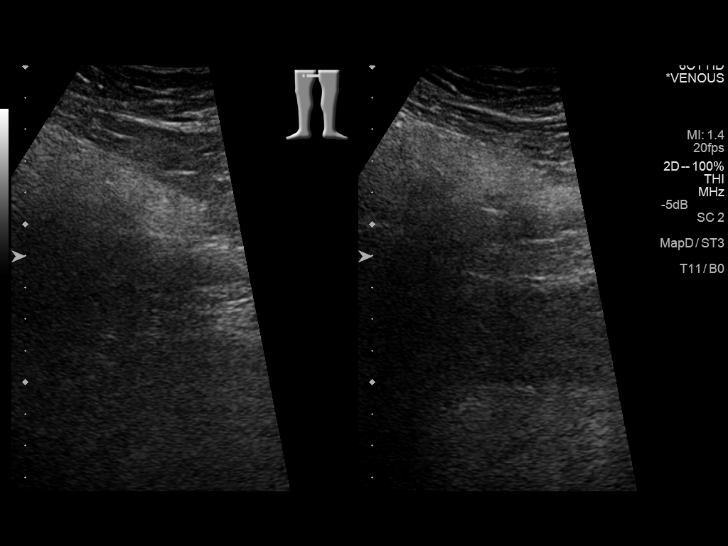
[im 12/67]
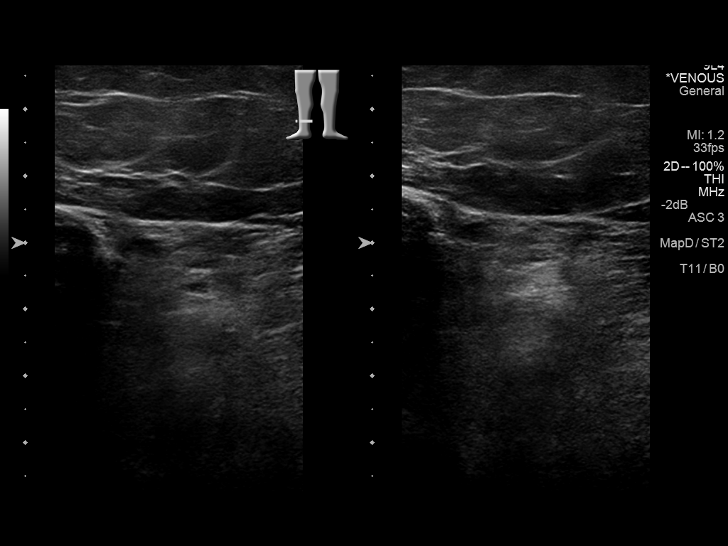
[im 18/67]
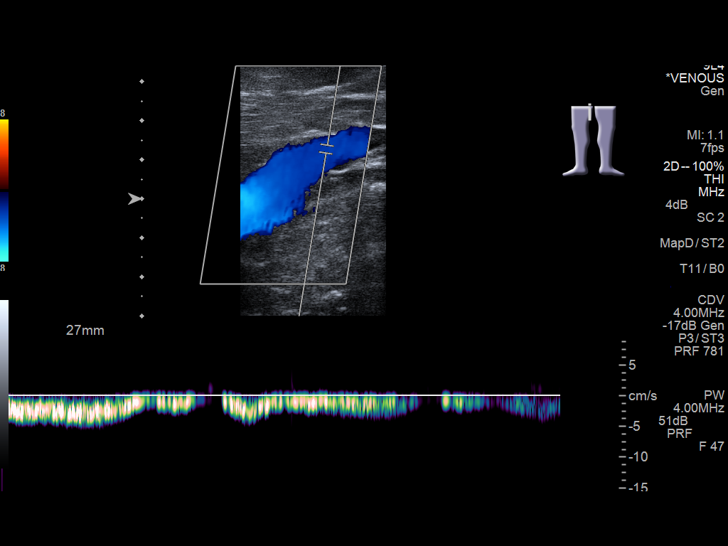
[im 23/67]
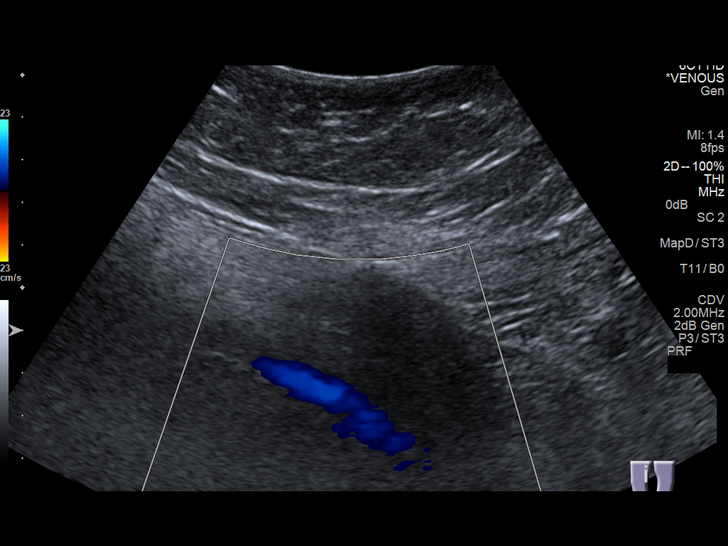
[im 29/67]
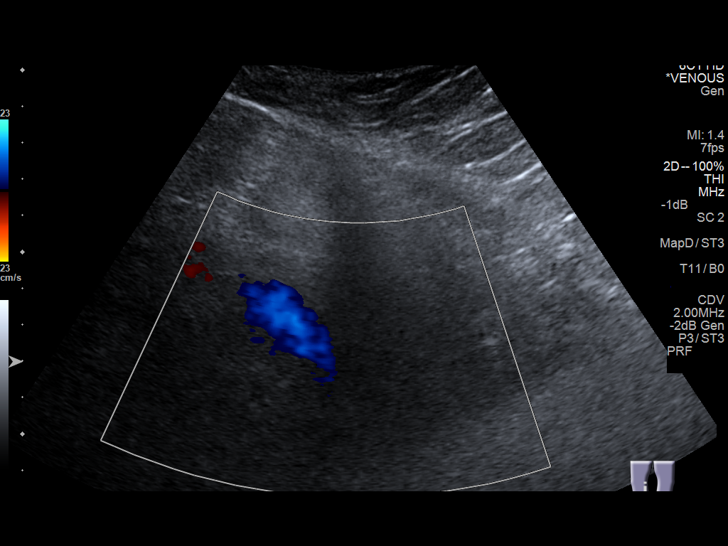
[im 35/67]
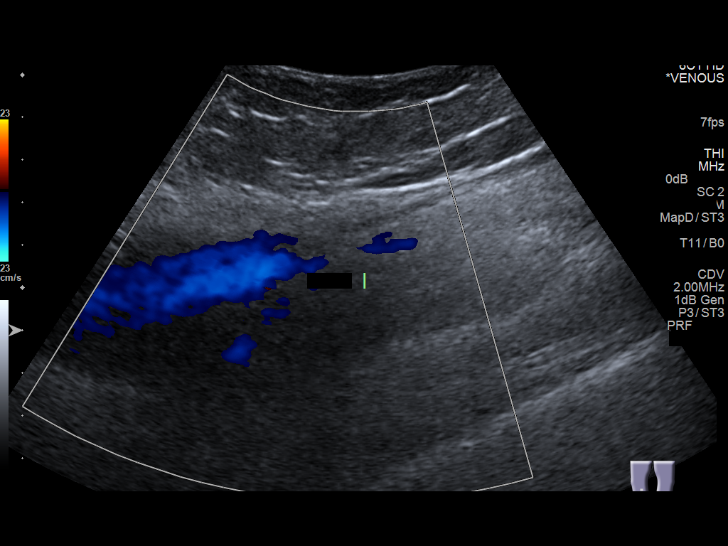
[im 38/67]
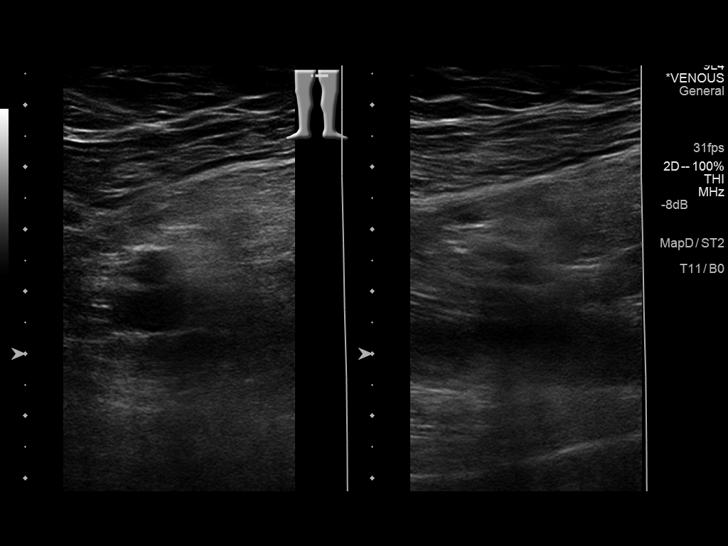
[im 44/67]
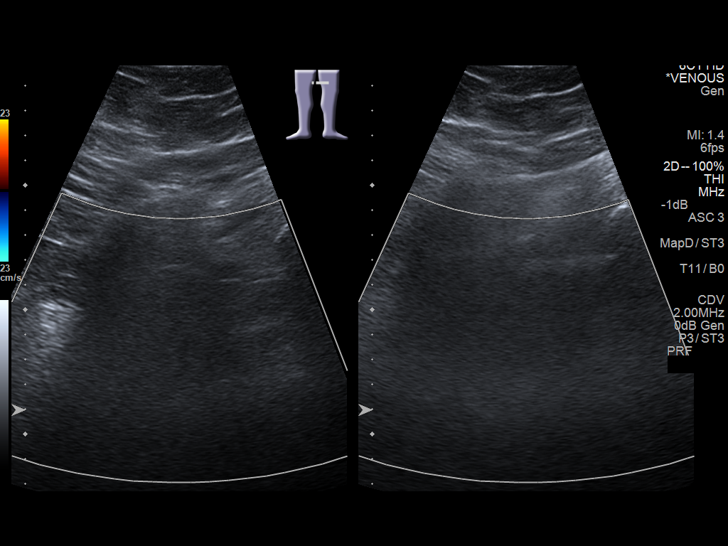
[im 49/67]
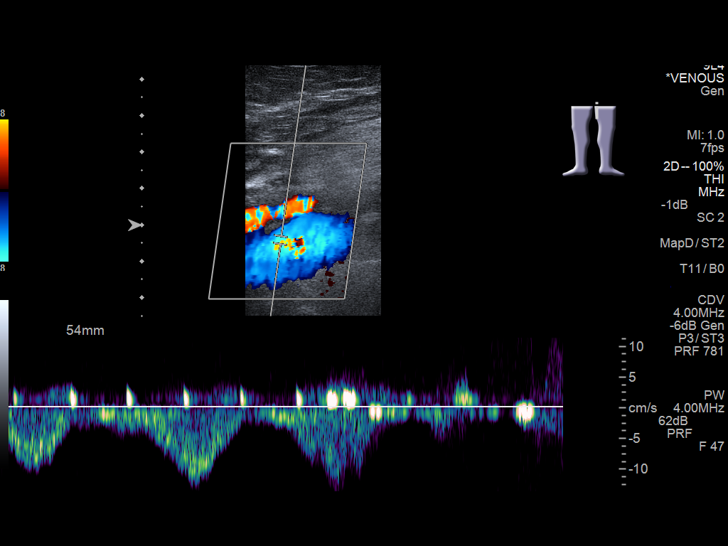
[im 55/67]
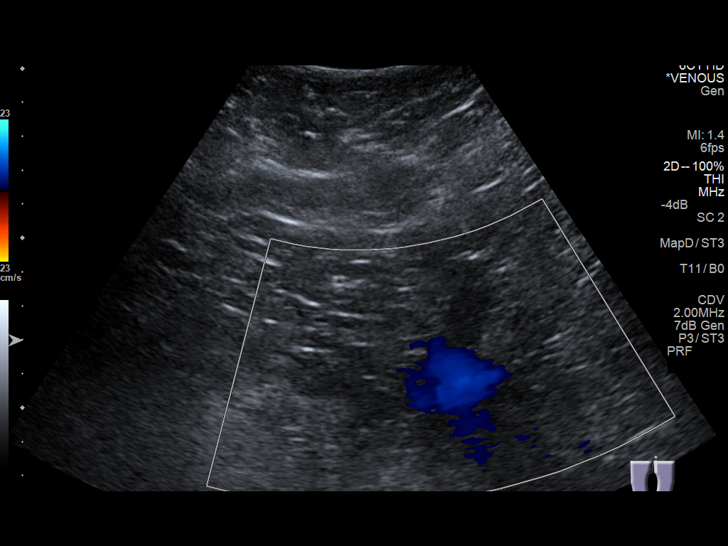
[im 61/67]
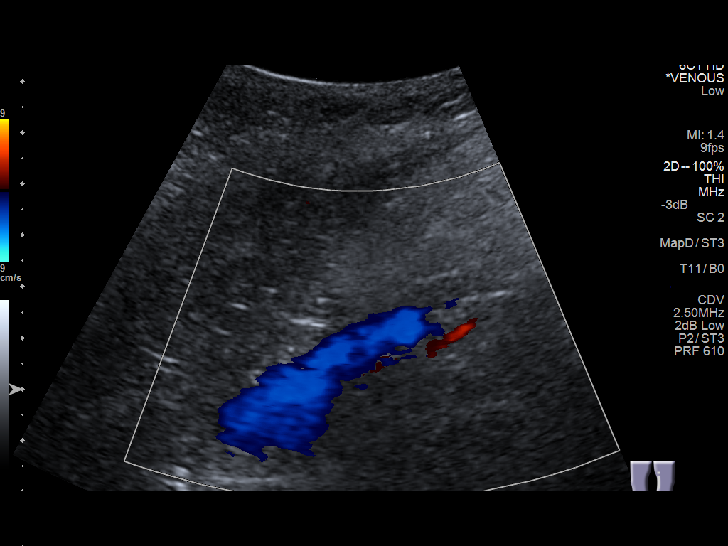
[im 67/67]
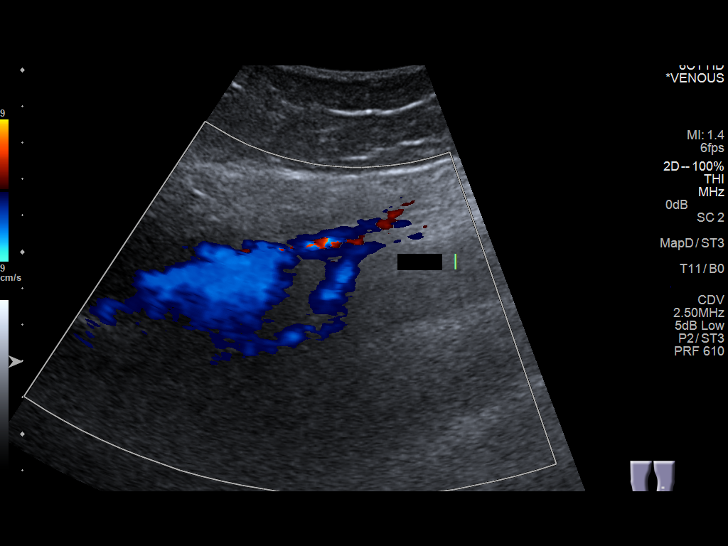

[13 of 24 positions shown; findings below may reference images not displayed]

FINDINGS: RIGHT LOWER EXTREMITY

Common Femoral Vein: No evidence of thrombus. Normal
compressibility, respiratory phasicity and response to augmentation.

Saphenofemoral Junction: No evidence of thrombus. Normal
compressibility and flow on color Doppler imaging.

Profunda Femoral Vein: No evidence of thrombus. Normal
compressibility and flow on color Doppler imaging.

Femoral Vein: No evidence of thrombus. Normal compressibility,
respiratory phasicity and response to augmentation.

Popliteal Vein: No evidence of thrombus. Normal compressibility,
respiratory phasicity and response to augmentation.

Calf Veins: No evidence of thrombus. Normal compressibility and flow
on color Doppler imaging.

Superficial Great Saphenous Vein: No evidence of thrombus. Normal
compressibility and flow on color Doppler imaging.

Other Findings:  Limited exam due to patient's body habitus.

LEFT LOWER EXTREMITY

Common Femoral Vein: No evidence of thrombus. Normal
compressibility, respiratory phasicity and response to augmentation.

Saphenofemoral Junction: No evidence of thrombus. Normal
compressibility and flow on color Doppler imaging.

Profunda Femoral Vein: No evidence of thrombus. Normal
compressibility and flow on color Doppler imaging.

Femoral Vein: No evidence of thrombus. Normal compressibility,
respiratory phasicity and response to augmentation.

Popliteal Vein: No evidence of thrombus. Normal compressibility,
respiratory phasicity and response to augmentation.

Calf Veins: No evidence of thrombus. Normal compressibility and flow
on color Doppler imaging.

Superficial Great Saphenous Vein: No evidence of thrombus. Normal
compressibility and flow on color Doppler imaging.

Other Findings:  Limited exam due to patient's body habitus.
IMPRESSION: No evidence of deep venous thrombosis. Limited exam due to patient's
body habitus.

## 2018-03-03 ENCOUNTER — Encounter: Payer: Self-pay | Admitting: Emergency Medicine

## 2018-03-03 ENCOUNTER — Emergency Department
Admission: EM | Admit: 2018-03-03 | Discharge: 2018-03-03 | Disposition: A | Discharge status: Home / Self Care | Payer: Self-pay | Attending: Emergency Medicine | Admitting: Emergency Medicine

## 2018-03-03 ENCOUNTER — Other Ambulatory Visit: Payer: Self-pay

## 2018-03-03 DIAGNOSIS — I10 Essential (primary) hypertension: Secondary | ICD-10-CM | POA: Insufficient documentation

## 2018-03-03 DIAGNOSIS — N390 Urinary tract infection, site not specified: Secondary | ICD-10-CM | POA: Insufficient documentation

## 2018-03-03 DIAGNOSIS — K047 Periapical abscess without sinus: Secondary | ICD-10-CM | POA: Insufficient documentation

## 2018-03-03 DIAGNOSIS — Z79899 Other long term (current) drug therapy: Secondary | ICD-10-CM | POA: Insufficient documentation

## 2018-03-03 DIAGNOSIS — R319 Hematuria, unspecified: Secondary | ICD-10-CM

## 2018-03-03 LAB — URINALYSIS, COMPLETE (UACMP) WITH MICROSCOPIC
Bilirubin Urine: NEGATIVE
Glucose, UA: NEGATIVE mg/dL
Ketones, ur: NEGATIVE mg/dL
Nitrite: NEGATIVE
Protein, ur: NEGATIVE mg/dL
Specific Gravity, Urine: 1.021 (ref 1.005–1.030)
WBC, UA: >50 WBC/hpf — ABNORMAL HIGH (ref 0–5)
pH: 5 (ref 5.0–8.0)

## 2018-03-03 MED ORDER — CEFDINIR 300 MG PO CAPS: 300.0000 mg | ORAL_CAPSULE | Freq: Two times a day (BID) | ORAL | 0 refills | Status: DC

## 2018-03-03 MED ORDER — TRAMADOL HCL 50 MG PO TABS: 50.0000 mg | ORAL_TABLET | Freq: Four times a day (QID) | ORAL | 0 refills | Status: DC | PRN

## 2018-03-03 NOTE — Discharge Instructions (Signed)
Follow-up with your regular doctor if not better in 3 days.  Return emergency department worsening.  I have added a urine culture to the urine to make sure you are on the correct antibiotic for infection.  Follow-up with the dentist for the infection in your mouth.

## 2018-03-03 NOTE — ED Provider Notes (Signed)
[facility] Emergency Department Provider Note  ____________________________________________   First MD Initiated Contact with Patient 03/03/18 1650     (approximate)  I have reviewed the triage vital signs and the nursing notes.   HISTORY  Chief Complaint Generalized Body Aches    HPI Jean Morris is a 43 y.o. female presents emergency department complaining of sinus pain and pressure, headache, nasal drainage, and possible dental abscess at the front tooth.  She is also complaining of burning with urination.  She denies fever chills this time.  She denies chest pain or shortness of breath.  She denies abdominal pain.    Past Medical History:  Diagnosis Date  . Anemia   . Hypertension   . Menorrhagia   . Pulmonary embolism [facility])     Patient Active Problem List   Diagnosis Date Noted  . Morbid obesity with BMI of 60.0-69.9, adult ([place]) 04/18/2015  . Abnormal uterine bleeding 04/18/2015  . Anemia 04/18/2015  . Pulmonary embolism ([place]) 03/11/2015    Past Surgical History:  Procedure Laterality Date  . [place]    Prior to Admission medications   Medication Sig Start Date End Date Taking? Authorizing Provider  acetaminophen (TYLENOL 8 HOUR) 650 MG CR tablet Take 1 tablet (650 mg total) by mouth every 8 (eight) hours as needed for pain. 04/18/15   Rubie Maid, MD  cefdinir (OMNICEF) 300 MG capsule Take 1 capsule (300 mg total) by mouth 2 (two) times daily. 03/03/18   Fisher, Linden Dolin, PA-C  ferrous sulfate 325 (65 FE) MG tablet Take 1 tablet (325 mg total) by mouth 2 (two) times daily with a meal. Patient taking differently: Take 325 mg by mouth 3 (three) times daily with meals.  05/28/16 08/31/17  Tawni Millers, MD  levonorgestrel (MIRENA) 20 MCG/24HR IUD 1 Intra Uterine Device (1 each total) by Intrauterine route once. 04/18/15   Rubie Maid, MD  traMADol (ULTRAM) 50 MG tablet Take 1 tablet (50 mg total) by mouth every 6 (six) hours  as needed. 03/03/18   Versie Starks, PA-C    Allergies Pollen extract  Family History  Problem Relation Age of Onset  . Hypertension Mother   . Stroke Mother   . Heart attack Mother   . Hypertension Father   . Hypertension Other     Social History Social History   Tobacco Use  . Smoking status: Never Smoker  . Smokeless tobacco: Never Used  Substance Use Topics  . Alcohol use: No  . Drug use: No    Review of Systems  Constitutional: No fever/chills Eyes: No visual changes. ENT: No sore throat.  Positive for dental pain and sinus pain Respiratory: Denies cough Genitourinary: Positive for dysuria. Musculoskeletal: Negative for back pain. Skin: Negative for rash.    ____________________________________________   PHYSICAL EXAM:  VITAL SIGNS: ED Triage Vitals [03/03/18 1619]  Enc Vitals Group     BP 133/76     Pulse Rate 96     Resp 18     Temp 98.5 F (36.9 C)     Temp Source Oral     SpO2 100 %     Weight      Height      Head Circumference      Peak Flow      Pain Score 7     Pain Loc      Pain Edu?      Excl. in [place]?  Constitutional: Alert and oriented. Well appearing and in no acute distress. Eyes: Conjunctivae are normal.  Head: Atraumatic. Nose: No congestion/rhinnorhea. Mouth/Throat: Mucous membranes are moist.  Multiple dental caries and poor dentition are noted Neck:  supple no lymphadenopathy noted Cardiovascular: Normal rate, regular rhythm. Heart sounds are normal Respiratory: Normal respiratory effort.  No retractions, lungs c t a  Abd: soft nontender bs normal all 4 quad, no CVA tenderness GU: deferred Musculoskeletal: FROM all extremities, warm and well perfused Neurologic:  Normal speech and language.  Skin:  Skin is warm, dry and intact. No rash noted. Psychiatric: Mood and affect are normal. Speech and behavior are normal.  ____________________________________________   LABS (all labs ordered are listed, but only  abnormal results are displayed)  Labs Reviewed  URINALYSIS, COMPLETE (UACMP) WITH MICROSCOPIC - Abnormal; Notable for the following components:      Result Value   Color, Urine YELLOW (*)    APPearance CLOUDY (*)    Hgb urine dipstick MODERATE (*)    Leukocytes, UA MODERATE (*)    WBC, UA >50 (*)    Bacteria, UA MANY (*)    All other components within normal limits  URINE CULTURE   ____________________________________________   ____________________________________________  RADIOLOGY    ____________________________________________   PROCEDURES  Procedure(s) performed: No  Procedures    ____________________________________________   INITIAL IMPRESSION / ASSESSMENT AND PLAN / ED COURSE  Pertinent labs & imaging results that were available during my care of the patient were reviewed by me and considered in my medical decision making (see chart for details).   Patient is 43 year old female presents emergency department complaining of a questionable dental abscess, sinus pain, and UTI symptoms.  Physical exam shows a nontoxic afebrile patient.  Vitals are within normal limits.  Poor dentition with possible abscess at the front tooth.  No CVA tenderness or abdominal tenderness.  Manual exam is unremarkable.   UA shows moderate hemoglobin, moderate leuks, gram 50 WBCs and many bacteria.  Patient is symptomatic so she will placed on Omnicef which should treat both the UTI and a dental abscess.  She was instructed to follow-up with her regular doctor for the UTI if not better in 3 to 4 days and follow-up with a dentist for the dental abscess.  She states she understands will comply.  A urine culture was added to make sure she is on the correct antibiotic.  She was discharged in stable condition.    As part of my medical decision making, I reviewed the following data within the [place] notes reviewed and incorporated, Labs reviewed UA positive for  infection, Notes from prior ED visits and [place] Controlled Substance Database  ____________________________________________   FINAL CLINICAL IMPRESSION(S) / ED DIAGNOSES  Final diagnoses:  Urinary tract infection with hematuria, site unspecified  Dental abscess      NEW MEDICATIONS STARTED DURING THIS VISIT:  New Prescriptions   CEFDINIR (OMNICEF) 300 MG CAPSULE    Take 1 capsule (300 mg total) by mouth 2 (two) times daily.   TRAMADOL (ULTRAM) 50 MG TABLET    Take 1 tablet (50 mg total) by mouth every 6 (six) hours as needed.     Note:  This document was prepared using Dragon voice recognition software and may include unintentional dictation errors.    Versie Starks, PA-C 03/03/18 Mirian Capuchin    Nance Pear, MD 03/03/18 [phone]

## 2018-03-03 NOTE — ED Notes (Signed)
Pt is being discharged to home. Pt is AOx4, VSS, she does not have any signs of distress or c/o any pain at this time. AVS was given and explained to the pt and she verbalized understanding of all information.

## 2018-03-03 NOTE — ED Triage Notes (Signed)
PT c/o sinus pressure headache, nasal drainage, body aches. Denies fever. PT states she has taken multiple OTC meds with no relief. VSS. Denies any CP or SOB.

## 2018-03-06 LAB — URINE CULTURE: Culture: >=100000 — AB

## 2018-12-11 ENCOUNTER — Emergency Department
Admission: EM | Admit: 2018-12-11 | Discharge: 2018-12-11 | Disposition: A | Discharge status: Home / Self Care | Payer: BC Managed Care – PPO | Attending: Emergency Medicine | Admitting: Emergency Medicine

## 2018-12-11 ENCOUNTER — Encounter: Payer: Self-pay | Admitting: Emergency Medicine

## 2018-12-11 ENCOUNTER — Other Ambulatory Visit: Payer: Self-pay

## 2018-12-11 ENCOUNTER — Emergency Department: Payer: BC Managed Care – PPO

## 2018-12-11 DIAGNOSIS — N390 Urinary tract infection, site not specified: Secondary | ICD-10-CM | POA: Insufficient documentation

## 2018-12-11 DIAGNOSIS — M5416 Radiculopathy, lumbar region: Secondary | ICD-10-CM | POA: Insufficient documentation

## 2018-12-11 DIAGNOSIS — Z79899 Other long term (current) drug therapy: Secondary | ICD-10-CM | POA: Diagnosis not present

## 2018-12-11 DIAGNOSIS — I10 Essential (primary) hypertension: Secondary | ICD-10-CM | POA: Insufficient documentation

## 2018-12-11 DIAGNOSIS — M5410 Radiculopathy, site unspecified: Secondary | ICD-10-CM

## 2018-12-11 DIAGNOSIS — M545 Low back pain: Secondary | ICD-10-CM | POA: Diagnosis present

## 2018-12-11 LAB — URINALYSIS, COMPLETE (UACMP) WITH MICROSCOPIC
Bilirubin Urine: NEGATIVE
Glucose, UA: NEGATIVE mg/dL
Ketones, ur: NEGATIVE mg/dL
Nitrite: NEGATIVE
Protein, ur: NEGATIVE mg/dL
RBC / HPF: >50 RBC/hpf — ABNORMAL HIGH (ref 0–5)
Specific Gravity, Urine: 1.012 (ref 1.005–1.030)
pH: 6 (ref 5.0–8.0)

## 2018-12-11 LAB — POCT PREGNANCY, URINE: Preg Test, Ur: NEGATIVE

## 2018-12-11 MED ORDER — LIDOCAINE 5 % EX PTCH
1.0000 | MEDICATED_PATCH | CUTANEOUS | Status: DC
  Administered 2018-12-11: 10:00:00 1 via TRANSDERMAL
  Filled 2018-12-11: qty 1

## 2018-12-11 MED ORDER — SULFAMETHOXAZOLE-TRIMETHOPRIM 800-160 MG PO TABS: 1.0000 | ORAL_TABLET | Freq: Two times a day (BID) | ORAL | 0 refills | Status: DC

## 2018-12-11 MED ORDER — PHENAZOPYRIDINE HCL 200 MG PO TABS: 200.0000 mg | ORAL_TABLET | Freq: Three times a day (TID) | ORAL | 0 refills | Status: DC | PRN

## 2018-12-11 MED ORDER — PREDNISONE 20 MG PO TABS
60.0000 mg | ORAL_TABLET | Freq: Once | ORAL | Status: AC
  Administered 2018-12-11: 60 mg via ORAL
  Filled 2018-12-11: qty 3

## 2018-12-11 MED ORDER — TRAMADOL HCL 50 MG PO TABS: 50.0000 mg | ORAL_TABLET | Freq: Four times a day (QID) | ORAL | 0 refills | Status: DC | PRN

## 2018-12-11 MED ORDER — CYCLOBENZAPRINE HCL 10 MG PO TABS: 10.0000 mg | ORAL_TABLET | Freq: Three times a day (TID) | ORAL | 0 refills | Status: DC | PRN

## 2018-12-11 MED ORDER — METHYLPREDNISOLONE 4 MG PO TBPK: ORAL_TABLET | ORAL | 0 refills | Status: DC

## 2018-12-11 NOTE — ED Triage Notes (Signed)
Pt to ED via POV c/o lower back and left leg pain x 4 days. Pt ambulatory without difficulty. Pt is in NAD

## 2018-12-11 NOTE — ED Notes (Signed)
See triage note  Presents with right lower back pain which is moving into right leg  Also has had some dysuria

## 2018-12-11 NOTE — ED Provider Notes (Signed)
[facility] Emergency Department Provider Note   ____________________________________________   First MD Initiated Contact with Patient 12/11/18 [phone]     (approximate)  I have reviewed the triage vital signs and the nursing notes.   HISTORY  Chief Complaint Back Pain and Leg Pain    HPI Jean Morris is a 43 y.o. female patient complains of radicular back pain to the left lower extremity 4 days.  Patient denies bladder or bowel dysfunction.  Patient also states dysuria.  Denies fever or vaginal discharge.  Patient similar complaint 2 weeks ago which resolved without intervention.  Patient rates the pain as a 10/10.  Patient describes the pain as "achy".  No palliative measure for complaint.         Past Medical History:  Diagnosis Date  . Anemia   . Hypertension   . Menorrhagia   . Pulmonary embolism [facility])     Patient Active Problem List   Diagnosis Date Noted  . Morbid obesity with BMI of 60.0-69.9, adult ([place]) 04/18/2015  . Abnormal uterine bleeding 04/18/2015  . Anemia 04/18/2015  . Pulmonary embolism ([place]) 03/11/2015    Past Surgical History:  Procedure Laterality Date  . [place]    Prior to Admission medications   Medication Sig Start Date End Date Taking? Authorizing Provider  acetaminophen (TYLENOL 8 HOUR) 650 MG CR tablet Take 1 tablet (650 mg total) by mouth every 8 (eight) hours as needed for pain. 04/18/15   Rubie Maid, MD  cefdinir (OMNICEF) 300 MG capsule Take 1 capsule (300 mg total) by mouth 2 (two) times daily. 03/03/18   Fisher, Linden Dolin, PA-C  cyclobenzaprine (FLEXERIL) 10 MG tablet Take 1 tablet (10 mg total) by mouth 3 (three) times daily as needed. 12/11/18   Sable Feil, PA-C  ferrous sulfate 325 (65 FE) MG tablet Take 1 tablet (325 mg total) by mouth 2 (two) times daily with a meal. Patient taking differently: Take 325 mg by mouth 3 (three) times daily with meals.  05/28/16 08/31/17  Tawni Millers, MD   levonorgestrel (MIRENA) 20 MCG/24HR IUD 1 Intra Uterine Device (1 each total) by Intrauterine route once. 04/18/15   Rubie Maid, MD  methylPREDNISolone (MEDROL DOSEPAK) 4 MG TBPK tablet Take Tapered dose as directed 12/11/18   Sable Feil, PA-C  phenazopyridine (PYRIDIUM) 200 MG tablet Take 1 tablet (200 mg total) by mouth 3 (three) times daily as needed for pain. 12/11/18   Sable Feil, PA-C  sulfamethoxazole-trimethoprim (BACTRIM DS) 800-160 MG tablet Take 1 tablet by mouth 2 (two) times daily. 12/11/18   Sable Feil, PA-C  traMADol (ULTRAM) 50 MG tablet Take 1 tablet (50 mg total) by mouth every 6 (six) hours as needed. 12/11/18 12/11/19  Sable Feil, PA-C    Allergies Pollen extract  Family History  Problem Relation Age of Onset  . Hypertension Mother   . Stroke Mother   . Heart attack Mother   . Hypertension Father   . Hypertension Other     Social History Social History   Tobacco Use  . Smoking status: Never Smoker  . Smokeless tobacco: Never Used  Substance Use Topics  . Alcohol use: No  . Drug use: No    Review of Systems Constitutional: No fever/chills Eyes: No visual changes. ENT: No sore throat. Cardiovascular: Denies chest pain. Respiratory: Denies shortness of breath. Gastrointestinal: No abdominal pain.  No nausea, no vomiting.  No diarrhea.  No constipation.  Genitourinary: Positive for dysuria. Musculoskeletal: Positive for back pain. Skin: Negative for rash. Neurological: Negative for headaches, focal weakness or numbness. Allergic/Immunilogical: Pollen ____________________________________________   PHYSICAL EXAM:  VITAL SIGNS: ED Triage Vitals  Enc Vitals Group     BP 12/11/18 0833 137/87     Pulse Rate 12/11/18 0833 (!) 101     Resp 12/11/18 0833 16     Temp 12/11/18 0833 98 F (36.7 C)     Temp Source 12/11/18 0833 Oral     SpO2 12/11/18 0833 96 %     Weight 12/11/18 0831 (!) 332 lb (150.6 kg)     Height 12/11/18 0831 5'  5" (1.651 m)     Head Circumference --      Peak Flow --      Pain Score 12/11/18 0831 10     Pain Loc --      Pain Edu? --      Excl. in [place]? --     Constitutional: Alert and oriented. Well appearing and in no acute distress.  Morbid obesity. Cardiovascular: Normal rate, regular rhythm. Grossly normal heart sounds.  Good peripheral circulation. Respiratory: Normal respiratory effort.  No retractions. Lungs CTAB. Gastrointestinal: Distention secondary to obesity.  Normoactive bowel sounds.  Soft and nontender. No distention. No abdominal bruits. No CVA tenderness. Genitourinary: Deferred Musculoskeletal: Patient body habitus limits the exam of the lumbar spine.  No obvious deformity.  Patient is moderate guarding palpation L4-S1.  Patient has negative straight leg test in the supine position. o lower extremity tenderness nor edema.  No joint effusions. Neurologic:  Normal speech and language. No gross focal neurologic deficits are appreciated. No gait instability. Skin:  Skin is warm, dry and intact. No rash noted. Psychiatric: Mood and affect are normal. Speech and behavior are normal.  ____________________________________________   LABS (all labs ordered are listed, but only abnormal results are displayed)  Labs Reviewed  URINALYSIS, COMPLETE (UACMP) WITH MICROSCOPIC - Abnormal; Notable for the following components:      Result Value   Color, Urine YELLOW (*)    APPearance HAZY (*)    Hgb urine dipstick LARGE (*)    Leukocytes,Ua TRACE (*)    RBC / HPF >50 (*)    Bacteria, UA RARE (*)    All other components within normal limits  POC URINE PREG, ED  POCT PREGNANCY, URINE   ____________________________________________  EKG   ____________________________________________  RADIOLOGY  ED MD interpretation:    Official radiology report(s): Dg Lumbar Spine Complete  Result Date: 12/11/2018 CLINICAL DATA:  Low back pain radiating to left leg x5 days EXAM: LUMBAR SPINE -  COMPLETE 4+ VIEW COMPARISON:  None. FINDINGS: Five lumbar-type vertebral bodies. Normal lumbar lordosis. No evidence of fracture or dislocation. Vertebral body heights and intervertebral disc spaces are maintained. Visualized bony pelvis appears intact. IMPRESSION: Negative. Electronically Signed   By: Julian Hy M.D.   On: 12/11/2018 10:09    ____________________________________________   PROCEDURES  Procedure(s) performed (including Critical Care):  Procedures   ____________________________________________   INITIAL IMPRESSION / ASSESSMENT AND PLAN / ED COURSE  As part of my medical decision making, I reviewed the following data within the electronic MEDICAL RECORD NUMBER         Jean Morris was evaluated in Emergency Department on 12/11/2018 for the symptoms described in the history of present illness. She was evaluated in the context of the global COVID-19 pandemic, which necessitated consideration that the patient might be at risk  for infection with the SARS-CoV-2 virus that causes COVID-19. Institutional protocols and algorithms that pertain to the evaluation of patients at risk for COVID-19 are in a state of rapid change based on information released by regulatory bodies including the CDC and federal and state organizations. These policies and algorithms were followed during the patient's care in the ED.  Patient complain of radicular back pain to the left lower extremity 4 days.  No provocative or complaint.  Patient also complained of urinary frequency and dysuria.  Discussed labs and x-ray findings with patient.  Patient physical exam is consistent with radicular back pain and UTI.  Patient given discharge care instruction work note.  Patient vies take medication as directed and follow-up with PCP.      ____________________________________________   FINAL CLINICAL IMPRESSION(S) / ED DIAGNOSES  Final diagnoses:  Radicular low back pain  Urinary tract infection  without hematuria, site unspecified     ED Discharge Orders         Ordered    traMADol (ULTRAM) 50 MG tablet  Every 6 hours PRN     12/11/18 1021    cyclobenzaprine (FLEXERIL) 10 MG tablet  3 times daily PRN     12/11/18 1021    methylPREDNISolone (MEDROL DOSEPAK) 4 MG TBPK tablet     12/11/18 1021    sulfamethoxazole-trimethoprim (BACTRIM DS) 800-160 MG tablet  2 times daily     12/11/18 1021    phenazopyridine (PYRIDIUM) 200 MG tablet  3 times daily PRN     12/11/18 1021           Note:  This document was prepared using Dragon voice recognition software and may include unintentional dictation errors.    Sable Feil, PA-C 12/11/18 1025    Nena Polio, MD 12/11/18 [phone]

## 2019-05-30 ENCOUNTER — Emergency Department
Admission: EM | Admit: 2019-05-30 | Discharge: 2019-05-30 | Disposition: A | Discharge status: Home / Self Care | Payer: BC Managed Care – PPO | Attending: Emergency Medicine | Admitting: Emergency Medicine

## 2019-05-30 ENCOUNTER — Emergency Department: Payer: BC Managed Care – PPO

## 2019-05-30 ENCOUNTER — Other Ambulatory Visit: Payer: Self-pay

## 2019-05-30 ENCOUNTER — Encounter: Payer: Self-pay | Admitting: Emergency Medicine

## 2019-05-30 DIAGNOSIS — S86811A Strain of other muscle(s) and tendon(s) at lower leg level, right leg, initial encounter: Secondary | ICD-10-CM

## 2019-05-30 DIAGNOSIS — X58XXXA Exposure to other specified factors, initial encounter: Secondary | ICD-10-CM | POA: Insufficient documentation

## 2019-05-30 DIAGNOSIS — Z86711 Personal history of pulmonary embolism: Secondary | ICD-10-CM | POA: Insufficient documentation

## 2019-05-30 DIAGNOSIS — S86911A Strain of unspecified muscle(s) and tendon(s) at lower leg level, right leg, initial encounter: Secondary | ICD-10-CM | POA: Diagnosis not present

## 2019-05-30 DIAGNOSIS — S86111A Strain of other muscle(s) and tendon(s) of posterior muscle group at lower leg level, right leg, initial encounter: Secondary | ICD-10-CM | POA: Insufficient documentation

## 2019-05-30 DIAGNOSIS — Y9301 Activity, walking, marching and hiking: Secondary | ICD-10-CM | POA: Insufficient documentation

## 2019-05-30 DIAGNOSIS — Y999 Unspecified external cause status: Secondary | ICD-10-CM | POA: Diagnosis not present

## 2019-05-30 DIAGNOSIS — S8991XA Unspecified injury of right lower leg, initial encounter: Secondary | ICD-10-CM | POA: Diagnosis not present

## 2019-05-30 DIAGNOSIS — M79661 Pain in right lower leg: Secondary | ICD-10-CM | POA: Diagnosis not present

## 2019-05-30 DIAGNOSIS — I10 Essential (primary) hypertension: Secondary | ICD-10-CM | POA: Diagnosis not present

## 2019-05-30 DIAGNOSIS — Y929 Unspecified place or not applicable: Secondary | ICD-10-CM | POA: Diagnosis not present

## 2019-05-30 MED ORDER — NAPROXEN 500 MG PO TABS: 500.0000 mg | ORAL_TABLET | Freq: Two times a day (BID) | ORAL | 0 refills | Status: DC

## 2019-05-30 MED ORDER — NAPROXEN 500 MG PO TABS
500.0000 mg | ORAL_TABLET | Freq: Once | ORAL | Status: AC
  Administered 2019-05-30: 500 mg via ORAL
  Filled 2019-05-30: qty 1

## 2019-05-30 MED ORDER — CYCLOBENZAPRINE HCL 10 MG PO TABS
10.0000 mg | ORAL_TABLET | Freq: Once | ORAL | Status: AC
  Administered 2019-05-30: 10 mg via ORAL
  Filled 2019-05-30: qty 1

## 2019-05-30 MED ORDER — CYCLOBENZAPRINE HCL 10 MG PO TABS: 10.0000 mg | ORAL_TABLET | Freq: Three times a day (TID) | ORAL | 0 refills | Status: DC | PRN

## 2019-05-30 NOTE — ED Notes (Signed)
Pt walked to room with limp, pain worsens when pressure applied. No obvious swelling or warmth noted to calf during palpation. Oriented to room and call bell.

## 2019-05-30 NOTE — ED Provider Notes (Signed)
[facility] Emergency Department Provider Note ____________________________________________   First MD Initiated Contact with Patient 05/30/19 1826     (approximate)  I have reviewed the triage vital signs and the nursing notes.   HISTORY  Chief Complaint Leg Pain  HPI Jean Morris is a 44 y.o. female with history of PE, hypertension, and anemia presents to the emergency department for treatment and evaluation of sudden onset right calf pain that started while she was walking back to her desk this afternoon.  Patient states that the pain was sudden and she since a "pop."  No alleviating measures attempted prior to arrival.     Past Medical History:  Diagnosis Date  . Anemia   . Hypertension   . Menorrhagia   . Pulmonary embolism [facility])     Patient Active Problem List   Diagnosis Date Noted  . Morbid obesity with BMI of 60.0-69.9, adult ([place]) 04/18/2015  . Abnormal uterine bleeding 04/18/2015  . Anemia 04/18/2015  . Pulmonary embolism ([place]) 03/11/2015    Past Surgical History:  Procedure Laterality Date  . [place]    Prior to Admission medications   Medication Sig Start Date End Date Taking? Authorizing Provider  acetaminophen (TYLENOL 8 HOUR) 650 MG CR tablet Take 1 tablet (650 mg total) by mouth every 8 (eight) hours as needed for pain. 04/18/15   Rubie Maid, MD  cyclobenzaprine (FLEXERIL) 10 MG tablet Take 1 tablet (10 mg total) by mouth 3 (three) times daily as needed. 05/30/19   Cairo Lingenfelter, Johnette Abraham B, FNP  ferrous sulfate 325 (65 FE) MG tablet Take 1 tablet (325 mg total) by mouth 2 (two) times daily with a meal. Patient taking differently: Take 325 mg by mouth 3 (three) times daily with meals.  05/28/16 08/31/17  Tawni Millers, MD  levonorgestrel (MIRENA) 20 MCG/24HR IUD 1 Intra Uterine Device (1 each total) by Intrauterine route once. 04/18/15   Rubie Maid, MD  naproxen (NAPROSYN) 500 MG tablet Take 1 tablet (500 mg total) by  mouth 2 (two) times daily with a meal. 05/30/19   Cheronda Erck B, FNP    Allergies Pollen extract  Family History  Problem Relation Age of Onset  . Hypertension Mother   . Stroke Mother   . Heart attack Mother   . Hypertension Father   . Hypertension Other     Social History Social History   Tobacco Use  . Smoking status: Never Smoker  . Smokeless tobacco: Never Used  Substance Use Topics  . Alcohol use: No  . Drug use: No    Review of Systems  Constitutional: No fever/chills Eyes: No visual changes. ENT: No sore throat. Cardiovascular: Denies chest pain. Respiratory: Denies shortness of breath. Gastrointestinal: No abdominal pain.  No nausea, no vomiting.  No diarrhea.  No constipation. Genitourinary: Negative for dysuria. Musculoskeletal: Positive for right calf pain. Skin: Negative for rash. Neurological: Negative for headaches, focal weakness or numbness. ____________________________________________   PHYSICAL EXAM:  VITAL SIGNS: ED Triage Vitals  Enc Vitals Group     BP 05/30/19 1748 (!) 156/95     Pulse Rate 05/30/19 1748 (!) 107     Resp 05/30/19 1748 20     Temp 05/30/19 1748 99.6 F (37.6 C)     Temp Source 05/30/19 1748 Oral     SpO2 05/30/19 1748 96 %     Weight 05/30/19 1747 (!) 362 lb (164.2 kg)     Height 05/30/19 1747 5' 5"  (  1.651 m)     Head Circumference --      Peak Flow --      Pain Score 05/30/19 1746 9     Pain Loc --      Pain Edu? --      Excl. in [place]? --     Constitutional: Alert and oriented. Well appearing and in no acute distress. Eyes: Conjunctivae are normal. PERRL. Head: Atraumatic. Nose: No congestion/rhinnorhea. Mouth/Throat: Mucous membranes are moist.  Oropharynx non-erythematous. Neck: No stridor.   Cardiovascular: Normal rate, regular rhythm. Grossly normal heart sounds.  Good peripheral circulation. Respiratory: Normal respiratory effort.  No retractions. Lungs CTAB. Gastrointestinal: Soft and nontender. No  distention.  Genitourinary:  Musculoskeletal: Diffuse right lower extremity calf pain that increases with foot flexion. Neurologic:  Normal speech and language. No gross focal neurologic deficits are appreciated. No gait instability. Skin:  Skin is warm, dry and intact. No rash noted. Psychiatric: Mood and affect are normal. Speech and behavior are normal.  ____________________________________________   LABS (all labs ordered are listed, but only abnormal results are displayed)  Labs Reviewed - No data to display ____________________________________________  EKG  Not indicated ____________________________________________  RADIOLOGY  ED MD interpretation:    Venous ultrasound of the right lower extremity is negative for DVT.  Official radiology report(s): US Venous Img Lower Unilateral Right  Result Date: 05/30/2019 CLINICAL DATA:  Right calf pain.  History of DVT EXAM: RIGHT LOWER EXTREMITY VENOUS DOPPLER ULTRASOUND TECHNIQUE: Gray-scale sonography with compression, as well as color and duplex ultrasound, were performed to evaluate the deep venous system(s) from the level of the common femoral vein through the popliteal and proximal calf veins. COMPARISON:  03/12/2015 FINDINGS: VENOUS Normal compressibility of the common femoral, superficial femoral, and popliteal veins, as well as the visualized calf veins. Visualized portions of profunda femoral vein and great saphenous vein unremarkable. No filling defects to suggest DVT on grayscale or color Doppler imaging. Doppler waveforms show normal direction of venous flow, normal respiratory phasicity and response to augmentation. Limited views of the contralateral common femoral vein are unremarkable. OTHER None. Limitations: Limited visualization due to body habitus. IMPRESSION: No evidence of right lower extremity DVT. Electronically Signed   By: Rolm Baptise M.D.   On: 05/30/2019 20:40     ____________________________________________   PROCEDURES  Procedure(s) performed (including Critical Care):  Procedures  ____________________________________________   INITIAL IMPRESSION / ASSESSMENT AND PLAN     44 year old female presenting to the emergency department for treatment and evaluation of acute onset right calf pain.  She does have a history of PE, however denies chest pain or shortness of breath at this time.  Plan will be to get an ultrasound.  Patient is not currently on blood thinner.  DIFFERENTIAL DIAGNOSIS  Musculoskeletal strain, DVT, Achilles tendon tear, muscle tear  ED COURSE  44 year old female presenting to the emergency department after acute onset of right calf pain while walking today.  Ultrasound is negative for acute DVT.  Patient was notified of the results and felt reassured.  She states that she had been doing more walking today than usual.  She will be treated with muscle relaxer and anti-inflammatory and given a work excuse for the next 2 days.  She was instructed to rest, ice, elevate the extremity.  If not improving over the next few days, she is to see her primary care provider.  For symptoms of change or worsen or if she develops any chest pain, shortness of breath, or  other concerns she is to return to the emergency department. ____________________________________________   FINAL CLINICAL IMPRESSION(S) / ED DIAGNOSES  Final diagnoses:  Strain of calf muscle, right, initial encounter     ED Discharge Orders         Ordered    cyclobenzaprine (FLEXERIL) 10 MG tablet  3 times daily PRN     05/30/19 2159    naproxen (NAPROSYN) 500 MG tablet  2 times daily with meals     05/30/19 2159           Novella L Mcglynn was evaluated in Emergency Department on 05/30/2019 for the symptoms described in the history of present illness. She was evaluated in the context of the global COVID-19 pandemic, which necessitated consideration that the  patient might be at risk for infection with the SARS-CoV-2 virus that causes COVID-19. Institutional protocols and algorithms that pertain to the evaluation of patients at risk for COVID-19 are in a state of rapid change based on information released by regulatory bodies including the CDC and federal and state organizations. These policies and algorithms were followed during the patient's care in the ED.   Note:  This document was prepared using Dragon voice recognition software and may include unintentional dictation errors.   Victorino Dike, FNP 05/30/19 2231    Nance Pear, MD 05/30/19 [phone]

## 2019-05-30 NOTE — Discharge Instructions (Signed)
Please follow up with your primary care provider for symptoms that are not improving over the next few days.  Rest, ice, and elevate your leg often throughout the next few days.  Return to the ER for symptoms that change or worsen or for new concerns if unable to schedule an appointment.

## 2019-05-30 NOTE — ED Triage Notes (Signed)
Pt was walking at work and felt something pop in right calf.  No swelling per pt.  Pain shoots from calf to ankle and top of foot. severe pain with ambulation.

## 2019-11-03 ENCOUNTER — Emergency Department: Payer: BC Managed Care – PPO

## 2019-11-03 ENCOUNTER — Other Ambulatory Visit: Payer: Self-pay

## 2019-11-03 ENCOUNTER — Emergency Department
Admission: EM | Admit: 2019-11-03 | Discharge: 2019-11-03 | Disposition: A | Discharge status: Home / Self Care | Payer: BC Managed Care – PPO | Attending: Emergency Medicine | Admitting: Emergency Medicine

## 2019-11-03 DIAGNOSIS — R1310 Dysphagia, unspecified: Secondary | ICD-10-CM | POA: Diagnosis not present

## 2019-11-03 DIAGNOSIS — R0600 Dyspnea, unspecified: Secondary | ICD-10-CM | POA: Diagnosis not present

## 2019-11-03 DIAGNOSIS — I10 Essential (primary) hypertension: Secondary | ICD-10-CM | POA: Insufficient documentation

## 2019-11-03 DIAGNOSIS — R0602 Shortness of breath: Secondary | ICD-10-CM | POA: Diagnosis not present

## 2019-11-03 DIAGNOSIS — R0689 Other abnormalities of breathing: Secondary | ICD-10-CM | POA: Diagnosis not present

## 2019-11-03 MED ORDER — PANTOPRAZOLE SODIUM 40 MG PO TBEC: 40.0000 mg | DELAYED_RELEASE_TABLET | Freq: Every day | ORAL | 1 refills | Status: DC

## 2019-11-03 NOTE — ED Provider Notes (Signed)
[facility] Emergency Department Provider Note  ____________________________________________   First MD Initiated Contact with Patient 11/03/19 1428     (approximate)  I have reviewed the triage vital signs and the nursing notes.   HISTORY  Chief Complaint Spasms    HPI Jean Morris is a 44 y.o. female presents emergency department complaining of laryngeal spasm/difficulty swallowing.  Feels like food gets stuck in her esophagus.  Patient states that if she leans her head back to force feels like she cannot breathe.  Is very positional.  She is not having cough, chest pain, shortness of breath type cardiac symptoms.  This is more just with swallowing solid foods.    Past Medical History:  Diagnosis Date  . Anemia   . Hypertension   . Menorrhagia   . Pulmonary embolism [facility])     Patient Active Problem List   Diagnosis Date Noted  . Morbid obesity with BMI of 60.0-69.9, adult ([place]) 04/18/2015  . Abnormal uterine bleeding 04/18/2015  . Anemia 04/18/2015  . Pulmonary embolism ([place]) 03/11/2015    Past Surgical History:  Procedure Laterality Date  . [place]    Prior to Admission medications   Medication Sig Start Date End Date Taking? Authorizing Provider  acetaminophen (TYLENOL 8 HOUR) 650 MG CR tablet Take 1 tablet (650 mg total) by mouth every 8 (eight) hours as needed for pain. 04/18/15   Jean Maid, MD  ferrous sulfate 325 (65 FE) MG tablet Take 1 tablet (325 mg total) by mouth 2 (two) times daily with a meal. Patient taking differently: Take 325 mg by mouth 3 (three) times daily with meals.  05/28/16 08/31/17  Jean Millers, MD  levonorgestrel (MIRENA) 20 MCG/24HR IUD 1 Intra Uterine Device (1 each total) by Intrauterine route once. 04/18/15   Jean Maid, MD  naproxen (NAPROSYN) 500 MG tablet Take 1 tablet (500 mg total) by mouth 2 (two) times daily with a meal. 05/30/19   Jean Morris  pantoprazole (PROTONIX) 40 MG  tablet Take 1 tablet (40 mg total) by mouth daily. 11/03/19 11/02/20  Jean Morris    Allergies Pollen extract  Family History  Problem Relation Age of Onset  . Hypertension Mother   . Stroke Mother   . Heart attack Mother   . Hypertension Father   . Hypertension Other     Social History Social History   Tobacco Use  . Smoking status: Never Smoker  . Smokeless tobacco: Never Used  Substance Use Topics  . Alcohol use: No  . Drug use: No    Review of Systems  Constitutional: No fever/chills Eyes: No visual changes. ENT: No sore throat.  Difficulty swallowing Respiratory: Denies cough Cardiovascular: Denies chest pain Gastrointestinal: Denies abdominal pain Genitourinary: Negative for dysuria. Musculoskeletal: Negative for back pain. Skin: Negative for rash. Psychiatric: no mood changes,     ____________________________________________   PHYSICAL EXAM:  VITAL SIGNS: ED Triage Vitals  Enc Vitals Group     BP 11/03/19 1317 130/72     Pulse Rate 11/03/19 1317 96     Resp 11/03/19 1317 18     Temp 11/03/19 1317 98.4 F (36.9 C)     Temp Source 11/03/19 1317 Oral     SpO2 11/03/19 1317 98 %     Weight 11/03/19 1318 (!) 362 lb (164.2 kg)     Height 11/03/19 1318 5' 5"  (1.651 m)     Head Circumference --  Peak Flow --      Pain Score 11/03/19 1317 0     Pain Loc --      Pain Edu? --      Excl. in [place]? --     Constitutional: Alert and oriented. Well appearing and in no acute distress. Eyes: Conjunctivae are normal.  Head: Atraumatic. Nose: No congestion/rhinnorhea. Mouth/Throat: Mucous membranes are moist.  Throat appears normal although the airway does appear to be small Neck:  supple no lymphadenopathy noted Cardiovascular: Normal rate, regular rhythm. Heart sounds are normal Respiratory: Normal respiratory effort.  No retractions, lungs c t a  Abd: soft nontender bs normal all 4 quad GU: deferred Musculoskeletal: FROM all extremities, warm  and well perfused Neurologic:  Normal speech and language.  Skin:  Skin is warm, dry and intact. No rash noted. Psychiatric: Mood and affect are normal. Speech and behavior are normal.  ____________________________________________   LABS (all labs ordered are listed, but only abnormal results are displayed)  Labs Reviewed - No data to display ____________________________________________   ____________________________________________  RADIOLOGY  Diagnostic soft tissue the neck x-ray is negative, chest x-ray is negative  ____________________________________________   PROCEDURES  Procedure(s) performed: No  Procedures    ____________________________________________   INITIAL IMPRESSION / ASSESSMENT AND PLAN / ED COURSE  Pertinent labs & imaging results that were available during my care of the patient were reviewed by me and considered in my medical decision making (see chart for details).   Patient is a 44 year old female presents emergency department complaint of difficulty swallowing.  See HPI.  Physical exam is unremarkable.  Patient appears well.  Able to speak in full sentences.  Vitals are normal.  Remainder of exam is unremarkable  DDx: Esophagitis, dysphagia, mass  X-ray of the soft tissues of the neck and chest x-ray are negative for any acute abnormalities  I did explain findings to the patient.  Feel that she needs to follow-up with a GI specialist.  Explained to her that difficulty swallowing along with reflux could cause some inflammation along the esophagus.  I do feel that she needs to see a specialist.  If everything is negative with this specialist then she should follow-up with pulmonology.  Return emergency department if worsening.  She was given a prescription for Protonix.     Jean Morris was evaluated in Emergency Department on 11/03/2019 for the symptoms described in the history of present illness. She was evaluated in the context of the global  COVID-19 pandemic, which necessitated consideration that the patient might be at risk for infection with the SARS-CoV-2 virus that causes COVID-19. Institutional protocols and algorithms that pertain to the evaluation of patients at risk for COVID-19 are in a state of rapid change based on information released by regulatory bodies including the CDC and federal and state organizations. These policies and algorithms were followed during the patient's care in the ED.    As part of my medical decision making, I reviewed the following data within the [place] notes reviewed and incorporated, Old chart reviewed, Radiograph reviewed , Notes from prior ED visits and [place] Controlled Substance Database  ____________________________________________   FINAL CLINICAL IMPRESSION(S) / ED DIAGNOSES  Final diagnoses:  Difficulty swallowing solids      NEW MEDICATIONS STARTED DURING THIS VISIT:  New Prescriptions   PANTOPRAZOLE (PROTONIX) 40 MG TABLET    Take 1 tablet (40 mg total) by mouth daily.     Note:  This document was  prepared using Systems analyst and may include unintentional dictation errors.    Versie Starks, Morris 11/03/19 1612    Carrie Mew, MD 11/05/19 [phone]

## 2019-11-03 NOTE — ED Triage Notes (Signed)
Pt states she has been having issues with like her larynx spasming intermittently over the past 10 days, states today it scared her, states it happened while eating veggies chips, "I couldn't breathing" only lasting about 1-2 seconds, after drinking it goes away. Denies any other sx, no SOB or CP

## 2019-11-03 NOTE — Discharge Instructions (Addendum)
Follow-up with Dr. Marius Ditch.  Please call for appointment.  Try the Protonix to see if this helps with your reflux.  Return the emergency department if you are feeling short of breath.

## 2020-01-08 ENCOUNTER — Other Ambulatory Visit: Payer: Self-pay

## 2020-01-08 ENCOUNTER — Emergency Department
Admission: EM | Admit: 2020-01-08 | Discharge: 2020-01-08 | Disposition: A | Discharge status: Home / Self Care | Payer: BC Managed Care – PPO | Attending: Emergency Medicine | Admitting: Emergency Medicine

## 2020-01-08 DIAGNOSIS — I10 Essential (primary) hypertension: Secondary | ICD-10-CM | POA: Diagnosis not present

## 2020-01-08 DIAGNOSIS — K209 Esophagitis, unspecified without bleeding: Secondary | ICD-10-CM | POA: Insufficient documentation

## 2020-01-08 DIAGNOSIS — R0989 Other specified symptoms and signs involving the circulatory and respiratory systems: Secondary | ICD-10-CM | POA: Diagnosis not present

## 2020-01-08 DIAGNOSIS — R0602 Shortness of breath: Secondary | ICD-10-CM | POA: Diagnosis not present

## 2020-01-08 MED ORDER — FAMOTIDINE 40 MG/5ML PO SUSR: 40.0000 mg | Freq: Every day | ORAL | 0 refills | Status: DC

## 2020-01-08 MED ORDER — LIDOCAINE VISCOUS HCL 2 % MT SOLN: 15.0000 mL | OROMUCOSAL | 0 refills | Status: AC | PRN

## 2020-01-08 MED ORDER — OMEPRAZOLE 2 MG/ML ORAL SUSPENSION: 40.0000 mg | Freq: Every day | ORAL | 0 refills | Status: DC

## 2020-01-08 NOTE — Discharge Instructions (Addendum)
Please follow-up with primary care regarding thyroid and need for evaluation.

## 2020-01-08 NOTE — ED Notes (Signed)
No peripheral IV placed this visit.   Discharge instructions reviewed with patient. Questions fielded by this RN. Patient verbalizes understanding of instructions. Patient discharged home in stable condition per [place], [place] . No acute distress noted at time of discharge.   Pt wheeled to family car in front of lobby

## 2020-01-08 NOTE — ED Triage Notes (Signed)
First Nurse: patient brought in by ems from [facility]. Patient was seen about 2 weeks ago and told that she has inflamed airways. Patient reported to ems that she felt like she was choking. Per ems bp 151/96 and hr 108.

## 2020-01-08 NOTE — ED Triage Notes (Addendum)
Pt arrived via POV with reports of diffiiculty swallowing solids, pt seen 2 months ago for the same, was told to follow up with GI but has not followed up yet.  Pt states she has been taking the protonix but not daily. States she has 3 doses left.   Pt states the pain started after eating a hot dog tonight.  Pt reports she has been afraid to follow up with GI because she doesn't want to be put to sleep.  Pt reports she has had colon and EGD before.

## 2020-01-08 NOTE — ED Notes (Signed)
Pt c/o of discomfort when swallowing

## 2020-01-09 NOTE — ED Provider Notes (Signed)
[facility] Emergency Department Provider Note  ___________________________________________   First MD Initiated Contact with Patient 01/08/20 2101     (approximate)  I have reviewed the triage vital signs and the nursing notes.   HISTORY  Chief Complaint Sore Throat  HPI Jean Morris is a 44 y.o. female who presents to the emergency department for evaluation of globus sensation in the throat.  The patient was seen in this facility on 11/03/2019 for similar symptoms.  She states that recently, she has had trouble swallowing solid food.  She is able to drink liquids and has no problem with food such as applesauce, however any other foods make her feel like she is choking on her food.  She feels that she is not able to get them down.  This was initially happening as 1-2 episodes a week, but is now happening multiple times per day.  The patient states that the only way that she can improve her symptoms is with burping or forcing the food up.  At last visit, she was given a prescription for Protonix.  She has not been taking this daily, but states that she did not really notice much improvement with the medicine.  She was instructed to follow-up with GI, but states that she did not do so at the time because she was concerned that she was going to be put to sleep and was afraid for that to happen.  She denies feeling like there is food currently stuck in her throat at this moment.  She endorses shortness of breath with episodes of feeling like her food is stuck, however does not have any at this time.  Denies chest pain, abdominal pain, nausea vomiting or diarrhea.         Past Medical History:  Diagnosis Date  . Anemia   . Hypertension   . Menorrhagia   . Pulmonary embolism [facility])     Patient Active Problem List   Diagnosis Date Noted  . Morbid obesity with BMI of 60.0-69.9, adult ([place]) 04/18/2015  . Abnormal uterine bleeding 04/18/2015  . Anemia 04/18/2015  .  Pulmonary embolism ([place]) 03/11/2015    Past Surgical History:  Procedure Laterality Date  . [place]    Prior to Admission medications   Medication Sig Start Date End Date Taking? Authorizing Provider  acetaminophen (TYLENOL 8 HOUR) 650 MG CR tablet Take 1 tablet (650 mg total) by mouth every 8 (eight) hours as needed for pain. 04/18/15   Rubie Maid, MD  famotidine (PEPCID) 40 MG/5ML suspension Take 5 mLs (40 mg total) by mouth daily. 01/08/20 02/07/20  Marlana Salvage, PA  ferrous sulfate 325 (65 FE) MG tablet Take 1 tablet (325 mg total) by mouth 2 (two) times daily with a meal. Patient taking differently: Take 325 mg by mouth 3 (three) times daily with meals.  05/28/16 08/31/17  Tawni Millers, MD  levonorgestrel (MIRENA) 20 MCG/24HR IUD 1 Intra Uterine Device (1 each total) by Intrauterine route once. 04/18/15   Rubie Maid, MD  lidocaine (XYLOCAINE) 2 % solution Use as directed 15 mLs in the mouth or throat every 4 (four) hours as needed for up to 10 days for mouth pain. 01/08/20 01/18/20  Marlana Salvage, PA  naproxen (NAPROSYN) 500 MG tablet Take 1 tablet (500 mg total) by mouth 2 (two) times daily with a meal. 05/30/19   Triplett, Cari B, FNP  omeprazole (FIRST-OMEPRAZOLE) 2 mg/mL SUSP oral suspension Take 20 mLs (  40 mg total) by mouth daily. 01/08/20 02/07/20  Marlana Salvage, PA    Allergies Pollen extract  Family History  Problem Relation Age of Onset  . Hypertension Mother   . Stroke Mother   . Heart attack Mother   . Hypertension Father   . Hypertension Other     Social History Social History   Tobacco Use  . Smoking status: Never Smoker  . Smokeless tobacco: Never Used  Substance Use Topics  . Alcohol use: No  . Drug use: No    Review of Systems Constitutional: No fever/chills Eyes: No visual changes. ENT: No sore throat. Cardiovascular: Denies chest pain. Respiratory: Denies shortness of breath. Gastrointestinal: + With sensation in  throat, no abdominal pain.  No nausea, no vomiting.  No diarrhea.  No constipation. Genitourinary: Negative for dysuria. Musculoskeletal: Negative for back pain. Skin: Negative for rash. Neurological: Negative for headaches, focal weakness or numbness.   ____________________________________________   PHYSICAL EXAM:  VITAL SIGNS: ED Triage Vitals  Enc Vitals Group     BP 01/08/20 2022 136/87     Pulse Rate 01/08/20 2022 94     Resp 01/08/20 2022 18     Temp 01/08/20 2022 98.9 F (37.2 C)     Temp Source 01/08/20 2022 Oral     SpO2 01/08/20 2022 100 %     Weight 01/08/20 2027 (!) 347 lb (157.4 kg)     Height 01/08/20 2027 5' 5"  (1.651 m)     Head Circumference --      Peak Flow --      Pain Score 01/08/20 2030 0     Pain Loc --      Pain Edu? --      Excl. in [place]? --    Constitutional: Alert and oriented. Well appearing and in no acute distress. Eyes: Conjunctivae are normal. PERRL. EOMI. Head: Atraumatic. Nose: No congestion/rhinnorhea. Mouth/Throat: Mucous membranes are moist.  Oropharynx non-erythematous. Neck: No stridor.  Palpated thyroid feels enlarged bilaterally and symmetrically.   Lymphatic: No cervical or supraclavicular lymphadenopathy.   Cardiovascular: Normal rate, regular rhythm. Grossly normal heart sounds.  Good peripheral circulation. Respiratory: Normal respiratory effort.  No retractions. Lungs CTAB. Gastrointestinal: Soft and nontender. No distention. No abdominal bruits. No CVA tenderness. Musculoskeletal: No lower extremity tenderness nor edema.  No joint effusions. Neurologic:  Normal speech and language. No gross focal neurologic deficits are appreciated. No gait instability. Skin:  Skin is warm, dry and intact. No rash noted. Psychiatric: Mood and affect are normal. Speech and behavior are normal.  ____________________________________________   INITIAL IMPRESSION / ASSESSMENT AND PLAN / ED COURSE  As part of my medical decision making, I  reviewed the following data within the [facility] notes reviewed and incorporated and Notes from prior ED visits        Patient is a 44 year old female who presents to the emergency department for evaluation of multiple episodes of feeling like food is getting stuck in her throat.  Review of her records reveals that she has presented to the emergency department 2 months ago on 11/03/2019 for the same and was treated with a round of Protonix and a referral for GI.  Patient did not see GI.  Currently, the patient is maintaining her own secretions and does not feel that she currently has anything stuck in her throat.  Palpation of her neck does reveal enlarged thyroid, possibly a goiter that is enlarged bilaterally and symmetrically.  Possible work-up  was discussed with the patient, including the possibility of CT with contrast to evaluate for food bolus.  Given that the patient feels that this episode has passed, she does not wish to proceed with this work-up at this time.  She states that she will see GI this time, to receive further work-up for them.  The patient was prescribed some Pepcid and Prilosec liquid as she feels that she is going to struggle to get pills down.  She was also prescribed viscous lidocaine to aid with the sensation.  She will follow up with GI, or present to the emergency department with any worsening.      ____________________________________________   FINAL CLINICAL IMPRESSION(S) / ED DIAGNOSES  Final diagnoses:  Esophagitis     ED Discharge Orders         Ordered    lidocaine (XYLOCAINE) 2 % solution  Every 4 hours PRN        01/08/20 2202    famotidine (PEPCID) 40 MG/5ML suspension  Daily        01/08/20 2202    omeprazole (FIRST-OMEPRAZOLE) 2 mg/mL SUSP oral suspension  Daily        01/08/20 2202          *Please note:  Jean Morris was evaluated in Emergency Department on 01/09/2020 for the symptoms described in the history of  present illness. She was evaluated in the context of the global COVID-19 pandemic, which necessitated consideration that the patient might be at risk for infection with the SARS-CoV-2 virus that causes COVID-19. Institutional protocols and algorithms that pertain to the evaluation of patients at risk for COVID-19 are in a state of rapid change based on information released by regulatory bodies including the CDC and federal and state organizations. These policies and algorithms were followed during the patient's care in the ED.  Some ED evaluations and interventions may be delayed as a result of limited staffing during and the pandemic.*   Note:  This document was prepared using Dragon voice recognition software and may include unintentional dictation errors.    Marlana Salvage, PA 01/09/20 2256    Delman Kitten, MD 01/13/20 [phone]

## 2020-01-26 ENCOUNTER — Ambulatory Visit (INDEPENDENT_AMBULATORY_CARE_PROVIDER_SITE_OTHER): Payer: BC Managed Care – PPO | Admitting: Gastroenterology

## 2020-01-26 ENCOUNTER — Other Ambulatory Visit: Payer: Self-pay

## 2020-01-26 VITALS — BP 121/79 | HR 104 | Temp 98.3°F | Ht 65.0 in | Wt 324.0 lb

## 2020-01-26 DIAGNOSIS — R1310 Dysphagia, unspecified: Secondary | ICD-10-CM

## 2020-01-26 MED ORDER — OMEPRAZOLE 40 MG PO CPDR: 40.0000 mg | DELAYED_RELEASE_CAPSULE | Freq: Every day | ORAL | 0 refills | Status: DC

## 2020-01-26 NOTE — Addendum Note (Signed)
Addended by: Dorethea Clan on: 01/26/2020 01:47 PM   Modules accepted: Orders

## 2020-01-26 NOTE — Progress Notes (Signed)
Jonathon Bellows MD, MRCP(U.K) [address]  [place]  [place], [place] [address]  Main: [phone]  Fax: [phone]   Gastroenterology Consultation  Referring Provider:     Tawni Millers, MD Primary Care Physician:  Tawni Millers, MD Primary Gastroenterologist:  Dr. Jonathon Bellows  Reason for Consultation: Dysphagia and esophagitis        HPI:   British L Lukin is a 44 y.o. y/o female who recently presented to the emergency on 2 occasions initially on 11/03/2019 with difficulty swallowing solids.  Felt like the food got stuck in her esophagus.  Occurred more for solid foods.  She had an x-ray of her neck which is negative subsequently she was treated for esophagitis given a course of Protonix and advised to follow-up with GI.  She subsequently presented to the emergency room on 01/08/2020 with a sore throat and globus sensation.  And was subsequently referred to see me.  No recent labs.  She has been having difficulty swallowing solids for the past few months.  Feels like it gets stuck in the middle of her chest.  More for meats.  Since then she has changed to a soft diet and avoid red meats.  With the change of diet she lost about 20 pounds but overall over the past few years she has gained over 50 pounds in weight.  Has been taking liquid Prilosec 20 mg a day.  No recent endoscopy.  She sleeps on her stomach.  Denies any asthma.  History of pulmonary embolism in the past.  Past Medical History:  Diagnosis Date  . Anemia   . Hypertension   . Menorrhagia   . Pulmonary embolism [facility])     Past Surgical History:  Procedure Laterality Date  . [place]    Prior to Admission medications   Medication Sig Start Date End Date Taking? Authorizing Provider  acetaminophen (TYLENOL 8 HOUR) 650 MG CR tablet Take 1 tablet (650 mg total) by mouth every 8 (eight) hours as needed for pain. 04/18/15   Rubie Maid, MD  famotidine (PEPCID) 40 MG/5ML suspension Take 5 mLs (40 mg  total) by mouth daily. 01/08/20 02/07/20  Marlana Salvage, PA  ferrous sulfate 325 (65 FE) MG tablet Take 1 tablet (325 mg total) by mouth 2 (two) times daily with a meal. Patient taking differently: Take 325 mg by mouth 3 (three) times daily with meals.  05/28/16 08/31/17  Tawni Millers, MD  levonorgestrel (MIRENA) 20 MCG/24HR IUD 1 Intra Uterine Device (1 each total) by Intrauterine route once. 04/18/15   Rubie Maid, MD  naproxen (NAPROSYN) 500 MG tablet Take 1 tablet (500 mg total) by mouth 2 (two) times daily with a meal. 05/30/19   Triplett, Cari B, FNP  omeprazole (FIRST-OMEPRAZOLE) 2 mg/mL SUSP oral suspension Take 20 mLs (40 mg total) by mouth daily. 01/08/20 02/07/20  Marlana Salvage, PA    Family History  Problem Relation Age of Onset  . Hypertension Mother   . Stroke Mother   . Heart attack Mother   . Hypertension Father   . Hypertension Other      Social History   Tobacco Use  . Smoking status: Never Smoker  . Smokeless tobacco: Never Used  Substance Use Topics  . Alcohol use: No  . Drug use: No    Allergies as of 01/26/2020 - Review Complete 01/08/2020  Allergen Reaction Noted  . Pollen extract Nausea And Vomiting 05/28/2016    Review  of Systems:    All systems reviewed and negative except where noted in HPI.   Physical Exam:  There were no vitals taken for this visit. No LMP recorded. Psych:  Alert and cooperative. Normal mood and affect. General:   Alert,  Well-developed, well-nourished, pleasant and cooperative in NAD Head:  Normocephalic and atraumatic. Eyes:  Sclera clear, no icterus.   Conjunctiva pink. Ears:  Normal auditory acuity. Lungs:  Respirations even and unlabored.  Clear throughout to auscultation.   No wheezes, crackles, or rhonchi. No acute distress. Heart:  Regular rate and rhythm; no murmurs, clicks, rubs, or gallops. Abdomen:  Normal bowel sounds.  No bruits.  Soft, non-tender and non-distended without masses, hepatosplenomegaly or  hernias noted.  No guarding or rebound tenderness.    Extremities:  No clubbing or edema.  No cyanosis. Neurologic:  Alert and oriented x3;  grossly normal neurologically. Psych:  Alert and cooperative. Normal mood and affect.  Imaging Studies: No results found.  Assessment and Plan:   Elis L Mikkelson is a 44 y.o. y/o female has been referred for dysphagia.  2 recent ER visits.  Plan 1.  EGD to rule out a stricture and eosinophilic esophagitis 2.  Continue Protonix or Prilosec 40 mg a day 30 minutes before breakfast. 3.  Counseled about lifestyle changes for acid reflux including weight loss, appropriate timing of PPI use, using a wedge pillow.  Discussed briefly about weight loss surgery but she is not keen on it.  She would like to try losing weight on her own.  I have discussed alternative options, risks & benefits,  which include, but are not limited to, bleeding, infection, perforation,respiratory complication & drug reaction.  The patient agrees with this plan & written consent will be obtained.     Follow up in 8-12 weeks   Dr Jonathon Bellows MD,MRCP(U.K)

## 2020-02-01 ENCOUNTER — Other Ambulatory Visit: Payer: Self-pay

## 2020-02-01 ENCOUNTER — Telehealth: Payer: Self-pay | Admitting: Gastroenterology

## 2020-02-01 NOTE — Telephone Encounter (Signed)
Pt left medication in [place], needs refill called in please

## 2020-02-02 NOTE — Telephone Encounter (Signed)
Called pt and informed her that her preferred pharmacy states she'll have to pay out of pocket for her omeprazole refill as her insurance will not cover the prescription if she requests the medication before her refill is due. Pt understands.

## 2020-02-07 ENCOUNTER — Other Ambulatory Visit: Payer: Self-pay

## 2020-02-07 ENCOUNTER — Other Ambulatory Visit
Admission: RE | Admit: 2020-02-07 | Discharge: 2020-02-07 | Disposition: A | Discharge status: Home / Self Care | Payer: BC Managed Care – PPO | Source: Ambulatory Visit | Attending: Gastroenterology | Admitting: Gastroenterology

## 2020-02-07 DIAGNOSIS — Z79899 Other long term (current) drug therapy: Secondary | ICD-10-CM | POA: Diagnosis not present

## 2020-02-07 DIAGNOSIS — K222 Esophageal obstruction: Secondary | ICD-10-CM | POA: Diagnosis not present

## 2020-02-07 DIAGNOSIS — Z01812 Encounter for preprocedural laboratory examination: Secondary | ICD-10-CM | POA: Insufficient documentation

## 2020-02-07 DIAGNOSIS — Z20822 Contact with and (suspected) exposure to covid-19: Secondary | ICD-10-CM | POA: Insufficient documentation

## 2020-02-07 DIAGNOSIS — Z791 Long term (current) use of non-steroidal anti-inflammatories (NSAID): Secondary | ICD-10-CM | POA: Diagnosis not present

## 2020-02-07 DIAGNOSIS — R1310 Dysphagia, unspecified: Secondary | ICD-10-CM | POA: Diagnosis not present

## 2020-02-07 DIAGNOSIS — Z793 Long term (current) use of hormonal contraceptives: Secondary | ICD-10-CM | POA: Diagnosis not present

## 2020-02-07 LAB — SARS CORONAVIRUS 2 (TAT 6-24 HRS): SARS Coronavirus 2: NEGATIVE

## 2020-02-09 ENCOUNTER — Ambulatory Visit
Admission: RE | Admit: 2020-02-09 | Discharge: 2020-02-09 | Disposition: A | Discharge status: Home / Self Care | Payer: BC Managed Care – PPO | Attending: Gastroenterology | Admitting: Gastroenterology

## 2020-02-09 ENCOUNTER — Other Ambulatory Visit: Payer: Self-pay

## 2020-02-09 ENCOUNTER — Ambulatory Visit: Payer: BC Managed Care – PPO | Admitting: Anesthesiology

## 2020-02-09 ENCOUNTER — Encounter: Payer: Self-pay | Admitting: Gastroenterology

## 2020-02-09 ENCOUNTER — Encounter
Admission: RE | Disposition: A | Discharge status: Home / Self Care | Payer: Self-pay | Source: Home / Self Care | Attending: Gastroenterology

## 2020-02-09 DIAGNOSIS — Z791 Long term (current) use of non-steroidal anti-inflammatories (NSAID): Secondary | ICD-10-CM | POA: Diagnosis not present

## 2020-02-09 DIAGNOSIS — I10 Essential (primary) hypertension: Secondary | ICD-10-CM | POA: Diagnosis not present

## 2020-02-09 DIAGNOSIS — Z20822 Contact with and (suspected) exposure to covid-19: Secondary | ICD-10-CM | POA: Diagnosis not present

## 2020-02-09 DIAGNOSIS — Z793 Long term (current) use of hormonal contraceptives: Secondary | ICD-10-CM | POA: Insufficient documentation

## 2020-02-09 DIAGNOSIS — K222 Esophageal obstruction: Secondary | ICD-10-CM

## 2020-02-09 DIAGNOSIS — R1310 Dysphagia, unspecified: Secondary | ICD-10-CM | POA: Diagnosis not present

## 2020-02-09 DIAGNOSIS — Z79899 Other long term (current) drug therapy: Secondary | ICD-10-CM | POA: Diagnosis not present

## 2020-02-09 HISTORY — PX: ESOPHAGOGASTRODUODENOSCOPY (EGD) WITH PROPOFOL: SHX5813

## 2020-02-09 LAB — POCT PREGNANCY, URINE: Preg Test, Ur: NEGATIVE

## 2020-02-09 SURGERY — ESOPHAGOGASTRODUODENOSCOPY (EGD) WITH PROPOFOL
Anesthesia: General

## 2020-02-09 MED ORDER — PROPOFOL 10 MG/ML IV BOLUS
INTRAVENOUS | Status: DC | PRN
  Administered 2020-02-09 (×3): 10 mg via INTRAVENOUS
  Administered 2020-02-09: 50 mg via INTRAVENOUS

## 2020-02-09 MED ORDER — GLYCOPYRROLATE 0.2 MG/ML IJ SOLN
INTRAMUSCULAR | Status: AC
  Filled 2020-02-09: qty 2

## 2020-02-09 MED ORDER — PROPOFOL 500 MG/50ML IV EMUL
INTRAVENOUS | Status: DC | PRN
  Administered 2020-02-09: 145 ug/kg/min via INTRAVENOUS

## 2020-02-09 MED ORDER — DEXMEDETOMIDINE (PRECEDEX) IN NS 20 MCG/5ML (4 MCG/ML) IV SYRINGE
PREFILLED_SYRINGE | INTRAVENOUS | Status: DC | PRN
  Administered 2020-02-09: 12 ug via INTRAVENOUS

## 2020-02-09 MED ORDER — GLYCOPYRROLATE 0.2 MG/ML IJ SOLN
INTRAMUSCULAR | Status: DC | PRN
  Administered 2020-02-09: .2 mg via INTRAVENOUS

## 2020-02-09 MED ORDER — LIDOCAINE HCL (CARDIAC) PF 100 MG/5ML IV SOSY
PREFILLED_SYRINGE | INTRAVENOUS | Status: DC | PRN
  Administered 2020-02-09: 100 mg via INTRAVENOUS

## 2020-02-09 MED ORDER — SODIUM CHLORIDE 0.9 % IV SOLN: INTRAVENOUS | Status: DC

## 2020-02-09 MED ORDER — PROPOFOL 500 MG/50ML IV EMUL
INTRAVENOUS | Status: AC
  Filled 2020-02-09: qty 350

## 2020-02-09 MED ORDER — LIDOCAINE HCL (PF) 2 % IJ SOLN
INTRAMUSCULAR | Status: AC
  Filled 2020-02-09: qty 25

## 2020-02-09 NOTE — Anesthesia Postprocedure Evaluation (Signed)
Anesthesia Post Note  Patient: Jean Morris  Procedure(s) Performed: ESOPHAGOGASTRODUODENOSCOPY (EGD) WITH PROPOFOL (N/A )  Patient location during evaluation: Endoscopy Anesthesia Type: General Level of consciousness: awake and alert and oriented Pain management: pain level controlled Vital Signs Assessment: post-procedure vital signs reviewed and stable Respiratory status: spontaneous breathing Cardiovascular status: blood pressure returned to baseline Anesthetic complications: no   No complications documented.   Last Vitals:  Vitals:   02/09/20 0834 02/09/20 0844  BP: 119/81 115/82  Pulse: 80 77  Resp: 20 20  Temp:    SpO2: 99% 99%    Last Pain:  Vitals:   02/09/20 0844  TempSrc:   PainSc: 0-No pain                 Kentrail Shew

## 2020-02-09 NOTE — Anesthesia Procedure Notes (Signed)
Procedure Name: General with mask airway Performed by: Kelton Pillar, CRNA Pre-anesthesia Checklist: Patient identified, Emergency Drugs available, Suction available and Patient being monitored Patient Re-evaluated:Patient Re-evaluated prior to induction Oxygen Delivery Method: Simple face mask Induction Type: IV induction Placement Confirmation: positive ETCO2 and CO2 detector Dental Injury: Teeth and Oropharynx as per pre-operative assessment

## 2020-02-09 NOTE — H&P (Signed)
Jonathon Bellows, MD [address] [place], [place], [place], [address] [address], [place], [place], [place], [address] Phone: [phone]  Fax: [phone]  Primary Care Physician:  Tawni Millers, MD   Pre-Procedure History & Physical: HPI:  Jean Morris is a 44 y.o. female is here for an endoscopy    Past Medical History:  Diagnosis Date  . Anemia   . Hypertension   . Menorrhagia   . Pulmonary embolism [facility])     Past Surgical History:  Procedure Laterality Date  . [place]    Prior to Admission medications   Medication Sig Start Date End Date Taking? Authorizing Provider  acetaminophen (TYLENOL 8 HOUR) 650 MG CR tablet Take 1 tablet (650 mg total) by mouth every 8 (eight) hours as needed for pain. Patient not taking: Reported on 01/26/2020 04/18/15   Rubie Maid, MD  famotidine (PEPCID) 40 MG/5ML suspension Take 5 mLs (40 mg total) by mouth daily. 01/08/20 02/07/20  Marlana Salvage, PA  ferrous sulfate 325 (65 FE) MG tablet Take 1 tablet (325 mg total) by mouth 2 (two) times daily with a meal. Patient taking differently: Take 325 mg by mouth 3 (three) times daily with meals.  05/28/16 08/31/17  Tawni Millers, MD  levonorgestrel (MIRENA) 20 MCG/24HR IUD 1 Intra Uterine Device (1 each total) by Intrauterine route once. 04/18/15   Rubie Maid, MD  naproxen (NAPROSYN) 500 MG tablet Take 1 tablet (500 mg total) by mouth 2 (two) times daily with a meal. Patient not taking: Reported on 01/26/2020 05/30/19   Victorino Dike, FNP  omeprazole (FIRST-OMEPRAZOLE) 2 mg/mL SUSP oral suspension Take 20 mLs (40 mg total) by mouth daily. 01/08/20 02/07/20  Marlana Salvage, PA  omeprazole (PRILOSEC) 40 MG capsule Take 1 capsule (40 mg total) by mouth daily. Patient not taking: Reported on 02/09/2020 01/26/20   Jonathon Bellows, MD    Allergies as of 01/26/2020 - Review Complete 01/26/2020  Allergen Reaction Noted  . Pollen extract Nausea And Vomiting 05/28/2016     Family History  Problem Relation Age of Onset  . Hypertension Mother   . Stroke Mother   . Heart attack Mother   . Hypertension Father   . Hypertension Other     Social History   Socioeconomic History  . Marital status: Single    Spouse name: Not on file  . Number of children: Not on file  . Years of education: Not on file  . Highest education level: Not on file  Occupational History  . Not on file  Tobacco Use  . Smoking status: Never Smoker  . Smokeless tobacco: Never Used  Substance and Sexual Activity  . Alcohol use: No  . Drug use: No  . Sexual activity: Not Currently    Birth control/protection: None  Other Topics Concern  . Not on file  Social History Narrative  . Not on file   Social Determinants of Health   Financial Resource Strain: Not on file  Food Insecurity: Not on file  Transportation Needs: Not on file  Physical Activity: Not on file  Stress: Not on file  Social Connections: Not on file  Intimate Partner Violence: Not on file    Review of Systems: See HPI, otherwise negative ROS  Physical Exam: BP (!) 136/92   Pulse (!) 110   Temp (!) 97.1 F (36.2 C) (Temporal)   Resp (!) 22   Ht 5' 5"  (1.651 m)  Wt (!) 147 kg   SpO2 100%   BMI 53.92 kg/m  General:   Alert,  pleasant and cooperative in NAD Head:  Normocephalic and atraumatic. Neck:  Supple; no masses or thyromegaly. Lungs:  Clear throughout to auscultation, normal respiratory effort.    Heart:  +S1, +S2, Regular rate and rhythm, No edema. Abdomen:  Soft, nontender and nondistended. Normal bowel sounds, without guarding, and without rebound.   Neurologic:  Alert and  oriented x4;  grossly normal neurologically.  Impression/Plan: Jean Morris is here for an endoscopy  to be performed for  evaluation of dysphagia    Risks, benefits, limitations, and alternatives regarding endoscopy have been reviewed with the patient.  Questions have been answered.  All parties  agreeable.   Jonathon Bellows, MD  02/09/2020, 8:06 AM

## 2020-02-09 NOTE — Transfer of Care (Signed)
Immediate Anesthesia Transfer of Care Note  Patient: Candus L Calia  Procedure(s) Performed: ESOPHAGOGASTRODUODENOSCOPY (EGD) WITH PROPOFOL (N/A )  Patient Location: Endoscopy Unit  Anesthesia Type:General  Level of Consciousness: awake, drowsy and patient cooperative  Airway & Oxygen Therapy: Patient Spontanous Breathing and Patient connected to face mask oxygen  Post-op Assessment: Report given to RN and Post -op Vital signs reviewed and stable  Post vital signs: Reviewed and stable  Last Vitals:  Vitals Value Taken Time  BP 111/93 02/09/20 0824  Temp    Pulse 91 02/09/20 0825  Resp 16 02/09/20 0825  SpO2 100 % 02/09/20 0825  Vitals shown include unvalidated device data.  Last Pain:  Vitals:   02/09/20 0824  TempSrc:   PainSc: 0-No pain         Complications: No complications documented.

## 2020-02-09 NOTE — Op Note (Signed)
[facility] Gastroenterology Patient Name: Jean Morris Procedure Date: 02/09/2020 7:06 AM MRN: [id number] Account #: [id number] Date of Birth: 26-Dec-1975 Admit Type: Outpatient Age: 44 Room: [facility] ENDO ROOM 3 Gender: Female Note Status: Finalized Procedure:             Upper GI endoscopy Indications:           Dysphagia Providers:             Jonathon Bellows MD, MD Referring MD:          Dianah Field. Mable Fill, MD (Referring MD) Medicines:             Monitored Anesthesia Care Complications:         No immediate complications. Procedure:             Pre-Anesthesia Assessment:                        - Prior to the procedure, a History and Physical was                         performed, and patient medications, allergies and                         sensitivities were reviewed. The patient's tolerance                         of previous anesthesia was reviewed.                        - The risks and benefits of the procedure and the                         sedation options and risks were discussed with the                         patient. All questions were answered and informed                         consent was obtained.                        - ASA Grade Assessment: III - A patient with severe                         systemic disease.                        After obtaining informed consent, the endoscope was                         passed under direct vision. Throughout the procedure,                         the patient's blood pressure, pulse, and oxygen                         saturations were monitored continuously. The Endoscope                         was introduced through the mouth, and advanced  to the                         third part of duodenum. The upper GI endoscopy was                         accomplished with ease. The patient tolerated the                         procedure well. Findings:      The examined duodenum was normal.      The stomach was  normal.      The cardia and gastric fundus were normal on retroflexion.      One benign-appearing, intrinsic mild stenosis was found at the       gastroesophageal junction. This stenosis measured 1.6 cm (inner       diameter). The stenosis was traversed. A TTS dilator was passed through       the scope. Dilation with a 15-16.5-18 mm balloon dilator was performed       to 18 mm. The dilation site was examined and showed no change.      Normal mucosa was found in the entire esophagus. Biopsies were taken       with a cold forceps for histology. Impression:            - Normal examined duodenum.                        - Normal stomach.                        - Benign-appearing esophageal stenosis. Dilated.                        - Normal mucosa was found in the entire esophagus.                         Biopsied. Recommendation:        - Discharge patient to home (with escort).                        - Resume previous diet.                        - Continue present medications.                        - Await pathology results.                        - Return to my office as previously scheduled. Procedure Code(s):     --- Professional ---                        [phone], Esophagogastroduodenoscopy, flexible,                         transoral; with transendoscopic balloon dilation of                         esophagus (less than 30 mm diameter)  43239, 59, Esophagogastroduodenoscopy, flexible,                         transoral; with biopsy, single or multiple CPT copyright 2019 American Medical Association. All rights reserved. The codes documented in this report are preliminary and upon coder review may  be revised to meet current compliance requirements. Jonathon Bellows, MD Jonathon Bellows MD, MD 02/09/2020 8:21:06 AM This report has been signed electronically. Number of Addenda: 0 Note Initiated On: 02/09/2020 7:06 AM Estimated Blood Loss:  Estimated blood loss: none.       [facility]

## 2020-02-09 NOTE — Anesthesia Preprocedure Evaluation (Signed)
Anesthesia Evaluation  Patient identified by MRN, date of birth, ID band Patient awake    Reviewed: Allergy & Precautions, NPO status , Patient's Chart, lab work & pertinent test results  Airway Mallampati: III       Dental   Pulmonary PE   breath sounds clear to auscultation       Cardiovascular hypertension, Normal cardiovascular exam     Neuro/Psych negative neurological ROS  negative psych ROS   GI/Hepatic Neg liver ROS, GERD  ,  Endo/Other  negative endocrine ROS  Renal/GU negative Renal ROS  Female GU complaint     Musculoskeletal negative musculoskeletal ROS (+)   Abdominal (+) + obese,   Peds negative pediatric ROS (+)  Hematology  (+) anemia ,   Anesthesia Other Findings Past Medical History: No date: Anemia No date: Hypertension No date: Menorrhagia No date: Pulmonary embolism (HCC)  Reproductive/Obstetrics                             Anesthesia Physical Anesthesia Plan  ASA: III  Anesthesia Plan: General   Post-op Pain Management:    Induction: Intravenous  PONV Risk Score and Plan: Propofol infusion  Airway Management Planned: Nasal Cannula  Additional Equipment:   Intra-op Plan:   Post-operative Plan:   Informed Consent: I have reviewed the patients History and Physical, chart, labs and discussed the procedure including the risks, benefits and alternatives for the proposed anesthesia with the patient or authorized representative who has indicated his/her understanding and acceptance.     Dental advisory given  Plan Discussed with: CRNA and Surgeon  Anesthesia Plan Comments:         Anesthesia Quick Evaluation

## 2020-02-10 LAB — SURGICAL PATHOLOGY

## 2020-02-13 ENCOUNTER — Encounter: Payer: Self-pay | Admitting: Gastroenterology

## 2020-02-23 ENCOUNTER — Encounter: Payer: Self-pay | Admitting: Obstetrics and Gynecology

## 2020-04-04 ENCOUNTER — Telehealth: Payer: Self-pay

## 2020-04-04 NOTE — Telephone Encounter (Signed)
Called pt [facility] her provider is out of the office on 3/17 moved pt to  Next open appointment

## 2020-05-03 ENCOUNTER — Encounter: Payer: Self-pay | Admitting: *Deleted

## 2020-05-03 ENCOUNTER — Ambulatory Visit: Payer: BC Managed Care – PPO | Admitting: Gastroenterology

## 2020-05-03 ENCOUNTER — Encounter: Payer: Self-pay | Admitting: Obstetrics and Gynecology

## 2020-05-08 ENCOUNTER — Encounter: Payer: Self-pay | Admitting: Obstetrics and Gynecology

## 2020-05-10 ENCOUNTER — Encounter: Payer: Self-pay | Admitting: Obstetrics and Gynecology

## 2020-06-29 ENCOUNTER — Encounter: Payer: Self-pay | Admitting: Obstetrics and Gynecology

## 2021-03-14 ENCOUNTER — Ambulatory Visit (INDEPENDENT_AMBULATORY_CARE_PROVIDER_SITE_OTHER): Payer: BC Managed Care – PPO | Admitting: Obstetrics and Gynecology

## 2021-03-14 ENCOUNTER — Encounter: Payer: Self-pay | Admitting: Obstetrics and Gynecology

## 2021-03-14 ENCOUNTER — Other Ambulatory Visit (HOSPITAL_COMMUNITY)
Admission: RE | Admit: 2021-03-14 | Discharge: 2021-03-14 | Disposition: A | Discharge status: Home / Self Care | Payer: BC Managed Care – PPO | Source: Ambulatory Visit | Attending: Obstetrics and Gynecology | Admitting: Obstetrics and Gynecology

## 2021-03-14 ENCOUNTER — Other Ambulatory Visit: Payer: Self-pay

## 2021-03-14 VITALS — BP 129/60 | HR 86 | Ht 64.5 in | Wt 298.0 lb

## 2021-03-14 DIAGNOSIS — N898 Other specified noninflammatory disorders of vagina: Secondary | ICD-10-CM | POA: Insufficient documentation

## 2021-03-14 DIAGNOSIS — Z1231 Encounter for screening mammogram for malignant neoplasm of breast: Secondary | ICD-10-CM

## 2021-03-14 DIAGNOSIS — Z124 Encounter for screening for malignant neoplasm of cervix: Secondary | ICD-10-CM | POA: Diagnosis not present

## 2021-03-14 DIAGNOSIS — Z1211 Encounter for screening for malignant neoplasm of colon: Secondary | ICD-10-CM | POA: Diagnosis not present

## 2021-03-14 DIAGNOSIS — Z01419 Encounter for gynecological examination (general) (routine) without abnormal findings: Secondary | ICD-10-CM | POA: Insufficient documentation

## 2021-03-14 NOTE — Progress Notes (Signed)
HPI:      [name] is a 46 y.o. [phone] who LMP was Patient's last menstrual period was 03/13/2021.  Subjective:   She presents today for her annual examination.  She has not had an annual examination Pap smear or mammogram in "several years".  She reports no previous abnormalities.  In 2017 she had a Mirena IUD placed by Dr. Marcelline Mates to control her bleeding.  She states that she does have monthly periods but they are generally 3 to 4 days in length and not heavy.  She recently learned that the IUD was extended to 8 years and was happy with this. She states that over the last week she has noticed an increased white vaginal discharge with odor.  She denies new sexual partners.    Hx: The following portions of the patient's history were reviewed and updated as appropriate:             She  has a past medical history of Anemia, Hypertension, Menorrhagia, and Pulmonary embolism ([place]). She does not have any pertinent problems on file. She  has a past surgical history that includes Cesarean section (1994) and Esophagogastroduodenoscopy (egd) with propofol (N/A, 02/09/2020). Her family history includes Heart attack in her mother; Hypertension in her father, mother, and another family member; Stroke in her mother. She  reports that she has never smoked. She has never used smokeless tobacco. She reports that she does not drink alcohol and does not use drugs. She has a current medication list which includes the following prescription(s): levonorgestrel, famotidine, and ferrous sulfate. She is allergic to pollen extract.       Review of Systems:  Review of Systems  Constitutional: Denied constitutional symptoms, night sweats, recent illness, fatigue, fever, insomnia and weight loss.  Eyes: Denied eye symptoms, eye pain, photophobia, vision change and visual disturbance.  Ears/Nose/Throat/Neck: Denied ear, nose, throat or neck symptoms, hearing loss, nasal discharge, sinus congestion and sore  throat.  Cardiovascular: Denied cardiovascular symptoms, arrhythmia, chest pain/pressure, edema, exercise intolerance, orthopnea and palpitations.  Respiratory: Denied pulmonary symptoms, asthma, pleuritic pain, productive sputum, cough, dyspnea and wheezing.  Gastrointestinal: States that she is beginning to again have problems with esophageal constriction.  She plans to make an appointment with her GI doctor.  Genitourinary: See HPI for additional information.  Musculoskeletal: Denied musculoskeletal symptoms, stiffness, swelling, muscle weakness and myalgia.  Dermatologic: Denied dermatology symptoms, rash and scar.  Neurologic: Denied neurology symptoms, dizziness, headache, neck pain and syncope.  Psychiatric: Denied psychiatric symptoms, anxiety and depression.  Endocrine: Denied endocrine symptoms including hot flashes and night sweats.   Meds:   Current Outpatient Medications on File Prior to Visit  Medication Sig Dispense Refill   levonorgestrel (MIRENA) 20 MCG/24HR IUD 1 Intra Uterine Device (1 each total) by Intrauterine route once. 1 each 0   famotidine (PEPCID) 40 MG/5ML suspension Take 5 mLs (40 mg total) by mouth daily. 150 mL 0   ferrous sulfate 325 (65 FE) MG tablet Take 1 tablet (325 mg total) by mouth 2 (two) times daily with a meal. (Patient taking differently: Take 325 mg by mouth 3 (three) times daily with meals. ) 90 tablet 7   No current facility-administered medications on file prior to visit.     Objective:     Vitals:   03/14/21 1017  BP: 129/60  Pulse: 86  SpO2: 96%    Filed Weights   03/14/21 1017  Weight: 298 lb (135.2 kg)  Physical examination General NAD, Conversant  HEENT Atraumatic; Op clear with mmm.  Normo-cephalic. Pupils reactive. Anicteric sclerae  Thyroid/Neck Smooth without nodularity or enlargement. Normal ROM.  Neck Supple.  Skin No rashes, lesions or ulceration. Normal palpated skin turgor. No nodularity.  Breasts: No  masses or discharge.  Symmetric.  No axillary adenopathy.  Lungs: Clear to auscultation.No rales or wheezes. Normal Respiratory effort, no retractions.  Heart: NSR.  No murmurs or rubs appreciated. No periferal edema  Abdomen: Soft.  Non-tender.  No masses.  No HSM. No hernia  Extremities: Moves all appropriately.  Normal ROM for age. No lymphadenopathy.  Neuro: Oriented to PPT.  Normal mood. Normal affect.     Pelvic:   Vulva: Normal appearance.  No lesions.  Vagina: No lesions or abnormalities noted.  Support: Normal pelvic support.  Urethra No masses tenderness or scarring.  Meatus Normal size without lesions or prolapse.  Cervix: Normal appearance.  No lesions.  Anus: Normal exam.  No lesions.  Perineum: Normal exam.  No lesions.        Bimanual   Uterus: Normal size.  Non-tender.  Mobile.  AV.  Adnexae: No masses.  Non-tender to palpation.  Cul-de-sac: Negative for abnormality.   Exam limited by patient body habitus  Assessment:    [id number] Patient Active Problem List   Diagnosis Date Noted   Morbid obesity with BMI of 60.0-69.9, adult ([place]) 04/18/2015   Abnormal uterine bleeding 04/18/2015   Anemia 04/18/2015   Pulmonary embolism ([place]) 03/11/2015     1. Encounter for well woman exam with routine gynecological exam   2. Cervical cancer screening   3. Colon cancer screening   4. Encounter for screening mammogram for malignant neoplasm of breast   5. Vaginal discharge        Plan:            1.  Basic Screening Recommendations The basic screening recommendations for asymptomatic women were discussed with the patient during her visit.  The age-appropriate recommendations were discussed with her and the rational for the tests reviewed.  When I am informed by the patient that another primary care physician has previously obtained the age-appropriate tests and they are up-to-date, only outstanding tests are ordered and referrals given as necessary.  Abnormal results of  tests will be discussed with her when all of her results are completed.  Routine preventative health maintenance measures emphasized: Exercise/Diet/Weight control, Tobacco Warnings, Alcohol/Substance use risks and Stress Management Pap performed-lab work ordered-mammogram ordered. 2.  Nuswab performed. Orders Orders Placed This Encounter  Procedures   MM 3D SCREEN BREAST BILATERAL   HIV antibody (with reflex)   Basic metabolic panel   CBC   Cologuard   Hemoglobin A1c   TSH    No orders of the defined types were placed in this encounter.         F/U  Return in about 1 year (around 03/14/2022) for Annual Physical, We will contact her with any abnormal test results.  Finis Bud, M.D. 03/14/2021 11:15 AM

## 2021-03-15 LAB — CERVICOVAGINAL ANCILLARY ONLY
Bacterial Vaginitis (gardnerella): POSITIVE — AB
Candida Glabrata: NEGATIVE
Candida Vaginitis: NEGATIVE
Chlamydia: NEGATIVE
Comment: NEGATIVE
Comment: NEGATIVE
Comment: NEGATIVE
Comment: NEGATIVE
Comment: NEGATIVE
Comment: NORMAL
Neisseria Gonorrhea: NEGATIVE
Trichomonas: POSITIVE — AB

## 2021-03-15 LAB — CYTOLOGY - PAP
Comment: NEGATIVE
Diagnosis: UNDETERMINED — AB
High risk HPV: NEGATIVE

## 2021-03-17 NOTE — Progress Notes (Signed)
Elica: This is considered a NEGATIVE pap. I would recommend a re-check in 1 year instead of the usual 3 years.

## 2021-03-18 ENCOUNTER — Other Ambulatory Visit: Payer: BC Managed Care – PPO

## 2021-04-29 ENCOUNTER — Other Ambulatory Visit: Payer: Self-pay

## 2021-04-29 DIAGNOSIS — A599 Trichomoniasis, unspecified: Secondary | ICD-10-CM

## 2021-04-29 DIAGNOSIS — B9689 Other specified bacterial agents as the cause of diseases classified elsewhere: Secondary | ICD-10-CM

## 2021-04-29 MED ORDER — METRONIDAZOLE 500 MG PO TABS: 500.0000 mg | ORAL_TABLET | Freq: Two times a day (BID) | ORAL | 1 refills | Status: DC

## 2021-05-31 LAB — COLOGUARD

## 2021-06-20 DIAGNOSIS — Z01419 Encounter for gynecological examination (general) (routine) without abnormal findings: Secondary | ICD-10-CM | POA: Diagnosis not present

## 2021-07-05 LAB — COLOGUARD: COLOGUARD: NEGATIVE

## 2021-07-09 ENCOUNTER — Other Ambulatory Visit: Payer: Self-pay

## 2021-07-09 ENCOUNTER — Encounter: Payer: Self-pay | Admitting: Gastroenterology

## 2021-07-09 ENCOUNTER — Ambulatory Visit (INDEPENDENT_AMBULATORY_CARE_PROVIDER_SITE_OTHER): Payer: BC Managed Care – PPO | Admitting: Gastroenterology

## 2021-07-09 VITALS — BP 127/84 | HR 80 | Temp 99.1°F | Ht 65.0 in | Wt 294.0 lb

## 2021-07-09 DIAGNOSIS — K921 Melena: Secondary | ICD-10-CM | POA: Diagnosis not present

## 2021-07-09 MED ORDER — NA SULFATE-K SULFATE-MG SULF 17.5-3.13-1.6 GM/177ML PO SOLN: 354.0000 mL | Freq: Once | ORAL | 0 refills | Status: AC

## 2021-07-09 NOTE — Patient Instructions (Signed)

## 2021-07-09 NOTE — Progress Notes (Signed)
?  ?Jonathon Bellows MD, MRCP(U.K) ?[address]  ?[address]  ?[place], [place] [address]  ?Main: [phone]  ?Fax: [phone] ? ? ?Primary Care Physician: Tawni Millers, MD ? ?Primary Gastroenterologist:  Dr. Jonathon Bellows  ? ?Chief Complaint  ?Patient presents with  ? Blood In Stools  ? ? ?HPI: Jean Morris is a 46 y.o. female ? ? ?Summary of history : ? ?She was seen back in 2021 for dysphagia.  At that point of time she went an upper endoscopy and a Schatzki's ring was dilated to 18 mm.She is presently being referred for blood in the stool.  Cologuard test negative ?Denies any issues with swallowing presently states that she has had streaks of blood seen on the tissue paper on 1 or 2 occasions recently.  Associated with the passage of hard stools ?Marland Kitchen  She recollects that she has had a colonoscopy some years back but not recently.  No change in bowel habits.  No change in the shape of her stool.  No unintentional weight loss.  No perianal itching.  She states she has had menorrhagia in the past was placed on iron pills for anemia. ?Current Outpatient Medications  ?Medication Sig Dispense Refill  ? ferrous sulfate 325 (65 FE) MG tablet Take 1 tablet (325 mg total) by mouth 2 (two) times daily with a meal. (Patient taking differently: Take 325 mg by mouth 3 (three) times daily with meals.) 90 tablet 7  ? levonorgestrel (MIRENA) 20 MCG/24HR IUD 1 Intra Uterine Device (1 each total) by Intrauterine route once. 1 each 0  ? famotidine (PEPCID) 40 MG/5ML suspension Take 5 mLs (40 mg total) by mouth daily. (Patient not taking: Reported on 07/09/2021) 150 mL 0  ? ?No current facility-administered medications for this visit.  ? ? ?Allergies as of 07/09/2021 - Review Complete 07/09/2021  ?Allergen Reaction Noted  ? Pollen extract Nausea And Vomiting 05/28/2016  ? ? ?ROS: ? ?General: Negative for anorexia, weight loss, fever, chills, fatigue, weakness. ?ENT: Negative for hoarseness, difficulty swallowing , nasal  congestion. ?CV: Negative for chest pain, angina, palpitations, dyspnea on exertion, peripheral edema.  ?Respiratory: Negative for dyspnea at rest, dyspnea on exertion, cough, sputum, wheezing.  ?GI: See history of present illness. ?GU:  Negative for dysuria, hematuria, urinary incontinence, urinary frequency, nocturnal urination.  ?Endo: Negative for unusual weight change.  ?  ?Physical Examination: ? ? Ht 5' 5"  (1.651 m)   Wt 294 lb (133.4 kg)   BMI 48.92 kg/m?  ? ?General: Well-nourished, well-developed in no acute distress.  ?Eyes: No icterus. Conjunctivae pink. ?Extremities: No lower extremity edema. No clubbing or deformities. ?Neuro: Alert and oriented x 3.  Grossly intact. ?Skin: Warm and dry, no jaundice.   ?Psych: Alert and cooperative, normal mood and affect. ? ? ?Imaging Studies: ?No results found. ? ?Assessment and Plan:  ? ?Jean Morris is a 46 y.o. y/o female referred for blood in the stool.  Very likely hemorrhoidal ? ?Plan ?1.  Diagnostic colonoscopy ?2.  If has hemorrhoids and if she is symptomatic she will return back to the office for banding of the hemorrhoids.  Explained about conservative management of internal hemorrhoids including perianal hygiene, high-fiber diet and patient information has been provided. ? ?I have discussed alternative options, risks & benefits,  which include, but are not limited to, bleeding, infection, perforation,respiratory complication & drug reaction.  The patient agrees with this plan & written consent will be obtained.   ? ? ?Dr Bailey Mech  Vicente Males  MD,MRCP [facility]) ?Follow up in as needed  ?

## 2021-07-16 ENCOUNTER — Other Ambulatory Visit: Payer: Self-pay | Admitting: Internal Medicine

## 2021-08-05 ENCOUNTER — Encounter: Payer: Self-pay | Admitting: Gastroenterology

## 2021-08-06 ENCOUNTER — Ambulatory Visit: Payer: BC Managed Care – PPO | Admitting: Certified Registered Nurse Anesthetist

## 2021-08-06 ENCOUNTER — Ambulatory Visit
Admission: RE | Admit: 2021-08-06 | Discharge: 2021-08-06 | Disposition: A | Discharge status: Home / Self Care | Payer: BC Managed Care – PPO | Attending: Gastroenterology | Admitting: Gastroenterology

## 2021-08-06 ENCOUNTER — Encounter: Payer: Self-pay | Admitting: Gastroenterology

## 2021-08-06 ENCOUNTER — Encounter
Admission: RE | Disposition: A | Discharge status: Home / Self Care | Payer: Self-pay | Source: Home / Self Care | Attending: Gastroenterology

## 2021-08-06 DIAGNOSIS — D649 Anemia, unspecified: Secondary | ICD-10-CM | POA: Diagnosis not present

## 2021-08-06 DIAGNOSIS — I10 Essential (primary) hypertension: Secondary | ICD-10-CM | POA: Diagnosis not present

## 2021-08-06 DIAGNOSIS — Z86711 Personal history of pulmonary embolism: Secondary | ICD-10-CM | POA: Insufficient documentation

## 2021-08-06 DIAGNOSIS — Z6841 Body Mass Index (BMI) 40.0 and over, adult: Secondary | ICD-10-CM | POA: Insufficient documentation

## 2021-08-06 DIAGNOSIS — K5730 Diverticulosis of large intestine without perforation or abscess without bleeding: Secondary | ICD-10-CM | POA: Insufficient documentation

## 2021-08-06 DIAGNOSIS — K921 Melena: Secondary | ICD-10-CM | POA: Insufficient documentation

## 2021-08-06 DIAGNOSIS — K640 First degree hemorrhoids: Secondary | ICD-10-CM | POA: Diagnosis not present

## 2021-08-06 HISTORY — PX: COLONOSCOPY WITH PROPOFOL: SHX5780

## 2021-08-06 LAB — POCT PREGNANCY, URINE: Preg Test, Ur: NEGATIVE

## 2021-08-06 SURGERY — COLONOSCOPY WITH PROPOFOL
Anesthesia: General

## 2021-08-06 MED ORDER — LIDOCAINE HCL (CARDIAC) PF 100 MG/5ML IV SOSY
PREFILLED_SYRINGE | INTRAVENOUS | Status: DC | PRN
  Administered 2021-08-06: 50 mg via INTRAVENOUS

## 2021-08-06 MED ORDER — SODIUM CHLORIDE 0.9 % IV SOLN
INTRAVENOUS | Status: DC
  Administered 2021-08-06: 1000 mL via INTRAVENOUS

## 2021-08-06 MED ORDER — PROPOFOL 10 MG/ML IV BOLUS
INTRAVENOUS | Status: DC | PRN
  Administered 2021-08-06: 20 mg via INTRAVENOUS
  Administered 2021-08-06: 60 mg via INTRAVENOUS
  Administered 2021-08-06: 20 mg via INTRAVENOUS

## 2021-08-06 MED ORDER — PROPOFOL 500 MG/50ML IV EMUL
INTRAVENOUS | Status: DC | PRN
  Administered 2021-08-06: 150 ug/kg/min via INTRAVENOUS

## 2021-08-06 NOTE — Anesthesia Procedure Notes (Signed)
Procedure Name: MAC Date/Time: 08/06/2021 9:10 AM  Performed by: Tollie Eth, CRNAPre-anesthesia Checklist: Patient identified, Emergency Drugs available, Suction available and Patient being monitored Patient Re-evaluated:Patient Re-evaluated prior to induction Oxygen Delivery Method: Nasal cannula Induction Type: IV induction Placement Confirmation: positive ETCO2

## 2021-08-06 NOTE — Op Note (Signed)
[facility] Gastroenterology Patient Name: Jean Morris Procedure Date: 08/06/2021 9:07 AM MRN: [id number] Account #: [id number] Date of Birth: 07/24/1975 Admit Type: Outpatient Age: 46 Room: [facility] ENDO ROOM 2 Gender: Female Note Status: Finalized Instrument Name: Jasper Riling [id number] Procedure:             Colonoscopy Indications:           Rectal bleeding Providers:             Jonathon Bellows MD, MD Referring MD:          Dianah Field. Mable Fill, MD (Referring MD) Medicines:             Monitored Anesthesia Care Complications:         No immediate complications. Procedure:             Pre-Anesthesia Assessment:                        - Prior to the procedure, a History and Physical was                         performed, and patient medications, allergies and                         sensitivities were reviewed. The patient's tolerance                         of previous anesthesia was reviewed.                        - The risks and benefits of the procedure and the                         sedation options and risks were discussed with the                         patient. All questions were answered and informed                         consent was obtained.                        - The risks and benefits of the procedure and the                         sedation options and risks were discussed with the                         patient. All questions were answered and informed                         consent was obtained.                        - ASA Grade Assessment: II - A patient with mild                         systemic disease.                        After obtaining informed consent,  the colonoscope was                         passed under direct vision. Throughout the procedure,                         the patient's blood pressure, pulse, and oxygen                         saturations were monitored continuously. The                         Colonoscope was introduced  through the anus and                         advanced to the the cecum, identified by the                         appendiceal orifice. The colonoscopy was performed                         with ease. The patient tolerated the procedure well.                         The quality of the bowel preparation was excellent. Findings:      The perianal and digital rectal examinations were normal.      Multiple small-mouthed diverticula were found in the sigmoid colon.      Non-bleeding internal hemorrhoids were found during retroflexion. The       hemorrhoids were medium-sized and Grade I (internal hemorrhoids that do       not prolapse).      The exam was otherwise without abnormality on direct and retroflexion       views. Impression:            - Diverticulosis in the sigmoid colon.                        - Non-bleeding internal hemorrhoids.                        - The examination was otherwise normal on direct and                         retroflexion views.                        - No specimens collected. Recommendation:        - Discharge patient to home (with escort).                        - Resume previous diet.                        - Continue present medications.                        - Repeat colonoscopy in 10 years for screening                         purposes.                        -  Return to my office PRN. Procedure Code(s):     --- Professional ---                        [phone], Colonoscopy, flexible; diagnostic, including                         collection of specimen(s) by brushing or washing, when                         performed (separate procedure) Diagnosis Code(s):     --- Professional ---                        K64.0, First degree hemorrhoids                        K62.5, Hemorrhage of anus and rectum                        K57.30, Diverticulosis of large intestine without                         perforation or abscess without bleeding CPT copyright 2019 American  Medical Association. All rights reserved. The codes documented in this report are preliminary and upon coder review may  be revised to meet current compliance requirements. Jonathon Bellows, MD Jonathon Bellows MD, MD 08/06/2021 9:26:55 AM This report has been signed electronically. Number of Addenda: 0 Note Initiated On: 08/06/2021 9:07 AM Scope Withdrawal Time: 0 hours 6 minutes 12 seconds  Total Procedure Duration: 0 hours 10 minutes 8 seconds  Estimated Blood Loss:  Estimated blood loss: none.      [facility]

## 2021-08-06 NOTE — H&P (Signed)
Jonathon Bellows, MD [address] [place], [place], [place], [address] [address], [place], [place], [place], [address] Phone: [phone]  Fax: [phone]  Primary Care Physician:  Tawni Millers, MD   Pre-Procedure History & Physical: HPI:  Jean Morris is a 46 y.o. female is here for an colonoscopy.   Past Medical History:  Diagnosis Date   Anemia    Hypertension    Menorrhagia    Pulmonary embolism ([place])     Past Surgical History:  Procedure Laterality Date   CESAREAN SECTION  1994   ESOPHAGOGASTRODUODENOSCOPY (EGD) WITH PROPOFOL N/A 02/09/2020   Procedure: ESOPHAGOGASTRODUODENOSCOPY (EGD) WITH PROPOFOL;  Surgeon: Jonathon Bellows, MD;  Location: [facility] ENDOSCOPY;  Service: Gastroenterology;  Laterality: N/A;    Prior to Admission medications   Medication Sig Start Date End Date Taking? Authorizing Provider  famotidine (PEPCID) 40 MG/5ML suspension Take 5 mLs (40 mg total) by mouth daily. Patient not taking: Reported on 07/09/2021 01/08/20 07/09/21  Marlana Salvage, PA  ferrous sulfate 325 (65 FE) MG tablet Take 1 tablet (325 mg total) by mouth 2 (two) times daily with a meal. Patient taking differently: Take 325 mg by mouth 3 (three) times daily with meals. 05/28/16 07/09/21  Tawni Millers, MD  levonorgestrel (MIRENA) 20 MCG/24HR IUD 1 Intra Uterine Device (1 each total) by Intrauterine route once. 04/18/15   Rubie Maid, MD    Allergies as of 07/09/2021 - Review Complete 07/09/2021  Allergen Reaction Noted   Pollen extract Nausea And Vomiting 05/28/2016    Family History  Problem Relation Age of Onset   Hypertension Mother    Stroke Mother    Heart attack Mother    Hypertension Father    Hypertension Other     Social History   Socioeconomic History   Marital status: Single    Spouse name: Not on file   Number of children: Not on file   Years of education: Not on file   Highest education level: Not on file  Occupational History   Not on file   Tobacco Use   Smoking status: Never   Smokeless tobacco: Never  Vaping Use   Vaping Use: Never used  Substance and Sexual Activity   Alcohol use: No   Drug use: No   Sexual activity: Yes    Birth control/protection: I.U.D.  Other Topics Concern   Not on file  Social History Narrative   Not on file   Social Determinants of Health   Financial Resource Strain: Not on file  Food Insecurity: Not on file  Transportation Needs: Not on file  Physical Activity: Not on file  Stress: Not on file  Social Connections: Not on file  Intimate Partner Violence: Not on file    Review of Systems: See HPI, otherwise negative ROS  Physical Exam: BP 131/80   Pulse (!) 101   Temp 97.7 F (36.5 C) (Temporal)   Resp 18   Ht 5' 5"  (1.651 m)   Wt 133.6 kg   SpO2 100%   BMI 49.02 kg/m  General:   Alert,  pleasant and cooperative in NAD Head:  Normocephalic and atraumatic. Neck:  Supple; no masses or thyromegaly. Lungs:  Clear throughout to auscultation, normal respiratory effort.    Heart:  +S1, +S2, Regular rate and rhythm, No edema. Abdomen:  Soft, nontender and nondistended. Normal bowel sounds, without guarding, and without rebound.   Neurologic:  Alert and  oriented x4;  grossly normal neurologically.  Impression/Plan: Jean Morris is here for an colonoscopy to be performed for blood in stool .  Risks, benefits, limitations, and alternatives regarding  colonoscopy have been reviewed with the patient.  Questions have been answered.  All parties agreeable.   Jonathon Bellows, MD  08/06/2021, 8:57 AM

## 2021-08-06 NOTE — Anesthesia Postprocedure Evaluation (Signed)
Anesthesia Post Note  Patient: Jean Morris  Procedure(s) Performed: COLONOSCOPY WITH PROPOFOL  Patient location during evaluation: PACU Anesthesia Type: General Level of consciousness: awake and oriented Pain management: satisfactory to patient Vital Signs Assessment: post-procedure vital signs reviewed and stable Respiratory status: nonlabored ventilation and respiratory function stable Cardiovascular status: stable Anesthetic complications: no   No notable events documented.   Last Vitals:  Vitals:   08/06/21 0937 08/06/21 0947  BP: 114/76 115/74  Pulse:    Resp:    Temp:    SpO2:      Last Pain:  Vitals:   08/06/21 0957  TempSrc:   PainSc: 0-No pain                 VAN STAVEREN,[name]

## 2021-08-06 NOTE — Transfer of Care (Signed)
Immediate Anesthesia Transfer of Care Note  Patient: Jean Morris  Procedure(s) Performed: COLONOSCOPY WITH PROPOFOL  Patient Location: Endoscopy Unit  Anesthesia Type:General  Level of Consciousness: awake and alert   Airway & Oxygen Therapy: Patient Spontanous Breathing  Post-op Assessment: Report given to RN and Post -op Vital signs reviewed and stable  Post vital signs: Reviewed and stable  Last Vitals:  Vitals Value Taken Time  BP 104/75 08/06/21 0928  Temp 36.1 C 08/06/21 0927  Pulse 92 08/06/21 0927  Resp 19 08/06/21 0928  SpO2 100 % 08/06/21 0927  Vitals shown include unvalidated device data.  Last Pain:  Vitals:   08/06/21 0927  TempSrc: Temporal  PainSc: Asleep         Complications: No notable events documented.

## 2021-08-06 NOTE — Anesthesia Preprocedure Evaluation (Signed)
Anesthesia Evaluation  Patient identified by MRN, date of birth, ID band Patient awake    Reviewed: Allergy & Precautions, NPO status , Patient's Chart, lab work & pertinent test results  Airway Mallampati: II  TM Distance: >3 FB Neck ROM: Full    Dental  (+) Lower Dentures, Upper Dentures   Pulmonary neg pulmonary ROS,  HX of PE...   Pulmonary exam normal  + decreased breath sounds      Cardiovascular Exercise Tolerance: Good hypertension, Pt. on medications negative cardio ROS Normal cardiovascular exam Rhythm:Regular     Neuro/Psych Anxiety negative neurological ROS  negative psych ROS   GI/Hepatic negative GI ROS, Neg liver ROS,   Endo/Other  negative endocrine ROSMorbid obesity  Renal/GU negative Renal ROS  negative genitourinary   Musculoskeletal negative musculoskeletal ROS (+)   Abdominal (+) + obese,   Peds negative pediatric ROS (+)  Hematology negative hematology ROS (+) Blood dyscrasia, anemia ,   Anesthesia Other Findings Past Medical History: No date: Anemia No date: Hypertension No date: Menorrhagia No date: Pulmonary embolism [facility])  Past Surgical History: 1994: CESAREAN SECTION 02/09/2020: ESOPHAGOGASTRODUODENOSCOPY (EGD) WITH PROPOFOL; N/A     Comment:  Procedure: ESOPHAGOGASTRODUODENOSCOPY (EGD) WITH               PROPOFOL;  Surgeon: Jonathon Bellows, MD;  Location: [facility]               ENDOSCOPY;  Service: Gastroenterology;  Laterality: N/A;     Reproductive/Obstetrics negative OB ROS                             Anesthesia Physical Anesthesia Plan  ASA: 3  Anesthesia Plan: General   Post-op Pain Management:    Induction: Intravenous  PONV Risk Score and Plan: Propofol infusion and TIVA  Airway Management Planned: Natural Airway  Additional Equipment:   Intra-op Plan:   Post-operative Plan:   Informed Consent: I have reviewed the patients History and  Physical, chart, labs and discussed the procedure including the risks, benefits and alternatives for the proposed anesthesia with the patient or authorized representative who has indicated his/her understanding and acceptance.     Dental Advisory Given  Plan Discussed with: CRNA and Surgeon  Anesthesia Plan Comments:         Anesthesia Quick Evaluation

## 2021-08-07 ENCOUNTER — Encounter: Payer: Self-pay | Admitting: Gastroenterology

## 2021-08-18 ENCOUNTER — Telehealth: Payer: BC Managed Care – PPO | Admitting: Family

## 2021-08-18 DIAGNOSIS — R399 Unspecified symptoms and signs involving the genitourinary system: Secondary | ICD-10-CM | POA: Diagnosis not present

## 2021-08-18 MED ORDER — CEPHALEXIN 500 MG PO CAPS: 500.0000 mg | ORAL_CAPSULE | Freq: Two times a day (BID) | ORAL | 0 refills | Status: DC

## 2021-09-30 ENCOUNTER — Other Ambulatory Visit: Payer: Self-pay | Admitting: Family

## 2021-09-30 DIAGNOSIS — R399 Unspecified symptoms and signs involving the genitourinary system: Secondary | ICD-10-CM

## 2021-10-22 ENCOUNTER — Other Ambulatory Visit: Payer: Self-pay

## 2021-10-22 DIAGNOSIS — Z01419 Encounter for gynecological examination (general) (routine) without abnormal findings: Secondary | ICD-10-CM

## 2021-10-22 NOTE — Progress Notes (Signed)
Patient requesting annual labs, states she never came back after annual but wants them now.

## 2021-10-23 ENCOUNTER — Other Ambulatory Visit: Payer: BC Managed Care – PPO

## 2021-10-23 ENCOUNTER — Telehealth: Payer: BC Managed Care – PPO | Admitting: Family

## 2021-10-23 DIAGNOSIS — R399 Unspecified symptoms and signs involving the genitourinary system: Secondary | ICD-10-CM

## 2021-10-23 DIAGNOSIS — Z01419 Encounter for gynecological examination (general) (routine) without abnormal findings: Secondary | ICD-10-CM | POA: Diagnosis not present

## 2021-10-23 MED ORDER — SULFAMETHOXAZOLE-TRIMETHOPRIM 800-160 MG PO TABS: 1.0000 | ORAL_TABLET | Freq: Two times a day (BID) | ORAL | 0 refills | Status: DC

## 2021-10-23 NOTE — Progress Notes (Signed)
Virtual Visit Consent   Jean Morris, you are scheduled for a virtual visit with a [facility] provider today. Just as with appointments in the office, your consent must be obtained to participate. Your consent will be active for this visit and any virtual visit you may have with one of our providers in the next 365 days. If you have a MyChart account, a copy of this consent can be sent to you electronically.  As this is a virtual visit, video technology does not allow for your provider to perform a traditional examination. This may limit your provider's ability to fully assess your condition. If your provider identifies any concerns that need to be evaluated in person or the need to arrange testing (such as labs, EKG, etc.), we will make arrangements to do so. Although advances in technology are sophisticated, we cannot ensure that it will always work on either your end or our end. If the connection with a video visit is poor, the visit may have to be switched to a telephone visit. With either a video or telephone visit, we are not always able to ensure that we have a secure connection.  By engaging in this virtual visit, you consent to the provision of healthcare and authorize for your insurance to be billed (if applicable) for the services provided during this visit. Depending on your insurance coverage, you may receive a charge related to this service.  I need to obtain your verbal consent now. Are you willing to proceed with your visit today? Rhylei L Mervine has provided verbal consent on 10/23/2021 for a virtual visit (video or telephone). Evelina Dun, FNP  Date: 10/23/2021 12:48 PM  Virtual Visit via Video Note   I, Evelina Dun, connected with  Mekhia L Saltzman  ([id number], [birth year]) on 10/23/21 at 12:45 PM EDT by a video-enabled telemedicine application and verified that I am speaking with the correct person using two identifiers.  Location: Patient: Virtual Visit Location Patient:  Home Provider: Virtual Visit Location Provider: Home Office   I discussed the limitations of evaluation and management by telemedicine and the availability of in person appointments. The patient expressed understanding and agreed to proceed.    History of Present Illness: Jean Morris is a 46 y.o. who identifies as a female who was assigned female at birth, and is being seen today for abdominal pain and urinary frequency.  HPI: Urinary Frequency  This is a new problem. The current episode started yesterday. The problem occurs intermittently. The problem has been waxing and waning. The quality of the pain is described as aching. The pain is at a severity of 10/10. The pain is mild. Associated symptoms include flank pain, frequency, hesitancy and urgency. Pertinent negatives include no hematuria, nausea or vomiting. She has tried increased fluids for the symptoms. The treatment provided mild relief.    Problems:  Patient Active Problem List   Diagnosis Date Noted   Morbid obesity with BMI of 60.0-69.9, adult ([place]) 04/18/2015   Abnormal uterine bleeding 04/18/2015   Anemia 04/18/2015   Pulmonary embolism ([place]) 03/11/2015    Allergies:  Allergies  Allergen Reactions   Pollen Extract Nausea And Vomiting    Itchy, swelling, watery eyes, runny nose.   Medications:  Current Outpatient Medications:    sulfamethoxazole-trimethoprim (BACTRIM DS) 800-160 MG tablet, Take 1 tablet by mouth 2 (two) times daily., Disp: 14 tablet, Rfl: 0   ferrous sulfate 325 (65 FE) MG tablet, Take 1 tablet (325 mg total) by mouth  2 (two) times daily with a meal. (Patient taking differently: Take 325 mg by mouth 3 (three) times daily with meals.), Disp: 90 tablet, Rfl: 7   levonorgestrel (MIRENA) 20 MCG/24HR IUD, 1 Intra Uterine Device (1 each total) by Intrauterine route once., Disp: 1 each, Rfl: 0  Observations/Objective: Patient is well-developed, well-nourished in no acute distress.  Resting comfortably  at  home.  Head is normocephalic, atraumatic.  No labored breathing.  Speech is clear and coherent with logical content.  Patient is alert and oriented at baseline.    Assessment and Plan: 1. UTI symptoms - sulfamethoxazole-trimethoprim (BACTRIM DS) 800-160 MG tablet; Take 1 tablet by mouth 2 (two) times daily.  Dispense: 14 tablet; Refill: 0  Force fluids AZO over the counter X2 days RTO if symptoms worsen or do not improve     Follow Up Instructions: I discussed the assessment and treatment plan with the patient. The patient was provided an opportunity to ask questions and all were answered. The patient agreed with the plan and demonstrated an understanding of the instructions.  A copy of instructions were sent to the patient via MyChart unless otherwise noted below.    The patient was advised to call back or seek an in-person evaluation if the symptoms worsen or if the condition fails to improve as anticipated.  Time:  I spent 8 minutes with the patient via telehealth technology discussing the above problems/concerns.    Evelina Dun, FNP

## 2021-10-24 LAB — LIPID PANEL
Chol/HDL Ratio: 3 ratio (ref 0.0–4.4)
Cholesterol, Total: 177 mg/dL (ref 100–199)
HDL: 59 mg/dL (ref >39)
LDL Chol Calc (NIH): 103 mg/dL — ABNORMAL HIGH (ref 0–99)
Triglycerides: 83 mg/dL (ref 0–149)
VLDL Cholesterol Cal: 15 mg/dL (ref 5–40)

## 2021-10-24 LAB — BASIC METABOLIC PANEL
BUN/Creatinine Ratio: 10 (ref 9–23)
BUN: 7 mg/dL (ref 6–24)
CO2: 23 mmol/L (ref 20–29)
Calcium: 9.3 mg/dL (ref 8.7–10.2)
Chloride: 102 mmol/L (ref 96–106)
Creatinine, Ser: 0.73 mg/dL (ref 0.57–1.00)
Glucose: 79 mg/dL (ref 70–99)
Potassium: 4.8 mmol/L (ref 3.5–5.2)
Sodium: 139 mmol/L (ref 134–144)
eGFR: 101 mL/min/{1.73_m2} (ref >59)

## 2021-10-24 LAB — HEMOGLOBIN A1C
Est. average glucose Bld gHb Est-mCnc: 111 mg/dL
Hgb A1c MFr Bld: 5.5 % (ref 4.8–5.6)

## 2021-10-24 LAB — CBC
Hematocrit: 27.2 % — ABNORMAL LOW (ref 34.0–46.6)
Hemoglobin: 7.7 g/dL — ABNORMAL LOW (ref 11.1–15.9)
MCH: 16.5 pg — ABNORMAL LOW (ref 26.6–33.0)
MCHC: 28.3 g/dL — ABNORMAL LOW (ref 31.5–35.7)
MCV: 58 fL — ABNORMAL LOW (ref 79–97)
Platelets: 446 10*3/uL (ref 150–450)
RBC: 4.68 x10E6/uL (ref 3.77–5.28)
RDW: 21.3 % — ABNORMAL HIGH (ref 11.7–15.4)
WBC: 5.3 10*3/uL (ref 3.4–10.8)

## 2021-10-24 LAB — HIV ANTIBODY (ROUTINE TESTING W REFLEX): HIV Screen 4th Generation wRfx: NONREACTIVE

## 2021-10-24 LAB — TSH: TSH: 1.91 u[IU]/mL (ref 0.450–4.500)

## 2021-10-25 ENCOUNTER — Other Ambulatory Visit (HOSPITAL_COMMUNITY)
Admission: RE | Admit: 2021-10-25 | Discharge: 2021-10-25 | Disposition: A | Discharge status: Home / Self Care | Payer: BC Managed Care – PPO | Source: Ambulatory Visit | Attending: Obstetrics and Gynecology | Admitting: Obstetrics and Gynecology

## 2021-10-25 ENCOUNTER — Ambulatory Visit (INDEPENDENT_AMBULATORY_CARE_PROVIDER_SITE_OTHER): Payer: BC Managed Care – PPO | Admitting: Obstetrics and Gynecology

## 2021-10-25 VITALS — BP 118/76 | HR 84 | Ht 65.0 in | Wt 287.2 lb

## 2021-10-25 DIAGNOSIS — N898 Other specified noninflammatory disorders of vagina: Secondary | ICD-10-CM

## 2021-10-25 DIAGNOSIS — R350 Frequency of micturition: Secondary | ICD-10-CM | POA: Insufficient documentation

## 2021-10-25 DIAGNOSIS — N760 Acute vaginitis: Secondary | ICD-10-CM | POA: Diagnosis not present

## 2021-10-25 LAB — POCT URINALYSIS DIPSTICK
Bilirubin, UA: NEGATIVE
Glucose, UA: NEGATIVE
Ketones, UA: NEGATIVE
Protein, UA: POSITIVE — AB
Spec Grav, UA: 1.02 (ref 1.010–1.025)
Urobilinogen, UA: 0.2 E.U./dL
pH, UA: 7.5 (ref 5.0–8.0)

## 2021-10-25 MED ORDER — BORIC ACID CRYS
600.0000 mg | CRYSTALS | 5 refills | Status: DC
Start: 2021-10-28 — End: 2022-01-21

## 2021-10-25 MED ORDER — NITROFURANTOIN MONOHYD MACRO 100 MG PO CAPS: 100.0000 mg | ORAL_CAPSULE | Freq: Two times a day (BID) | ORAL | 0 refills | Status: DC

## 2021-10-25 MED ORDER — BORIC ACID CRYS: 600.0000 mg | CRYSTALS | 5 refills | Status: DC

## 2021-10-25 NOTE — Progress Notes (Signed)
Jean Morris is a 46 y.o. female who complains of urinary frequency, urgency and dysuria with lower back pain.Patient presents today as well for BV,Yeast and Trich testing via self swab culture. She states STD exposure A few month ago she would just like to make sure [facility] has resolved Symptoms include pain with intercourse and vaginal itching,feels like it may be yeast or bv as she also has a odor . Self swab culture preformed.UA sent to Testing, Macrobid sent due to felling "funny" on bactrim medication. Sample given and noted in chart for boric acid.

## 2021-10-27 LAB — URINE CULTURE

## 2021-10-28 NOTE — Progress Notes (Signed)
Based on these results, please have her schedule a visit with me. She is significantly anemic.

## 2021-11-01 ENCOUNTER — Other Ambulatory Visit: Payer: Self-pay | Admitting: Obstetrics and Gynecology

## 2021-11-01 DIAGNOSIS — N760 Acute vaginitis: Secondary | ICD-10-CM

## 2021-11-01 LAB — CERVICOVAGINAL ANCILLARY ONLY
Bacterial Vaginitis (gardnerella): POSITIVE — AB
Comment: NEGATIVE
Comment: NEGATIVE
Comment: NEGATIVE
Comment: NEGATIVE

## 2021-11-01 MED ORDER — METRONIDAZOLE 500 MG PO TABS: 500.0000 mg | ORAL_TABLET | Freq: Two times a day (BID) | ORAL | 0 refills | Status: DC

## 2021-11-13 ENCOUNTER — Ambulatory Visit (INDEPENDENT_AMBULATORY_CARE_PROVIDER_SITE_OTHER): Payer: BC Managed Care – PPO | Admitting: Obstetrics and Gynecology

## 2021-11-13 ENCOUNTER — Encounter: Payer: Self-pay | Admitting: Obstetrics and Gynecology

## 2021-11-13 VITALS — Ht 65.0 in | Wt 288.1 lb

## 2021-11-13 DIAGNOSIS — D508 Other iron deficiency anemias: Secondary | ICD-10-CM

## 2021-11-13 NOTE — Progress Notes (Signed)
Patient presents today to discuss recent lab findings. She states over the past few months with her IUD she has had irregular bleeding on and off. She states concerns regarding taking flagyl along with her iron supplements. No additional concerns.

## 2021-11-13 NOTE — Progress Notes (Signed)
HPI:      Jean Morris is a 46 y.o. [phone] who LMP was No LMP recorded (lmp unknown).  Subjective:   She presents today for review of her lab work and complaint of chronic anemia.  Patient has had irregular bleeding over the last several months sometimes heavy for a few days but mostly lighter.  She had recent blood work showed her hemoglobin to be 7.7.  She has been anemic for several years despite current use of Mirena IUD.  She reports she is asymptomatic with this H&H. She has also recently been diagnosed with BV but decided not to take the Flagyl and iron supplementation concurrently. We discussed her diet and she states that she really only eats 3 things.  Chicken nuggets, sausage, and cereal. She has recently restarted taking oral iron 1 time per day after finding that she was anemic.    Hx: The following portions of the patient's history were reviewed and updated as appropriate:             She  has a past medical history of Anemia, Hypertension, Menorrhagia, and Pulmonary embolism ([place]). She does not have any pertinent problems on file. She  has a past surgical history that includes Cesarean section (1994); Esophagogastroduodenoscopy (egd) with propofol (N/A, 02/09/2020); and Colonoscopy with propofol (N/A, 08/06/2021). Her family history includes Heart attack in her mother; Hypertension in her father, mother, and another family member; Stroke in her mother. She  reports that she has never smoked. She has never used smokeless tobacco. She reports that she does not drink alcohol and does not use drugs. She has a current medication list which includes the following prescription(s): boric acid, levonorgestrel, metronidazole, and ferrous sulfate. She is allergic to pollen extract.       Review of Systems:  Review of Systems  Constitutional: Denied constitutional symptoms, night sweats, recent illness, fatigue, fever, insomnia and weight loss.  Eyes: Denied eye symptoms, eye pain,  photophobia, vision change and visual disturbance.  Ears/Nose/Throat/Neck: Denied ear, nose, throat or neck symptoms, hearing loss, nasal discharge, sinus congestion and sore throat.  Cardiovascular: Denied cardiovascular symptoms, arrhythmia, chest pain/pressure, edema, exercise intolerance, orthopnea and palpitations.  Respiratory: Denied pulmonary symptoms, asthma, pleuritic pain, productive sputum, cough, dyspnea and wheezing.  Gastrointestinal: Denied, gastro-esophageal reflux, melena, nausea and vomiting.  Genitourinary: See HPI for additional information.  Musculoskeletal: Denied musculoskeletal symptoms, stiffness, swelling, muscle weakness and myalgia.  Dermatologic: Denied dermatology symptoms, rash and scar.  Neurologic: Denied neurology symptoms, dizziness, headache, neck pain and syncope.  Psychiatric: Denied psychiatric symptoms, anxiety and depression.  Endocrine: Denied endocrine symptoms including hot flashes and night sweats.   Meds:   Current Outpatient Medications on File Prior to Visit  Medication Sig Dispense Refill   Boric Acid [place] 600 mg vaginally 2 (two) times a week. 500 g 5   levonorgestrel (MIRENA) 20 MCG/24HR IUD 1 Intra Uterine Device (1 each total) by Intrauterine route once. 1 each 0   metroNIDAZOLE (FLAGYL) 500 MG tablet Take 1 tablet (500 mg total) by mouth 2 (two) times daily. 14 tablet 0   ferrous sulfate 325 (65 FE) MG tablet Take 1 tablet (325 mg total) by mouth 2 (two) times daily with a meal. (Patient taking differently: Take 325 mg by mouth 3 (three) times daily with meals.) 90 tablet 7   No current facility-administered medications on file prior to visit.      Objective:     There were no vitals filed  for this visit. Filed Weights   11/13/21 0835  Weight: 288 lb 1.6 oz (130.7 kg)              Lab results reviewed  Previous CT scan reviewed          Assessment:    [id number] Patient Active Problem List   Diagnosis Date Noted    Morbid obesity with BMI of 60.0-69.9, adult ([place]) 04/18/2015   Abnormal uterine bleeding 04/18/2015   Anemia 04/18/2015   Pulmonary embolism ([place]) 03/11/2015     1. Other iron deficiency anemia     Despite the fact that she describes most of her bleeding is not heavy it does seem that she has a few heavy days every month and this coupled with poor dietary intake and oral iron has resulted in anemia.  Obviously her Mirena IUD is not working as well as would like it to.  Review of a previous CT shows no evidence of uterine fibroids.   Plan:            1.  Recommend oral iron twice daily.  Discussed iron infusions and patient was not interested.  2.  Patient will restart Flagyl for BV.  3.  Plan follow-up CBC in 6 weeks to see if oral iron has made a difference. Orders Orders Placed This Encounter  Procedures   CBC With Diff/Platelet    No orders of the defined types were placed in this encounter.     F/U  Return in about 7 weeks (around 01/01/2022). I spent 23 minutes involved in the care of this patient preparing to see the patient by obtaining and reviewing her medical history (including labs, imaging tests and prior procedures), documenting clinical information in the electronic health record (EHR), counseling and coordinating care plans, writing and sending prescriptions, ordering tests or procedures and in direct communicating with the patient and medical staff discussing pertinent items from her history and physical exam.  Finis Bud, M.D. 11/13/2021 9:14 AM

## 2021-11-19 ENCOUNTER — Encounter: Payer: Self-pay | Admitting: Obstetrics and Gynecology

## 2021-11-23 ENCOUNTER — Emergency Department
Admission: EM | Admit: 2021-11-23 | Discharge: 2021-11-23 | Disposition: A | Discharge status: Home / Self Care | Payer: BC Managed Care – PPO | Attending: Emergency Medicine | Admitting: Emergency Medicine

## 2021-11-23 ENCOUNTER — Other Ambulatory Visit: Payer: Self-pay

## 2021-11-23 DIAGNOSIS — I10 Essential (primary) hypertension: Secondary | ICD-10-CM | POA: Insufficient documentation

## 2021-11-23 DIAGNOSIS — M5432 Sciatica, left side: Secondary | ICD-10-CM

## 2021-11-23 DIAGNOSIS — M5442 Lumbago with sciatica, left side: Secondary | ICD-10-CM | POA: Insufficient documentation

## 2021-11-23 MED ORDER — MELOXICAM 15 MG PO TABS: 15.0000 mg | ORAL_TABLET | Freq: Every day | ORAL | 11 refills | Status: DC

## 2021-11-23 MED ORDER — KETOROLAC TROMETHAMINE 30 MG/ML IJ SOLN
30.0000 mg | Freq: Once | INTRAMUSCULAR | Status: AC
  Administered 2021-11-23: 30 mg via INTRAMUSCULAR
  Filled 2021-11-23: qty 1

## 2021-11-23 MED ORDER — CYCLOBENZAPRINE HCL 10 MG PO TABS: 10.0000 mg | ORAL_TABLET | Freq: Three times a day (TID) | ORAL | 0 refills | Status: AC | PRN

## 2021-11-23 NOTE — ED Notes (Signed)
Pt to ED for shooting pain from L back down L leg "to my toenails" since 1.5 weeks ago, hx sciatica. Pain worse with sitting. Pt was sitting at work today, pain was unbearable and pt had to come in to get checked.

## 2021-11-23 NOTE — ED Provider Notes (Signed)
[facility] Provider Note    Event Date/Time   First MD Initiated Contact with Patient 11/23/21 1501     (approximate)   History   Chief Complaint Back Pain   HPI Jean Morris is a 46 y.o. female, history of morbid obesity, anemia, hypertension, pulmonary embolism, presents to the emergency department for evaluation of left-sided back pain.  She states that this been going on for the past 2 weeks, associated with pain/tingling radiating into her left lower extremity, particular in the buttocks region and the top of her left foot.  She was evaluated by a provider a few weeks ago where she was tested for urinary tract infection, however this was reportedly negative.  She does state that she has had sciatica in the past, though it has been 10 years since she has had any sciatica type pain.  No recent falls or injuries.  Denies fever/chills, saddle anesthesia, bowel/bladder dysfunction, chest pain, shortness of breath, abdominal pain, flank pain, nausea/vomiting, diarrhea, dysuria, headache, or dizziness/lightheadedness.  History Limitations: No limitations.        Physical Exam  Triage Vital Signs: ED Triage Vitals [11/23/21 1440]  Enc Vitals Group     BP (!) 144/87     Pulse Rate 80     Resp 18     Temp 98 F (36.7 C)     Temp src      SpO2 98 %     Weight      Height      Head Circumference      Peak Flow      Pain Score 8     Pain Loc      Pain Edu?      Excl. in [place]?     Most recent vital signs: Vitals:   11/23/21 1440 11/23/21 1456  BP: (!) 144/87   Pulse: 80 73  Resp: 18   Temp: 98 F (36.7 C)   SpO2: 98% 100%    General: Awake, NAD.  Skin: Warm, dry. No rashes or lesions.  Eyes: PERRL. Conjunctivae normal.  CV: Good peripheral perfusion.  Resp: Normal effort.  Abd: Soft, non-tender. No distention.  Neuro: At baseline. No gross neurological deficits.  Musculoskeletal: Normal ROM of all extremities.  Focused Exam: No  tenderness along the midline spine.  No gross force to the left lower extremity.  Positive straight leg test on the left side.  PMS intact distally.  Physical Exam    ED Results / Procedures / Treatments  Labs (all labs ordered are listed, but only abnormal results are displayed) Labs Reviewed - No data to display   EKG N/A.    RADIOLOGY  ED Provider Interpretation: N/A.  No results found.  PROCEDURES:  Critical Care performed: N/A.  Procedures    MEDICATIONS ORDERED IN ED: Medications  ketorolac (TORADOL) 30 MG/ML injection 30 mg (has no administration in time range)     IMPRESSION / MDM / ASSESSMENT AND PLAN / ED COURSE  I reviewed the triage vital signs and the nursing notes.                              Differential diagnosis includes, but is not limited to, lumbar radiculopathy, sciatica, lumbosacral strain, disc herniation.   Assessment/Plan Presentation consistent with sciatica on the left side.  She has not had any traumatic injuries or falls recently.  No urinary symptoms.  History and  physical exam not concerning for cauda equina syndrome.  Symptoms appear mild.  I do not believe that she needs imaging at this time.  We will provide her with prescriptions for meloxicam and cyclobenzaprine to help manage her symptoms.  Provide her with a referral to orthopedics as well to utilize if her symptoms persist after a few weeks.  She was amenable to this plan.  Will discharge.  Provided the patient with anticipatory guidance, return precautions, and educational material. Encouraged the patient to return to the emergency department at any time if they begin to experience any new or worsening symptoms. Patient expressed understanding and agreed with the plan.   Patient's presentation is most consistent with acute, uncomplicated illness.       FINAL CLINICAL IMPRESSION(S) / ED DIAGNOSES   Final diagnoses:  Sciatica of left side     Rx / DC Orders   ED  Discharge Orders          Ordered    meloxicam (MOBIC) 15 MG tablet  Daily        11/23/21 1509    cyclobenzaprine (FLEXERIL) 10 MG tablet  3 times daily PRN        11/23/21 1509             Note:  This document was prepared using Dragon voice recognition software and may include unintentional dictation errors.   Teodoro Spray, [place] 11/23/21 1534    Nance Pear, MD 11/23/21 [phone]

## 2021-11-23 NOTE — ED Triage Notes (Signed)
Pt comes with c/o back pain. Pt states left sided and radiates down leg. Pt states hx of sciatic before.

## 2021-11-23 NOTE — Discharge Instructions (Addendum)
-  You may take the meloxicam as needed for pain.  You may additionally take acetaminophen (Tylenol) as well as needed.  -You may take the cyclobenzaprine as well which may result in some muscle relaxation and help with the pain.  However, use caution as it may make you dizzy/drowsy.  -If your pain fails to improve after a few weeks, please follow-up with the orthopedist within these instructions.  -Return to the emergency department anytime if you begin to experience any new or worsening symptoms.

## 2021-11-24 ENCOUNTER — Telehealth: Payer: Self-pay | Admitting: Obstetrics and Gynecology

## 2021-11-24 NOTE — Telephone Encounter (Signed)
Patient states she is having pain in her lower abdomen and back. Patient states pain sometimes goes down her left leg. Patient states pain is mostly on her left side. Patient states she visited the ER 11/23/2021. Patient states they informed her that it may be sciatic nerve. Patient states her pain level is around a 8. Please advise.

## 2021-11-25 NOTE — Telephone Encounter (Signed)
I will forward this to [name], as this is his patient, I've never seen her.

## 2021-11-29 ENCOUNTER — Other Ambulatory Visit: Payer: Self-pay

## 2021-11-29 ENCOUNTER — Emergency Department
Admission: EM | Admit: 2021-11-29 | Discharge: 2021-11-29 | Disposition: A | Discharge status: Home / Self Care | Payer: BC Managed Care – PPO | Attending: Emergency Medicine | Admitting: Emergency Medicine

## 2021-11-29 DIAGNOSIS — I10 Essential (primary) hypertension: Secondary | ICD-10-CM | POA: Insufficient documentation

## 2021-11-29 DIAGNOSIS — N3001 Acute cystitis with hematuria: Secondary | ICD-10-CM | POA: Insufficient documentation

## 2021-11-29 DIAGNOSIS — M545 Low back pain, unspecified: Secondary | ICD-10-CM | POA: Diagnosis not present

## 2021-11-29 LAB — URINALYSIS, ROUTINE W REFLEX MICROSCOPIC
Bilirubin Urine: NEGATIVE
Glucose, UA: NEGATIVE mg/dL
Ketones, ur: NEGATIVE mg/dL
Nitrite: POSITIVE — AB
Protein, ur: 30 mg/dL — AB
RBC / HPF: >50 RBC/hpf — ABNORMAL HIGH (ref 0–5)
Specific Gravity, Urine: 1.015 (ref 1.005–1.030)
pH: 6 (ref 5.0–8.0)

## 2021-11-29 LAB — CBC WITH DIFFERENTIAL/PLATELET
Abs Immature Granulocytes: 0.03 10*3/uL (ref 0.00–0.07)
Basophils Absolute: 0.1 10*3/uL (ref 0.0–0.1)
Basophils Relative: 1 %
Eosinophils Absolute: 0.2 10*3/uL (ref 0.0–0.5)
Eosinophils Relative: 3 %
HCT: 29.5 % — ABNORMAL LOW (ref 36.0–46.0)
Hemoglobin: 9.2 g/dL — ABNORMAL LOW (ref 12.0–15.0)
Immature Granulocytes: 0 %
Lymphocytes Relative: 33 %
Lymphs Abs: 2.3 10*3/uL (ref 0.7–4.0)
MCH: 18.7 pg — ABNORMAL LOW (ref 26.0–34.0)
MCHC: 31.2 g/dL (ref 30.0–36.0)
MCV: 59.8 fL — ABNORMAL LOW (ref 80.0–100.0)
Monocytes Absolute: 0.4 10*3/uL (ref 0.1–1.0)
Monocytes Relative: 6 %
Neutro Abs: 4.1 10*3/uL (ref 1.7–7.7)
Neutrophils Relative %: 57 %
Platelets: 418 10*3/uL — ABNORMAL HIGH (ref 150–400)
RBC: 4.93 MIL/uL (ref 3.87–5.11)
RDW: 28.1 % — ABNORMAL HIGH (ref 11.5–15.5)
Smear Review: NORMAL
WBC: 6.8 10*3/uL (ref 4.0–10.5)
nRBC: 0 % (ref 0.0–0.2)

## 2021-11-29 LAB — BASIC METABOLIC PANEL
Anion gap: 4 — ABNORMAL LOW (ref 5–15)
BUN: 9 mg/dL (ref 6–20)
CO2: 27 mmol/L (ref 22–32)
Calcium: 9.3 mg/dL (ref 8.9–10.3)
Chloride: 105 mmol/L (ref 98–111)
Creatinine, Ser: 0.76 mg/dL (ref 0.44–1.00)
GFR, Estimated: >60 mL/min (ref >60)
Glucose, Bld: 92 mg/dL (ref 70–99)
Potassium: 4.4 mmol/L (ref 3.5–5.1)
Sodium: 136 mmol/L (ref 135–145)

## 2021-11-29 MED ORDER — CEPHALEXIN 500 MG PO CAPS
500.0000 mg | ORAL_CAPSULE | Freq: Once | ORAL | Status: AC
  Administered 2021-11-29: 500 mg via ORAL
  Filled 2021-11-29: qty 1

## 2021-11-29 MED ORDER — CEPHALEXIN 500 MG PO CAPS: 500.0000 mg | ORAL_CAPSULE | Freq: Four times a day (QID) | ORAL | 0 refills | Status: AC

## 2021-11-29 NOTE — ED Provider Notes (Signed)
[facility] Provider Note  Patient Contact: 7:55 PM (approximate)   History   Medication Reaction   HPI  Jean Morris is a 46 y.o. female with a history of anemia, hypertension and PE, presents to the emergency department with a sensation of feeling altered while taking her Flexeril which has been prescribed for her low back pain.  Patient states that she has had overall improvement in her radicular pain with the meloxicam but states that Flexeril just makes her too sleepy.  She denies bowel or bladder incontinence or saddle anesthesia.  States that she has had some mild abdominal discomfort and is concerned that she has a urinary tract infection.      Physical Exam   Triage Vital Signs: ED Triage Vitals  Enc Vitals Group     BP 11/29/21 1736 (!) 143/85     Pulse Rate 11/29/21 1736 79     Resp 11/29/21 1736 16     Temp 11/29/21 1736 98.8 F (37.1 C)     Temp Source 11/29/21 1736 Oral     SpO2 11/29/21 1736 100 %     Weight 11/29/21 1735 288 lb (130.6 kg)     Height --      Head Circumference --      Peak Flow --      Pain Score 11/29/21 1734 8     Pain Loc --      Pain Edu? --      Excl. in [place]? --     Most recent vital signs: Vitals:   11/29/21 1736  BP: (!) 143/85  Pulse: 79  Resp: 16  Temp: 98.8 F (37.1 C)  SpO2: 100%     General: Alert and in no acute distress. Eyes:  PERRL. EOMI. Head: No acute traumatic findings ENT:      Nose: No congestion/rhinnorhea.      Mouth/Throat: Mucous membranes are moist. Neck: No stridor. No cervical spine tenderness to palpation. Cardiovascular:  Good peripheral perfusion Respiratory: Normal respiratory effort without tachypnea or retractions. Lungs CTAB. Good air entry to the bases with no decreased or absent breath sounds. Gastrointestinal: Bowel sounds 4 quadrants. Soft and nontender to palpation. No guarding or rigidity. No palpable masses. No distention. No CVA  tenderness. Musculoskeletal: Full range of motion to all extremities.  Neurologic:  No gross focal neurologic deficits are appreciated.  Skin:   No rash noted Other:   ED Results / Procedures / Treatments   Labs (all labs ordered are listed, but only abnormal results are displayed) Labs Reviewed  BASIC METABOLIC PANEL - Abnormal; Notable for the following components:      Result Value   Anion gap 4 (*)    All other components within normal limits  CBC WITH DIFFERENTIAL/PLATELET - Abnormal; Notable for the following components:   Hemoglobin 9.2 (*)    HCT 29.5 (*)    MCV 59.8 (*)    MCH 18.7 (*)    RDW 28.1 (*)    Platelets 418 (*)    All other components within normal limits  URINALYSIS, ROUTINE W REFLEX MICROSCOPIC - Abnormal; Notable for the following components:   Color, Urine YELLOW (*)    APPearance HAZY (*)    Hgb urine dipstick LARGE (*)    Protein, ur 30 (*)    Nitrite POSITIVE (*)    Leukocytes,Ua MODERATE (*)    RBC / HPF >50 (*)    Bacteria, UA MANY (*)    All  other components within normal limits      PROCEDURES:  Critical Care performed: No  Procedures   MEDICATIONS ORDERED IN ED: Medications  cephALEXin (KEFLEX) capsule 500 mg (500 mg Oral Given 11/29/21 2050)     IMPRESSION / MDM / ASSESSMENT AND PLAN / ED COURSE  I reviewed the triage vital signs and the nursing notes.                              Assessment and plan:  Medication reaction: Low back pain:  46 year old female presents to the emergency department with persistent low back pain and some suprapubic discomfort.  Vital signs are reassuring at triage.  On exam, patient was alert, active and nontoxic appearing  Advised discontinuing Flexeril as medication is making patient feel sedated.  Patient had findings concerning for urinary tract infection on urinalysis.  We will hold off on steroid at this time as I do not want to worsen UTI.  We will have patient continue to use meloxicam  sparingly for low back pain.  Patient was discharged with Keflex and her first dose was given in the emergency department.     FINAL CLINICAL IMPRESSION(S) / ED DIAGNOSES   Final diagnoses:  Acute cystitis with hematuria     Rx / DC Orders   ED Discharge Orders          Ordered    cephALEXin (KEFLEX) 500 MG capsule  4 times daily        11/29/21 2049             Note:  This document was prepared using Dragon voice recognition software and may include unintentional dictation errors.   Vallarie Mare Clinton, PA-C 11/29/21 2052    Naaman Plummer, MD 12/02/21 [phone]

## 2021-11-29 NOTE — ED Provider Triage Note (Signed)
  Emergency Medicine Provider Triage Evaluation Note  Jean Morris , a 46 y.o.female,  was evaluated in triage.  Pt complains of back pain and lightheadedness.  This patient was previously seen by me for sciatica.  She was prescribed meloxicam and cyclobenzaprine.  She states that she has been taking all the medications, has been taking the cyclobenzaprine 3 times a day.  She states that throughout the day, she will feel lightheaded and dizzy and feel like she needs to sit down.  She states that it is unsure if this is a medicine or if because of anemia or some other cause.  She reports mild improvement in her back/leg pain, though it still persist.   Review of Systems  Positive: Lightheadedness or dizziness Negative: Denies fever, chest pain, vomiting  Physical Exam  There were no vitals filed for this visit. Gen:   Awake, no distress   Resp:  Normal effort  MSK:   Moves extremities without difficulty  Other:    Medical Decision Making  Given the patient's initial medical screening exam, the following diagnostic evaluation has been ordered. The patient will be placed in the appropriate treatment space, once one is available, to complete the evaluation and treatment. I have discussed the plan of care with the patient and I have advised the patient that an ED physician or mid-level practitioner will reevaluate their condition after the test results have been received, as the results may give them additional insight into the type of treatment they may need.    Diagnostics: Labs  Treatments: none immediately   Teodoro Spray, [place] 11/29/21 1736

## 2021-11-29 NOTE — ED Triage Notes (Signed)
Pt arrives with ongoing leg pain and the muscle relaxer she was prescribed making her feel "loopy." Pt is a&ox4 in triage. Pt has been taking muscle relaxer's x3 a day.

## 2021-11-29 NOTE — Discharge Instructions (Addendum)
Take Keflex four times daily for the next seven days.  Discontinue Flexeril. You can continue Meloxicam for low back pain.

## 2021-11-29 NOTE — ED Triage Notes (Signed)
Ems report: pt brought over from [facility] for back pain. Pt waas seen here 9/30 for same. [place] brought them here due to the pt acting like she had AMS. VSS

## 2021-12-05 NOTE — Telephone Encounter (Signed)
Reached out to patient, she is still bleeding and being treated for a UTI. Advised her to schedule an appointment if she would like to come in via [place].

## 2021-12-20 ENCOUNTER — Ambulatory Visit (INDEPENDENT_AMBULATORY_CARE_PROVIDER_SITE_OTHER): Payer: BC Managed Care – PPO | Admitting: Obstetrics and Gynecology

## 2021-12-20 ENCOUNTER — Encounter: Payer: Self-pay | Admitting: Obstetrics and Gynecology

## 2021-12-20 VITALS — BP 134/90 | HR 85 | Ht 65.0 in | Wt 283.0 lb

## 2021-12-20 DIAGNOSIS — Z975 Presence of (intrauterine) contraceptive device: Secondary | ICD-10-CM

## 2021-12-20 DIAGNOSIS — N920 Excessive and frequent menstruation with regular cycle: Secondary | ICD-10-CM

## 2021-12-20 DIAGNOSIS — N921 Excessive and frequent menstruation with irregular cycle: Secondary | ICD-10-CM

## 2021-12-20 MED ORDER — NORETHINDRONE ACETATE 5 MG PO TABS: 5.0000 mg | ORAL_TABLET | Freq: Three times a day (TID) | ORAL | 1 refills | Status: DC

## 2021-12-20 NOTE — Progress Notes (Signed)
HPI:      [name] is a 46 y.o. [phone] who LMP was No LMP recorded (lmp unknown).  Subjective:   She presents today stating that she has now bled on and off heavily for 1 month.  She continues to take the iron.  Of significant note, patient has a Mirena IUD in place since 2018.    Hx: The following portions of the patient's history were reviewed and updated as appropriate:             She  has a past medical history of Anemia, Hypertension, Menorrhagia, and Pulmonary embolism ([place]). She does not have any pertinent problems on file. She  has a past surgical history that includes Cesarean section (1994); Esophagogastroduodenoscopy (egd) with propofol (N/A, 02/09/2020); and Colonoscopy with propofol (N/A, 08/06/2021). Her family history includes Heart attack in her mother; Hypertension in her father, mother, and another family member; Stroke in her mother. She  reports that she has never smoked. She has never used smokeless tobacco. She reports that she does not drink alcohol and does not use drugs. She has a current medication list which includes the following prescription(s): boric acid, levonorgestrel, meloxicam, norethindrone, and ferrous sulfate. She is allergic to pollen extract.       Review of Systems:  Review of Systems  Constitutional: Denied constitutional symptoms, night sweats, recent illness, fatigue, fever, insomnia and weight loss.  Eyes: Denied eye symptoms, eye pain, photophobia, vision change and visual disturbance.  Ears/Nose/Throat/Neck: Denied ear, nose, throat or neck symptoms, hearing loss, nasal discharge, sinus congestion and sore throat.  Cardiovascular: Denied cardiovascular symptoms, arrhythmia, chest pain/pressure, edema, exercise intolerance, orthopnea and palpitations.  Respiratory: Denied pulmonary symptoms, asthma, pleuritic pain, productive sputum, cough, dyspnea and wheezing.  Gastrointestinal: Denied, gastro-esophageal reflux, melena, nausea and  vomiting.  Genitourinary: See HPI for additional information.  Musculoskeletal: Denied musculoskeletal symptoms, stiffness, swelling, muscle weakness and myalgia.  Dermatologic: Denied dermatology symptoms, rash and scar.  Neurologic: Denied neurology symptoms, dizziness, headache, neck pain and syncope.  Psychiatric: Denied psychiatric symptoms, anxiety and depression.  Endocrine: Denied endocrine symptoms including hot flashes and night sweats.   Meds:   Current Outpatient Medications on File Prior to Visit  Medication Sig Dispense Refill   Boric Acid [place] 600 mg vaginally 2 (two) times a week. 500 g 5   levonorgestrel (MIRENA) 20 MCG/24HR IUD 1 Intra Uterine Device (1 each total) by Intrauterine route once. 1 each 0   meloxicam (MOBIC) 15 MG tablet Take 1 tablet (15 mg total) by mouth daily. 30 tablet 11   ferrous sulfate 325 (65 FE) MG tablet Take 1 tablet (325 mg total) by mouth 2 (two) times daily with a meal. (Patient taking differently: Take 325 mg by mouth 3 (three) times daily with meals.) 90 tablet 7   No current facility-administered medications on file prior to visit.      Objective:     Vitals:   12/20/21 1051  BP: (!) 134/90  Pulse: 85   Filed Weights   12/20/21 1051  Weight: 283 lb (128.4 kg)                        Assessment:    [id number] Patient Active Problem List   Diagnosis Date Noted   Morbid obesity with BMI of 60.0-69.9, adult ([place]) 04/18/2015   Abnormal uterine bleeding 04/18/2015   Anemia 04/18/2015   Pulmonary embolism ([place]) 03/11/2015     1. Menorrhagia  with regular cycle   2. Breakthrough bleeding with IUD     Patient continues to experience breakthrough bleeding with IUD.   Plan:            1.  Attempt to stop bleeding with high-dose progesterone-Aygestin.  2.  Ultrasound to rule out uterine fibroids or other notable pathology and to confirm location of IUD.  3.  Future consideration of IUD removal, endometrial sampling and  IUD replacement as well as supplemental progesterone.  4.  Continue iron Orders Orders Placed This Encounter  Procedures   US PELVIS (TRANSABDOMINAL ONLY)   US PELVIS TRANSVAGINAL NON-OB (TV ONLY)     Meds ordered this encounter  Medications   norethindrone (AYGESTIN) 5 MG tablet    Sig: Take 1 tablet (5 mg total) by mouth in the morning, at noon, and at bedtime for 60 doses. Then as directed when bleeding stops.    Dispense:  84 tablet    Refill:  1      F/U  Return in about 6 weeks (around 01/31/2022). I spent 22 minutes involved in the care of this patient preparing to see the patient by obtaining and reviewing her medical history (including labs, imaging tests and prior procedures), documenting clinical information in the electronic health record (EHR), counseling and coordinating care plans, writing and sending prescriptions, ordering tests or procedures and in direct communicating with the patient and medical staff discussing pertinent items from her history and physical exam.  Finis Bud, M.D. 12/20/2021 11:15 AM

## 2021-12-20 NOTE — Progress Notes (Signed)
Patient presents today to discuss abnormal vaginal bleeding. She states she has been bleeding heavily for the last month along with pelvic pain. Patient has Mirena IUD. No additional concerns.

## 2021-12-25 ENCOUNTER — Emergency Department: Payer: BC Managed Care – PPO

## 2021-12-25 ENCOUNTER — Other Ambulatory Visit: Payer: Self-pay

## 2021-12-25 ENCOUNTER — Emergency Department
Admission: EM | Admit: 2021-12-25 | Discharge: 2021-12-25 | Disposition: A | Discharge status: Home / Self Care | Payer: BC Managed Care – PPO | Attending: Emergency Medicine | Admitting: Emergency Medicine

## 2021-12-25 ENCOUNTER — Other Ambulatory Visit: Payer: BC Managed Care – PPO

## 2021-12-25 DIAGNOSIS — K760 Fatty (change of) liver, not elsewhere classified: Secondary | ICD-10-CM | POA: Diagnosis not present

## 2021-12-25 DIAGNOSIS — R1900 Intra-abdominal and pelvic swelling, mass and lump, unspecified site: Secondary | ICD-10-CM | POA: Diagnosis not present

## 2021-12-25 DIAGNOSIS — D508 Other iron deficiency anemias: Secondary | ICD-10-CM

## 2021-12-25 DIAGNOSIS — M545 Low back pain, unspecified: Secondary | ICD-10-CM | POA: Diagnosis not present

## 2021-12-25 DIAGNOSIS — I10 Essential (primary) hypertension: Secondary | ICD-10-CM | POA: Diagnosis not present

## 2021-12-25 DIAGNOSIS — R1032 Left lower quadrant pain: Secondary | ICD-10-CM | POA: Diagnosis not present

## 2021-12-25 DIAGNOSIS — N939 Abnormal uterine and vaginal bleeding, unspecified: Secondary | ICD-10-CM | POA: Insufficient documentation

## 2021-12-25 DIAGNOSIS — R918 Other nonspecific abnormal finding of lung field: Secondary | ICD-10-CM | POA: Diagnosis not present

## 2021-12-25 DIAGNOSIS — R102 Pelvic and perineal pain: Secondary | ICD-10-CM | POA: Diagnosis not present

## 2021-12-25 DIAGNOSIS — N9489 Other specified conditions associated with female genital organs and menstrual cycle: Secondary | ICD-10-CM

## 2021-12-25 LAB — CBC
HCT: 30.9 % — ABNORMAL LOW (ref 36.0–46.0)
Hemoglobin: 10 g/dL — ABNORMAL LOW (ref 12.0–15.0)
MCH: 20.3 pg — ABNORMAL LOW (ref 26.0–34.0)
MCHC: 32.4 g/dL (ref 30.0–36.0)
MCV: 62.7 fL — ABNORMAL LOW (ref 80.0–100.0)
Platelets: 467 10*3/uL — ABNORMAL HIGH (ref 150–400)
RBC: 4.93 MIL/uL (ref 3.87–5.11)
RDW: 26.5 % — ABNORMAL HIGH (ref 11.5–15.5)
WBC: 5.9 10*3/uL (ref 4.0–10.5)
nRBC: 0 % (ref 0.0–0.2)

## 2021-12-25 LAB — COMPREHENSIVE METABOLIC PANEL
ALT: 13 U/L (ref 0–44)
AST: 28 U/L (ref 15–41)
Albumin: 3.7 g/dL (ref 3.5–5.0)
Alkaline Phosphatase: 47 U/L (ref 38–126)
Anion gap: 6 (ref 5–15)
BUN: 10 mg/dL (ref 6–20)
CO2: 24 mmol/L (ref 22–32)
Calcium: 9.4 mg/dL (ref 8.9–10.3)
Chloride: 106 mmol/L (ref 98–111)
Creatinine, Ser: 0.68 mg/dL (ref 0.44–1.00)
GFR, Estimated: >60 mL/min (ref >60)
Glucose, Bld: 106 mg/dL — ABNORMAL HIGH (ref 70–99)
Potassium: 4.1 mmol/L (ref 3.5–5.1)
Sodium: 136 mmol/L (ref 135–145)
Total Bilirubin: 0.5 mg/dL (ref 0.3–1.2)
Total Protein: 8.3 g/dL — ABNORMAL HIGH (ref 6.5–8.1)

## 2021-12-25 LAB — URINALYSIS, ROUTINE W REFLEX MICROSCOPIC
Bacteria, UA: NONE SEEN
Bilirubin Urine: NEGATIVE
Glucose, UA: NEGATIVE mg/dL
Ketones, ur: 5 mg/dL — AB
Nitrite: NEGATIVE
Protein, ur: 100 mg/dL — AB
Specific Gravity, Urine: 1.036 — ABNORMAL HIGH (ref 1.005–1.030)
pH: 5 (ref 5.0–8.0)

## 2021-12-25 LAB — LIPASE, BLOOD: Lipase: 35 U/L (ref 11–51)

## 2021-12-25 LAB — POC URINE PREG, ED: Preg Test, Ur: NEGATIVE

## 2021-12-25 MED ORDER — HYDROCODONE-ACETAMINOPHEN 5-325 MG PO TABS: 1.0000 | ORAL_TABLET | ORAL | 0 refills | Status: DC | PRN

## 2021-12-25 MED ORDER — KETOROLAC TROMETHAMINE 15 MG/ML IJ SOLN
15.0000 mg | Freq: Once | INTRAMUSCULAR | Status: AC
  Administered 2021-12-25: 15 mg via INTRAVENOUS
  Filled 2021-12-25: qty 1

## 2021-12-25 MED ORDER — IOHEXOL 350 MG/ML SOLN
100.0000 mL | Freq: Once | INTRAVENOUS | Status: AC | PRN
  Administered 2021-12-25: 100 mL via INTRAVENOUS

## 2021-12-25 NOTE — ED Provider Triage Note (Signed)
Emergency Medicine Provider Triage Evaluation Note  Averil L Soliman , a 46 y.o. female  was evaluated in triage.  Pt complains of left lower back pain radiates to the abdomen.  Some left lower quadrant pain.  Last time she was evaluated for this they told her sciatica.  Denies fever or chills.  No vomiting or diarrhea.  Review of Systems  Positive:  Negative:   Physical Exam  BP (!) 164/99 (BP Location: Right Arm)   Pulse 95   Temp 98.7 F (37.1 C) (Oral)   Resp 18   LMP  (LMP Unknown)   SpO2 100%  Gen:   Awake, no distress   Resp:  Normal effort  MSK:   Moves extremities without difficulty  Other:  Abdomen is nontender  Medical Decision Making  Medically screening exam initiated at 4:24 PM.  Appropriate orders placed.  Mekenna L Meriweather was informed that the remainder of the evaluation will be completed by another provider, this initial triage assessment does not replace that evaluation, and the importance of remaining in the ED until their evaluation is complete.  We will get UA   Versie Starks, PA-C 12/25/21 1625

## 2021-12-25 NOTE — ED Triage Notes (Signed)
Pt presents to ED with c/o of lower ABD pain and lower back pain, pt states HX of sciatica. Pt ambulatory to triage. Pt denies fevers or chills.

## 2021-12-25 NOTE — Discharge Instructions (Signed)
Please call [name] tomorrow morning to confirm a follow-up appointment hopefully as soon as possible for further work-up and biopsy.  Return to the emergency department for any significant pain or any other symptom personally concerning to yourself.  You have been prescribed pain medication, please use this medicine as needed but only as prescribed.  Do not drink alcohol or drive while taking this medication.

## 2021-12-25 NOTE — ED Provider Notes (Signed)
[facility] Provider Note    Event Date/Time   First MD Initiated Contact with Patient 12/25/21 1735     (approximate)   History   Chief Complaint Abdominal Pain   HPI Jean Morris is a 46 y.o. female, history of hypertension, anemia, pulmonary embolism, obesity, presents to the emergency department for evaluation of abdominal/back pain.  Patient states that this been ongoing for the past month.  She states that the pain is primarily in her left lower abdomen and her left lower back.  She states that she does have some white vaginal bleeding that has been ongoing for the past few months as well, for which she has a ultrasound scheduled next week.  She is unsure if this is related. Denies fever/chills, chest pain, shortness of breath, nausea/vomiting, diarrhea, dysuria, headache, rash/lesions, numb/tingling upper lower extremities, or dizziness/lightheadedness.  History Limitations: No limitations.        Physical Exam  Triage Vital Signs: ED Triage Vitals  Enc Vitals Group     BP 12/25/21 1620 (!) 164/99     Pulse Rate 12/25/21 1619 95     Resp 12/25/21 1619 18     Temp 12/25/21 1619 98.7 F (37.1 C)     Temp Source 12/25/21 1619 Oral     SpO2 12/25/21 1619 100 %     Weight --      Height --      Head Circumference --      Peak Flow --      Pain Score 12/25/21 1619 8     Pain Loc --      Pain Edu? --      Excl. in [place]? --     Most recent vital signs: Vitals:   12/25/21 1619 12/25/21 1620  BP:  (!) 164/99  Pulse: 95   Resp: 18   Temp: 98.7 F (37.1 C)   SpO2: 100%     General: Awake, NAD.  Skin: Warm, dry. No rashes or lesions.  Eyes: PERRL. Conjunctivae normal.  CV: Good peripheral perfusion.  Resp: Normal effort.  Abd: Soft, no distention. Neuro: At baseline. No gross neurological deficits.  Musculoskeletal: Normal ROM of all extremities.  Focused Exam: Mild tenderness in the left lower quadrant.  Negative CVAT  bilaterally.  Physical Exam    ED Results / Procedures / Treatments  Labs (all labs ordered are listed, but only abnormal results are displayed) Labs Reviewed  COMPREHENSIVE METABOLIC PANEL - Abnormal; Notable for the following components:      Result Value   Glucose, Bld 106 (*)    Total Protein 8.3 (*)    All other components within normal limits  CBC - Abnormal; Notable for the following components:   Hemoglobin 10.0 (*)    HCT 30.9 (*)    MCV 62.7 (*)    MCH 20.3 (*)    RDW 26.5 (*)    Platelets 467 (*)    All other components within normal limits  URINALYSIS, ROUTINE W REFLEX MICROSCOPIC - Abnormal; Notable for the following components:   Color, Urine AMBER (*)    APPearance HAZY (*)    Specific Gravity, Urine 1.036 (*)    Hgb urine dipstick LARGE (*)    Ketones, ur 5 (*)    Protein, ur 100 (*)    Leukocytes,Ua TRACE (*)    All other components within normal limits  LIPASE, BLOOD  POC URINE PREG, ED     EKG N/A.  RADIOLOGY  ED Provider Interpretation: I personally reviewed and interpreted this CT scan, findings suspicious for endometrial cancer.  CT Abdomen Pelvis W Contrast  Result Date: 12/25/2021 CLINICAL DATA:  Left lower quadrant abdominal pain. EXAM: CT ABDOMEN AND PELVIS WITH CONTRAST TECHNIQUE: Multidetector CT imaging of the abdomen and pelvis was performed using the standard protocol following bolus administration of intravenous contrast. RADIATION DOSE REDUCTION: This exam was performed according to the departmental dose-optimization program which includes automated exposure control, adjustment of the mA and/or kV according to patient size and/or use of iterative reconstruction technique. CONTRAST:  134m OMNIPAQUE IOHEXOL 350 MG/ML SOLN COMPARISON:  CT abdomen pelvis dated 11/19/2016. FINDINGS: Lower chest: Partially visualized right posterior lung base subpleural nodule measuring 8 mm (11/4) and new since the prior CT and suspicious for metastatic  disease. Further evaluation with chest CT recommended. The visualized lung bases are otherwise clear. No intra-abdominal free air or free fluid. Hepatobiliary: Fatty liver. No biliary dilatation. The gallbladder is unremarkable Pancreas: Unremarkable. No pancreatic ductal dilatation or surrounding inflammatory changes. Spleen: Normal in size without focal abnormality. Adrenals/Urinary Tract: The adrenal glands are unremarkable. The kidneys, visualized ureters, and urinary bladder appear unremarkable. Stomach/Bowel: There is moderate stool throughout the colon. There is no bowel obstruction or active inflammation. The appendix is normal. Vascular/Lymphatic: The abdominal aorta and IVC are unremarkable. No portal venous gas. Retroperitoneal adenopathy measure 2.6 cm short axis to the left of the aorta, 1.8 cm at the level of the left renal hilum, and 2.2 cm in the cavoatrial region. Additional retrocrural adenopathy to the right of the descending thoracic aorta measure 1.6 cm. Reproductive: The uterus is anteverted and enlarged. The endometrium is enlarged heterogeneous and irregular extending to the fundal myometrium1. 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 Gynecology consult is advised. There is a 3 cm left ovarian cyst. The right ovary is unremarkable. Other: None Musculoskeletal: Faint lucent lesion involving the left L2 vertebra suspicious for metastasis. IMPRESSION: 1. 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 Gynecology consult is advised. 2. Retroperitoneal and retrocrural adenopathy. 3. Partially visualized 8 mm right posterior lung base subpleural nodule suspicious for metastatic disease. Further evaluation with chest CT recommended. 4. Faint lucent lesion involving the left L2 vertebra suspicious for metastasis. 5. Fatty liver. 6. No bowel obstruction. Normal appendix. Electronically Signed   By: AAnner CreteM.D.   On: 12/25/2021 18:39    PROCEDURES:  Critical Care  performed: N/A.  Procedures    MEDICATIONS ORDERED IN ED: Medications  ketorolac (TORADOL) 15 MG/ML injection 15 mg (15 mg Intravenous Given 12/25/21 1818)  iohexol (OMNIPAQUE) 350 MG/ML injection 100 mL (100 mLs Intravenous Contrast Given 12/25/21 1824)     IMPRESSION / MDM / ASSESSMENT AND PLAN / ED COURSE  I reviewed the triage vital signs and the nursing notes.                              Differential diagnosis includes, but is not limited to, diverticulitis, ureterolithiasis, cystitis, pyelonephritis, ovarian cyst, ovarian torsion, lumbosacral strain, sciatica.  ED Course Patient appears well, vitals within normal limits.  NAD.  Will treat initially with IV ketorolac 15 mg.  CBC shows no leukocytosis.  Anemia present with hemoglobin of 10.0, increased from 9.2 three weeks prior.  CMP shows no AKI, transaminitis, or electrolyte abnormalities.  Urine pregnancy negative.   Assessment/Plan Patient presents with persistent lower left abdominal pain and low back pain has  been intermittent for the past month.  Endorses some mild intermittent vaginal bleeding.  She appears clinically stable.  Physical exam shows some mild tenderness in the lower left quadrant, otherwise unremarkable.  Her CT abdomen/pelvis does show findings most consistent with endometrial malignancy.  She additionally has a lesion on the left L2 vertebrae, suspicious for metastasis.  In addition, she has a 8 mm posterior lung base nodule suspicious for metastatic disease as well.  We will order CT chest to further evaluate.  Spoke to on-call OB/GYN, Dr. Glennon Mac, who recommended follow-up with her regular OB/GYN for biopsy.  Additionally recommended transvaginal ultrasound at this time as well for further evaluation.  Notified the patient and the family of these findings and the current plan.  Patient is understandably distraught and tearful at this time.  Pending CT chest and pelvic ultrasound.    Patient care  transferred over to Dr. Kerman Passey at approximately 1930.   Patient's presentation is most consistent with acute complicated illness / injury requiring diagnostic workup.       FINAL CLINICAL IMPRESSION(S) / ED DIAGNOSES   Final diagnoses:  None     Rx / DC Orders   ED Discharge Orders     None        Note:  This document was prepared using Dragon voice recognition software and may include unintentional dictation errors.   Teodoro Spray, [place] 12/25/21 1929    Harvest Dark, MD 12/25/21 2219

## 2021-12-25 NOTE — ED Provider Notes (Signed)
-----------------------------------------   9:31 PM on 12/25/2021 ----------------------------------------- Patient's ultrasound has resulted showing no other significant findings compared to the CT.  I spoke to the patient regarding the findings as well as follow-up with her OB/GYN [name].  I have messaged [name] to inform them of today's findings to ensure that she does not get lost to follow-up.  Patient is also to call the office tomorrow to confirm follow-up appointment.  We will discharge with pain medication to be used if needed.  Patient agreeable to plan.   Harvest Dark, MD 12/25/21 2137

## 2021-12-26 ENCOUNTER — Encounter: Payer: Self-pay | Admitting: Obstetrics and Gynecology

## 2021-12-26 ENCOUNTER — Telehealth: Payer: Self-pay | Admitting: Emergency Medicine

## 2021-12-26 ENCOUNTER — Telehealth: Payer: Self-pay

## 2021-12-26 LAB — CBC WITH DIFF/PLATELET
Basophils Absolute: 0 10*3/uL (ref 0.0–0.2)
Basos: 1 %
EOS (ABSOLUTE): 0.1 10*3/uL (ref 0.0–0.4)
Eos: 3 %
Hematocrit: 32.1 % — ABNORMAL LOW (ref 34.0–46.6)
Hemoglobin: 10.1 g/dL — ABNORMAL LOW (ref 11.1–15.9)
Immature Grans (Abs): 0 10*3/uL (ref 0.0–0.1)
Immature Granulocytes: 0 %
Lymphocytes Absolute: 1.6 10*3/uL (ref 0.7–3.1)
Lymphs: 34 %
MCH: 21.1 pg — ABNORMAL LOW (ref 26.6–33.0)
MCHC: 31.5 g/dL (ref 31.5–35.7)
MCV: 67 fL — ABNORMAL LOW (ref 79–97)
Monocytes Absolute: 0.3 10*3/uL (ref 0.1–0.9)
Monocytes: 6 %
Neutrophils Absolute: 2.7 10*3/uL (ref 1.4–7.0)
Neutrophils: 56 %
Platelets: 445 10*3/uL (ref 150–450)
RBC: 4.79 x10E6/uL (ref 3.77–5.28)
RDW: 27.9 % — ABNORMAL HIGH (ref 11.7–15.4)
WBC: 4.8 10*3/uL (ref 3.4–10.8)

## 2021-12-26 MED ORDER — HYDROCODONE-ACETAMINOPHEN 5-325 MG PO TABS: 1.0000 | ORAL_TABLET | ORAL | 0 refills | Status: DC | PRN

## 2021-12-26 NOTE — Telephone Encounter (Signed)
Pt calling; hasn't gotten a call today about being seen for ER f/u.  Adv will send message to schedulers.

## 2021-12-26 NOTE — Telephone Encounter (Signed)
Patient is scheduled for 12/31/21 with [facility]

## 2021-12-26 NOTE — Telephone Encounter (Unsigned)
Need to send rx to different pharmacy

## 2021-12-30 ENCOUNTER — Other Ambulatory Visit: Payer: Self-pay

## 2021-12-30 ENCOUNTER — Ambulatory Visit (INDEPENDENT_AMBULATORY_CARE_PROVIDER_SITE_OTHER): Payer: BC Managed Care – PPO | Admitting: Gastroenterology

## 2021-12-30 ENCOUNTER — Encounter: Payer: Self-pay | Admitting: Gastroenterology

## 2021-12-30 VITALS — BP 142/91 | HR 96 | Temp 99.0°F | Ht 64.5 in | Wt 278.0 lb

## 2021-12-30 DIAGNOSIS — F1990 Other psychoactive substance use, unspecified, uncomplicated: Secondary | ICD-10-CM

## 2021-12-30 DIAGNOSIS — K5903 Drug induced constipation: Secondary | ICD-10-CM

## 2021-12-30 DIAGNOSIS — R1032 Left lower quadrant pain: Secondary | ICD-10-CM

## 2021-12-30 MED ORDER — OMEPRAZOLE 40 MG PO CPDR: 40.0000 mg | DELAYED_RELEASE_CAPSULE | Freq: Every day | ORAL | 0 refills | Status: DC

## 2021-12-30 MED ORDER — PEG 3350-KCL-NA BICARB-NACL 420 G PO SOLR: ORAL | 0 refills | Status: DC

## 2021-12-30 NOTE — Progress Notes (Signed)
Jonathon Bellows MD, MRCP(U.K) [address]  [place]  [place], [place] [address]  Main: [phone]  Fax: [phone]   Primary Care Physician: Tawni Millers, MD  Primary Gastroenterologist:  Dr. Jonathon Bellows   Chief Complaint  Patient presents with   Abdominal Pain   Constipation    HPI: Jean Morris is a 46 y.o. female   Summary of history :  Initially referred and seen in May 2023 for blood in the stools.  History of dysphagia in 2021 and a Schatzki's ring was dilated to 18 mm.  Interval history   07/13/2021-12/30/2021  08/06/2021: Colonoscopy: Diverticulosis of the colon and medium size internal hemorrhoids were noted.  She is here today to see me for abdominal pain and constipation.  She says that she can go up to a few weeks without a bowel movement and only has 1 or 2 bowel movements a month.  She takes hydrocodone for back pain in addition to which she takes meloxicam complains of left lower quadrant pain which is worse when she does not have a good bowel movement.  Has tried MiraLAX over-the-counter which has not worked.  She feels that if she has a good bowel movement she will be able to feel much better and will not have any abdominal pain.  Denies any rectal bleeding.  She says they found a mass on her uterus and she is getting that evaluated soon.  Current Outpatient Medications  Medication Sig Dispense Refill   Boric Acid CRYS Place 600 mg vaginally 2 (two) times a week. 500 g 5   ferrous sulfate 325 (65 FE) MG tablet Take 1 tablet (325 mg total) by mouth 2 (two) times daily with a meal. (Patient taking differently: Take 325 mg by mouth 3 (three) times daily with meals.) 90 tablet 7   HYDROcodone-acetaminophen (NORCO/VICODIN) 5-325 MG tablet Take 1 tablet by mouth every 4 (four) hours as needed. 15 tablet 0   levonorgestrel (MIRENA) 20 MCG/24HR IUD 1 Intra Uterine Device (1 each total) by Intrauterine route once. 1 each 0   meloxicam (MOBIC) 15 MG tablet Take 1  tablet by mouth daily.     norethindrone (AYGESTIN) 5 MG tablet Take 1 tablet (5 mg total) by mouth in the morning, at noon, and at bedtime for 60 doses. Then as directed when bleeding stops. 84 tablet 1   No current facility-administered medications for this visit.    Allergies as of 12/30/2021 - Review Complete 12/30/2021  Allergen Reaction Noted   Bee pollen Nausea And Vomiting 05/28/2016   Pollen extract Nausea And Vomiting 05/28/2016    ROS:  General: Negative for anorexia, weight loss, fever, chills, fatigue, weakness. ENT: Negative for hoarseness, difficulty swallowing , nasal congestion. CV: Negative for chest pain, angina, palpitations, dyspnea on exertion, peripheral edema.  Respiratory: Negative for dyspnea at rest, dyspnea on exertion, cough, sputum, wheezing.  GI: See history of present illness. GU:  Negative for dysuria, hematuria, urinary incontinence, urinary frequency, nocturnal urination.  Endo: Negative for unusual weight change.    Physical Examination:   BP (!) 142/91   Pulse 96   Temp 99 F (37.2 C) (Oral)   Ht 5' 4.5" (1.638 m)   Wt 278 lb (126.1 kg)   LMP  (LMP Unknown) Comment: neg preg test  BMI 46.98 kg/m   General: Well-nourished, well-developed in no acute distress.  Eyes: No icterus. Conjunctivae pink. Abdomen: Bowel sounds are normal, nontender, nondistended, no hepatosplenomegaly or masses, no  abdominal bruits or hernia , no rebound or guarding.   Extremities: No lower extremity edema. No clubbing or deformities. Neuro: Alert and oriented x 3.  Grossly intact. Skin: Warm and dry, no jaundice.   Psych: Alert and cooperative, normal mood and affect.   Imaging Studies: US PELVIC COMPLETE W TRANSVAGINAL AND TORSION R/O  Result Date: 12/25/2021 CLINICAL DATA:  Pelvic pain, abnormal CT with abnormal vaginal bleeding for months. EXAM: TRANSABDOMINAL AND TRANSVAGINAL ULTRASOUND OF PELVIS DOPPLER ULTRASOUND OF OVARIES TECHNIQUE: Both transabdominal  and transvaginal ultrasound examinations of the pelvis were performed. Transabdominal technique was performed for global imaging of the pelvis including uterus, ovaries, adnexal regions, and pelvic cul-de-sac. It was necessary to proceed with endovaginal exam following the transabdominal exam to visualize the endometrium. Color and duplex Doppler ultrasound was utilized to evaluate blood flow to the ovaries. COMPARISON:  CT evaluation acquired on the same date and remote CT imaging. FINDINGS: Uterus Measurements: 19.0 x 13.3 x 15.0 cm = volume: 1,974 mL. Distinct endometrium is not visible. Heterogeneous area which may be surrounded by myometrium may be as much as 7 cm in greatest thickness. Endometrium Thickness: Perhaps as thick as 75 mm. Markedly heterogeneous with signs of internal flow. Right ovary RIGHT ovary is not visualized. Left ovary Measurements: 5.8 x 3.1 x 4.2 cm = volume: 39 mL. Simple dominant follicle measuring up to 3.5 cm accounts for much of LEFT ovarian dimension. Pulsed Doppler evaluation shows low resistance arterial and venous flow within the LEFT ovary. Other findings Small free fluid in the pelvis. IMPRESSION: 1. Marked heterogeneity and thickening of the endometrium with signs of internal flow, highly suggestive of if not diagnostic for endometrial neoplasm. Gyn consultation is suggested if not yet obtained in this patient with documented signs of metastatic adenopathy involving the chest and abdomen on prior CT imaging. 2. Grossly normal LEFT ovary though LEFT ovarian vein shows tumor thrombus on CT imaging. 3. Nonvisualization of the RIGHT ovary. 4. Small free fluid in the pelvis. Electronically Signed   By: Zetta Bills M.D.   On: 12/25/2021 21:10   CT CHEST WO CONTRAST  Result Date: 12/25/2021 CLINICAL DATA:  Right lower lobe lung nodule partially visualized on prior abdominal CT. EXAM: CT CHEST WITHOUT CONTRAST TECHNIQUE: Multidetector CT imaging of the chest was performed  following the standard protocol without IV contrast. RADIATION DOSE REDUCTION: This exam was performed according to the departmental dose-optimization program which includes automated exposure control, adjustment of the mA and/or kV according to patient size and/or use of iterative reconstruction technique. COMPARISON:  10/24/2017 FINDINGS: Cardiovascular: Heart is normal size. Aorta is normal caliber. Mediastinum/Nodes: No mediastinal, hilar, or axillary adenopathy. Trachea and esophagus are unremarkable. Thyroid unremarkable. Lungs/Pleura: Posterior right lower lobe pulmonary nodule measures 10 mm. Mild surrounding ground-glass density. No additional pulmonary nodules, confluent opacities or effusions. Upper Abdomen: CT earlier abdominal CT report Musculoskeletal: Chest wall soft tissues are unremarkable. No acute bony abnormality. IMPRESSION: Posterior right lower lobe subpleural nodule measuring 10 mm with surrounding ground-glass opacity. Consider one of the following in 3 months for both low-risk and high-risk individuals: (a) repeat chest CT, (b) follow-up PET-CT, or (c) tissue sampling. This recommendation follows the consensus statement: Guidelines for Management of Incidental Pulmonary Nodules Detected on CT Images: From the Fleischner Society 2017; Radiology 2017; 284:228-243. Electronically Signed   By: Rolm Baptise M.D.   On: 12/25/2021 19:44   CT Abdomen Pelvis W Contrast  Result Date: 12/25/2021 CLINICAL DATA:  Left lower quadrant  abdominal pain. EXAM: CT ABDOMEN AND PELVIS WITH CONTRAST TECHNIQUE: Multidetector CT imaging of the abdomen and pelvis was performed using the standard protocol following bolus administration of intravenous contrast. RADIATION DOSE REDUCTION: This exam was performed according to the departmental dose-optimization program which includes automated exposure control, adjustment of the mA and/or kV according to patient size and/or use of iterative reconstruction technique.  CONTRAST:  119m OMNIPAQUE IOHEXOL 350 MG/ML SOLN COMPARISON:  CT abdomen pelvis dated 11/19/2016. FINDINGS: Lower chest: Partially visualized right posterior lung base subpleural nodule measuring 8 mm (11/4) and new since the prior CT and suspicious for metastatic disease. Further evaluation with chest CT recommended. The visualized lung bases are otherwise clear. No intra-abdominal free air or free fluid. Hepatobiliary: Fatty liver. No biliary dilatation. The gallbladder is unremarkable Pancreas: Unremarkable. No pancreatic ductal dilatation or surrounding inflammatory changes. Spleen: Normal in size without focal abnormality. Adrenals/Urinary Tract: The adrenal glands are unremarkable. The kidneys, visualized ureters, and urinary bladder appear unremarkable. Stomach/Bowel: There is moderate stool throughout the colon. There is no bowel obstruction or active inflammation. The appendix is normal. Vascular/Lymphatic: The abdominal aorta and IVC are unremarkable. No portal venous gas. Retroperitoneal adenopathy measure 2.6 cm short axis to the left of the aorta, 1.8 cm at the level of the left renal hilum, and 2.2 cm in the cavoatrial region. Additional retrocrural adenopathy to the right of the descending thoracic aorta measure 1.6 cm. Reproductive: The uterus is anteverted and enlarged. The endometrium is enlarged heterogeneous and irregular extending to the fundal myometrium1. 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 Gynecology consult is advised. There is a 3 cm left ovarian cyst. The right ovary is unremarkable. Other: None Musculoskeletal: Faint lucent lesion involving the left L2 vertebra suspicious for metastasis. IMPRESSION: 1. 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 Gynecology consult is advised. 2. Retroperitoneal and retrocrural adenopathy. 3. Partially visualized 8 mm right posterior lung base subpleural nodule suspicious for metastatic disease. Further evaluation with  chest CT recommended. 4. Faint lucent lesion involving the left L2 vertebra suspicious for metastasis. 5. Fatty liver. 6. No bowel obstruction. Normal appendix. Electronically Signed   By: AAnner CreteM.D.   On: 12/25/2021 18:39    Assessment and Plan:   Jean Morris a 46y.o. y/o female here for follow-up of blood in the stool.  Recent colonoscopy showed medium size internal hemorrhoids.  Present issues are related to constipation likely opioid induced and an element of long-term use of Mobic.  Plan 1.  GoLytely cleanout followed by commencing on Linzess 290 mcg samples will be provided for 2 weeks if it works she will call me for prescription if it does not work she will call me to get a new medication to try.  2.  Stop Mobic and commence omeprazole 40 mg once a day for 6 weeks to empirically treat any ulcers.  H. pylori breath test.   She has been instructed if her abdominal pain and constipation do not resolve to call my office to be seen again for follow-up.  If she does not call I would presume that she is feeling better    Dr KJonathon Bellows MD,MRCP ([facility] Follow up in as needed

## 2021-12-31 ENCOUNTER — Encounter: Payer: Self-pay | Admitting: Obstetrics and Gynecology

## 2021-12-31 ENCOUNTER — Other Ambulatory Visit: Payer: Self-pay

## 2021-12-31 ENCOUNTER — Ambulatory Visit (INDEPENDENT_AMBULATORY_CARE_PROVIDER_SITE_OTHER): Payer: BC Managed Care – PPO | Admitting: Obstetrics and Gynecology

## 2021-12-31 ENCOUNTER — Other Ambulatory Visit (HOSPITAL_COMMUNITY)
Admission: RE | Admit: 2021-12-31 | Discharge: 2021-12-31 | Disposition: A | Discharge status: Home / Self Care | Payer: BC Managed Care – PPO | Source: Ambulatory Visit | Attending: Obstetrics and Gynecology | Admitting: Obstetrics and Gynecology

## 2021-12-31 VITALS — BP 144/91 | HR 101 | Ht 65.0 in | Wt 278.8 lb

## 2021-12-31 DIAGNOSIS — N920 Excessive and frequent menstruation with regular cycle: Secondary | ICD-10-CM

## 2021-12-31 DIAGNOSIS — R9389 Abnormal findings on diagnostic imaging of other specified body structures: Secondary | ICD-10-CM | POA: Insufficient documentation

## 2021-12-31 DIAGNOSIS — R238 Other skin changes: Secondary | ICD-10-CM | POA: Diagnosis not present

## 2021-12-31 DIAGNOSIS — C541 Malignant neoplasm of endometrium: Secondary | ICD-10-CM | POA: Diagnosis not present

## 2021-12-31 NOTE — Progress Notes (Signed)
HPI:      [name] is a 46 y.o. [phone] who LMP was No LMP recorded (lmp unknown).  Subjective:   She presents today after being seen in the emergency department for bleeding and cramping.  An ultrasound performed showed a very thickened endometrial lining.  She presents today continuing to complain of bleeding.  She was taking Aygestin to try to manage her bleeding.  She had an IUD and we assumed that it was still in place.  I do not see an IUD on CT and is not mentioned in the report.  Patient denies passing the IUD.    Hx: The following portions of the patient's history were reviewed and updated as appropriate:             She  has a past medical history of Anemia, Hypertension, Menorrhagia, and Pulmonary embolism ([place]). She does not have any pertinent problems on file. She  has a past surgical history that includes Cesarean section (1994); Esophagogastroduodenoscopy (egd) with propofol (N/A, 02/09/2020); and Colonoscopy with propofol (N/A, 08/06/2021). Her family history includes Heart attack in her mother; Hypertension in her father, mother, and another family member; Stroke in her mother. She  reports that she has never smoked. She has never used smokeless tobacco. She reports that she does not drink alcohol and does not use drugs. She has a current medication list which includes the following prescription(s): boric acid, hydrocodone-acetaminophen, levonorgestrel, meloxicam, norethindrone, omeprazole, polyethylene glycol-electrolytes, and ferrous sulfate. She is allergic to bee pollen and pollen extract.       Review of Systems:  Review of Systems  Constitutional: Denied constitutional symptoms, night sweats, recent illness, fatigue, fever, insomnia and weight loss.  Eyes: Denied eye symptoms, eye pain, photophobia, vision change and visual disturbance.  Ears/Nose/Throat/Neck: Denied ear, nose, throat or neck symptoms, hearing loss, nasal discharge, sinus congestion and sore  throat.  Cardiovascular: Denied cardiovascular symptoms, arrhythmia, chest pain/pressure, edema, exercise intolerance, orthopnea and palpitations.  Respiratory: Denied pulmonary symptoms, asthma, pleuritic pain, productive sputum, cough, dyspnea and wheezing.  Gastrointestinal: Denied, gastro-esophageal reflux, melena, nausea and vomiting.  Genitourinary: See HPI for additional information.  Musculoskeletal: Denied musculoskeletal symptoms, stiffness, swelling, muscle weakness and myalgia.  Dermatologic: Denied dermatology symptoms, rash and scar.  Neurologic: Denied neurology symptoms, dizziness, headache, neck pain and syncope.  Psychiatric: Denied psychiatric symptoms, anxiety and depression.  Endocrine: Denied endocrine symptoms including hot flashes and night sweats.   Meds:   Current Outpatient Medications on File Prior to Visit  Medication Sig Dispense Refill   Boric Acid CRYS Place 600 mg vaginally 2 (two) times a week. 500 g 5   HYDROcodone-acetaminophen (NORCO/VICODIN) 5-325 MG tablet Take 1 tablet by mouth every 4 (four) hours as needed. 15 tablet 0   levonorgestrel (MIRENA) 20 MCG/24HR IUD 1 Intra Uterine Device (1 each total) by Intrauterine route once. 1 each 0   meloxicam (MOBIC) 15 MG tablet Take 1 tablet by mouth daily.     norethindrone (AYGESTIN) 5 MG tablet Take 1 tablet (5 mg total) by mouth in the morning, at noon, and at bedtime for 60 doses. Then as directed when bleeding stops. 84 tablet 1   omeprazole (PRILOSEC) 40 MG capsule Take 1 capsule (40 mg total) by mouth daily. For 6 weeks and then stop. 42 capsule 0   polyethylene glycol-electrolytes (NULYTELY) 420 g solution Drink one 8 oz glass of mixture every 15 minutes until you finish all of the jug. 4000 mL 0  ferrous sulfate 325 (65 FE) MG tablet Take 1 tablet (325 mg total) by mouth 2 (two) times daily with a meal. (Patient taking differently: Take 325 mg by mouth 3 (three) times daily with meals.) 90 tablet 7    No current facility-administered medications on file prior to visit.      Objective:     Vitals:   12/31/21 1156  BP: (!) 144/91  Pulse: (!) 101   Filed Weights   12/31/21 1156  Weight: 278 lb 12.8 oz (126.5 kg)              Physical examination   Pelvic:   Vulva: Normal appearance.  No lesions.  Vagina: No lesions or abnormalities noted.  Support: Normal pelvic support.  Urethra No masses tenderness or scarring.  Meatus Normal size without lesions or prolapse.  Cervix: Normal appearance.  No lesions.  Anus: Normal exam.  No lesions.  Perineum: Normal exam.  No lesions.        Bimanual   Uterus: Top normal size  Adnexae: No masses.  Non-tender to palpation.  Cul-de-sac: Negative for abnormality.   Endometrial Biopsy After discussion with the patient regarding her abnormal uterine bleeding I recommended that she proceed with an endometrial biopsy for further diagnosis. The risks, benefits, alternatives, and indications for an endometrial biopsy were discussed with the patient in detail. She understood the risks including infection, bleeding, cervical laceration and uterine perforation.  Verbal consent was obtained.   PROCEDURE NOTE:  Vacurette endometrial biopsy was performed using aseptic technique with iodine preparation.  The uterus was sounded to a length of 10 cm.  Adequate sampling was obtained with minimal blood loss.  The patient tolerated the procedure well.  Disposition will be pending pathology           Assessment:    [id number] Patient Active Problem List   Diagnosis Date Noted   Morbid obesity with BMI of 60.0-69.9, adult ([place]) 04/18/2015   Abnormal uterine bleeding 04/18/2015   Anemia 04/18/2015   Pulmonary embolism ([place]) 03/11/2015     1. Thickened endometrium   2. Menorrhagia with regular cycle     Possible lost IUD  Menorrhagia  Thickened endometrium   Plan:            1.  Await endometrial biopsy results.  2.  Future consideration of  flatplate abdomen or discussion with radiologist regarding CT and lost IUD. Orders No orders of the defined types were placed in this encounter.   No orders of the defined types were placed in this encounter.     F/U  Return for We will contact her with any abnormal test results. I spent 22 minutes involved in the care of this patient preparing to see the patient by obtaining and reviewing her medical history (including labs, imaging tests and prior procedures), documenting clinical information in the electronic health record (EHR), counseling and coordinating care plans, writing and sending prescriptions, ordering tests or procedures and in direct communicating with the patient and medical staff discussing pertinent items from her history and physical exam.  Finis Bud, M.D. 12/31/2021 12:27 PM

## 2021-12-31 NOTE — Progress Notes (Signed)
Patient presents today for endometrial biopsy. She states she is continuing to bleed moderately and having stomach pain. No additional concerns.

## 2022-01-01 ENCOUNTER — Encounter: Payer: Self-pay | Admitting: Obstetrics and Gynecology

## 2022-01-02 DIAGNOSIS — M545 Low back pain, unspecified: Secondary | ICD-10-CM | POA: Diagnosis not present

## 2022-01-02 DIAGNOSIS — R1084 Generalized abdominal pain: Secondary | ICD-10-CM | POA: Diagnosis not present

## 2022-01-02 DIAGNOSIS — N852 Hypertrophy of uterus: Secondary | ICD-10-CM | POA: Diagnosis not present

## 2022-01-02 DIAGNOSIS — Z79899 Other long term (current) drug therapy: Secondary | ICD-10-CM | POA: Diagnosis not present

## 2022-01-02 DIAGNOSIS — I10 Essential (primary) hypertension: Secondary | ICD-10-CM | POA: Diagnosis not present

## 2022-01-02 DIAGNOSIS — C772 Secondary and unspecified malignant neoplasm of intra-abdominal lymph nodes: Secondary | ICD-10-CM | POA: Diagnosis not present

## 2022-01-02 DIAGNOSIS — K5900 Constipation, unspecified: Secondary | ICD-10-CM | POA: Diagnosis not present

## 2022-01-02 DIAGNOSIS — R109 Unspecified abdominal pain: Secondary | ICD-10-CM | POA: Diagnosis not present

## 2022-01-02 LAB — H. PYLORI BREATH TEST: H pylori Breath Test: NEGATIVE

## 2022-01-06 ENCOUNTER — Ambulatory Visit (INDEPENDENT_AMBULATORY_CARE_PROVIDER_SITE_OTHER): Payer: BC Managed Care – PPO

## 2022-01-06 DIAGNOSIS — Z975 Presence of (intrauterine) contraceptive device: Secondary | ICD-10-CM | POA: Diagnosis not present

## 2022-01-06 DIAGNOSIS — N921 Excessive and frequent menstruation with irregular cycle: Secondary | ICD-10-CM

## 2022-01-08 ENCOUNTER — Telehealth: Payer: Self-pay

## 2022-01-08 NOTE — Telephone Encounter (Signed)
Pt calling; is concerned; has been bleeding for three months; is beginning to feel dizzy and keeping headaches.  Please call.  [phone]  Pt states she is not saturating a pad [id number] quite yet; she just wanted to make sure this was okay; adv to take e.s tylenol or IBU for headaches; adv DJE has left for the day but her msg will be sent to him.  This is a good number for tomorrow too.

## 2022-01-14 ENCOUNTER — Telehealth: Payer: Self-pay

## 2022-01-14 ENCOUNTER — Telehealth: Payer: BC Managed Care – PPO | Admitting: Obstetrics and Gynecology

## 2022-01-14 ENCOUNTER — Telehealth (INDEPENDENT_AMBULATORY_CARE_PROVIDER_SITE_OTHER): Payer: BC Managed Care – PPO | Admitting: Obstetrics and Gynecology

## 2022-01-14 ENCOUNTER — Encounter: Payer: Self-pay | Admitting: Obstetrics and Gynecology

## 2022-01-14 DIAGNOSIS — C541 Malignant neoplasm of endometrium: Secondary | ICD-10-CM | POA: Diagnosis not present

## 2022-01-14 NOTE — Progress Notes (Signed)
Virtual Visit via Video Note  I connected with Jean Morris on 01/14/22 at  7:30 AM EST by video and verified that I was speaking with the correct person using two identifiers.    [name] is a 46 y.o. [phone] who LMP was No LMP recorded (lmp unknown). I discussed the limitations, risks, security and privacy concerns of performing an evaluation and management service by video and the availability of in person appointments. I also discussed with the patient that there may be a patient responsible charge related to this service. The patient expressed understanding and agreed to proceed.  Location of patient:  home   Patient gave explicit verbal consent for video visit:  YES  Location of provider:  [facility] office  Persons other than physician and patient involved in provider conference:  None   Subjective:   History of Present Illness:    Patient has had irregular bleeding off and on over the last several months.  She was thought to have an IUD in place and her bleeding was attempted to be controlled with progesterone agents.  As she continued bleeding a CT scan and endometrial biopsy were performed.  No evidence of IUD was found.  Endometrial biopsy results below.  Hx: The following portions of the patient's history were reviewed and updated as appropriate:             She  has a past medical history of Anemia, Hypertension, Menorrhagia, and Pulmonary embolism ([place]). She does not have any pertinent problems on file. She  has a past surgical history that includes Cesarean section (1994); Esophagogastroduodenoscopy (egd) with propofol (N/A, 02/09/2020); and Colonoscopy with propofol (N/A, 08/06/2021). Her family history includes Heart attack in her mother; Hypertension in her father, mother, and another family member; Stroke in her mother. She  reports that she has never smoked. She has never used smokeless tobacco. She reports that she does not drink alcohol and does not use drugs. She  has a current medication list which includes the following prescription(s): boric acid, ferrous sulfate, hydrocodone-acetaminophen, levonorgestrel, meloxicam, norethindrone, omeprazole, and polyethylene glycol-electrolytes. She is allergic to bee pollen and pollen extract.       Review of Systems:  Review of Systems  Constitutional: Denied constitutional symptoms, night sweats, recent illness, fatigue, fever, insomnia and weight loss.  Eyes: Denied eye symptoms, eye pain, photophobia, vision change and visual disturbance.  Ears/Nose/Throat/Neck: Denied ear, nose, throat or neck symptoms, hearing loss, nasal discharge, sinus congestion and sore throat.  Cardiovascular: Denied cardiovascular symptoms, arrhythmia, chest pain/pressure, edema, exercise intolerance, orthopnea and palpitations.  Respiratory: Denied pulmonary symptoms, asthma, pleuritic pain, productive sputum, cough, dyspnea and wheezing.  Gastrointestinal: Denied, gastro-esophageal reflux, melena, nausea and vomiting.  Genitourinary: She continues to have intermittent vaginal bleeding.    Musculoskeletal: Denied musculoskeletal symptoms, stiffness, swelling, muscle weakness and myalgia.  Dermatologic: Denied dermatology symptoms, rash and scar.  Neurologic: Denied neurology symptoms, dizziness, headache, neck pain and syncope.  Psychiatric: Denied psychiatric symptoms, anxiety and depression.  Endocrine: Denied endocrine symptoms including hot flashes and night sweats.   Meds:   Current Outpatient Medications on File Prior to Visit  Medication Sig Dispense Refill   Boric Acid CRYS Place 600 mg vaginally 2 (two) times a week. 500 g 5   ferrous sulfate 325 (65 FE) MG tablet Take 1 tablet (325 mg total) by mouth 2 (two) times daily with a meal. (Patient taking differently: Take 325 mg by mouth 3 (three) times daily with meals.)  90 tablet 7   HYDROcodone-acetaminophen (NORCO/VICODIN) 5-325 MG tablet Take 1 tablet by mouth every 4  (four) hours as needed. 15 tablet 0   levonorgestrel (MIRENA) 20 MCG/24HR IUD 1 Intra Uterine Device (1 each total) by Intrauterine route once. 1 each 0   meloxicam (MOBIC) 15 MG tablet Take 1 tablet by mouth daily.     norethindrone (AYGESTIN) 5 MG tablet Take 1 tablet (5 mg total) by mouth in the morning, at noon, and at bedtime for 60 doses. Then as directed when bleeding stops. 84 tablet 1   omeprazole (PRILOSEC) 40 MG capsule Take 1 capsule (40 mg total) by mouth daily. For 6 weeks and then stop. 42 capsule 0   polyethylene glycol-electrolytes (NULYTELY) 420 g solution Drink one 8 oz glass of mixture every 15 minutes until you finish all of the jug. 4000 mL 0   No current facility-administered medications on file prior to visit.    A. ENDOMETRIUM, BIOPSY:  - Endometrioid adenocarcinoma, FIGO grade 2.  See comment.    Assessment:    G3P3003 Patient Active Problem List   Diagnosis Date Noted   Morbid obesity with BMI of 60.0-69.9, adult ([place]) 04/18/2015   Abnormal uterine bleeding 04/18/2015   Anemia 04/18/2015   Pulmonary embolism ([place]) 03/11/2015     1. Endometrial cancer ([place])      Plan:            1.  We have discussed her diagnosis of endometrial cancer.  I explained to her that surgery will likely be necessary.  I have discussed multiple treatment strategies after surgery.  The necessity of seeing a GYN oncologist was reviewed.  I have made an appointment for her for tomorrow and she is planning to keep that appointment.  Orders No orders of the defined types were placed in this encounter.   No orders of the defined types were placed in this encounter.     F/U  Return for As Scheduled Post-op. I spent 22 minutes involved in the care of this patient preparing to see the patient by obtaining and reviewing her medical history (including labs, imaging tests and prior procedures), documenting clinical information in the electronic health record (EHR), counseling and  coordinating care plans, writing and sending prescriptions, ordering tests or procedures and in direct communicating with the patient and medical staff discussing pertinent items from her history and physical exam.  Finis Bud, M.D. 01/14/2022 8:46 AM

## 2022-01-14 NOTE — Telephone Encounter (Signed)
Reached out to [name] prior to her appointment tomorrow with Dr. Theora Gianotti. At this time she does not have any questions. Provided directions to the cancer center.

## 2022-01-15 ENCOUNTER — Inpatient Hospital Stay: Payer: BC Managed Care – PPO | Attending: Obstetrics and Gynecology | Admitting: Obstetrics and Gynecology

## 2022-01-15 ENCOUNTER — Other Ambulatory Visit: Payer: Self-pay | Admitting: *Deleted

## 2022-01-15 ENCOUNTER — Telehealth: Payer: Self-pay | Admitting: *Deleted

## 2022-01-15 ENCOUNTER — Other Ambulatory Visit: Payer: Self-pay | Admitting: Student

## 2022-01-15 ENCOUNTER — Inpatient Hospital Stay: Payer: BC Managed Care – PPO | Admitting: Licensed Clinical Social Worker

## 2022-01-15 ENCOUNTER — Inpatient Hospital Stay: Payer: BC Managed Care – PPO

## 2022-01-15 ENCOUNTER — Inpatient Hospital Stay (HOSPITAL_BASED_OUTPATIENT_CLINIC_OR_DEPARTMENT_OTHER): Payer: BC Managed Care – PPO | Admitting: Oncology

## 2022-01-15 VITALS — BP 125/66 | HR 118 | Temp 96.5°F | Wt 254.0 lb

## 2022-01-15 VITALS — BP 125/66 | HR 118 | Temp 96.5°F | Resp 19 | Wt 254.0 lb

## 2022-01-15 DIAGNOSIS — C541 Malignant neoplasm of endometrium: Secondary | ICD-10-CM

## 2022-01-15 DIAGNOSIS — D500 Iron deficiency anemia secondary to blood loss (chronic): Secondary | ICD-10-CM | POA: Insufficient documentation

## 2022-01-15 DIAGNOSIS — Z7901 Long term (current) use of anticoagulants: Secondary | ICD-10-CM | POA: Insufficient documentation

## 2022-01-15 DIAGNOSIS — D508 Other iron deficiency anemias: Secondary | ICD-10-CM

## 2022-01-15 DIAGNOSIS — Z86711 Personal history of pulmonary embolism: Secondary | ICD-10-CM | POA: Insufficient documentation

## 2022-01-15 DIAGNOSIS — Z79899 Other long term (current) drug therapy: Secondary | ICD-10-CM | POA: Insufficient documentation

## 2022-01-15 DIAGNOSIS — R590 Localized enlarged lymph nodes: Secondary | ICD-10-CM | POA: Insufficient documentation

## 2022-01-15 DIAGNOSIS — G893 Neoplasm related pain (acute) (chronic): Secondary | ICD-10-CM | POA: Diagnosis not present

## 2022-01-15 DIAGNOSIS — Z5111 Encounter for antineoplastic chemotherapy: Secondary | ICD-10-CM | POA: Insufficient documentation

## 2022-01-15 DIAGNOSIS — D62 Acute posthemorrhagic anemia: Secondary | ICD-10-CM | POA: Diagnosis not present

## 2022-01-15 DIAGNOSIS — R1030 Lower abdominal pain, unspecified: Secondary | ICD-10-CM

## 2022-01-15 DIAGNOSIS — Z6841 Body Mass Index (BMI) 40.0 and over, adult: Secondary | ICD-10-CM | POA: Insufficient documentation

## 2022-01-15 DIAGNOSIS — Z9189 Other specified personal risk factors, not elsewhere classified: Secondary | ICD-10-CM

## 2022-01-15 DIAGNOSIS — Z7189 Other specified counseling: Secondary | ICD-10-CM

## 2022-01-15 DIAGNOSIS — R918 Other nonspecific abnormal finding of lung field: Secondary | ICD-10-CM | POA: Insufficient documentation

## 2022-01-15 DIAGNOSIS — I10 Essential (primary) hypertension: Secondary | ICD-10-CM | POA: Insufficient documentation

## 2022-01-15 DIAGNOSIS — R5383 Other fatigue: Secondary | ICD-10-CM | POA: Insufficient documentation

## 2022-01-15 DIAGNOSIS — D509 Iron deficiency anemia, unspecified: Secondary | ICD-10-CM | POA: Insufficient documentation

## 2022-01-15 LAB — COMPREHENSIVE METABOLIC PANEL
ALT: 12 U/L (ref 0–44)
AST: 21 U/L (ref 15–41)
Albumin: 3.5 g/dL (ref 3.5–5.0)
Alkaline Phosphatase: 60 U/L (ref 38–126)
Anion gap: 12 (ref 5–15)
BUN: 14 mg/dL (ref 6–20)
CO2: 22 mmol/L (ref 22–32)
Calcium: 9.9 mg/dL (ref 8.9–10.3)
Chloride: 97 mmol/L — ABNORMAL LOW (ref 98–111)
Creatinine, Ser: 1.13 mg/dL — ABNORMAL HIGH (ref 0.44–1.00)
GFR, Estimated: >60 mL/min (ref >60)
Glucose, Bld: 113 mg/dL — ABNORMAL HIGH (ref 70–99)
Potassium: 4.3 mmol/L (ref 3.5–5.1)
Sodium: 131 mmol/L — ABNORMAL LOW (ref 135–145)
Total Bilirubin: 0.8 mg/dL (ref 0.3–1.2)
Total Protein: 9.4 g/dL — ABNORMAL HIGH (ref 6.5–8.1)

## 2022-01-15 LAB — IRON AND TIBC
Iron: 14 ug/dL — ABNORMAL LOW (ref 28–170)
Saturation Ratios: 6 % — ABNORMAL LOW (ref 10.4–31.8)
TIBC: 252 ug/dL (ref 250–450)
UIBC: 238 ug/dL

## 2022-01-15 LAB — CBC WITH DIFFERENTIAL/PLATELET
Abs Immature Granulocytes: 0.13 10*3/uL — ABNORMAL HIGH (ref 0.00–0.07)
Basophils Absolute: 0.1 10*3/uL (ref 0.0–0.1)
Basophils Relative: 0 %
Eosinophils Absolute: 0 10*3/uL (ref 0.0–0.5)
Eosinophils Relative: 0 %
HCT: 28.9 % — ABNORMAL LOW (ref 36.0–46.0)
Hemoglobin: 9.8 g/dL — ABNORMAL LOW (ref 12.0–15.0)
Immature Granulocytes: 1 %
Lymphocytes Relative: 10 %
Lymphs Abs: 1.5 10*3/uL (ref 0.7–4.0)
MCH: 20.8 pg — ABNORMAL LOW (ref 26.0–34.0)
MCHC: 33.9 g/dL (ref 30.0–36.0)
MCV: 61.4 fL — ABNORMAL LOW (ref 80.0–100.0)
Monocytes Absolute: 0.6 10*3/uL (ref 0.1–1.0)
Monocytes Relative: 4 %
Neutro Abs: 12.2 10*3/uL — ABNORMAL HIGH (ref 1.7–7.7)
Neutrophils Relative %: 85 %
Platelets: 798 10*3/uL — ABNORMAL HIGH (ref 150–400)
RBC: 4.71 MIL/uL (ref 3.87–5.11)
RDW: 23.1 % — ABNORMAL HIGH (ref 11.5–15.5)
WBC: 14.5 10*3/uL — ABNORMAL HIGH (ref 4.0–10.5)
nRBC: 0 % (ref 0.0–0.2)

## 2022-01-15 LAB — VITAMIN B12: Vitamin B-12: 403 pg/mL (ref 180–914)

## 2022-01-15 LAB — FOLATE: Folate: 10.7 ng/mL (ref >5.9)

## 2022-01-15 LAB — FERRITIN: Ferritin: 60 ng/mL (ref 11–307)

## 2022-01-15 MED ORDER — OXYCODONE HCL 5 MG PO TABS: 5.0000 mg | ORAL_TABLET | ORAL | 0 refills | Status: DC | PRN

## 2022-01-15 MED ORDER — ONDANSETRON HCL 8 MG PO TABS: 8.0000 mg | ORAL_TABLET | Freq: Three times a day (TID) | ORAL | 1 refills | Status: DC | PRN

## 2022-01-15 MED ORDER — LIDOCAINE-PRILOCAINE 2.5-2.5 % EX CREA: TOPICAL_CREAM | CUTANEOUS | 3 refills | Status: DC

## 2022-01-15 MED ORDER — DEXAMETHASONE 4 MG PO TABS: ORAL_TABLET | ORAL | 1 refills | Status: DC

## 2022-01-15 MED ORDER — PROCHLORPERAZINE MALEATE 10 MG PO TABS: 10.0000 mg | ORAL_TABLET | Freq: Four times a day (QID) | ORAL | 1 refills | Status: DC | PRN

## 2022-01-15 MED ORDER — APIXABAN 2.5 MG PO TABS: 2.5000 mg | ORAL_TABLET | Freq: Two times a day (BID) | ORAL | 3 refills | Status: DC

## 2022-01-15 NOTE — Progress Notes (Signed)
Gynecologic Oncology Consult Visit   Referring Provider: [name]  Chief Complaint: Endometrial adenocarcinoma  Subjective:  Jean Morris is a 46 y.o. female who is seen in consultation from [name] for irregular bleeding for many years. Medical history significant for PE in [place] 02/2015  Patient presented to her OB/GYN for irregular bleeding for several months.  She was thought to have an IUD in place and her bleeding was attempted to be controlled with progesterone agents.    12/25/21- CT C/A/P-  for left lower quadrant abdominal pain showed enlarged uterus with thickened endometrium, heterogeneous and irregular extending to the fundal myometrium.  Retroperitoneal and retrocrural adenopathy.  Faint lucent lesion in left L2 vertebrae suspicious for metastasis.  Posterior right lower lobe subpleural nodule measuring 10 mm with surrounding groundglass opacity; suspicious for metastasis though was present on 08/2015 CT but measured 5 mm.   Pelvic ultrasound- Uterus-19 x 13.3 x 15 cm = volume 1974 mls.  Distinct endometrium not visible.  Heterogeneous area which may be surrounded by myometrium may be as much is 7 cm in greatest thickness. Endometrium-thickness, perhaps cystic is 75 mm.  Markedly heterogeneous with signs of internal flow. Right ovary-not visualized Left ovary-5.8 x 3.1 x 4.2 cm.  Simple dominant follicle measuring up to 3.5 cm accounts for much of the left ovarian dimension.  Pulse Doppler evaluation shows low resistance arterial and venous flow within the left ovary Other-small free fluid in the pelvis.  Endometrial biopsy- -endometrioid adenocarcinoma, FIGO grade 2  She was started on norethindrone by [name].   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 She was taking oral contraceptives at that time with increased dosing d/t heavy menstrual periods. Additionally, hx of iron deficiency and has taken oral iron.   She continues to bleed. Also,  reports pelvic pain that is poorly controlled by hydrocodone. Has 20 lb weight loss over past 2 months. Decreased appetite. Short of breath. Additionally, has constipation. Headaches intermittently. 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 She is accompanied by her daughter DP.   Problem List: Patient Active Problem List   Diagnosis Date Noted   Morbid obesity with BMI of 60.0-69.9, adult ([place]) 04/18/2015   Abnormal uterine bleeding 04/18/2015   Anemia 04/18/2015   Pulmonary embolism ([place]) 03/11/2015    Past Medical History: Past Medical History:  Diagnosis Date   Anemia    Hypertension    Menorrhagia    Pulmonary embolism ([place])     Past Surgical History: Past Surgical History:  Procedure Laterality Date   CESAREAN SECTION  1994   COLONOSCOPY WITH PROPOFOL N/A 08/06/2021   Procedure: COLONOSCOPY WITH PROPOFOL;  Surgeon: Jonathon Bellows, MD;  [facility] END[facility]COPY;  Service: Gastroenterology;  Laterality: N/A;   ESOPHAGOGASTRODUODENOSCOPY (EGD) WITH PROPOFOL N/A 02/09/2020   Procedure: ESOPHAGOGASTRODUODENOSCOPY (EGD) WITH PROPOFOL;  Surgeon: Jonathon Bellows, MD;  Location[facility] END[facility]COPY;  Service: Gastroenterology;  Laterality: N/A;    Past Gynecologic History:  Menarche: age 17, last 5 days History of OCP/HRT use: yes Last pap:  03/14/21- ASCUS, HPV negative History of STDs: The patient denies history of sexually transmitted disease. Sexually active: yes; not currently  OB History:  OB History  Gravida Para Term Preterm AB Living  _0 SAB IAB Ectopic Multiple Live Births          3    # Outcome  Date GA Lbr Len/2nd Weight Sex Delivery Anes PTL Lv  3 Term     M CS-LTranv   LIV  2 Term     M Vag-Spont   LIV  1 Term     F Vag-Spont   LIV   Family History: Family History  Problem Relation Age of Onset   Hypertension Mother    Stroke Mother    Heart attack Mother    Cancer Father    Hypertension Father     Cancer Sister    Cancer Sister    Cancer Brother    Hypertension Other    Social History: Social History   Socioeconomic History   Marital status: Single    Spouse name: Not on file   Number of children: Not on file   Years of education: Not on file   Highest education level: Not on file  Occupational History   Not on file  Tobacco Use   Smoking status: Never   Smokeless tobacco: Never  Vaping Use   Vaping Use: Never used  Substance and Sexual Activity   Alcohol use: No   Drug use: No   Sexual activity: Yes    Birth control/protection: I.U.D.  Other Topics Concern   Not on file  Social History Narrative   Not on file   Social Determinants of Health   Financial Resource Strain: Not on file  Food Insecurity: Not on file  Transportation Needs: Not on file  Physical Activity: Not on file  Stress: Not on file  Social Connections: Not on file  Intimate Partner Violence: Not on file   Allergies: Allergies  Allergen Reactions   Bee Pollen Nausea And Vomiting    Itchy, swelling, watery eyes, runny nose.   Pollen Extract Nausea And Vomiting    Itchy, swelling, watery eyes, runny nose.   Current Medications: Current Outpatient Medications  Medication Sig Dispense Refill   ferrous sulfate 325 (65 FE) MG tablet Take 1 tablet (325 mg total) by mouth 2 (two) times daily with a meal. (Patient taking differently: Take 325 mg by mouth 3 (three) times daily with meals.) 90 tablet 7   norethindrone (AYGESTIN) 5 MG tablet Take 1 tablet (5 mg total) by mouth in the morning, at noon, and at bedtime for 60 doses. Then as directed when bleeding stops. 84 tablet 1   Boric Acid CRYS Place 600 mg vaginally 2 (two) times a week. (Patient not taking: Reported on 01/15/2022) 500 g 5   HYDROcodone-acetaminophen (NORCO/VICODIN) 5-325 MG tablet Take 1 tablet by mouth every 4 (four) hours as needed. (Patient not taking: Reported on 01/15/2022) 15 tablet 0   levonorgestrel (MIRENA) 20 MCG/24HR  IUD 1 Intra Uterine Device (1 each total) by Intrauterine route once. (Patient not taking: Reported on 01/15/2022) 1 each 0   meloxicam (MOBIC) 15 MG tablet Take 1 tablet by mouth daily. (Patient not taking: Reported on 01/15/2022)     omeprazole (PRILOSEC) 40 MG capsule Take 1 capsule (40 mg total) by mouth daily. For 6 weeks and then stop. (Patient not taking: Reported on 01/15/2022) 42 capsule 0   polyethylene glycol-electrolytes (NULYTELY) 420 g solution Drink one 8 oz glass of mixture every 15 minutes until you finish all of the jug. (Patient not taking: Reported on 01/15/2022) 4000 mL 0   No current facility-administered medications for this visit.    General: negative for fever or night sweats. 20 lb weight loss & low appetite x 2 months Skin: negative for changes  in moles or sores or rash Eyes: negative for changes in vision HEENT: negative for change in hearing, tinnitus, voice changes Pulmonary: negative for orthopnea, productive cough, wheezing. Shortness of breath Cardiac: negative for palpitations, pain Gastrointestinal: negative for nausea, vomiting, constipation, diarrhea, hematemesis, hematochezia Genitourinary/Sexual: negative for dysuria, retention, hematuria, incontinence Ob/Gyn:  per hpi Musculoskeletal: negative for pain, joint pain, back pain Hematology: hx of PE and pulmonary infarct in 2017 Neurologic/Psych: negative for seizures, paralysis, weakness, numbness. Positive for headaches   Objective:  Physical Examination:  BP 125/66   Pulse (!) 118   Temp (!) 96.5 F (35.8 C)   Resp 19   Wt 254 lb (115.2 kg)   LMP  (LMP Unknown)   SpO2 98%   BMI 42.27 kg/m     ECOG Performance Status: 3 - Symptomatic, >50% confined to bed  GENERAL: Obese. Well appearing. No acute distress. Arrives in wheelchair. Accompanied by daughter, Cletis [place] 'DP' HEENT:  Sclerae anicteric.   NODES:  No cervical, supraclavicular, inguinal, or axillary lymphadenopathy palpated.  LUNGS:   Clear to auscultation bilaterally.  No wheezes or rhonchi. HEART:  tachycardia. Regular. No murmur appreciated. ABDOMEN:  soft. Firm abdominal mass palpated at midline up to level of umbilicus. LLQ tenderness.  MSK:  No focal spinal tenderness to palpation. Full range of motion bilaterally in the upper extremities. EXTREMITIES:  No peripheral edema.   SKIN:  Clear with no obvious rashes or skin changes. NEURO:  Nonfocal. Well oriented.  Appropriate affect.  Pelvic: Exam Chaperoned by NP EGBUS: no lesions Cervix: deviated to the left. Not able to fully visualize left side. On palpation smooth. Vagina: thin rust bleeding and discharge present Uterus: enlarged. Firm. Twenty week size extending side wall to side wall >right than left; limited mobility and filling the cul-de-sac Adnexa: no palpable masses but limited by uterine size BME: not able to assess the parametria due to enlarged uterus Rectovaginal: confirmatory  Lab Review Labs on site today: CBC, CMP, Ferritin, Iron Studies  Radiologic Imaging: Imaging was independently reviewed as discuss in HPI, Assessment    Assessment:  Jean Morris is a 46 y.o. female diagnosed with figo grade 2 endometrial cancer diagnosed on endometrial biopsy, stage IV based on suspicious lung lesion, significantly enlarged adenopathy consistent with metastatic disease as seen on imaging. Symptomatic with lower abdominal pain and vaginal bleeding.    Anemia, due to vaginal bleeding from endometrial cancer.   Body mass index is 42.27 kg/m.  History of PE  Medical co-morbidities complicating care: HTN, Body mass index is 42.27 kg/m., prior surgery Plan:   Problem List Items Addressed This Visit   None Visit Diagnoses     Endometrial cancer determined by uterine biopsy ([place])    -  Primary   Relevant Orders   CBC with Differential/Platelet   Iron and TIBC   Ferritin   Comprehensive metabolic panel   Ambulatory referral to Hematology /  Oncology   Ambulatory referral to Social Work   ABLA (acute blood loss anemia)       Lower abdominal pain       Counseling and coordination of care       At high risk for venous thromboembolism (VTE)         Biopsy sent to [facility] for additional testing including MMR, MSI, HER2, p53, ER/PR.   Labs today including iron studies to evaluate for possible iron deficiency in setting of heavy bleeding and anemia and assess adequacy for chemotherapy.  Referral to medical oncology for consideration of neoadjuvant chemotherapy then see patient back for consideration. Discussed her case with Dr Janese Banks who will see patient today for consideration of neoadjuvant chemotherapy with carboplatin-paclitaxel- and possibly pembrolizumab (to be determined based on biomarkers). Repeat imaging after 3 cycles with consideration of surgery after either 3 or 6 cycles depending on response. There may be a role for radiation either in the palliative setting for adjuvant after chemotherapy and surgery.   Khorana score for VTE in cancer patients was calculated as 3- high risk which doesn't include her history of PE. Would recommend consideration of prophylactic anticoagulation. Discussed Dr. Janese Banks. She can stop the norethindrone.   We reviewed psychosocial strain with new cancer diagnosis, planned treatments, and her physical symptoms. She is currently out of work d/t symptoms. She will be referred to social work for counseling and discussion of community resources as well as potential financial assistance.   The patient's diagnosis, an outline of the further diagnostic and laboratory studies which will be required, the recommendation for surgery, and alternatives were discussed with her and her accompanying family members.  All questions were answered to their satisfaction.  A total of 150 minutes were spent with the patient/family today; >50% was spent in education, counseling and coordination of care for stage IV  endometrial cancer.   Beckey Rutter, DNP, AGNP-C [place] at [facility] [phone] (clinic)  I personally had a face to face interaction and evaluated the patient jointly with the NP, Ms. Beckey Rutter.  I have reviewed her history and available records and have performed the key portions of the physical exam including lymph node survey, abdominal exam, pelvic exam with my findings confirming those documented above by the APP.  I have discussed the case with the APP and the patient.  I agree with the above documentation, assessment and plan which was fully formulated by me.  Counseling was completed by me.   I personally saw the patient and performed a substantive portion of this encounter in conjunction with the listed APP as documented above.  Ang[place] Gaetana Michaelis, MD   CC:  Referring Provider: [name]

## 2022-01-15 NOTE — Telephone Encounter (Signed)
Path called reporting that they do not have any tissue available for MSI testing and said if you want it run, they need a purple tube of blood for it 

## 2022-01-15 NOTE — Progress Notes (Signed)
DISCONTINUE ON PATHWAY REGIMEN - Uterine  No Medical Intervention - Off Treatment.  PRIOR TREATMENT: Off Treatment  Uterine - No Medical Intervention - Off Treatment.  Patient Characteristics: Endometrioid, Newly Diagnosed (Clinical Staging), Nonsurgical Candidate, Stage III-IV Histology: Endometrioid Therapeutic Status: Newly Diagnosed (Clinical Staging) AJCC M Category: cM1 AJCC 8 Stage Grouping: IVB AJCC T Category: cT2 AJCC N Category: cN2

## 2022-01-15 NOTE — Progress Notes (Signed)
[place] Work  Initial Assessment   Jean Morris is a 46 y.o. year old female contacted by phone. Clinical Social Work was referred by medical provider for assessment of psychosocial needs.   SDOH (Social Determinants of Health) assessments performed: Yes   SDOH Screenings   Tobacco Use: Low Risk  (01/15/2022)     Distress Screen completed: Yes    01/15/2022    8:08 AM  ONCBCN DISTRESS SCREENING  Screening Type Initial Screening  Distress experienced in past week (1-10) 0      Family/Social Information:  Housing Arrangement: patient lives with her partner.  Family members/support persons in your life? Pt's mother, sister and two daughters also reside nearby and are available to assist as needed. Transportation concerns: no  Employment: Out on work excuse Pt has been working for her company as a Associate Professor for approximately 5 years.  Pt has spoken with HR and has started the process for FMLA as she has not been able to work since the beginning of November.  Income source: Employment Financial concerns:  Pt does not have financial concerns at present, but may if her income is significantly impacted by treatment. Type of concern: None Food access concerns: no Religious or spiritual practice: Yes-[facility] [facility] Currently in place:  none  Coping/ Adjustment to diagnosis: Patient understands treatment plan and what happens next? Yes, pt is anticipating starting chemotherapy within the next two weeks, to be followed by scans and possible surgery. Concerns about diagnosis and/or treatment: Overwhelmed by information Patient reported stressors: Anxiety/ nervousness and Adjusting to my illness Hopes and/or priorities: Pt's priority is to get treatment started with the hope of a positive outcome. Patient enjoys  not addressed Current coping skills/ strengths: Motivation for treatment/growth  and Supportive family/friends     SUMMARY: Current SDOH Barriers:  No  barriers identified at this time.  Clinical Social Work Clinical Goal(s):  No clinical social work goals at this time  Interventions: Discussed common feeling and emotions when being diagnosed with cancer, and the importance of support during treatment Informed patient of the support team roles and support services at [facility] Provided [place] contact information and encouraged patient to call with any questions or concerns CSW provided pt w/ contact information for [place] should she have concerns or questions as treatment moves forward.     Follow Up Plan: Patient will contact CSW with any support or resource needs Patient verbalizes understanding of plan: Yes    Henriette Combs, LCSW

## 2022-01-15 NOTE — Progress Notes (Signed)
DISCONTINUE ON PATHWAY REGIMEN - Uterine  No Medical Intervention - Off Treatment.  REASON: Other Reason PRIOR TREATMENT: Off Treatment  START OFF PATHWAY REGIMEN - Other   [id number]:Carboplatin AUC=5 IV D1 + Paclitaxel 175 mg/m2 IV D1 q21 Days:   A cycle is every 21 days:     Paclitaxel      Carboplatin   **Always confirm dose/schedule in your pharmacy ordering system**  Patient Characteristics: Intent of Therapy: Non-Curative / Palliative Intent, Discussed with Patient

## 2022-01-15 NOTE — Progress Notes (Signed)
Patient for IR Port Insertion on Friday 01/20/2022, I called and spoke with the patient on the phone and gave pre-procedure instructions. Pt was made aware to be here at 8:30a at the new entrance, NPO after MN prior to procedure as well as driver post procedure/recovery/discharge. Pt stated understanding. Called 01/15/2022

## 2022-01-15 NOTE — Progress Notes (Signed)
Uterine - No Medical Intervention - Off Treatment.  Patient Characteristics: Endometrioid, Newly Diagnosed (Clinical Staging), Nonsurgical Candidate, Stage III-IV Histology: Endometrioid Therapeutic Status: Newly Diagnosed (Clinical Staging) AJCC M Category: cM1 AJCC 8 Stage Grouping: IVB AJCC T Category: cT2 AJCC N Category: cN2

## 2022-01-15 NOTE — Progress Notes (Signed)
Hematology/Oncology Consult note [facility] Telephone:([phone] [phone]  Patient Care Team: Tawni Millers, MD as PCP - General (Internal Medicine) [name], RN as Oncology Nurse Navigator   Name of the patient: Jean Morris  [id number]  Mar 23, 1975    Reason for referral-new diagnosis of endometrial cancer   Referring physician-Dr. Theora Gianotti  Date of visit: 01/15/22   History of presenting illness- Patient is a 46 year old female who has seen [name] for irregular menstrual bleeding.  He has had an IUD in place and initially it was attribute it to use of IUD and was tried to be controlled with progesterone agents.  However due to worsening abdominal pain patient underwent a CT abdomen and pelvis with contrast on 12/25/2021 which showed 8 mm  Posterior lung base mass.  Retroperitoneal adenopathy measuring 2.6 cm additional retrocrural adenopathy.  Faint lucent lesion involving L2 vertebral body.  The uterus is anteverted and enlarged.  Endometrium is enlarged heterogeneous and irregular extending to the fundal myometrium.  Findings consistent with endometrial malignancy.  CT chest without contrast showed a 10 mm right lower lobe subpleural nodule.  Patient had endometrial biopsy which was consistent Fiegel grade 2 endometrioid endometrial carcinoma.  Patient was seen by GYN oncology and hopes to take her for surgery if she has good response to neoadjuvant chemotherapy.  Patient lives with her partner and has 2 adult children.  She reports significant flank pain as well as abdominal pain.  Ongoing vaginal bleeding.  She reports significant fatigue.  She has a prior history of pulmonary embolism in 2017 that was attributed to sequels for which she took anticoagulation for 1 year and stopped.  ECOG PS- 1  Pain scale- 4   Review of systems- Review of Systems  Constitutional:  Positive for malaise/fatigue.  Gastrointestinal:  Positive for  abdominal pain.  Genitourinary:        Vaginal bleeding    Allergies  Allergen Reactions   Bee Pollen Nausea And Vomiting    Itchy, swelling, watery eyes, runny nose.   Pollen Extract Nausea And Vomiting    Itchy, swelling, watery eyes, runny nose.    Patient Active Problem List   Diagnosis Date Noted   Endometrial cancer ([place]) 01/15/2022   Morbid obesity with BMI of 60.0-69.9, adult ([place]) 04/18/2015   Abnormal uterine bleeding 04/18/2015   Anemia 04/18/2015   Pulmonary embolism ([place]) 03/11/2015     Past Medical History:  Diagnosis Date   Anemia    Hypertension    Menorrhagia    Pulmonary embolism [facility])      Past Surgical History:  Procedure Laterality Date   CESAREAN SECTION  1994   COLONOSCOPY WITH PROPOFOL N/A 08/06/2021   Procedure: COLONOSCOPY WITH PROPOFOL;  Surgeon: Jonathon Bellows, MD;  Location: [facility] ENDOSCOPY;  Service: Gastroenterology;  Laterality: N/A;   ESOPHAGOGASTRODUODENOSCOPY (EGD) WITH PROPOFOL N/A 02/09/2020   Procedure: ESOPHAGOGASTRODUODENOSCOPY (EGD) WITH PROPOFOL;  Surgeon: Jonathon Bellows, MD;  Location: [facility] ENDOSCOPY;  Service: Gastroenterology;  Laterality: N/A;    Social History   Socioeconomic History   Marital status: Single    Spouse name: Not on file   Number of children: Not on file   Years of education: Not on file   Highest education level: Not on file  Occupational History   Not on file  Tobacco Use   Smoking status: Never   Smokeless tobacco: Never  Vaping Use   Vaping Use: Never used  Substance and Sexual Activity  Alcohol use: No   Drug use: No   Sexual activity: Yes    Birth control/protection: I.U.D.  Other Topics Concern   Not on file  Social History Narrative   Not on file   Social Determinants of Health   Financial Resource Strain: Not on file  Food Insecurity: Not on file  Transportation Needs: Not on file  Physical Activity: Not on file  Stress: Not on file  Social Connections: Not on file  Intimate  Partner Violence: Not on file     Family History  Problem Relation Age of Onset   Hypertension Mother    Stroke Mother    Heart attack Mother    Cancer Father    Hypertension Father    Cancer Sister    Cancer Sister    Cancer Brother    Hypertension Other      Current Outpatient Medications:    ferrous sulfate 325 (65 FE) MG tablet, Take 1 tablet (325 mg total) by mouth 2 (two) times daily with a meal. (Patient taking differently: Take 325 mg by mouth 3 (three) times daily with meals.), Disp: 90 tablet, Rfl: 7   apixaban (ELIQUIS) 2.5 MG TABS tablet, Take 1 tablet (2.5 mg total) by mouth 2 (two) times daily., Disp: 60 tablet, Rfl: 3   Boric Acid CRYS, Place 600 mg vaginally 2 (two) times a week. (Patient not taking: Reported on 01/15/2022), Disp: 500 g, Rfl: 5   dexamethasone (DECADRON) 4 MG tablet, Take 2 tablets by mouth starting the day after chemotherapy for 3 days. Take with food., Disp: 30 tablet, Rfl: 1   levonorgestrel (MIRENA) 20 MCG/24HR IUD, 1 Intra Uterine Device (1 each total) by Intrauterine route once. (Patient not taking: Reported on 01/15/2022), Disp: 1 each, Rfl: 0   lidocaine-prilocaine (EMLA) cream, Apply to affected area once, Disp: 30 g, Rfl: 3   meloxicam (MOBIC) 15 MG tablet, Take 1 tablet by mouth daily. (Patient not taking: Reported on 01/15/2022), Disp: , Rfl:    norethindrone (AYGESTIN) 5 MG tablet, Take 1 tablet (5 mg total) by mouth in the morning, at noon, and at bedtime for 60 doses. Then as directed when bleeding stops. (Patient not taking: Reported on 01/15/2022), Disp: 84 tablet, Rfl: 1   omeprazole (PRILOSEC) 40 MG capsule, Take 1 capsule (40 mg total) by mouth daily. For 6 weeks and then stop. (Patient not taking: Reported on 01/15/2022), Disp: 42 capsule, Rfl: 0   ondansetron (ZOFRAN) 8 MG tablet, Take 1 tablet (8 mg total) by mouth every 8 (eight) hours as needed for nausea or vomiting. Start on the third day after chemotherapy., Disp: 30 tablet, Rfl:  1   oxyCODONE (OXY IR/ROXICODONE) 5 MG immediate release tablet, Take 1 tablet (5 mg total) by mouth every 4 (four) hours as needed for severe pain., Disp: 120 tablet, Rfl: 0   polyethylene glycol-electrolytes (NULYTELY) 420 g solution, Drink one 8 oz glass of mixture every 15 minutes until you finish all of the jug. (Patient not taking: Reported on 01/15/2022), Disp: 4000 mL, Rfl: 0   prochlorperazine (COMPAZINE) 10 MG tablet, Take 1 tablet (10 mg total) by mouth every 6 (six) hours as needed for nausea or vomiting., Disp: 30 tablet, Rfl: 1   Physical exam:  Vitals:   01/15/22 1014  BP: 125/66  Pulse: (!) 118  Temp: (!) 96.5 F (35.8 C)  TempSrc: Tympanic  Weight: 254 lb (115.2 kg)   Physical Exam Constitutional:      Appearance: She is  obese.     Comments: Sitting in a wheelchair.  Appears fatigued  Cardiovascular:     Rate and Rhythm: Normal rate and regular rhythm.     Heart sounds: Normal heart sounds.  Pulmonary:     Effort: Pulmonary effort is normal.     Breath sounds: Normal breath sounds.  Abdominal:     General: Bowel sounds are normal.     Palpations: Abdomen is soft.     Comments: Firm palpable uterine fundus up to the level of umbilicus  Skin:    General: Skin is warm and dry.  Neurological:     Mental Status: She is alert and oriented to person, place, and time.           Latest Ref Rng & Units 01/15/2022   10:35 AM  CMP  Glucose 70 - 99 mg/dL 113   BUN 6 - 20 mg/dL 14   Creatinine 0.44 - 1.00 mg/dL 1.13   Sodium 135 - 145 mmol/L 131   Potassium 3.5 - 5.1 mmol/L 4.3   Chloride 98 - 111 mmol/L 97   CO2 22 - 32 mmol/L 22   Calcium 8.9 - 10.3 mg/dL 9.9   Total Protein 6.5 - 8.1 g/dL 9.4   Total Bilirubin 0.3 - 1.2 mg/dL 0.8   Alkaline Phos 38 - 126 U/L 60   AST 15 - 41 U/L 21   ALT 0 - 44 U/L 12       Latest Ref Rng & Units 01/15/2022   10:35 AM  CBC  WBC 4.0 - 10.5 K/uL 14.5   Hemoglobin 12.0 - 15.0 g/dL 9.8   Hematocrit 36.0 - 46.0 % 28.9    Platelets 150 - 400 K/uL 798       US PELVIS (TRANSABDOMINAL ONLY)  Result Date: 01/12/2022 Patient Name: Xyla L Cake DOB: Aug 19, 1975 MRN: [id number] LMP: unknown ULTRASOUND REPORT Location: [facility] Date of Service: 01/06/2022 Indications: f/u u/s and CT scan, thickened endometrium, AUB Findings: The uterus is enlarged, anteverted and measures 19.4 x 14.1 x 11.4 cm. Echo texture is heterogenous without evidence of focal masses. The Endometrium measures 82.5 mm - irregular margins - hypervascular Right Ovary is not visualized Left Ovary is not visualized Survey of the adnexa demonstrates no adnexal masses. There is no free fluid in the cul de sac. Impression: 1. Enlarged uterus 2. Thick, hypervascular, irregular endometrium 3. Ovaries are not visualized Recommendations: 1.Clinical correlation with the patient's History and Physical Exam. Edwena Bunde, RDMS, RVT I have reviewed this study and agree with documented findings. Rubie Maid, MD [facility] [place]  US PELVIS TRANSVAGINAL NON-OB (TV ONLY)  Result Date: 01/12/2022 Patient Name: Tiny L Zirbel DOB: October 27, 1975 MRN: [id number] LMP: unknown ULTRASOUND REPORT Location: [facility] Date of Service: 01/06/2022 Indications: f/u u/s and CT scan, thickened endometrium, AUB Findings: The uterus is enlarged, anteverted and measures 19.4 x 14.1 x 11.4 cm. Echo texture is heterogenous without evidence of focal masses. The Endometrium measures 82.5 mm - irregular margins - hypervascular Right Ovary is not visualized Left Ovary is not visualized Survey of the adnexa demonstrates no adnexal masses. There is no free fluid in the cul de sac. Impression: 1. Enlarged uterus 2. Thick, hypervascular, irregular endometrium 3. Ovaries are not visualized Recommendations: 1.Clinical correlation with the patient's History and Physical Exam. Edwena Bunde, RDMS, RVT I have reviewed this study and agree with  documented findings. Rubie Maid, MD [facility] [place]  US PELVIC  COMPLETE W TRANSVAGINAL AND TORSION R/O  Result Date: 12/25/2021 CLINICAL DATA:  Pelvic pain, abnormal CT with abnormal vaginal bleeding for months. EXAM: TRANSABDOMINAL AND TRANSVAGINAL ULTRASOUND OF PELVIS DOPPLER ULTRASOUND OF OVARIES TECHNIQUE: Both transabdominal and transvaginal ultrasound examinations of the pelvis were performed. Transabdominal technique was performed for global imaging of the pelvis including uterus, ovaries, adnexal regions, and pelvic cul-de-sac. It was necessary to proceed with endovaginal exam following the transabdominal exam to visualize the endometrium. Color and duplex Doppler ultrasound was utilized to evaluate blood flow to the ovaries. COMPARISON:  CT evaluation acquired on the same date and remote CT imaging. FINDINGS: Uterus Measurements: 19.0 x 13.3 x 15.0 cm = volume: 1,974 mL. Distinct endometrium is not visible. Heterogeneous area which may be surrounded by myometrium may be as much as 7 cm in greatest thickness. Endometrium Thickness: Perhaps as thick as 75 mm. Markedly heterogeneous with signs of internal flow. Right ovary RIGHT ovary is not visualized. Left ovary Measurements: 5.8 x 3.1 x 4.2 cm = volume: 39 mL. Simple dominant follicle measuring up to 3.5 cm accounts for much of LEFT ovarian dimension. Pulsed Doppler evaluation shows low resistance arterial and venous flow within the LEFT ovary. Other findings Small free fluid in the pelvis. IMPRESSION: 1. Marked heterogeneity and thickening of the endometrium with signs of internal flow, highly suggestive of if not diagnostic for endometrial neoplasm. Gyn consultation is suggested if not yet obtained in this patient with documented signs of metastatic adenopathy involving the chest and abdomen on prior CT imaging. 2. Grossly normal LEFT ovary though LEFT ovarian vein shows tumor thrombus on CT imaging. 3. Nonvisualization of the RIGHT  ovary. 4. Small free fluid in the pelvis. Electronically Signed   By: Zetta Bills M.D.   On: 12/25/2021 21:10   CT CHEST WO CONTRAST  Result Date: 12/25/2021 CLINICAL DATA:  Right lower lobe lung nodule partially visualized on prior abdominal CT. EXAM: CT CHEST WITHOUT CONTRAST TECHNIQUE: Multidetector CT imaging of the chest was performed following the standard protocol without IV contrast. RADIATION DOSE REDUCTION: This exam was performed according to the departmental dose-optimization program which includes automated exposure control, adjustment of the mA and/or kV according to patient size and/or use of iterative reconstruction technique. COMPARISON:  10/24/2017 FINDINGS: Cardiovascular: Heart is normal size. Aorta is normal caliber. Mediastinum/Nodes: No mediastinal, hilar, or axillary adenopathy. Trachea and esophagus are unremarkable. Thyroid unremarkable. Lungs/Pleura: Posterior right lower lobe pulmonary nodule measures 10 mm. Mild surrounding ground-glass density. No additional pulmonary nodules, confluent opacities or effusions. Upper Abdomen: CT earlier abdominal CT report Musculoskeletal: Chest wall soft tissues are unremarkable. No acute bony abnormality. IMPRESSION: Posterior right lower lobe subpleural nodule measuring 10 mm with surrounding ground-glass opacity. Consider one of the following in 3 months for both low-risk and high-risk individuals: (a) repeat chest CT, (b) follow-up PET-CT, or (c) tissue sampling. This recommendation follows the consensus statement: Guidelines for Management of Incidental Pulmonary Nodules Detected on CT Images: From the Fleischner Society 2017; Radiology 2017; 284:228-243. Electronically Signed   By: Rolm Baptise M.D.   On: 12/25/2021 19:44   CT Abdomen Pelvis W Contrast  Result Date: 12/25/2021 CLINICAL DATA:  Left lower quadrant abdominal pain. EXAM: CT ABDOMEN AND PELVIS WITH CONTRAST TECHNIQUE: Multidetector CT imaging of the abdomen and pelvis was  performed using the standard protocol following bolus administration of intravenous contrast. RADIATION DOSE REDUCTION: This exam was performed according to the departmental dose-optimization program which includes automated exposure control, adjustment of the  mA and/or kV according to patient size and/or use of iterative reconstruction technique. CONTRAST:  176m OMNIPAQUE IOHEXOL 350 MG/ML SOLN COMPARISON:  CT abdomen pelvis dated 11/19/2016. FINDINGS: Lower chest: Partially visualized right posterior lung base subpleural nodule measuring 8 mm (11/4) and new since the prior CT and suspicious for metastatic disease. Further evaluation with chest CT recommended. The visualized lung bases are otherwise clear. No intra-abdominal free air or free fluid. Hepatobiliary: Fatty liver. No biliary dilatation. The gallbladder is unremarkable Pancreas: Unremarkable. No pancreatic ductal dilatation or surrounding inflammatory changes. Spleen: Normal in size without focal abnormality. Adrenals/Urinary Tract: The adrenal glands are unremarkable. The kidneys, visualized ureters, and urinary bladder appear unremarkable. Stomach/Bowel: There is moderate stool throughout the colon. There is no bowel obstruction or active inflammation. The appendix is normal. Vascular/Lymphatic: The abdominal aorta and IVC are unremarkable. No portal venous gas. Retroperitoneal adenopathy measure 2.6 cm short axis to the left of the aorta, 1.8 cm at the level of the left renal hilum, and 2.2 cm in the cavoatrial region. Additional retrocrural adenopathy to the right of the descending thoracic aorta measure 1.6 cm. Reproductive: The uterus is anteverted and enlarged. The endometrium is enlarged heterogeneous and irregular extending to the fundal myometrium1. 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 Gynecology consult is advised. There is a 3 cm left ovarian cyst. The right ovary is unremarkable. Other: None Musculoskeletal: Faint lucent  lesion involving the left L2 vertebra suspicious for metastasis. IMPRESSION: 1. 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 Gynecology consult is advised. 2. Retroperitoneal and retrocrural adenopathy. 3. Partially visualized 8 mm right posterior lung base subpleural nodule suspicious for metastatic disease. Further evaluation with chest CT recommended. 4. Faint lucent lesion involving the left L2 vertebra suspicious for metastasis. 5. Fatty liver. 6. No bowel obstruction. Normal appendix. Electronically Signed   By: AAnner CreteM.D.   On: 12/25/2021 18:39    Assessment and plan- Patient is a 46y.o. 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  Given her young plan is to proceed with neoadjuvant chemotherapy and consider potential surgery down the line.  3 cycles of neoadjuvant chemotherapy to start with.  Carboplatin AUC 5 along with Taxol at 175 mg/m given IV every 3 weeks.  Discussed risks and benefits of chemotherapy including all but not limited to nausea, vomiting, low blood counts, risk of infections and hospitalization.  Risk of infusion reaction and peripheral neuropathy associated with chemotherapy.  Treatment will be with palliative intent.  HER2 testing as well as MSI testing.  Has been sent out on the tumor specimen.  If she is found to have tolerating her regimen as well.  Plan for port placement and chemo teach and starting cycle 1 of chemotherapy early next week.  Her lab tests indicating of significant microcytic anemia likely secondary toIron deficiency.  I will therefore be giving her 2 doses of Feraheme 510 mg along with chemotherapy.  Discussed risks and benefits of IV iron including all but not limited to possible infusion and anaphylactic reaction.  Patient understands and agrees to proceed as  planned.  Given patient's prior history of PE associated with birth control pills, existing malignancy, obesity-she does have a high Khorana score and would benefit from prophylactic anticoagulation.  Given her ongoing vaginal bleeding I am starting her on Eliquis 2.5 mg twice daily and  see how she tolerates it.  If her bleeding worsens we will have a low threshold to stop it   Cancer Staging  Endometrial cancer [facility]) Staging form: Corpus Uteri - Carcinoma and Carcinosarcoma, AJCC 8th Edition - Clinical stage from 01/15/2022: FIGO Stage IVB (cT2, cN2, cM1) - Signed by Sindy Guadeloupe, MD on 01/15/2022 Stage prefix: Initial diagnosis     Visit Diagnosis 1. Endometrial cancer ([place])   2. Goals of care, counseling/discussion     Dr. Randa Evens, MD, MPH [facility] [phone] 01/15/2022 9:38 AM

## 2022-01-16 ENCOUNTER — Other Ambulatory Visit: Payer: Self-pay

## 2022-01-17 ENCOUNTER — Other Ambulatory Visit: Payer: Self-pay

## 2022-01-17 ENCOUNTER — Ambulatory Visit
Admission: RE | Admit: 2022-01-17 | Discharge: 2022-01-17 | Disposition: A | Discharge status: Home / Self Care | Payer: BC Managed Care – PPO | Source: Ambulatory Visit | Attending: Oncology | Admitting: Oncology

## 2022-01-17 ENCOUNTER — Encounter: Payer: Self-pay | Admitting: Oncology

## 2022-01-17 ENCOUNTER — Encounter: Payer: Self-pay | Admitting: Radiology

## 2022-01-17 ENCOUNTER — Ambulatory Visit: Payer: BC Managed Care – PPO | Admitting: Radiology

## 2022-01-17 DIAGNOSIS — C541 Malignant neoplasm of endometrium: Secondary | ICD-10-CM | POA: Insufficient documentation

## 2022-01-17 DIAGNOSIS — Z452 Encounter for adjustment and management of vascular access device: Secondary | ICD-10-CM | POA: Diagnosis not present

## 2022-01-17 HISTORY — PX: IR IMAGING GUIDED PORT INSERTION: IMG5740

## 2022-01-17 LAB — SURGICAL PATHOLOGY

## 2022-01-17 MED ORDER — LIDOCAINE-EPINEPHRINE 1 %-1:100000 IJ SOLN
INTRAMUSCULAR | Status: AC
  Administered 2022-01-17: 19 mL
  Filled 2022-01-17: qty 1

## 2022-01-17 MED ORDER — MIDAZOLAM HCL 5 MG/5ML IJ SOLN
INTRAMUSCULAR | Status: AC | PRN
  Administered 2022-01-17: 1 mg via INTRAVENOUS

## 2022-01-17 MED ORDER — MIDAZOLAM HCL 2 MG/2ML IJ SOLN
INTRAMUSCULAR | Status: AC | PRN
  Administered 2022-01-17: 1 mg via INTRAVENOUS

## 2022-01-17 MED ORDER — FENTANYL CITRATE (PF) 100 MCG/2ML IJ SOLN
INTRAMUSCULAR | Status: AC | PRN
  Administered 2022-01-17 (×2): 50 ug via INTRAVENOUS

## 2022-01-17 MED ORDER — FENTANYL CITRATE (PF) 100 MCG/2ML IJ SOLN
INTRAMUSCULAR | Status: AC
  Filled 2022-01-17: qty 2

## 2022-01-17 MED ORDER — HEPARIN SOD (PORK) LOCK FLUSH 100 UNIT/ML IV SOLN
INTRAVENOUS | Status: AC
  Administered 2022-01-17: 500 [IU]
  Filled 2022-01-17: qty 5

## 2022-01-17 MED ORDER — MIDAZOLAM HCL 2 MG/2ML IJ SOLN
INTRAMUSCULAR | Status: AC
  Filled 2022-01-17: qty 2

## 2022-01-17 MED ORDER — SODIUM CHLORIDE 0.9 % IV SOLN: INTRAVENOUS | Status: DC

## 2022-01-17 NOTE — Procedures (Signed)
Interventional Radiology Procedure Note  Procedure: RT IJ POWER PORT    Complications: None  Estimated Blood Loss:  0  Findings: TIP SVCRA    M. TREVOR Fernandez Kenley, MD    

## 2022-01-17 NOTE — H&P (Addendum)
Chief Complaint: Patient was seen in consultation today for endometrial adenocarcinoma at the request of Comer C  Referring Physician(s): Rao,Archana C  Supervising Physician: Daryll Brod  Patient Status: [facility] - Out-pt  History of Present Illness: Jean Morris is a 46 y.o. female recently diagnosed with endometrial adenocarcinoma after presenting to OB/GYN for irregular bleeding for several months.  CT AP 12/25/2021 showed enlarged uterus with thickened endometrium, heterogenus and irregular extending to the fundal myometrium with retroperitoneal and retrocrural adenopathy as well as a faint lucent lesion in the left L2 vertebrae suspicious for metastasis.  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  She is followed by oncology and has been referred to IR for tunneled catheter with port placement for chemotherapy.  Pt denies fever, chills, CP, weakness, N/V.  She endorses DOE, fatigue, loss of appetite and weight loss.  She is NPO per order.    Past Medical History:  Diagnosis Date   Anemia    Hypertension    Menorrhagia    Pulmonary embolism [facility])     Past Surgical History:  Procedure Laterality Date   CESAREAN SECTION  1994   COLONOSCOPY WITH PROPOFOL N/A 08/06/2021   Procedure: COLONOSCOPY WITH PROPOFOL;  Surgeon: Jonathon Bellows, MD;  Location: [facility] ENDOSCOPY;  Service: Gastroenterology;  Laterality: N/A;   ESOPHAGOGASTRODUODENOSCOPY (EGD) WITH PROPOFOL N/A 02/09/2020   Procedure: ESOPHAGOGASTRODUODENOSCOPY (EGD) WITH PROPOFOL;  Surgeon: Jonathon Bellows, MD;  Location: [facility] ENDOSCOPY;  Service: Gastroenterology;  Laterality: N/A;    Allergies: Bee pollen and Pollen extract  Medications: Prior to Admission medications   Medication Sig Start Date End Date Taking? Authorizing Provider  apixaban (ELIQUIS) 2.5 MG TABS tablet Take 1 tablet (2.5 mg total) by mouth 2 (two) times daily. 01/15/22   Sindy Guadeloupe, MD  Boric Acid [facility] 600 mg  vaginally 2 (two) times a week. Patient not taking: Reported on 01/15/2022 10/28/21   Rubie Maid, MD  dexamethasone (DECADRON) 4 MG tablet Take 2 tablets by mouth starting the day after chemotherapy for 3 days. Take with food. 01/15/22   Sindy Guadeloupe, MD  ferrous sulfate 325 (65 FE) MG tablet Take 1 tablet (325 mg total) by mouth 2 (two) times daily with a meal. Patient taking differently: Take 325 mg by mouth 3 (three) times daily with meals. 05/28/16 01/15/22  Tawni Millers, MD  levonorgestrel (MIRENA) 20 MCG/24HR IUD 1 Intra Uterine Device (1 each total) by Intrauterine route once. Patient not taking: Reported on 01/15/2022 04/18/15   Rubie Maid, MD  lidocaine-prilocaine (EMLA) cream Apply to affected area once 01/15/22   Sindy Guadeloupe, MD  meloxicam (MOBIC) 15 MG tablet Take 1 tablet by mouth daily. Patient not taking: Reported on 01/15/2022 11/23/21 11/23/22  [provider]  norethindrone (AYGESTIN) 5 MG tablet Take 1 tablet (5 mg total) by mouth in the morning, at noon, and at bedtime for 60 doses. Then as directed when bleeding stops. Patient not taking: Reported on 01/15/2022 12/20/21 01/15/22  Harlin Heys, MD  omeprazole (PRILOSEC) 40 MG capsule Take 1 capsule (40 mg total) by mouth daily. For 6 weeks and then stop. Patient not taking: Reported on 01/15/2022 12/30/21   Jonathon Bellows, MD  ondansetron (ZOFRAN) 8 MG tablet Take 1 tablet (8 mg total) by mouth every 8 (eight) hours as needed for nausea or vomiting. Start on the third day after chemotherapy. 01/15/22   Sindy Guadeloupe, MD  oxyCODONE (OXY IR/ROXICODONE) 5 MG immediate release tablet Take  1 tablet (5 mg total) by mouth every 4 (four) hours as needed for severe pain. 01/15/22   Sindy Guadeloupe, MD  polyethylene glycol-electrolytes (NULYTELY) 420 g solution Drink one 8 oz glass of mixture every 15 minutes until you finish all of the jug. Patient not taking: Reported on 01/15/2022 12/30/21   Jonathon Bellows, MD   prochlorperazine (COMPAZINE) 10 MG tablet Take 1 tablet (10 mg total) by mouth every 6 (six) hours as needed for nausea or vomiting. 01/15/22   Sindy Guadeloupe, MD     Family History  Problem Relation Age of Onset   Hypertension Mother    Stroke Mother    Heart attack Mother    Cancer Father    Hypertension Father    Cancer Sister    Cancer Sister    Cancer Brother    Hypertension Other     Social History   Socioeconomic History   Marital status: Soil scientist    Spouse name: Not on file   Number of children: Not on file   Years of education: Not on file   Highest education level: Not on file  Occupational History   Not on file  Tobacco Use   Smoking status: Never   Smokeless tobacco: Never  Vaping Use   Vaping Use: Never used  Substance and Sexual Activity   Alcohol use: No   Drug use: No   Sexual activity: Yes    Birth control/protection: I.U.D.    Comment: patient states MD said did not see in CT today 01/17/2022  Other Topics Concern   Not on file  Social History Narrative   Lives with [facility], partner, and no pets.   Social Determinants of Health   Financial Resource Strain: Not on file  Food Insecurity: Not on file  Transportation Needs: Not on file  Physical Activity: Not on file  Stress: Not on file  Social Connections: Not on file     Review of Systems: A 12 point ROS discussed and pertinent positives are indicated in the HPI above.  All other systems are negative.  Review of Systems  Constitutional:  Positive for appetite change, fatigue and unexpected weight change. Negative for chills and fever.  Respiratory:  Positive for shortness of breath.   Cardiovascular:  Negative for chest pain and leg swelling.  Gastrointestinal:  Positive for abdominal pain. Negative for nausea and vomiting.  Genitourinary:  Positive for pelvic pain and vaginal bleeding.  Musculoskeletal:  Positive for back pain.  Neurological:  Negative for dizziness, weakness  and headaches.    Vital Signs: BP (!) 138/99   Pulse (!) 112   Temp 98.2 F (36.8 C) (Oral)   Resp 13   Ht '5\' 5"'$  (1.651 m)   Wt 254 lb (115.2 kg)   LMP  (LMP Unknown)   SpO2 98%   BMI 42.27 kg/m     Physical Exam Vitals reviewed.  Constitutional:      General: She is not in acute distress.    Appearance: Normal appearance. She is not ill-appearing.  HENT:     Head: Normocephalic and atraumatic.     Mouth/Throat:     Pharynx: Oropharynx is clear.  Eyes:     Extraocular Movements: Extraocular movements intact.     Pupils: Pupils are equal, round, and reactive to light.  Cardiovascular:     Rate and Rhythm: Regular rhythm. Tachycardia present.     Pulses: Normal pulses.     Heart sounds: Normal heart sounds.  No murmur heard. Pulmonary:     Effort: Pulmonary effort is normal. No respiratory distress.     Breath sounds: Normal breath sounds.  Abdominal:     General: Bowel sounds are normal. There is no distension.     Palpations: Abdomen is soft.     Tenderness: There is abdominal tenderness. There is guarding.  Musculoskeletal:     Right lower leg: No edema.     Left lower leg: No edema.  Skin:    General: Skin is warm and dry.  Neurological:     Mental Status: She is alert and oriented to person, place, and time.  Psychiatric:        Mood and Affect: Mood normal.        Behavior: Behavior normal.        Thought Content: Thought content normal.        Judgment: Judgment normal.     Imaging: US PELVIS (TRANSABDOMINAL ONLY)  Result Date: 01/12/2022 Patient Name: Anayah L Rother DOB: Sep 12, 1975 MRN: [id number] LMP: unknown ULTRASOUND REPORT Location: [facility] [place] Date of Service: 01/06/2022 Indications: f/u u/s and CT scan, thickened endometrium, AUB Findings: The uterus is enlarged, anteverted and measures 19.4 x 14.1 x 11.4 cm. Echo texture is heterogenous without evidence of focal masses. The Endometrium measures 82.5 mm - irregular margins -  hypervascular Right Ovary is not visualized Left Ovary is not visualized Survey of the adnexa demonstrates no adnexal masses. There is no free fluid in the cul de sac. Impression: 1. Enlarged uterus 2. Thick, hypervascular, irregular endometrium 3. Ovaries are not visualized Recommendations: 1.Clinical correlation with the patient's History and Physical Exam. Edwena Bunde, RDMS, RVT I have reviewed this study and agree with documented findings. Rubie Maid, MD [facility] [place]  US PELVIS TRANSVAGINAL NON-OB (TV ONLY)  Result Date: 01/12/2022 Patient Name: Monesha L Vandevelde DOB: 1975-11-21 MRN: [id number] LMP: unknown ULTRASOUND REPORT Location: [place] [facility] at [facility] Date of Service: 01/06/2022 Indications: f/u u/s and CT scan, thickened endometrium, AUB Findings: The uterus is enlarged, anteverted and measures 19.4 x 14.1 x 11.4 cm. Echo texture is heterogenous without evidence of focal masses. The Endometrium measures 82.5 mm - irregular margins - hypervascular Right Ovary is not visualized Left Ovary is not visualized Survey of the adnexa demonstrates no adnexal masses. There is no free fluid in the cul de sac. Impression: 1. Enlarged uterus 2. Thick, hypervascular, irregular endometrium 3. Ovaries are not visualized Recommendations: 1.Clinical correlation with the patient's History and Physical Exam. Edwena Bunde, RDMS, RVT I have reviewed this study and agree with documented findings. Rubie Maid, MD [facility]   US PELVIC COMPLETE W TRANSVAGINAL AND TORSION R/O  Result Date: 12/25/2021 CLINICAL DATA:  Pelvic pain, abnormal CT with abnormal vaginal bleeding for months. EXAM: TRANSABDOMINAL AND TRANSVAGINAL ULTRASOUND OF PELVIS DOPPLER ULTRASOUND OF OVARIES TECHNIQUE: Both transabdominal and transvaginal ultrasound examinations of the pelvis were performed. Transabdominal technique was performed for global imaging of the pelvis including uterus, ovaries,  adnexal regions, and pelvic cul-de-sac. It was necessary to proceed with endovaginal exam following the transabdominal exam to visualize the endometrium. Color and duplex Doppler ultrasound was utilized to evaluate blood flow to the ovaries. COMPARISON:  CT evaluation acquired on the same date and remote CT imaging. FINDINGS: Uterus Measurements: 19.0 x 13.3 x 15.0 cm = volume: 1,974 mL. Distinct endometrium is not visible. Heterogeneous area which may be surrounded by myometrium may be as much as 7 cm in greatest  thickness. Endometrium Thickness: Perhaps as thick as 75 mm. Markedly heterogeneous with signs of internal flow. Right ovary RIGHT ovary is not visualized. Left ovary Measurements: 5.8 x 3.1 x 4.2 cm = volume: 39 mL. Simple dominant follicle measuring up to 3.5 cm accounts for much of LEFT ovarian dimension. Pulsed Doppler evaluation shows low resistance arterial and venous flow within the LEFT ovary. Other findings Small free fluid in the pelvis. IMPRESSION: 1. Marked heterogeneity and thickening of the endometrium with signs of internal flow, highly suggestive of if not diagnostic for endometrial neoplasm. Gyn consultation is suggested if not yet obtained in this patient with documented signs of metastatic adenopathy involving the chest and abdomen on prior CT imaging. 2. Grossly normal LEFT ovary though LEFT ovarian vein shows tumor thrombus on CT imaging. 3. Nonvisualization of the RIGHT ovary. 4. Small free fluid in the pelvis. Electronically Signed   By: Zetta Bills M.D.   On: 12/25/2021 21:10   CT CHEST WO CONTRAST  Result Date: 12/25/2021 CLINICAL DATA:  Right lower lobe lung nodule partially visualized on prior abdominal CT. EXAM: CT CHEST WITHOUT CONTRAST TECHNIQUE: Multidetector CT imaging of the chest was performed following the standard protocol without IV contrast. RADIATION DOSE REDUCTION: This exam was performed according to the departmental dose-optimization program which includes  automated exposure control, adjustment of the mA and/or kV according to patient size and/or use of iterative reconstruction technique. COMPARISON:  10/24/2017 FINDINGS: Cardiovascular: Heart is normal size. Aorta is normal caliber. Mediastinum/Nodes: No mediastinal, hilar, or axillary adenopathy. Trachea and esophagus are unremarkable. Thyroid unremarkable. Lungs/Pleura: Posterior right lower lobe pulmonary nodule measures 10 mm. Mild surrounding ground-glass density. No additional pulmonary nodules, confluent opacities or effusions. Upper Abdomen: CT earlier abdominal CT report Musculoskeletal: Chest wall soft tissues are unremarkable. No acute bony abnormality. IMPRESSION: Posterior right lower lobe subpleural nodule measuring 10 mm with surrounding ground-glass opacity. Consider one of the following in 3 months for both low-risk and high-risk individuals: (a) repeat chest CT, (b) follow-up PET-CT, or (c) tissue sampling. This recommendation follows the consensus statement: Guidelines for Management of Incidental Pulmonary Nodules Detected on CT Images: From the Fleischner Society 2017; Radiology 2017; 284:228-243. Electronically Signed   By: Rolm Baptise M.D.   On: 12/25/2021 19:44   CT Abdomen Pelvis W Contrast  Result Date: 12/25/2021 CLINICAL DATA:  Left lower quadrant abdominal pain. EXAM: CT ABDOMEN AND PELVIS WITH CONTRAST TECHNIQUE: Multidetector CT imaging of the abdomen and pelvis was performed using the standard protocol following bolus administration of intravenous contrast. RADIATION DOSE REDUCTION: This exam was performed according to the departmental dose-optimization program which includes automated exposure control, adjustment of the mA and/or kV according to patient size and/or use of iterative reconstruction technique. CONTRAST:  179m OMNIPAQUE IOHEXOL 350 MG/ML SOLN COMPARISON:  CT abdomen pelvis dated 11/19/2016. FINDINGS: Lower chest: Partially visualized right posterior lung base  subpleural nodule measuring 8 mm (11/4) and new since the prior CT and suspicious for metastatic disease. Further evaluation with chest CT recommended. The visualized lung bases are otherwise clear. No intra-abdominal free air or free fluid. Hepatobiliary: Fatty liver. No biliary dilatation. The gallbladder is unremarkable Pancreas: Unremarkable. No pancreatic ductal dilatation or surrounding inflammatory changes. Spleen: Normal in size without focal abnormality. Adrenals/Urinary Tract: The adrenal glands are unremarkable. The kidneys, visualized ureters, and urinary bladder appear unremarkable. Stomach/Bowel: There is moderate stool throughout the colon. There is no bowel obstruction or active inflammation. The appendix is normal. Vascular/Lymphatic: The abdominal aorta and  IVC are unremarkable. No portal venous gas. Retroperitoneal adenopathy measure 2.6 cm short axis to the left of the aorta, 1.8 cm at the level of the left renal hilum, and 2.2 cm in the cavoatrial region. Additional retrocrural adenopathy to the right of the descending thoracic aorta measure 1.6 cm. Reproductive: The uterus is anteverted and enlarged. The endometrium is enlarged heterogeneous and irregular extending to the fundal myometrium1. 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 Gynecology consult is advised. There is a 3 cm left ovarian cyst. The right ovary is unremarkable. Other: None Musculoskeletal: Faint lucent lesion involving the left L2 vertebra suspicious for metastasis. IMPRESSION: 1. 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 Gynecology consult is advised. 2. Retroperitoneal and retrocrural adenopathy. 3. Partially visualized 8 mm right posterior lung base subpleural nodule suspicious for metastatic disease. Further evaluation with chest CT recommended. 4. Faint lucent lesion involving the left L2 vertebra suspicious for metastasis. 5. Fatty liver. 6. No bowel obstruction. Normal appendix.  Electronically Signed   By: Anner Crete M.D.   On: 12/25/2021 18:39    Labs:  CBC: Recent Labs    11/29/21 1737 12/25/21 0907 12/25/21 1622 01/15/22 1035  WBC 6.8 4.8 5.9 14.5*  HGB 9.2* 10.1* 10.0* 9.8*  HCT 29.5* 32.1* 30.9* 28.9*  PLT 418* 445 467* 798*    COAGS: No results for input(s): "INR", "APTT" in the last 8760 hours.  BMP: Recent Labs    10/23/21 0940 11/29/21 1737 12/25/21 1622 01/15/22 1035  NA 139 136 136 131*  K 4.8 4.4 4.1 4.3  CL 102 105 106 97*  CO2 '23 27 24 22  '$ GLUCOSE 79 92 106* 113*  BUN '7 9 10 14  '$ CALCIUM 9.3 9.3 9.4 9.9  CREATININE 0.73 0.76 0.68 1.13*  GFRNONAA  --  >60 >60 >60    LIVER FUNCTION TESTS: Recent Labs    12/25/21 1622 01/15/22 1035  BILITOT 0.5 0.8  AST 28 21  ALT 13 12  ALKPHOS 47 60  PROT 8.3* 9.4*  ALBUMIN 3.7 3.5    TUMOR MARKERS: No results for input(s): "AFPTM", "CEA", "CA199", "CHROMGRNA" in the last 8760 hours.  Assessment and Plan: 46 year old female recently diagnosed with endometrial adenocarcinoma presents to IR for tunneled catheter with port placement.  Pt resting on stretcher.  She is A&O, calm and pleasant.  She is in no distress.   Risks and benefits of image guided tunneled catheter with port placement with moderate sedation was discussed with the patient including, but not limited to bleeding, infection, pneumothorax, or fibrin sheath development and need for additional procedures.  All of the patient's questions were answered, patient is agreeable to proceed. Consent signed and in chart.  Thank you for this interesting consult.  I greatly enjoyed meeting Janeane L Rekowski and look forward to participating in their care.  A copy of this report was sent to the requesting provider on this date.  Electronically Signed: Tyson Alias, NP 01/17/2022, 10:48 AM   I spent a total of 20 minutes in face to face in clinical consultation, greater than 50% of which was counseling/coordinating care  for endometrial adenocarcinoma.

## 2022-01-20 ENCOUNTER — Inpatient Hospital Stay: Payer: BC Managed Care – PPO

## 2022-01-20 ENCOUNTER — Encounter: Payer: Self-pay | Admitting: Oncology

## 2022-01-20 ENCOUNTER — Other Ambulatory Visit: Payer: Self-pay

## 2022-01-20 MED ORDER — OXYCODONE HCL 5 MG PO TABS: 5.0000 mg | ORAL_TABLET | ORAL | 0 refills | Status: DC | PRN

## 2022-01-20 MED FILL — Fosaprepitant Dimeglumine For IV Infusion 150 MG (Base Eq): INTRAVENOUS | Qty: 5 | Status: AC

## 2022-01-20 MED FILL — Dexamethasone Sodium Phosphate Inj 100 MG/10ML: INTRAMUSCULAR | Qty: 1 | Status: AC

## 2022-01-20 MED FILL — Ferumoxytol Inj 510 MG/17ML (30 MG/ML) (Elemental Fe): INTRAVENOUS | Qty: 17 | Status: AC

## 2022-01-21 ENCOUNTER — Other Ambulatory Visit: Payer: BC Managed Care – PPO

## 2022-01-21 ENCOUNTER — Inpatient Hospital Stay (HOSPITAL_BASED_OUTPATIENT_CLINIC_OR_DEPARTMENT_OTHER): Payer: BC Managed Care – PPO | Admitting: Oncology

## 2022-01-21 ENCOUNTER — Ambulatory Visit: Payer: BC Managed Care – PPO

## 2022-01-21 ENCOUNTER — Ambulatory Visit: Payer: BC Managed Care – PPO | Admitting: Oncology

## 2022-01-21 ENCOUNTER — Other Ambulatory Visit: Payer: Self-pay

## 2022-01-21 ENCOUNTER — Inpatient Hospital Stay: Payer: BC Managed Care – PPO

## 2022-01-21 ENCOUNTER — Encounter: Payer: Self-pay | Admitting: Oncology

## 2022-01-21 VITALS — BP 167/97 | HR 101 | Resp 18

## 2022-01-21 DIAGNOSIS — Z6841 Body Mass Index (BMI) 40.0 and over, adult: Secondary | ICD-10-CM | POA: Diagnosis not present

## 2022-01-21 DIAGNOSIS — Z86711 Personal history of pulmonary embolism: Secondary | ICD-10-CM | POA: Diagnosis not present

## 2022-01-21 DIAGNOSIS — C541 Malignant neoplasm of endometrium: Secondary | ICD-10-CM | POA: Diagnosis not present

## 2022-01-21 DIAGNOSIS — Z7901 Long term (current) use of anticoagulants: Secondary | ICD-10-CM | POA: Diagnosis not present

## 2022-01-21 DIAGNOSIS — D500 Iron deficiency anemia secondary to blood loss (chronic): Secondary | ICD-10-CM

## 2022-01-21 DIAGNOSIS — D508 Other iron deficiency anemias: Secondary | ICD-10-CM

## 2022-01-21 DIAGNOSIS — Z5111 Encounter for antineoplastic chemotherapy: Secondary | ICD-10-CM

## 2022-01-21 DIAGNOSIS — R918 Other nonspecific abnormal finding of lung field: Secondary | ICD-10-CM | POA: Diagnosis not present

## 2022-01-21 DIAGNOSIS — Z79899 Other long term (current) drug therapy: Secondary | ICD-10-CM | POA: Diagnosis not present

## 2022-01-21 DIAGNOSIS — G893 Neoplasm related pain (acute) (chronic): Secondary | ICD-10-CM | POA: Diagnosis not present

## 2022-01-21 DIAGNOSIS — R5383 Other fatigue: Secondary | ICD-10-CM | POA: Diagnosis not present

## 2022-01-21 DIAGNOSIS — I10 Essential (primary) hypertension: Secondary | ICD-10-CM | POA: Diagnosis not present

## 2022-01-21 DIAGNOSIS — R590 Localized enlarged lymph nodes: Secondary | ICD-10-CM | POA: Diagnosis not present

## 2022-01-21 LAB — CBC WITH DIFFERENTIAL/PLATELET
Abs Immature Granulocytes: 0.08 10*3/uL — ABNORMAL HIGH (ref 0.00–0.07)
Basophils Absolute: 0.1 10*3/uL (ref 0.0–0.1)
Basophils Relative: 1 %
Eosinophils Absolute: 0.1 10*3/uL (ref 0.0–0.5)
Eosinophils Relative: 1 %
HCT: 26.5 % — ABNORMAL LOW (ref 36.0–46.0)
Hemoglobin: 9 g/dL — ABNORMAL LOW (ref 12.0–15.0)
Immature Granulocytes: 1 %
Lymphocytes Relative: 13 %
Lymphs Abs: 1.6 10*3/uL (ref 0.7–4.0)
MCH: 21 pg — ABNORMAL LOW (ref 26.0–34.0)
MCHC: 34 g/dL (ref 30.0–36.0)
MCV: 61.9 fL — ABNORMAL LOW (ref 80.0–100.0)
Monocytes Absolute: 0.8 10*3/uL (ref 0.1–1.0)
Monocytes Relative: 7 %
Neutro Abs: 9.1 10*3/uL — ABNORMAL HIGH (ref 1.7–7.7)
Neutrophils Relative %: 77 %
Platelets: 622 10*3/uL — ABNORMAL HIGH (ref 150–400)
RBC: 4.28 MIL/uL (ref 3.87–5.11)
RDW: 21.4 % — ABNORMAL HIGH (ref 11.5–15.5)
WBC: 11.7 10*3/uL — ABNORMAL HIGH (ref 4.0–10.5)
nRBC: 0 % (ref 0.0–0.2)

## 2022-01-21 LAB — COMPREHENSIVE METABOLIC PANEL
ALT: 9 U/L (ref 0–44)
AST: 22 U/L (ref 15–41)
Albumin: 3.1 g/dL — ABNORMAL LOW (ref 3.5–5.0)
Alkaline Phosphatase: 64 U/L (ref 38–126)
Anion gap: 10 (ref 5–15)
BUN: 16 mg/dL (ref 6–20)
CO2: 25 mmol/L (ref 22–32)
Calcium: 10.6 mg/dL — ABNORMAL HIGH (ref 8.9–10.3)
Chloride: 97 mmol/L — ABNORMAL LOW (ref 98–111)
Creatinine, Ser: 0.99 mg/dL (ref 0.44–1.00)
GFR, Estimated: >60 mL/min (ref >60)
Glucose, Bld: 106 mg/dL — ABNORMAL HIGH (ref 70–99)
Potassium: 4.3 mmol/L (ref 3.5–5.1)
Sodium: 132 mmol/L — ABNORMAL LOW (ref 135–145)
Total Bilirubin: 0.4 mg/dL (ref 0.3–1.2)
Total Protein: 9.4 g/dL — ABNORMAL HIGH (ref 6.5–8.1)

## 2022-01-21 MED ORDER — SODIUM CHLORIDE 0.9% FLUSH
10.0000 mL | Freq: Once | INTRAVENOUS | Status: AC
  Administered 2022-01-21: 10 mL via INTRAVENOUS
  Filled 2022-01-21: qty 10

## 2022-01-21 MED ORDER — PALONOSETRON HCL INJECTION 0.25 MG/5ML
0.2500 mg | Freq: Once | INTRAVENOUS | Status: AC
  Administered 2022-01-21: 0.25 mg via INTRAVENOUS
  Filled 2022-01-21: qty 5

## 2022-01-21 MED ORDER — HEPARIN SOD (PORK) LOCK FLUSH 100 UNIT/ML IV SOLN
500.0000 [IU] | Freq: Once | INTRAVENOUS | Status: AC
  Administered 2022-01-21: 500 [IU] via INTRAVENOUS
  Filled 2022-01-21: qty 5

## 2022-01-21 MED ORDER — SODIUM CHLORIDE 0.9 % IV SOLN
Freq: Once | INTRAVENOUS | Status: AC
  Filled 2022-01-21: qty 250

## 2022-01-21 MED ORDER — SODIUM CHLORIDE 0.9% FLUSH
10.0000 mL | INTRAVENOUS | Status: DC | PRN
  Filled 2022-01-21: qty 10

## 2022-01-21 MED ORDER — SODIUM CHLORIDE 0.9 % IV SOLN
150.0000 mg | Freq: Once | INTRAVENOUS | Status: AC
  Administered 2022-01-21: 150 mg via INTRAVENOUS
  Filled 2022-01-21: qty 150

## 2022-01-21 MED ORDER — SODIUM CHLORIDE 0.9 % IV SOLN
10.0000 mg | Freq: Once | INTRAVENOUS | Status: AC
  Administered 2022-01-21: 10 mg via INTRAVENOUS
  Filled 2022-01-21: qty 10

## 2022-01-21 MED ORDER — LORAZEPAM 2 MG/ML IJ SOLN
0.5000 mg | Freq: Once | INTRAMUSCULAR | Status: AC
  Administered 2022-01-21: 0.5 mg via INTRAVENOUS
  Filled 2022-01-21: qty 1

## 2022-01-21 MED ORDER — SODIUM CHLORIDE 0.9 % IV SOLN
INTRAVENOUS | Status: DC
  Filled 2022-01-21 (×2): qty 250

## 2022-01-21 MED ORDER — SODIUM CHLORIDE 0.9 % IV SOLN
175.0000 mg/m2 | Freq: Once | INTRAVENOUS | Status: DC
  Filled 2022-01-21: qty 67

## 2022-01-21 MED ORDER — DIPHENHYDRAMINE HCL 50 MG/ML IJ SOLN
50.0000 mg | Freq: Once | INTRAMUSCULAR | Status: AC
  Administered 2022-01-21: 50 mg via INTRAVENOUS
  Filled 2022-01-21: qty 1

## 2022-01-21 MED ORDER — SODIUM CHLORIDE 0.9 % IV SOLN
510.0000 mg | INTRAVENOUS | Status: DC
  Administered 2022-01-21: 510 mg via INTRAVENOUS
  Filled 2022-01-21: qty 510

## 2022-01-21 MED ORDER — FAMOTIDINE IN NACL 20-0.9 MG/50ML-% IV SOLN
20.0000 mg | Freq: Once | INTRAVENOUS | Status: AC
  Administered 2022-01-21: 20 mg via INTRAVENOUS
  Filled 2022-01-21: qty 50

## 2022-01-21 MED ORDER — HEPARIN SOD (PORK) LOCK FLUSH 100 UNIT/ML IV SOLN
500.0000 [IU] | Freq: Once | INTRAVENOUS | Status: DC | PRN
  Filled 2022-01-21: qty 5

## 2022-01-21 MED ORDER — SODIUM CHLORIDE 0.9 % IV SOLN
750.0000 mg | Freq: Once | INTRAVENOUS | Status: AC
  Administered 2022-01-21: 750 mg via INTRAVENOUS
  Filled 2022-01-21: qty 75

## 2022-01-21 NOTE — Progress Notes (Signed)
Pt pain is better since getting the pain med

## 2022-01-21 NOTE — Progress Notes (Signed)
BRAF requested on [id number], collected, 12/31/21. Purple top blood specimen sent to complete MSI testing.

## 2022-01-21 NOTE — Patient Instructions (Addendum)
[facility] AT [place]  Discharge Instructions: Thank you for choosing [facility] to provide your oncology and hematology care.  If you have a lab appointment with the [facility], please go directly to the [facility] and check in at the registration area.  Wear comfortable clothing and clothing appropriate for easy access to any Portacath or PICC line.   We strive to give you quality time with your provider. You may need to reschedule your appointment if you arrive late (15 or more minutes).  Arriving late affects you and other patients whose appointments are after yours.  Also, if you miss three or more appointments without notifying the office, you may be dismissed from the clinic at the provider's discretion.      For prescription refill requests, have your pharmacy contact our office and allow 72 hours for refills to be completed.    Today you received the following chemotherapy and/or immunotherapy agents TAXOL and CARBOPLATIN, IVF AND FERAHEME      To help prevent nausea and vomiting after your treatment, we encourage you to take your nausea medication as directed.  BELOW ARE SYMPTOMS THAT SHOULD BE REPORTED IMMEDIATELY: *FEVER GREATER THAN 100.4 F (38 C) OR HIGHER *CHILLS OR SWEATING *NAUSEA AND VOMITING THAT IS NOT CONTROLLED WITH YOUR NAUSEA MEDICATION *UNUSUAL SHORTNESS OF BREATH *UNUSUAL BRUISING OR BLEEDING *URINARY PROBLEMS (pain or burning when urinating, or frequent urination) *BOWEL PROBLEMS (unusual diarrhea, constipation, pain near the anus) TENDERNESS IN MOUTH AND THROAT WITH OR WITHOUT PRESENCE OF ULCERS (sore throat, sores in mouth, or a toothache) UNUSUAL RASH, SWELLING OR PAIN  UNUSUAL VAGINAL DISCHARGE OR ITCHING   Items with * indicate a potential emergency and should be followed up as soon as possible or go to the Emergency Department if any problems should occur.  Please show the CHEMOTHERAPY ALERT CARD or  IMMUNOTHERAPY ALERT CARD at check-in to the Emergency Department and triage nurse.  Should you have questions after your visit or need to cancel or reschedule your appointment, please contact [facility] [place] AT [place]  [phone] and follow the prompts.  Office hours are 8:00 a.m. to 4:30 p.m. Monday - Friday. Please note that voicemails left after 4:00 p.m. may not be returned until the following business day.  We are closed weekends and major holidays. You have access to a nurse at all times for urgent questions. Please call the main number to the clinic [phone] and follow the prompts.  For any non-urgent questions, you may also contact your provider using MyChart. We now offer e-Visits for anyone 86 and older to request care online for non-urgent symptoms. For details visit [email or web address].   Also download the MyChart app! Go to the app store, search "MyChart", open the app, select [facility], and log in with your MyChart username and password.  Masks are optional in the cancer centers. If you would like for your care team to wear a mask while they are taking care of you, please let them know. For doctor visits, patients may have with them one support person who is at least 48 years old. At this time, visitors are not allowed in the infusion area.   Paclitaxel Injection What is this medication? PACLITAXEL (PAK li TAX el) treats some types of cancer. It works by slowing down the growth of cancer cells. This medicine may be used for other purposes; ask your health care provider or pharmacist if you have questions. COMMON BRAND NAME(S): Onxol, Taxol  What should I tell my care team before I take this medication? They need to know if you have any of these conditions: Heart disease Liver disease Low white blood cell levels An unusual or allergic reaction to paclitaxel, other medications, foods, dyes, or preservatives If you or your partner are pregnant or trying  to get pregnant Breast-feeding How should I use this medication? This medication is injected into a vein. It is given by your care team in a hospital or clinic setting. Talk to your care team about the use of this medication in children. While it may be given to children for selected conditions, precautions do apply. Overdosage: If you think you have taken too much of this medicine contact a poison control center or emergency room at once. NOTE: This medicine is only for you. Do not share this medicine with others. What if I miss a dose? Keep appointments for follow-up doses. It is important not to miss your dose. Call your care team if you are unable to keep an appointment. What may interact with this medication? Do not take this medication with any of the following: Live virus vaccines Other medications may affect the way this medication works. Talk with your care team about all of the medications you take. They may suggest changes to your treatment plan to lower the risk of side effects and to make sure your medications work as intended. This list may not describe all possible interactions. Give your health care provider a list of all the medicines, herbs, non-prescription drugs, or dietary supplements you use. Also tell them if you smoke, drink alcohol, or use illegal drugs. Some items may interact with your medicine. What should I watch for while using this medication? Your condition will be monitored carefully while you are receiving this medication. You may need blood work while taking this medication. This medication may make you feel generally unwell. This is not uncommon as chemotherapy can affect healthy cells as well as cancer cells. Report any side effects. Continue your course of treatment even though you feel ill unless your care team tells you to stop. This medication can cause serious allergic reactions. To reduce the risk, your care team may give you other medications to take  before receiving this one. Be sure to follow the directions from your care team. This medication may increase your risk of getting an infection. Call your care team for advice if you get a fever, chills, sore throat, or other symptoms of a cold or flu. Do not treat yourself. Try to avoid being around people who are sick. This medication may increase your risk to bruise or bleed. Call your care team if you notice any unusual bleeding. Be careful brushing or flossing your teeth or using a toothpick because you may get an infection or bleed more easily. If you have any dental work done, tell your dentist you are receiving this medication. Talk to your care team if you may be pregnant. Serious birth defects can occur if you take this medication during pregnancy. Talk to your care team before breastfeeding. Changes to your treatment plan may be needed. What side effects may I notice from receiving this medication? Side effects that you should report to your care team as soon as possible: Allergic reactions--skin rash, itching, hives, swelling of the face, lips, tongue, or throat Heart rhythm changes--fast or irregular heartbeat, dizziness, feeling faint or lightheaded, chest pain, trouble breathing Increase in blood pressure Infection--fever, chills, cough, sore throat, wounds that  don't heal, pain or trouble when passing urine, general feeling of discomfort or being unwell Low blood pressure--dizziness, feeling faint or lightheaded, blurry vision Low red blood cell level--unusual weakness or fatigue, dizziness, headache, trouble breathing Painful swelling, warmth, or redness of the skin, blisters or sores at the infusion site Pain, tingling, or numbness in the hands or feet Slow heartbeat--dizziness, feeling faint or lightheaded, confusion, trouble breathing, unusual weakness or fatigue Unusual bruising or bleeding Side effects that usually do not require medical attention (report to your care team if  they continue or are bothersome): Diarrhea Hair loss Joint pain Loss of appetite Muscle pain Nausea Vomiting This list may not describe all possible side effects. Call your doctor for medical advice about side effects. You may report side effects to FDA at 1-800-FDA-1088. Where should I keep my medication? This medication is given in a hospital or clinic. It will not be stored at home. NOTE: This sheet is a summary. It may not cover all possible information. If you have questions about this medicine, talk to your doctor, pharmacist, or health care provider.  2023 Elsevier/Gold Standard (2021-06-12 00:00:00)  Carboplatin Injection What is this medication? CARBOPLATIN (KAR boe pla tin) treats some types of cancer. It works by slowing down the growth of cancer cells. This medicine may be used for other purposes; ask your health care provider or pharmacist if you have questions. COMMON BRAND NAME(S): Paraplatin What should I tell my care team before I take this medication? They need to know if you have any of these conditions: Blood disorders Hearing problems Kidney disease Recent or ongoing radiation therapy An unusual or allergic reaction to carboplatin, cisplatin, other medications, foods, dyes, or preservatives Pregnant or trying to get pregnant Breast-feeding How should I use this medication? This medication is injected into a vein. It is given by your care team in a hospital or clinic setting. Talk to your care team about the use of this medication in children. Special care may be needed. Overdosage: If you think you have taken too much of this medicine contact a poison control center or emergency room at once. NOTE: This medicine is only for you. Do not share this medicine with others. What if I miss a dose? Keep appointments for follow-up doses. It is important not to miss your dose. Call your care team if you are unable to keep an appointment. What may interact with this  medication? Medications for seizures Some antibiotics, such as amikacin, gentamicin, neomycin, streptomycin, tobramycin Vaccines This list may not describe all possible interactions. Give your health care provider a list of all the medicines, herbs, non-prescription drugs, or dietary supplements you use. Also tell them if you smoke, drink alcohol, or use illegal drugs. Some items may interact with your medicine. What should I watch for while using this medication? Your condition will be monitored carefully while you are receiving this medication. You may need blood work while taking this medication. This medication may make you feel generally unwell. This is not uncommon, as chemotherapy can affect healthy cells as well as cancer cells. Report any side effects. Continue your course of treatment even though you feel ill unless your care team tells you to stop. In some cases, you may be given additional medications to help with side effects. Follow all directions for their use. This medication may increase your risk of getting an infection. Call your care team for advice if you get a fever, chills, sore throat, or other symptoms of a  cold or flu. Do not treat yourself. Try to avoid being around people who are sick. Avoid taking medications that contain aspirin, acetaminophen, ibuprofen, naproxen, or ketoprofen unless instructed by your care team. These medications may hide a fever. Be careful brushing or flossing your teeth or using a toothpick because you may get an infection or bleed more easily. If you have any dental work done, tell your dentist you are receiving this medication. Talk to your care team if you wish to become pregnant or think you might be pregnant. This medication can cause serious birth defects. Talk to your care team about effective forms of contraception. Do not breast-feed while taking this medication. What side effects may I notice from receiving this medication? Side effects  that you should report to your care team as soon as possible: Allergic reactions--skin rash, itching, hives, swelling of the face, lips, tongue, or throat Infection--fever, chills, cough, sore throat, wounds that don't heal, pain or trouble when passing urine, general feeling of discomfort or being unwell Low red blood cell level--unusual weakness or fatigue, dizziness, headache, trouble breathing Pain, tingling, or numbness in the hands or feet, muscle weakness, change in vision, confusion or trouble speaking, loss of balance or coordination, trouble walking, seizures Unusual bruising or bleeding Side effects that usually do not require medical attention (report to your care team if they continue or are bothersome): Hair loss Nausea Unusual weakness or fatigue Vomiting This list may not describe all possible side effects. Call your doctor for medical advice about side effects. You may report side effects to FDA at 1-800-FDA-1088. Where should I keep my medication? This medication is given in a hospital or clinic. It will not be stored at home. NOTE: This sheet is a summary. It may not cover all possible information. If you have questions about this medicine, talk to your doctor, pharmacist, or health care provider.  2023 Elsevier/Gold Standard (2021-05-27 00:00:00)  Ferumoxytol Injection What is this medication? FERUMOXYTOL (FER ue MOX i tol) treats low levels of iron in your body (iron deficiency anemia). Iron is a mineral that plays an important role in making red blood cells, which carry oxygen from your lungs to the rest of your body. This medicine may be used for other purposes; ask your health care provider or pharmacist if you have questions. COMMON BRAND NAME(S): Feraheme What should I tell my care team before I take this medication? They need to know if you have any of these conditions: Anemia not caused by low iron levels High levels of iron in the blood Magnetic resonance  imaging (MRI) test scheduled An unusual or allergic reaction to iron, other medications, foods, dyes, or preservatives Pregnant or trying to get pregnant Breastfeeding How should I use this medication? This medication is injected into a vein. It is given by your care team in a hospital or clinic setting. Talk to your care team the use of this medication in children. Special care may be needed. Overdosage: If you think you have taken too much of this medicine contact a poison control center or emergency room at once. NOTE: This medicine is only for you. Do not share this medicine with others. What if I miss a dose? It is important not to miss your dose. Call your care team if you are unable to keep an appointment. What may interact with this medication? Other iron products This list may not describe all possible interactions. Give your health care provider a list of all the medicines,  herbs, non-prescription drugs, or dietary supplements you use. Also tell them if you smoke, drink alcohol, or use illegal drugs. Some items may interact with your medicine. What should I watch for while using this medication? Visit your care team regularly. Tell your care team if your symptoms do not start to get better or if they get worse. You may need blood work done while you are taking this medication. You may need to follow a special diet. Talk to your care team. Foods that contain iron include: whole grains/cereals, dried fruits, beans, or peas, leafy green vegetables, and organ meats (liver, kidney). What side effects may I notice from receiving this medication? Side effects that you should report to your care team as soon as possible: Allergic reactions--skin rash, itching, hives, swelling of the face, lips, tongue, or throat Low blood pressure--dizziness, feeling faint or lightheaded, blurry vision Shortness of breath Side effects that usually do not require medical attention (report to your care team if  they continue or are bothersome): Flushing Headache Joint pain Muscle pain Nausea Pain, redness, or irritation at injection site This list may not describe all possible side effects. Call your doctor for medical advice about side effects. You may report side effects to FDA at 1-800-FDA-1088. Where should I keep my medication? This medication is given in a hospital or clinic and will not be stored at home. NOTE: This sheet is a summary. It may not cover all possible information. If you have questions about this medicine, talk to your doctor, pharmacist, or health care provider.  2023 Elsevier/Gold Standard (2020-07-04 00:00:00)

## 2022-01-21 NOTE — Progress Notes (Signed)
Hematology/Oncology Consult note [facility]  Telephone:([phone] [phone]  Patient Care Team: Patient, No Pcp Per as PCP - General (General Practice) [name], RN as Oncology Nurse Navigator   Name of the patient: Jean Morris  [id number]  03/13/1975   Date of visit: 01/21/22  Diagnosis-stage IVb endometrioid endometrial cancer  Chief complaint/ Reason for visit-on treatment assessment prior to cycle 1 of CarboTaxol chemotherapy  Heme/Onc history:  Patient is a 46 year old female who has seen [name] for irregular menstrual bleeding.  He has had an IUD in place and initially it was attribute it to use of IUD and was tried to be controlled with progesterone agents.  However due to worsening abdominal pain patient underwent a CT abdomen and pelvis with contrast on 12/25/2021 which showed 8 mm   Posterior lung base mass.  Retroperitoneal adenopathy measuring 2.6 cm additional retrocrural adenopathy.  Faint lucent lesion involving L2 vertebral body.  The uterus is anteverted and enlarged.  Endometrium is enlarged heterogeneous and irregular extending to the fundal myometrium.  Findings consistent with endometrial malignancy.  CT chest without contrast showed a 10 mm right lower lobe subpleural nodule.   Patient had endometrial biopsy which was consistent Fiegel grade 2 endometrioid endometrial carcinoma.  Patient was seen by GYN oncology and hopes to take her for surgery if she has good response to neoadjuvant chemotherapy.   Patient lives with her partner and has 2 adult children.  She reports significant flank pain as well as abdominal pain.  Ongoing vaginal bleeding.  She reports significant fatigue.  She has a prior history of pulmonary embolism in 2017 that was attributed to sequels for which she took anticoagulation for 1 year and stopped.  Interval history-abdominal pain is better controlled with oxycodone and she is using about 3 doses  per day.  She is also taking MiraLAX and senna for opioid-induced constipation.  She is yet to start taking her prophylactic Eliquis.  Continues to have intermittent vaginal bleeding.  ECOG PS- 1 Pain scale- 2 Opioid associated constipation- no  Review of systems- Review of Systems  Constitutional:  Positive for malaise/fatigue.  Genitourinary:        Vaginal bleeding      Allergies  Allergen Reactions   Bee Pollen Nausea And Vomiting    Itchy, swelling, watery eyes, runny nose.   Pollen Extract Nausea And Vomiting    Itchy, swelling, watery eyes, runny nose.     Past Medical History:  Diagnosis Date   Anemia    Hypertension    Menorrhagia    Pulmonary embolism [facility])      Past Surgical History:  Procedure Laterality Date   CESAREAN SECTION  1994   COLONOSCOPY WITH PROPOFOL N/A 08/06/2021   Procedure: COLONOSCOPY WITH PROPOFOL;  Surgeon: Jonathon Bellows, MD;  Location: [facility] ENDOSCOPY;  Service: Gastroenterology;  Laterality: N/A;   ESOPHAGOGASTRODUODENOSCOPY (EGD) WITH PROPOFOL N/A 02/09/2020   Procedure: ESOPHAGOGASTRODUODENOSCOPY (EGD) WITH PROPOFOL;  Surgeon: Jonathon Bellows, MD;  Location: [facility] ENDOSCOPY;  Service: Gastroenterology;  Laterality: N/A;   IR IMAGING GUIDED PORT INSERTION  01/17/2022    Social History   Socioeconomic History   Marital status: Soil scientist    Spouse name: Not on file   Number of children: Not on file   Years of education: Not on file   Highest education level: Not on file  Occupational History   Not on file  Tobacco Use   Smoking status: Never  Smokeless tobacco: Never  Vaping Use   Vaping Use: Never used  Substance and Sexual Activity   Alcohol use: No   Drug use: No   Sexual activity: Not Currently    Birth control/protection: I.U.D.    Comment: patient states MD said did not see in CT today 01/17/2022  Other Topics Concern   Not on file  Social History Narrative   Lives with [facility], partner, and no pets.   Social  Determinants of Health   Financial Resource Strain: Not on file  Food Insecurity: Not on file  Transportation Needs: Not on file  Physical Activity: Not on file  Stress: Not on file  Social Connections: Not on file  Intimate Partner Violence: Not on file    Family History  Problem Relation Age of Onset   Hypertension Mother    Stroke Mother    Heart attack Mother    Cancer Father    Hypertension Father    Cancer Sister    Cancer Sister    Cancer Brother    Hypertension Other      Current Outpatient Medications:    apixaban (ELIQUIS) 2.5 MG TABS tablet, Take 1 tablet (2.5 mg total) by mouth 2 (two) times daily., Disp: 60 tablet, Rfl: 3   dexamethasone (DECADRON) 4 MG tablet, Take 2 tablets by mouth starting the day after chemotherapy for 3 days. Take with food., Disp: 30 tablet, Rfl: 1   ferrous sulfate 325 (65 FE) MG tablet, Take 1 tablet (325 mg total) by mouth 2 (two) times daily with a meal. (Patient taking differently: Take 325 mg by mouth 3 (three) times daily with meals.), Disp: 90 tablet, Rfl: 7   lidocaine-prilocaine (EMLA) cream, Apply to affected area once, Disp: 30 g, Rfl: 3   omeprazole (PRILOSEC) 40 MG capsule, Take 1 capsule (40 mg total) by mouth daily. For 6 weeks and then stop., Disp: 42 capsule, Rfl: 0   ondansetron (ZOFRAN) 8 MG tablet, Take 1 tablet (8 mg total) by mouth every 8 (eight) hours as needed for nausea or vomiting. Start on the third day after chemotherapy., Disp: 30 tablet, Rfl: 1   oxyCODONE (OXY IR/ROXICODONE) 5 MG immediate release tablet, Take 1 tablet (5 mg total) by mouth every 4 (four) hours as needed for severe pain., Disp: 120 tablet, Rfl: 0   prochlorperazine (COMPAZINE) 10 MG tablet, Take 1 tablet (10 mg total) by mouth every 6 (six) hours as needed for nausea or vomiting., Disp: 30 tablet, Rfl: 1   levonorgestrel (MIRENA) 20 MCG/24HR IUD, 1 Intra Uterine Device (1 each total) by Intrauterine route once. (Patient not taking: Reported on  01/15/2022), Disp: 1 each, Rfl: 0   polyethylene glycol-electrolytes (NULYTELY) 420 g solution, Drink one 8 oz glass of mixture every 15 minutes until you finish all of the jug. (Patient not taking: Reported on 01/15/2022), Disp: 4000 mL, Rfl: 0 No current facility-administered medications for this visit.  Facility-Administered Medications Ordered in Other Visits:    0.9 %  sodium chloride infusion, , Intravenous, Continuous, Sindy Guadeloupe, MD, Stopped at 01/21/22 1110   CARBOplatin (PARAPLATIN) 750 mg in sodium chloride 0.9 % 250 mL chemo infusion, 750 mg, Intravenous, Once, Sindy Guadeloupe, MD   ferumoxytol [facility]) 510 mg in sodium chloride 0.9 % 100 mL IVPB, 510 mg, Intravenous, Weekly, Sindy Guadeloupe, MD, Last Rate: 468 mL/hr at 01/21/22 1004, 510 mg at 01/21/22 1004   heparin lock flush 100 unit/mL, 500 Units, Intravenous, Once, Sindy Guadeloupe,  MD   heparin lock flush 100 unit/mL, 500 Units, Intracatheter, Once PRN, Sindy Guadeloupe, MD   PACLitaxel (TAXOL) 402 mg in sodium chloride 0.9 % 500 mL chemo infusion (> 14m/m2), 175 mg/m2 (Treatment Plan Recorded), Intravenous, Once, RSindy Guadeloupe MD, Last Rate: 84 mL/hr at 01/21/22 1246, Rate Change at 01/21/22 1246   sodium chloride flush (NS) 0.9 % injection 10 mL, 10 mL, Intracatheter, PRN, RSindy Guadeloupe MD  Physical exam: There were no vitals filed for this visit. Physical Exam Constitutional:      General: She is not in acute distress.    Appearance: She is obese.  Cardiovascular:     Rate and Rhythm: Normal rate and regular rhythm.     Heart sounds: Normal heart sounds.  Pulmonary:     Effort: Pulmonary effort is normal.     Breath sounds: Normal breath sounds.  Skin:    General: Skin is warm and dry.  Neurological:     Mental Status: She is alert and oriented to person, place, and time.         Latest Ref Rng & Units 01/21/2022    8:11 AM  CMP  Glucose 70 - 99 mg/dL 106   BUN 6 - 20 mg/dL 16   Creatinine 0.44 - 1.00  mg/dL 0.99   Sodium 135 - 145 mmol/L 132   Potassium 3.5 - 5.1 mmol/L 4.3   Chloride 98 - 111 mmol/L 97   CO2 22 - 32 mmol/L 25   Calcium 8.9 - 10.3 mg/dL 10.6   Total Protein 6.5 - 8.1 g/dL 9.4   Total Bilirubin 0.3 - 1.2 mg/dL 0.4   Alkaline Phos 38 - 126 U/L 64   AST 15 - 41 U/L 22   ALT 0 - 44 U/L 9       Latest Ref Rng & Units 01/21/2022    8:11 AM  CBC  WBC 4.0 - 10.5 K/uL 11.7   Hemoglobin 12.0 - 15.0 g/dL 9.0   Hematocrit 36.0 - 46.0 % 26.5   Platelets 150 - 400 K/uL 622     No images are attached to the encounter.  IR IMAGING GUIDED PORT INSERTION  Result Date: 01/17/2022 CLINICAL DATA:  ENDOMETRIAL CARCINOMA, ACCESS FOR CHEMOTHERAPY EXAM: RIGHT INTERNAL JUGULAR SINGLE LUMEN POWER PORT CATHETER INSERTION Date:  01/17/2022 01/17/2022 11:53 am Radiologist:  M. TDaryll Brod MD Guidance:  Ultrasound and fluoroscopic MEDICATIONS: 1% lidocaine local with epinephrine ANESTHESIA/SEDATION: Versed 2.0 mg IV; Fentanyl 100 mcg IV; Moderate Sedation Time:  25 minute The patient was continuously monitored during the procedure by the interventional radiology nurse under my direct supervision. FLUOROSCOPY: One minutes, 24 seconds (24 mGy) COMPLICATIONS: None immediate. CONTRAST:  None. PROCEDURE: Informed consent was obtained from the patient following explanation of the procedure, risks, benefits and alternatives. The patient understands, agrees and consents for the procedure. All questions were addressed. A time out was performed. Maximal barrier sterile technique utilized including caps, mask, sterile gowns, sterile gloves, large sterile drape, hand hygiene, and 2% chlorhexidine scrub. Under sterile conditions and local anesthesia, right internal jugular micropuncture venous access was performed. Access was performed with ultrasound. Images were obtained for documentation of the patent right internal jugular vein. A guide wire was inserted followed by a transitional dilator. This allowed  insertion of a guide wire and catheter into the IVC. Measurements were obtained from the SVC / RA junction back to the right IJ venotomy site. In the right infraclavicular chest, a subcutaneous pocket  was created over the second anterior rib. This was done under sterile conditions and local anesthesia. 1% lidocaine with epinephrine was utilized for this. A 2.5 cm incision was made in the skin. Blunt dissection was performed to create a subcutaneous pocket over the right pectoralis major muscle. The pocket was flushed with saline vigorously. There was adequate hemostasis. The port catheter was assembled and checked for leakage. The port catheter was secured in the pocket with two retention sutures. The tubing was tunneled subcutaneously to the right venotomy site and inserted into the SVC/RA junction through a valved peel-away sheath. Position was confirmed with fluoroscopy. Images were obtained for documentation. The patient tolerated the procedure well. No immediate complications. Incisions were closed in a two layer fashion with 4 - 0 Vicryl suture. Dermabond was applied to the skin. The port catheter was accessed, blood was aspirated followed by saline and heparin flushes. Needle was removed. A dry sterile dressing was applied. IMPRESSION: Ultrasound and fluoroscopically guided right internal jugular single lumen power port catheter insertion. Tip in the SVC/RA junction. Catheter ready for use. Electronically Signed   By: Jerilynn Mages.  Shick M.D.   On: 01/17/2022 12:02   US PELVIS (TRANSABDOMINAL ONLY)  Result Date: 01/12/2022 Patient Name: Kaileigh L Carns DOB: 1975/10/30 MRN: [id number] LMP: unknown ULTRASOUND REPORT Location: [facility] Date of Service: 01/06/2022 Indications: f/u u/s and CT scan, thickened endometrium, AUB Findings: The uterus is enlarged, anteverted and measures 19.4 x 14.1 x 11.4 cm. Echo texture is heterogenous without evidence of focal masses. The Endometrium measures 82.5 mm -  irregular margins - hypervascular Right Ovary is not visualized Left Ovary is not visualized Survey of the adnexa demonstrates no adnexal masses. There is no free fluid in the cul de sac. Impression: 1. Enlarged uterus 2. Thick, hypervascular, irregular endometrium 3. Ovaries are not visualized Recommendations: 1.Clinical correlation with the patient's History and Physical Exam. Edwena Bunde, RDMS, RVT I have reviewed this study and agree with documented findings. Rubie Maid, MD [facility] [place]  US PELVIS TRANSVAGINAL NON-OB (TV ONLY)  Result Date: 01/12/2022 Patient Name: Saidi L Porada DOB: December 14, 1975 MRN: [id number] LMP: unknown ULTRASOUND REPORT Location: [facility] Date of Service: 01/06/2022 Indications: f/u u/s and CT scan, thickened endometrium, AUB Findings: The uterus is enlarged, anteverted and measures 19.4 x 14.1 x 11.4 cm. Echo texture is heterogenous without evidence of focal masses. The Endometrium measures 82.5 mm - irregular margins - hypervascular Right Ovary is not visualized Left Ovary is not visualized Survey of the adnexa demonstrates no adnexal masses. There is no free fluid in the cul de sac. Impression: 1. Enlarged uterus 2. Thick, hypervascular, irregular endometrium 3. Ovaries are not visualized Recommendations: 1.Clinical correlation with the patient's History and Physical Exam. Edwena Bunde, RDMS, RVT I have reviewed this study and agree with documented findings. Rubie Maid, MD [facility] [place]  US PELVIC COMPLETE W TRANSVAGINAL AND TORSION R/O  Result Date: 12/25/2021 CLINICAL DATA:  Pelvic pain, abnormal CT with abnormal vaginal bleeding for months. EXAM: TRANSABDOMINAL AND TRANSVAGINAL ULTRASOUND OF PELVIS DOPPLER ULTRASOUND OF OVARIES TECHNIQUE: Both transabdominal and transvaginal ultrasound examinations of the pelvis were performed. Transabdominal technique was performed for global imaging of the pelvis including  uterus, ovaries, adnexal regions, and pelvic cul-de-sac. It was necessary to proceed with endovaginal exam following the transabdominal exam to visualize the endometrium. Color and duplex Doppler ultrasound was utilized to evaluate blood flow to the ovaries. COMPARISON:  CT evaluation acquired on  the same date and remote CT imaging. FINDINGS: Uterus Measurements: 19.0 x 13.3 x 15.0 cm = volume: 1,974 mL. Distinct endometrium is not visible. Heterogeneous area which may be surrounded by myometrium may be as much as 7 cm in greatest thickness. Endometrium Thickness: Perhaps as thick as 75 mm. Markedly heterogeneous with signs of internal flow. Right ovary RIGHT ovary is not visualized. Left ovary Measurements: 5.8 x 3.1 x 4.2 cm = volume: 39 mL. Simple dominant follicle measuring up to 3.5 cm accounts for much of LEFT ovarian dimension. Pulsed Doppler evaluation shows low resistance arterial and venous flow within the LEFT ovary. Other findings Small free fluid in the pelvis. IMPRESSION: 1. Marked heterogeneity and thickening of the endometrium with signs of internal flow, highly suggestive of if not diagnostic for endometrial neoplasm. Gyn consultation is suggested if not yet obtained in this patient with documented signs of metastatic adenopathy involving the chest and abdomen on prior CT imaging. 2. Grossly normal LEFT ovary though LEFT ovarian vein shows tumor thrombus on CT imaging. 3. Nonvisualization of the RIGHT ovary. 4. Small free fluid in the pelvis. Electronically Signed   By: Zetta Bills M.D.   On: 12/25/2021 21:10   CT CHEST WO CONTRAST  Result Date: 12/25/2021 CLINICAL DATA:  Right lower lobe lung nodule partially visualized on prior abdominal CT. EXAM: CT CHEST WITHOUT CONTRAST TECHNIQUE: Multidetector CT imaging of the chest was performed following the standard protocol without IV contrast. RADIATION DOSE REDUCTION: This exam was performed according to the departmental dose-optimization program  which includes automated exposure control, adjustment of the mA and/or kV according to patient size and/or use of iterative reconstruction technique. COMPARISON:  10/24/2017 FINDINGS: Cardiovascular: Heart is normal size. Aorta is normal caliber. Mediastinum/Nodes: No mediastinal, hilar, or axillary adenopathy. Trachea and esophagus are unremarkable. Thyroid unremarkable. Lungs/Pleura: Posterior right lower lobe pulmonary nodule measures 10 mm. Mild surrounding ground-glass density. No additional pulmonary nodules, confluent opacities or effusions. Upper Abdomen: CT earlier abdominal CT report Musculoskeletal: Chest wall soft tissues are unremarkable. No acute bony abnormality. IMPRESSION: Posterior right lower lobe subpleural nodule measuring 10 mm with surrounding ground-glass opacity. Consider one of the following in 3 months for both low-risk and high-risk individuals: (a) repeat chest CT, (b) follow-up PET-CT, or (c) tissue sampling. This recommendation follows the consensus statement: Guidelines for Management of Incidental Pulmonary Nodules Detected on CT Images: From the Fleischner Society 2017; Radiology 2017; 284:228-243. Electronically Signed   By: Rolm Baptise M.D.   On: 12/25/2021 19:44   CT Abdomen Pelvis W Contrast  Result Date: 12/25/2021 CLINICAL DATA:  Left lower quadrant abdominal pain. EXAM: CT ABDOMEN AND PELVIS WITH CONTRAST TECHNIQUE: Multidetector CT imaging of the abdomen and pelvis was performed using the standard protocol following bolus administration of intravenous contrast. RADIATION DOSE REDUCTION: This exam was performed according to the departmental dose-optimization program which includes automated exposure control, adjustment of the mA and/or kV according to patient size and/or use of iterative reconstruction technique. CONTRAST:  116m OMNIPAQUE IOHEXOL 350 MG/ML SOLN COMPARISON:  CT abdomen pelvis dated 11/19/2016. FINDINGS: Lower chest: Partially visualized right posterior  lung base subpleural nodule measuring 8 mm (11/4) and new since the prior CT and suspicious for metastatic disease. Further evaluation with chest CT recommended. The visualized lung bases are otherwise clear. No intra-abdominal free air or free fluid. Hepatobiliary: Fatty liver. No biliary dilatation. The gallbladder is unremarkable Pancreas: Unremarkable. No pancreatic ductal dilatation or surrounding inflammatory changes. Spleen: Normal in size without focal  abnormality. Adrenals/Urinary Tract: The adrenal glands are unremarkable. The kidneys, visualized ureters, and urinary bladder appear unremarkable. Stomach/Bowel: There is moderate stool throughout the colon. There is no bowel obstruction or active inflammation. The appendix is normal. Vascular/Lymphatic: The abdominal aorta and IVC are unremarkable. No portal venous gas. Retroperitoneal adenopathy measure 2.6 cm short axis to the left of the aorta, 1.8 cm at the level of the left renal hilum, and 2.2 cm in the cavoatrial region. Additional retrocrural adenopathy to the right of the descending thoracic aorta measure 1.6 cm. Reproductive: The uterus is anteverted and enlarged. The endometrium is enlarged heterogeneous and irregular extending to the fundal myometrium1. 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 Gynecology consult is advised. There is a 3 cm left ovarian cyst. The right ovary is unremarkable. Other: None Musculoskeletal: Faint lucent lesion involving the left L2 vertebra suspicious for metastasis. IMPRESSION: 1. 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 Gynecology consult is advised. 2. Retroperitoneal and retrocrural adenopathy. 3. Partially visualized 8 mm right posterior lung base subpleural nodule suspicious for metastatic disease. Further evaluation with chest CT recommended. 4. Faint lucent lesion involving the left L2 vertebra suspicious for metastasis. 5. Fatty liver. 6. No bowel obstruction. Normal appendix.  Electronically Signed   By: Anner Crete M.D.   On: 12/25/2021 18:39     Assessment and plan- Patient is a 46 y.o. female with stage IV endometrioid endometrial carcinoma T2 N2 M1 here for on treatment assessment prior to cycle 1 of CarboTaxol chemotherapy  Counts okay to proceed with cycle 1 of CarboTaxol chemotherapy today.  I will see her back in 3 weeks for cycle 2.  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  We will be checking BRAF to see if this is sporadic versus germline.  I will therefore add Keytruda to her regimen starting cycle 2.  Discussed risks and benefits of Keytruda including all but not limited to autoimmune side effects chest colitis pneumonitis, endocrinopathies and the need to monitor kidney and liver functions.  Treatment will be given with a palliative intent.  Patient understands and agrees to proceed as planned.  Iron deficiency anemia: She will receive 1 dose of Feraheme today and second dose in 1 week  She will be seen by covering NP in about a week to 10 days for possible IV fluids and Feraheme  Patient has been started on prophylactic Eliquis 2.5 mg twice daily given her increased risk of thrombosis with her underlying malignancy.  She will start taking it from today.  Neoplasm related pain: Continue as needed oxycodone   Visit Diagnosis 1. Endometrial cancer ([place])   2. Encounter for antineoplastic chemotherapy   3. Iron deficiency anemia due to chronic blood loss      Dr. Randa Evens, MD, MPH [facility] at [facility] [phone] 01/21/2022 1:04 PM

## 2022-01-22 ENCOUNTER — Ambulatory Visit: Payer: BC Managed Care – PPO | Admitting: Oncology

## 2022-01-22 ENCOUNTER — Telehealth: Payer: Self-pay

## 2022-01-22 ENCOUNTER — Ambulatory Visit: Payer: BC Managed Care – PPO

## 2022-01-22 ENCOUNTER — Other Ambulatory Visit: Payer: BC Managed Care – PPO

## 2022-01-22 NOTE — Telephone Encounter (Signed)
Telephone call to patient for follow up after receiving first infusion.   Patient states infusion went great.  States eating good and drinking plenty of fluids.   Denies any nausea or vomiting.  Encouraged patient to call for any concerns or questions. 

## 2022-01-24 ENCOUNTER — Encounter: Payer: Self-pay | Admitting: Oncology

## 2022-01-26 ENCOUNTER — Encounter: Payer: Self-pay | Admitting: Oncology

## 2022-01-27 ENCOUNTER — Other Ambulatory Visit: Payer: Self-pay | Admitting: *Deleted

## 2022-01-27 ENCOUNTER — Other Ambulatory Visit: Payer: Self-pay

## 2022-01-27 ENCOUNTER — Encounter: Payer: Self-pay | Admitting: *Deleted

## 2022-01-27 MED ORDER — CLOTRIMAZOLE-BETAMETHASONE 1-0.05 % EX CREA: 1.0000 | TOPICAL_CREAM | Freq: Two times a day (BID) | CUTANEOUS | 0 refills | Status: DC

## 2022-01-27 MED ORDER — NYSTATIN 100000 UNIT/GM EX POWD: 1.0000 | Freq: Three times a day (TID) | CUTANEOUS | 0 refills | Status: DC

## 2022-01-27 NOTE — Telephone Encounter (Signed)
Sounds fungal dermatities. Topical antifungal powder like clotrimazole

## 2022-01-28 ENCOUNTER — Ambulatory Visit: Payer: BC Managed Care – PPO

## 2022-01-28 ENCOUNTER — Ambulatory Visit: Payer: BC Managed Care – PPO | Admitting: Oncology

## 2022-01-28 ENCOUNTER — Other Ambulatory Visit: Payer: BC Managed Care – PPO

## 2022-01-29 ENCOUNTER — Inpatient Hospital Stay: Payer: BC Managed Care – PPO | Attending: Obstetrics and Gynecology

## 2022-01-29 ENCOUNTER — Telehealth: Payer: Self-pay | Admitting: *Deleted

## 2022-01-29 ENCOUNTER — Inpatient Hospital Stay (HOSPITAL_BASED_OUTPATIENT_CLINIC_OR_DEPARTMENT_OTHER): Payer: BC Managed Care – PPO | Admitting: Medical Oncology

## 2022-01-29 ENCOUNTER — Other Ambulatory Visit: Payer: Self-pay

## 2022-01-29 ENCOUNTER — Inpatient Hospital Stay: Payer: BC Managed Care – PPO

## 2022-01-29 VITALS — BP 121/93 | Temp 98.6°F | Ht 65.0 in | Wt 244.8 lb

## 2022-01-29 VITALS — BP 127/91 | HR 99

## 2022-01-29 DIAGNOSIS — R590 Localized enlarged lymph nodes: Secondary | ICD-10-CM | POA: Insufficient documentation

## 2022-01-29 DIAGNOSIS — Z7901 Long term (current) use of anticoagulants: Secondary | ICD-10-CM | POA: Diagnosis not present

## 2022-01-29 DIAGNOSIS — D649 Anemia, unspecified: Secondary | ICD-10-CM

## 2022-01-29 DIAGNOSIS — Z86711 Personal history of pulmonary embolism: Secondary | ICD-10-CM | POA: Diagnosis not present

## 2022-01-29 DIAGNOSIS — T451X5A Adverse effect of antineoplastic and immunosuppressive drugs, initial encounter: Secondary | ICD-10-CM | POA: Insufficient documentation

## 2022-01-29 DIAGNOSIS — C541 Malignant neoplasm of endometrium: Secondary | ICD-10-CM

## 2022-01-29 DIAGNOSIS — D509 Iron deficiency anemia, unspecified: Secondary | ICD-10-CM | POA: Diagnosis not present

## 2022-01-29 DIAGNOSIS — D508 Other iron deficiency anemias: Secondary | ICD-10-CM | POA: Diagnosis not present

## 2022-01-29 DIAGNOSIS — R5383 Other fatigue: Secondary | ICD-10-CM | POA: Diagnosis not present

## 2022-01-29 DIAGNOSIS — Z79899 Other long term (current) drug therapy: Secondary | ICD-10-CM | POA: Diagnosis not present

## 2022-01-29 DIAGNOSIS — G893 Neoplasm related pain (acute) (chronic): Secondary | ICD-10-CM | POA: Insufficient documentation

## 2022-01-29 DIAGNOSIS — R918 Other nonspecific abnormal finding of lung field: Secondary | ICD-10-CM | POA: Diagnosis not present

## 2022-01-29 DIAGNOSIS — Z5111 Encounter for antineoplastic chemotherapy: Secondary | ICD-10-CM | POA: Insufficient documentation

## 2022-01-29 DIAGNOSIS — I10 Essential (primary) hypertension: Secondary | ICD-10-CM | POA: Diagnosis not present

## 2022-01-29 LAB — CBC WITH DIFFERENTIAL/PLATELET
Abs Immature Granulocytes: 0.01 10*3/uL (ref 0.00–0.07)
Basophils Absolute: 0 10*3/uL (ref 0.0–0.1)
Basophils Relative: 1 %
Eosinophils Absolute: 0.1 10*3/uL (ref 0.0–0.5)
Eosinophils Relative: 8 %
HCT: 23.5 % — ABNORMAL LOW (ref 36.0–46.0)
Hemoglobin: 7.9 g/dL — ABNORMAL LOW (ref 12.0–15.0)
Immature Granulocytes: 1 %
Lymphocytes Relative: 47 %
Lymphs Abs: 0.7 10*3/uL (ref 0.7–4.0)
MCH: 20.7 pg — ABNORMAL LOW (ref 26.0–34.0)
MCHC: 33.6 g/dL (ref 30.0–36.0)
MCV: 61.7 fL — ABNORMAL LOW (ref 80.0–100.0)
Monocytes Absolute: 0.2 10*3/uL (ref 0.1–1.0)
Monocytes Relative: 11 %
Neutro Abs: 0.5 10*3/uL — ABNORMAL LOW (ref 1.7–7.7)
Neutrophils Relative %: 32 %
Platelets: 257 10*3/uL (ref 150–400)
RBC: 3.81 MIL/uL — ABNORMAL LOW (ref 3.87–5.11)
RDW: 19.3 % — ABNORMAL HIGH (ref 11.5–15.5)
Smear Review: NORMAL
WBC: 1.5 10*3/uL — ABNORMAL LOW (ref 4.0–10.5)
nRBC: 0 % (ref 0.0–0.2)

## 2022-01-29 LAB — COMPREHENSIVE METABOLIC PANEL
ALT: 17 U/L (ref 0–44)
AST: 25 U/L (ref 15–41)
Albumin: 3.1 g/dL — ABNORMAL LOW (ref 3.5–5.0)
Alkaline Phosphatase: 49 U/L (ref 38–126)
Anion gap: 11 (ref 5–15)
BUN: 12 mg/dL (ref 6–20)
CO2: 22 mmol/L (ref 22–32)
Calcium: 8.9 mg/dL (ref 8.9–10.3)
Chloride: 98 mmol/L (ref 98–111)
Creatinine, Ser: 0.8 mg/dL (ref 0.44–1.00)
GFR, Estimated: >60 mL/min (ref >60)
Glucose, Bld: 107 mg/dL — ABNORMAL HIGH (ref 70–99)
Potassium: 3.7 mmol/L (ref 3.5–5.1)
Sodium: 131 mmol/L — ABNORMAL LOW (ref 135–145)
Total Bilirubin: 0.6 mg/dL (ref 0.3–1.2)
Total Protein: 8.4 g/dL — ABNORMAL HIGH (ref 6.5–8.1)

## 2022-01-29 MED ORDER — SODIUM CHLORIDE 0.9 % IV SOLN
Freq: Once | INTRAVENOUS | Status: AC
  Filled 2022-01-29: qty 250

## 2022-01-29 MED ORDER — HEPARIN SOD (PORK) LOCK FLUSH 100 UNIT/ML IV SOLN
500.0000 [IU] | Freq: Once | INTRAVENOUS | Status: AC | PRN
  Administered 2022-01-29: 500 [IU]
  Filled 2022-01-29: qty 5

## 2022-01-29 MED ORDER — SODIUM CHLORIDE 0.9 % IV SOLN
510.0000 mg | INTRAVENOUS | Status: DC
  Administered 2022-01-29: 510 mg via INTRAVENOUS
  Filled 2022-01-29: qty 510

## 2022-01-29 NOTE — Progress Notes (Signed)
Hematology/Oncology Consult note [facility]  Telephone:([phone] [phone]  Patient Care Team: Patient, No Pcp Per as PCP - General (General Practice) [name], RN as Oncology Nurse Navigator   Name of the patient: Jean Morris  [id number]  1975/07/23   Date of visit: 01/29/22  Diagnosis-stage IVb endometrioid endometrial cancer  Chief complaint/ Reason for visit- s/p cycle 1 of CarboTaxol chemotherapy with IDA due to blood loss from her endometrial cancer.   Heme/Onc history:  Patient is a 46 year old female who has seen [name] for irregular menstrual bleeding.  He has had an IUD in place and initially it was attribute it to use of IUD and was tried to be controlled with progesterone agents.  However due to worsening abdominal pain patient underwent a CT abdomen and pelvis with contrast on 12/25/2021 which showed 8 mm   Posterior lung base mass.  Retroperitoneal adenopathy measuring 2.6 cm additional retrocrural adenopathy.  Faint lucent lesion involving L2 vertebral body.  The uterus is anteverted and enlarged.  Endometrium is enlarged heterogeneous and irregular extending to the fundal myometrium.  Findings consistent with endometrial malignancy.  CT chest without contrast showed a 10 mm right lower lobe subpleural nodule.   Patient had endometrial biopsy which was consistent Fiegel grade 2 endometrioid endometrial carcinoma.  Patient was seen by GYN oncology and hopes to take her for surgery if she has good response to neoadjuvant chemotherapy.   Patient lives with her partner and has 2 adult children.  She reports significant flank pain as well as abdominal pain.  Ongoing vaginal bleeding.  She reports significant fatigue.  She has a prior history of pulmonary embolism in 2017 that was attributed to sequels for which she took anticoagulation for 1 year and stopped.  Interval history- Patient reports that she is feeling well other than  being fatigued. She has been working closely with OB-GYN to help control her uterine bleeding. On Eliquis and tolerating it well without any other bleeding. Per patient her uterine bleeding is slowing down well as expected. She denies any other blood loss. No SOB, chest pain, peripheral edema. She is scheduled for Feraheme today. She has tolerated this well in the past.   ECOG PS- 1 Pain scale- 2 Opioid associated constipation- no  Review of systems- Review of Systems  Constitutional:  Positive for malaise/fatigue and weight loss. Negative for chills and fever.  HENT:  Negative for nosebleeds.   Respiratory:  Negative for cough, hemoptysis, shortness of breath and wheezing.   Cardiovascular:  Negative for palpitations and leg swelling.  Gastrointestinal:  Negative for blood in stool and melena.  Genitourinary:  Negative for hematuria.       Vaginal bleeding  Skin:  Negative for rash.      Allergies  Allergen Reactions   Bee Pollen Nausea And Vomiting    Itchy, swelling, watery eyes, runny nose.   Pollen Extract Nausea And Vomiting    Itchy, swelling, watery eyes, runny nose.     Past Medical History:  Diagnosis Date   Anemia    Hypertension    Menorrhagia    Pulmonary embolism [facility])      Past Surgical History:  Procedure Laterality Date   CESAREAN SECTION  1994   COLONOSCOPY WITH PROPOFOL N/A 08/06/2021   Procedure: COLONOSCOPY WITH PROPOFOL;  Surgeon: Jonathon Bellows, MD;  [facility] ENDOSCOPY;  Service: Gastroenterology;  Laterality: N/A;   ESOPHAGOGASTRODUODENOSCOPY (EGD) WITH PROPOFOL N/A 02/09/2020   Procedure:  ESOPHAGOGASTRODUODENOSCOPY (EGD) WITH PROPOFOL;  Surgeon: Jonathon Bellows, MD;  Location: [facility] ENDOSCOPY;  Service: Gastroenterology;  Laterality: N/A;   IR IMAGING GUIDED PORT INSERTION  01/17/2022    Social History   Socioeconomic History   Marital status: Soil scientist    Spouse name: Not on file   Number of children: Not on file   Years of education:  Not on file   Highest education level: Not on file  Occupational History   Not on file  Tobacco Use   Smoking status: Never   Smokeless tobacco: Never  Vaping Use   Vaping Use: Never used  Substance and Sexual Activity   Alcohol use: No   Drug use: No   Sexual activity: Not Currently    Birth control/protection: I.U.D.    Comment: patient states MD said did not see in CT today 01/17/2022  Other Topics Concern   Not on file  Social History Narrative   Lives with [facility], partner, and no pets.   Social Determinants of Health   Financial Resource Strain: Not on file  Food Insecurity: Not on file  Transportation Needs: Not on file  Physical Activity: Not on file  Stress: Not on file  Social Connections: Not on file  Intimate Partner Violence: Not on file    Family History  Problem Relation Age of Onset   Hypertension Mother    Stroke Mother    Heart attack Mother    Cancer Father    Hypertension Father    Cancer Sister    Cancer Sister    Cancer Brother    Hypertension Other      Current Outpatient Medications:    apixaban (ELIQUIS) 2.5 MG TABS tablet, Take 1 tablet (2.5 mg total) by mouth 2 (two) times daily., Disp: 60 tablet, Rfl: 3   clotrimazole-betamethasone (LOTRISONE) cream, Apply 1 Application topically 2 (two) times daily., Disp: 60 g, Rfl: 0   dexamethasone (DECADRON) 4 MG tablet, Take 2 tablets by mouth starting the day after chemotherapy for 3 days. Take with food., Disp: 30 tablet, Rfl: 1   lidocaine-prilocaine (EMLA) cream, Apply to affected area once, Disp: 30 g, Rfl: 3   nystatin (MYCOSTATIN/NYSTOP) powder, Apply 1 Application topically 3 (three) times daily., Disp: 30 g, Rfl: 0   omeprazole (PRILOSEC) 40 MG capsule, Take 1 capsule (40 mg total) by mouth daily. For 6 weeks and then stop., Disp: 42 capsule, Rfl: 0   ondansetron (ZOFRAN) 8 MG tablet, Take 1 tablet (8 mg total) by mouth every 8 (eight) hours as needed for nausea or vomiting. Start on the  third day after chemotherapy., Disp: 30 tablet, Rfl: 1   oxyCODONE (OXY IR/ROXICODONE) 5 MG immediate release tablet, Take 1 tablet (5 mg total) by mouth every 4 (four) hours as needed for severe pain., Disp: 120 tablet, Rfl: 0   prochlorperazine (COMPAZINE) 10 MG tablet, Take 1 tablet (10 mg total) by mouth every 6 (six) hours as needed for nausea or vomiting., Disp: 30 tablet, Rfl: 1   ferrous sulfate 325 (65 FE) MG tablet, Take 1 tablet (325 mg total) by mouth 2 (two) times daily with a meal. (Patient taking differently: Take 325 mg by mouth 3 (three) times daily with meals.), Disp: 90 tablet, Rfl: 7   levonorgestrel (MIRENA) 20 MCG/24HR IUD, 1 Intra Uterine Device (1 each total) by Intrauterine route once. (Patient not taking: Reported on 01/15/2022), Disp: 1 each, Rfl: 0   polyethylene glycol-electrolytes (NULYTELY) 420 g solution, Drink one 8 oz  glass of mixture every 15 minutes until you finish all of the jug. (Patient not taking: Reported on 01/15/2022), Disp: 4000 mL, Rfl: 0 No current facility-administered medications for this visit.  Facility-Administered Medications Ordered in Other Visits:    ferumoxytol (FERAHEME) 510 mg in sodium chloride 0.9 % 100 mL IVPB, 510 mg, Intravenous, Weekly, Sindy Guadeloupe, MD, Last Rate: 468 mL/hr at 01/29/22 0909, 510 mg at 01/29/22 0909   heparin lock flush 100 unit/mL, 500 Units, Intracatheter, Once PRN, Sindy Guadeloupe, MD  Physical exam:  Vitals:   01/29/22 0844  BP: (!) 121/93  Temp: 98.6 F (37 C)  TempSrc: Tympanic  Weight: 244 lb 12.8 oz (111 kg)  Height: '5\' 5"'$  (1.651 m)   Physical Exam Constitutional:      General: She is not in acute distress.    Appearance: She is obese.  HENT:     Head: Normocephalic and atraumatic.  Eyes:     General: No scleral icterus. Cardiovascular:     Rate and Rhythm: Normal rate and regular rhythm.     Heart sounds: Normal heart sounds.  Pulmonary:     Effort: Pulmonary effort is normal.     Breath  sounds: Normal breath sounds.  Musculoskeletal:     Cervical back: Neck supple.  Lymphadenopathy:     Cervical: No cervical adenopathy.  Skin:    General: Skin is warm and dry.  Neurological:     Mental Status: She is alert and oriented to person, place, and time.         Latest Ref Rng & Units 01/29/2022    8:26 AM  CMP  Glucose 70 - 99 mg/dL 107   BUN 6 - 20 mg/dL 12   Creatinine 0.44 - 1.00 mg/dL 0.80   Sodium 135 - 145 mmol/L 131   Potassium 3.5 - 5.1 mmol/L 3.7   Chloride 98 - 111 mmol/L 98   CO2 22 - 32 mmol/L 22   Calcium 8.9 - 10.3 mg/dL 8.9   Total Protein 6.5 - 8.1 g/dL 8.4   Total Bilirubin 0.3 - 1.2 mg/dL 0.6   Alkaline Phos 38 - 126 U/L 49   AST 15 - 41 U/L 25   ALT 0 - 44 U/L 17       Latest Ref Rng & Units 01/29/2022    8:26 AM  CBC  WBC 4.0 - 10.5 K/uL 1.5   Hemoglobin 12.0 - 15.0 g/dL 7.9   Hematocrit 36.0 - 46.0 % 23.5   Platelets 150 - 400 K/uL 257     No images are attached to the encounter.  IR IMAGING GUIDED PORT INSERTION  Result Date: 01/17/2022 CLINICAL DATA:  ENDOMETRIAL CARCINOMA, ACCESS FOR CHEMOTHERAPY EXAM: RIGHT INTERNAL JUGULAR SINGLE LUMEN POWER PORT CATHETER INSERTION Date:  01/17/2022 01/17/2022 11:53 am Radiologist:  M. Daryll Brod, MD Guidance:  Ultrasound and fluoroscopic MEDICATIONS: 1% lidocaine local with epinephrine ANESTHESIA/SEDATION: Versed 2.0 mg IV; Fentanyl 100 mcg IV; Moderate Sedation Time:  25 minute The patient was continuously monitored during the procedure by the interventional radiology nurse under my direct supervision. FLUOROSCOPY: One minutes, 24 seconds (24 mGy) COMPLICATIONS: None immediate. CONTRAST:  None. PROCEDURE: Informed consent was obtained from the patient following explanation of the procedure, risks, benefits and alternatives. The patient understands, agrees and consents for the procedure. All questions were addressed. A time out was performed. Maximal barrier sterile technique utilized including caps,  mask, sterile gowns, sterile gloves, large sterile drape, hand hygiene, and 2%  chlorhexidine scrub. Under sterile conditions and local anesthesia, right internal jugular micropuncture venous access was performed. Access was performed with ultrasound. Images were obtained for documentation of the patent right internal jugular vein. A guide wire was inserted followed by a transitional dilator. This allowed insertion of a guide wire and catheter into the IVC. Measurements were obtained from the SVC / RA junction back to the right IJ venotomy site. In the right infraclavicular chest, a subcutaneous pocket was created over the second anterior rib. This was done under sterile conditions and local anesthesia. 1% lidocaine with epinephrine was utilized for this. A 2.5 cm incision was made in the skin. Blunt dissection was performed to create a subcutaneous pocket over the right pectoralis major muscle. The pocket was flushed with saline vigorously. There was adequate hemostasis. The port catheter was assembled and checked for leakage. The port catheter was secured in the pocket with two retention sutures. The tubing was tunneled subcutaneously to the right venotomy site and inserted into the SVC/RA junction through a valved peel-away sheath. Position was confirmed with fluoroscopy. Images were obtained for documentation. The patient tolerated the procedure well. No immediate complications. Incisions were closed in a two layer fashion with 4 - 0 Vicryl suture. Dermabond was applied to the skin. The port catheter was accessed, blood was aspirated followed by saline and heparin flushes. Needle was removed. A dry sterile dressing was applied. IMPRESSION: Ultrasound and fluoroscopically guided right internal jugular single lumen power port catheter insertion. Tip in the SVC/RA junction. Catheter ready for use. Electronically Signed   By: Jerilynn Mages.  Shick M.D.   On: 01/17/2022 12:02   US PELVIS (TRANSABDOMINAL ONLY)  Result Date:  01/12/2022 Patient Name: Norman L Ackers DOB: November 07, 1975 MRN: [id number] LMP: unknown ULTRASOUND REPORT Location: [facility] Date of Service: 01/06/2022 Indications: f/u u/s and CT scan, thickened endometrium, AUB Findings: The uterus is enlarged, anteverted and measures 19.4 x 14.1 x 11.4 cm. Echo texture is heterogenous without evidence of focal masses. The Endometrium measures 82.5 mm - irregular margins - hypervascular Right Ovary is not visualized Left Ovary is not visualized Survey of the adnexa demonstrates no adnexal masses. There is no free fluid in the cul de sac. Impression: 1. Enlarged uterus 2. Thick, hypervascular, irregular endometrium 3. Ovaries are not visualized Recommendations: 1.Clinical correlation with the patient's History and Physical Exam. Edwena Bunde, RDMS, RVT I have reviewed this study and agree with documented findings. Rubie Maid, MD [facility] [place]  US PELVIS TRANSVAGINAL NON-OB (TV ONLY)  Result Date: 01/12/2022 Patient Name: Eleshia L Gunnerson DOB: 1975/08/20 MRN: [id number] LMP: unknown ULTRASOUND REPORT Location: [facility] Date of Service: 01/06/2022 Indications: f/u u/s and CT scan, thickened endometrium, AUB Findings: The uterus is enlarged, anteverted and measures 19.4 x 14.1 x 11.4 cm. Echo texture is heterogenous without evidence of focal masses. The Endometrium measures 82.5 mm - irregular margins - hypervascular Right Ovary is not visualized Left Ovary is not visualized Survey of the adnexa demonstrates no adnexal masses. There is no free fluid in the cul de sac. Impression: 1. Enlarged uterus 2. Thick, hypervascular, irregular endometrium 3. Ovaries are not visualized Recommendations: 1.Clinical correlation with the patient's History and Physical Exam. Edwena Bunde, RDMS, RVT I have reviewed this study and agree with documented findings. Rubie Maid, MD [facility] [place] and plan-  Patient is a 46 y.o. female with stage IV endometrioid endometrial carcinoma T2 N2 M1 here for on  treatment assessment prior to Feraheme 2/2  Iron deficiency anemia: She will receive her second dose of IV Feraheme today which she has tolerated well in the past. Due to her drop in hgb I am having her be seen tomorrow and plan for blood on Friday. Luckily bleeding is slowing down well with OB-GYN's efforts. She will continue close follow up with OB-GYN. Continue Eliquis as this does not seem to be effecting her bleeding but does provider protection against thromboembolic events.   Neoplasm related pain: Continue as needed oxycodone. No change in plan today  Will update her Oncologist Dr. Janese Banks.    Visit Diagnosis 1. Symptomatic anemia   2. Endometrial cancer ([place])   3. Other iron deficiency anemia    Disposition: Feraheme today RTC Thursday APP, port labs (CBC w/, hold tube)  RTC Friday Blood transfusion RTC 2 weeks APP, port labs (CBC w/, CMP, hold tube)-[place]    Minna Antis [facility] [phone] 01/29/2022 9:19 AM

## 2022-01-29 NOTE — Telephone Encounter (Signed)
Received via fax from [place] a form for Short Term Disability. Form completed records copied and sent for physician signature

## 2022-01-30 ENCOUNTER — Other Ambulatory Visit: Payer: Self-pay

## 2022-01-30 ENCOUNTER — Inpatient Hospital Stay (HOSPITAL_BASED_OUTPATIENT_CLINIC_OR_DEPARTMENT_OTHER): Payer: BC Managed Care – PPO | Admitting: Nurse Practitioner

## 2022-01-30 ENCOUNTER — Encounter: Payer: Self-pay | Admitting: Nurse Practitioner

## 2022-01-30 ENCOUNTER — Inpatient Hospital Stay: Payer: BC Managed Care – PPO

## 2022-01-30 ENCOUNTER — Ambulatory Visit: Payer: BC Managed Care – PPO | Admitting: Obstetrics and Gynecology

## 2022-01-30 VITALS — BP 102/67 | HR 124 | Temp 99.9°F | Ht 65.0 in | Wt 244.0 lb

## 2022-01-30 DIAGNOSIS — T451X5A Adverse effect of antineoplastic and immunosuppressive drugs, initial encounter: Secondary | ICD-10-CM | POA: Diagnosis not present

## 2022-01-30 DIAGNOSIS — D500 Iron deficiency anemia secondary to blood loss (chronic): Secondary | ICD-10-CM | POA: Diagnosis not present

## 2022-01-30 DIAGNOSIS — D649 Anemia, unspecified: Secondary | ICD-10-CM

## 2022-01-30 DIAGNOSIS — C541 Malignant neoplasm of endometrium: Secondary | ICD-10-CM

## 2022-01-30 DIAGNOSIS — D701 Agranulocytosis secondary to cancer chemotherapy: Secondary | ICD-10-CM | POA: Diagnosis not present

## 2022-01-30 DIAGNOSIS — R918 Other nonspecific abnormal finding of lung field: Secondary | ICD-10-CM | POA: Diagnosis not present

## 2022-01-30 DIAGNOSIS — Z86711 Personal history of pulmonary embolism: Secondary | ICD-10-CM | POA: Diagnosis not present

## 2022-01-30 DIAGNOSIS — G893 Neoplasm related pain (acute) (chronic): Secondary | ICD-10-CM

## 2022-01-30 DIAGNOSIS — Z5111 Encounter for antineoplastic chemotherapy: Secondary | ICD-10-CM | POA: Diagnosis not present

## 2022-01-30 DIAGNOSIS — I10 Essential (primary) hypertension: Secondary | ICD-10-CM | POA: Diagnosis not present

## 2022-01-30 DIAGNOSIS — R590 Localized enlarged lymph nodes: Secondary | ICD-10-CM | POA: Diagnosis not present

## 2022-01-30 DIAGNOSIS — R5383 Other fatigue: Secondary | ICD-10-CM | POA: Diagnosis not present

## 2022-01-30 DIAGNOSIS — Z7901 Long term (current) use of anticoagulants: Secondary | ICD-10-CM | POA: Diagnosis not present

## 2022-01-30 DIAGNOSIS — Z79899 Other long term (current) drug therapy: Secondary | ICD-10-CM | POA: Diagnosis not present

## 2022-01-30 DIAGNOSIS — D509 Iron deficiency anemia, unspecified: Secondary | ICD-10-CM | POA: Diagnosis not present

## 2022-01-30 LAB — BASIC METABOLIC PANEL
Anion gap: 11 (ref 5–15)
BUN: 9 mg/dL (ref 6–20)
CO2: 23 mmol/L (ref 22–32)
Calcium: 9 mg/dL (ref 8.9–10.3)
Chloride: 100 mmol/L (ref 98–111)
Creatinine, Ser: 0.91 mg/dL (ref 0.44–1.00)
GFR, Estimated: >60 mL/min (ref >60)
Glucose, Bld: 119 mg/dL — ABNORMAL HIGH (ref 70–99)
Potassium: 4.1 mmol/L (ref 3.5–5.1)
Sodium: 134 mmol/L — ABNORMAL LOW (ref 135–145)

## 2022-01-30 LAB — CBC WITH DIFFERENTIAL/PLATELET
Abs Immature Granulocytes: 0.01 10*3/uL (ref 0.00–0.07)
Basophils Absolute: 0 10*3/uL (ref 0.0–0.1)
Basophils Relative: 1 %
Eosinophils Absolute: 0.1 10*3/uL (ref 0.0–0.5)
Eosinophils Relative: 8 %
HCT: 23.6 % — ABNORMAL LOW (ref 36.0–46.0)
Hemoglobin: 8 g/dL — ABNORMAL LOW (ref 12.0–15.0)
Immature Granulocytes: 1 %
Lymphocytes Relative: 49 %
Lymphs Abs: 0.8 10*3/uL (ref 0.7–4.0)
MCH: 21 pg — ABNORMAL LOW (ref 26.0–34.0)
MCHC: 33.9 g/dL (ref 30.0–36.0)
MCV: 61.9 fL — ABNORMAL LOW (ref 80.0–100.0)
Monocytes Absolute: 0.3 10*3/uL (ref 0.1–1.0)
Monocytes Relative: 18 %
Neutro Abs: 0.4 10*3/uL — CL (ref 1.7–7.7)
Neutrophils Relative %: 23 %
Platelets: 255 10*3/uL (ref 150–400)
RBC: 3.81 MIL/uL — ABNORMAL LOW (ref 3.87–5.11)
RDW: 19.6 % — ABNORMAL HIGH (ref 11.5–15.5)
Smear Review: NORMAL
WBC: 1.5 10*3/uL — ABNORMAL LOW (ref 4.0–10.5)
nRBC: 2.6 % — ABNORMAL HIGH (ref 0.0–0.2)

## 2022-01-30 LAB — PREPARE RBC (CROSSMATCH)

## 2022-01-30 NOTE — Progress Notes (Signed)
Hematology/Oncology Consult Note [facility]  [phone] [phone]  Patient Care Team: Patient, No Pcp Per as PCP - General (General Practice) [name], RN as Oncology Nurse Navigator   Name of the patient: Jean Morris  [id number]  12-Mar-1975   Date of visit: 01/30/22  Diagnosis-stage IVb endometrioid endometrial cancer  Chief complaint/ Reason for visit- anemia  Heme/Onc history:  Patient is a 46 year old female who has seen [name] for irregular menstrual bleeding.  He has had an IUD in place and initially it was attribute it to use of IUD and was tried to be controlled with progesterone agents.  However due to worsening abdominal pain patient underwent a CT abdomen and pelvis with contrast on 12/25/2021 which showed 8 mm   Posterior lung base mass.  Retroperitoneal adenopathy measuring 2.6 cm additional retrocrural adenopathy.  Faint lucent lesion involving L2 vertebral body.  The uterus is anteverted and enlarged.  Endometrium is enlarged heterogeneous and irregular extending to the fundal myometrium.  Findings consistent with endometrial malignancy.  CT chest without contrast showed a 10 mm right lower lobe subpleural nodule.   Patient had endometrial biopsy which was consistent Fiegel grade 2 endometrioid endometrial carcinoma.  Patient was seen by GYN oncology and hopes to take her for surgery if she has good response to neoadjuvant chemotherapy.   Patient lives with her partner and has 2 adult children.  She reports significant flank pain as well as abdominal pain.  Ongoing vaginal bleeding.  She reports significant fatigue.  She has a prior history of pulmonary embolism in 2017 that was attributed to sequels for which she took anticoagulation for 1 year and stopped.  Interval history- Patient is 46 year old female diagnosed with stage IV endometrioid endometrial carcinoma, s/p cycle 1 of neoadjuvant carbo-taxol chemotherapy on  01/21/22. She's had ongoing vaginal bleeding secondary to malignancy. Presents to clinic for evaluation and management of anemia. She received one dose of iv feraheme yesterday. Tolerated well. She continues eliquis. Reports ongoing fatigue and general malaise. Continues to have vaginal bleeding but has improved since starting chemo. 2 pads per day.   ECOG PS- 1 Pain scale- 1 Opioid associated constipation- no  Review of systems- Review of Systems  Constitutional:  Positive for malaise/fatigue. Negative for chills, fever and weight loss.  HENT:  Negative for hearing loss, nosebleeds, sore throat and tinnitus.   Eyes:  Negative for blurred vision and double vision.  Respiratory:  Negative for cough, hemoptysis, shortness of breath and wheezing.   Cardiovascular:  Negative for chest pain, palpitations and leg swelling.  Gastrointestinal:  Negative for abdominal pain, blood in stool, constipation, diarrhea, melena, nausea and vomiting.  Genitourinary:  Negative for dysuria and urgency.  Musculoskeletal:  Negative for back pain, falls, joint pain and myalgias.  Skin:  Negative for itching and rash.  Neurological:  Negative for dizziness, tingling, sensory change, loss of consciousness, weakness and headaches.  Endo/Heme/Allergies:  Negative for environmental allergies. Bruises/bleeds easily.  Psychiatric/Behavioral:  Negative for depression. The patient is not nervous/anxious and does not have insomnia.       Allergies  Allergen Reactions   Bee Pollen Nausea And Vomiting    Itchy, swelling, watery eyes, runny nose.   Pollen Extract Nausea And Vomiting    Itchy, swelling, watery eyes, runny nose.    Past Medical History:  Diagnosis Date   Anemia    Hypertension    Menorrhagia    Pulmonary embolism ([place])  Past Surgical History:  Procedure Laterality Date   CESAREAN SECTION  1994   COLONOSCOPY WITH PROPOFOL N/A 08/06/2021   Procedure: COLONOSCOPY WITH PROPOFOL;  Surgeon: Jonathon Bellows, MD;  Location: [facility] ENDOSCOPY;  Service: Gastroenterology;  Laterality: N/A;   ESOPHAGOGASTRODUODENOSCOPY (EGD) WITH PROPOFOL N/A 02/09/2020   Procedure: ESOPHAGOGASTRODUODENOSCOPY (EGD) WITH PROPOFOL;  Surgeon: Jonathon Bellows, MD;  Location: [facility] ENDOSCOPY;  Service: Gastroenterology;  Laterality: N/A;   IR IMAGING GUIDED PORT INSERTION  01/17/2022    Social History   Socioeconomic History   Marital status: Soil scientist    Spouse name: Not on file   Number of children: Not on file   Years of education: Not on file   Highest education level: Not on file  Occupational History   Not on file  Tobacco Use   Smoking status: Never   Smokeless tobacco: Never  Vaping Use   Vaping Use: Never used  Substance and Sexual Activity   Alcohol use: No   Drug use: No   Sexual activity: Not Currently    Birth control/protection: I.U.D.    Comment: patient states MD said did not see in CT today 01/17/2022  Other Topics Concern   Not on file  Social History Narrative   Lives with [facility], partner, and no pets.   Social Determinants of Health   Financial Resource Strain: Not on file  Food Insecurity: Not on file  Transportation Needs: Not on file  Physical Activity: Not on file  Stress: Not on file  Social Connections: Not on file  Intimate Partner Violence: Not on file    Family History  Problem Relation Age of Onset   Hypertension Mother    Stroke Mother    Heart attack Mother    Cancer Father    Hypertension Father    Cancer Sister    Cancer Sister    Cancer Brother    Hypertension Other      Current Outpatient Medications:    apixaban (ELIQUIS) 2.5 MG TABS tablet, Take 1 tablet (2.5 mg total) by mouth 2 (two) times daily., Disp: 60 tablet, Rfl: 3   clotrimazole-betamethasone (LOTRISONE) cream, Apply 1 Application topically 2 (two) times daily., Disp: 60 g, Rfl: 0   dexamethasone (DECADRON) 4 MG tablet, Take 2 tablets by mouth starting the day after chemotherapy for 3  days. Take with food., Disp: 30 tablet, Rfl: 1   ferrous sulfate 325 (65 FE) MG tablet, Take 1 tablet (325 mg total) by mouth 2 (two) times daily with a meal. (Patient taking differently: Take 325 mg by mouth 3 (three) times daily with meals.), Disp: 90 tablet, Rfl: 7   levonorgestrel (MIRENA) 20 MCG/24HR IUD, 1 Intra Uterine Device (1 each total) by Intrauterine route once. (Patient not taking: Reported on 01/15/2022), Disp: 1 each, Rfl: 0   lidocaine-prilocaine (EMLA) cream, Apply to affected area once, Disp: 30 g, Rfl: 3   nystatin (MYCOSTATIN/NYSTOP) powder, Apply 1 Application topically 3 (three) times daily., Disp: 30 g, Rfl: 0   omeprazole (PRILOSEC) 40 MG capsule, Take 1 capsule (40 mg total) by mouth daily. For 6 weeks and then stop., Disp: 42 capsule, Rfl: 0   ondansetron (ZOFRAN) 8 MG tablet, Take 1 tablet (8 mg total) by mouth every 8 (eight) hours as needed for nausea or vomiting. Start on the third day after chemotherapy., Disp: 30 tablet, Rfl: 1   oxyCODONE (OXY IR/ROXICODONE) 5 MG immediate release tablet, Take 1 tablet (5 mg total) by mouth every 4 (four) hours  as needed for severe pain., Disp: 120 tablet, Rfl: 0   polyethylene glycol-electrolytes (NULYTELY) 420 g solution, Drink one 8 oz glass of mixture every 15 minutes until you finish all of the jug. (Patient not taking: Reported on 01/15/2022), Disp: 4000 mL, Rfl: 0   prochlorperazine (COMPAZINE) 10 MG tablet, Take 1 tablet (10 mg total) by mouth every 6 (six) hours as needed for nausea or vomiting., Disp: 30 tablet, Rfl: 1  Physical exam:  Vitals:   01/30/22 0937  BP: 102/67  Pulse: (!) 124  Temp: 99.9 F (37.7 C)  TempSrc: Tympanic  Weight: 244 lb (110.7 kg)  Height: _0  (1.651 m)    Physical Exam Constitutional:      General: She is not in acute distress.    Appearance: She is obese.  HENT:     Head: Normocephalic and atraumatic.  Eyes:     General: No scleral icterus. Cardiovascular:     Rate and Rhythm:  Normal rate and regular rhythm.     Heart sounds: Normal heart sounds.  Pulmonary:     Effort: Pulmonary effort is normal.     Breath sounds: Normal breath sounds.  Musculoskeletal:     Cervical back: Neck supple.  Lymphadenopathy:     Cervical: No cervical adenopathy.  Skin:    General: Skin is warm and dry.  Neurological:     Mental Status: She is alert and oriented to person, place, and time.        Latest Ref Rng & Units 01/29/2022    8:26 AM  CMP  Glucose 70 - 99 mg/dL 107   BUN 6 - 20 mg/dL 12   Creatinine 0.44 - 1.00 mg/dL 0.80   Sodium 135 - 145 mmol/L 131   Potassium 3.5 - 5.1 mmol/L 3.7   Chloride 98 - 111 mmol/L 98   CO2 22 - 32 mmol/L 22   Calcium 8.9 - 10.3 mg/dL 8.9   Total Protein 6.5 - 8.1 g/dL 8.4   Total Bilirubin 0.3 - 1.2 mg/dL 0.6   Alkaline Phos 38 - 126 U/L 49   AST 15 - 41 U/L 25   ALT 0 - 44 U/L 17       Latest Ref Rng & Units 01/29/2022    8:26 AM  CBC  WBC 4.0 - 10.5 K/uL 1.5   Hemoglobin 12.0 - 15.0 g/dL 7.9   Hematocrit 36.0 - 46.0 % 23.5   Platelets 150 - 400 K/uL 257    Iron/TIBC/Ferritin/ %Sat    Component Value Date/Time   IRON 14 (L) 01/15/2022 1035   IRON 25 (L) 11/19/2016 1053   IRON 14 (L) 08/22/2012 2206   TIBC 252 01/15/2022 1035   TIBC 247 (L) 11/19/2016 1053   TIBC 341 08/22/2012 2206   FERRITIN 60 01/15/2022 1035   FERRITIN 5 (L) 08/22/2012 2206   IRONPCTSAT 6 (L) 01/15/2022 1035   IRONPCTSAT 10 (L) 11/19/2016 1053   IRONPCTSAT 4 08/22/2012 2206    No images are attached to the encounter.  IR IMAGING GUIDED PORT INSERTION  Result Date: 01/17/2022 CLINICAL DATA:  ENDOMETRIAL CARCINOMA, ACCESS FOR CHEMOTHERAPY EXAM: RIGHT INTERNAL JUGULAR SINGLE LUMEN POWER PORT CATHETER INSERTION Date:  01/17/2022 01/17/2022 11:53 am Radiologist:  M. Daryll Brod, MD Guidance:  Ultrasound and fluoroscopic MEDICATIONS: 1% lidocaine local with epinephrine ANESTHESIA/SEDATION: Versed 2.0 mg IV; Fentanyl 100 mcg IV; Moderate Sedation  Time:  25 minute The patient was continuously monitored during the procedure by the interventional radiology nurse  under my direct supervision. FLUOROSCOPY: One minutes, 24 seconds (24 mGy) COMPLICATIONS: None immediate. CONTRAST:  None. PROCEDURE: Informed consent was obtained from the patient following explanation of the procedure, risks, benefits and alternatives. The patient understands, agrees and consents for the procedure. All questions were addressed. A time out was performed. Maximal barrier sterile technique utilized including caps, mask, sterile gowns, sterile gloves, large sterile drape, hand hygiene, and 2% chlorhexidine scrub. Under sterile conditions and local anesthesia, right internal jugular micropuncture venous access was performed. Access was performed with ultrasound. Images were obtained for documentation of the patent right internal jugular vein. A guide wire was inserted followed by a transitional dilator. This allowed insertion of a guide wire and catheter into the IVC. Measurements were obtained from the SVC / RA junction back to the right IJ venotomy site. In the right infraclavicular chest, a subcutaneous pocket was created over the second anterior rib. This was done under sterile conditions and local anesthesia. 1% lidocaine with epinephrine was utilized for this. A 2.5 cm incision was made in the skin. Blunt dissection was performed to create a subcutaneous pocket over the right pectoralis major muscle. The pocket was flushed with saline vigorously. There was adequate hemostasis. The port catheter was assembled and checked for leakage. The port catheter was secured in the pocket with two retention sutures. The tubing was tunneled subcutaneously to the right venotomy site and inserted into the SVC/RA junction through a valved peel-away sheath. Position was confirmed with fluoroscopy. Images were obtained for documentation. The patient tolerated the procedure well. No immediate  complications. Incisions were closed in a two layer fashion with 4 - 0 Vicryl suture. Dermabond was applied to the skin. The port catheter was accessed, blood was aspirated followed by saline and heparin flushes. Needle was removed. A dry sterile dressing was applied. IMPRESSION: Ultrasound and fluoroscopically guided right internal jugular single lumen power port catheter insertion. Tip in the SVC/RA junction. Catheter ready for use. Electronically Signed   By: Jerilynn Mages.  Shick M.D.   On: 01/17/2022 12:02   US PELVIS (TRANSABDOMINAL ONLY)  Result Date: 01/12/2022 Patient Name: Tameyah L Wojtaszek DOB: Sep 20, 1975 MRN: [id number] LMP: unknown ULTRASOUND REPORT Location: [facility] Date of Service: 01/06/2022 Indications: f/u u/s and CT scan, thickened endometrium, AUB Findings: The uterus is enlarged, anteverted and measures 19.4 x 14.1 x 11.4 cm. Echo texture is heterogenous without evidence of focal masses. The Endometrium measures 82.5 mm - irregular margins - hypervascular Right Ovary is not visualized Left Ovary is not visualized Survey of the adnexa demonstrates no adnexal masses. There is no free fluid in the cul de sac. Impression: 1. Enlarged uterus 2. Thick, hypervascular, irregular endometrium 3. Ovaries are not visualized Recommendations: 1.Clinical correlation with the patient's History and Physical Exam. Edwena Bunde, RDMS, RVT I have reviewed this study and agree with documented findings. Rubie Maid, MD [facility] [place]  US PELVIS TRANSVAGINAL NON-OB (TV ONLY)  Result Date: 01/12/2022 Patient Name: Ambrie L Comer DOB: [birth year] MRN: [id number] LMP: unknown ULTRASOUND REPORT Location: [facility] Date of Service: 01/06/2022 Indications: f/u u/s and CT scan, thickened endometrium, AUB Findings: The uterus is enlarged, anteverted and measures 19.4 x 14.1 x 11.4 cm. Echo texture is heterogenous without evidence of focal masses. The Endometrium measures  82.5 mm - irregular margins - hypervascular Right Ovary is not visualized Left Ovary is not visualized Survey of the adnexa demonstrates no adnexal masses. There is no free fluid in the cul  de sac. Impression: 1. Enlarged uterus 2. Thick, hypervascular, irregular endometrium 3. Ovaries are not visualized Recommendations: 1.Clinical correlation with the patient's History and Physical Exam. Edwena Bunde, RDMS, RVT I have reviewed this study and agree with documented findings. Rubie Maid, MD [facility] and plan- Patient is a 46 y.o. female with  Iron deficiency anemia- secondary to chronic blood loss d/t malignancy. Ferritin 60, iron sat 6%. S/p ferhaheme x 2 on 01/29/22. Monitor Symptomatic anemia- secondary to chronic blood loss, chronic disease, and chemotherapy. Plan to optimize counts with transfusions and iron infusions when appropriate.  Vaginal bleeding- secondary to malignancy. Reviewed that we would hope that bleeding decreases with chemotherapy. Hold palliative radiation for now given possibility for debulking surgery in future.  High risk of VTE- elevated khorana score. Reviewed rationale for eliquis. Encouraged compliance.  Neoplasm related pain- Pain has improved with starting chemotherapy. Continue oxycodone as prescribed.  Stage IV endometrioid endometrial carcinoma- currently s/p cycle 1 of carboplatin-paclitaxel chemotherapy. Plans to add pembrolizumab for cycle 2.  Neutropenia- wbc 1.5 today. ANC 0.4. Reviewed neutropenic precautions. If persistently low counts at next chemo may consider adding gcsf.  Molecular testing- BRAF negative and MSI-H. Results available after visit therefore I have not discussed with patient. More likely sporadic malignancy as opposed to hereditary. Plan for adding pembrolizumab with next cycle.   Disposition:  1 unit pRBCs tomorrow Follow up with Dr Janese Banks as scheduled- la  Visit Diagnosis 1. Symptomatic anemia   2.  Iron deficiency anemia due to chronic blood loss   3. Neoplasm related pain   4. Chemotherapy induced neutropenia ([place])    Beckey Rutter, [place], AGNP-C [place] at [facility] [phone] (clinic) 01/30/2022

## 2022-01-31 ENCOUNTER — Inpatient Hospital Stay: Payer: BC Managed Care – PPO

## 2022-01-31 DIAGNOSIS — R590 Localized enlarged lymph nodes: Secondary | ICD-10-CM | POA: Diagnosis not present

## 2022-01-31 DIAGNOSIS — D649 Anemia, unspecified: Secondary | ICD-10-CM

## 2022-01-31 DIAGNOSIS — G893 Neoplasm related pain (acute) (chronic): Secondary | ICD-10-CM | POA: Diagnosis not present

## 2022-01-31 DIAGNOSIS — Z7901 Long term (current) use of anticoagulants: Secondary | ICD-10-CM | POA: Diagnosis not present

## 2022-01-31 DIAGNOSIS — R5383 Other fatigue: Secondary | ICD-10-CM | POA: Diagnosis not present

## 2022-01-31 DIAGNOSIS — C541 Malignant neoplasm of endometrium: Secondary | ICD-10-CM | POA: Diagnosis not present

## 2022-01-31 DIAGNOSIS — T451X5A Adverse effect of antineoplastic and immunosuppressive drugs, initial encounter: Secondary | ICD-10-CM | POA: Diagnosis not present

## 2022-01-31 DIAGNOSIS — Z79899 Other long term (current) drug therapy: Secondary | ICD-10-CM | POA: Diagnosis not present

## 2022-01-31 DIAGNOSIS — Z5111 Encounter for antineoplastic chemotherapy: Secondary | ICD-10-CM | POA: Diagnosis not present

## 2022-01-31 DIAGNOSIS — Z86711 Personal history of pulmonary embolism: Secondary | ICD-10-CM | POA: Diagnosis not present

## 2022-01-31 DIAGNOSIS — D509 Iron deficiency anemia, unspecified: Secondary | ICD-10-CM | POA: Diagnosis not present

## 2022-01-31 DIAGNOSIS — R918 Other nonspecific abnormal finding of lung field: Secondary | ICD-10-CM | POA: Diagnosis not present

## 2022-01-31 DIAGNOSIS — I10 Essential (primary) hypertension: Secondary | ICD-10-CM | POA: Diagnosis not present

## 2022-01-31 MED ORDER — DIPHENHYDRAMINE HCL 25 MG PO CAPS
25.0000 mg | ORAL_CAPSULE | Freq: Once | ORAL | Status: AC
  Administered 2022-01-31: 25 mg via ORAL
  Filled 2022-01-31: qty 1

## 2022-01-31 MED ORDER — SODIUM CHLORIDE 0.9% IV SOLUTION
250.0000 mL | Freq: Once | INTRAVENOUS | Status: AC
  Administered 2022-01-31: 250 mL via INTRAVENOUS
  Filled 2022-01-31: qty 250

## 2022-01-31 MED ORDER — HEPARIN SOD (PORK) LOCK FLUSH 100 UNIT/ML IV SOLN
500.0000 [IU] | Freq: Every day | INTRAVENOUS | Status: AC | PRN
  Administered 2022-01-31: 500 [IU]
  Filled 2022-01-31: qty 5

## 2022-01-31 MED ORDER — ACETAMINOPHEN 325 MG PO TABS
650.0000 mg | ORAL_TABLET | Freq: Once | ORAL | Status: AC
  Administered 2022-01-31: 650 mg via ORAL
  Filled 2022-01-31: qty 2

## 2022-02-01 LAB — BPAM RBC
Blood Product Expiration Date: 202401072359
ISSUE DATE / TIME: 202312080941
Unit Type and Rh: 7300

## 2022-02-01 LAB — TYPE AND SCREEN
ABO/RH(D): B POS
Antibody Screen: NEGATIVE
Unit division: 0

## 2022-02-03 ENCOUNTER — Encounter: Payer: Self-pay | Admitting: Oncology

## 2022-02-03 NOTE — Telephone Encounter (Signed)
Form signed and faxed Message sent to patient to let her know. Copy sent to scan into her chart

## 2022-02-04 ENCOUNTER — Other Ambulatory Visit: Payer: BC Managed Care – PPO

## 2022-02-04 ENCOUNTER — Ambulatory Visit: Payer: BC Managed Care – PPO

## 2022-02-04 ENCOUNTER — Ambulatory Visit: Payer: BC Managed Care – PPO | Admitting: Hospice and Palliative Medicine

## 2022-02-11 ENCOUNTER — Other Ambulatory Visit: Payer: Self-pay

## 2022-02-11 ENCOUNTER — Emergency Department: Payer: BC Managed Care – PPO

## 2022-02-11 ENCOUNTER — Encounter: Payer: Self-pay | Admitting: *Deleted

## 2022-02-11 ENCOUNTER — Emergency Department
Admission: EM | Admit: 2022-02-11 | Discharge: 2022-02-12 | Disposition: A | Discharge status: Home / Self Care | Payer: BC Managed Care – PPO | Attending: Emergency Medicine | Admitting: Emergency Medicine

## 2022-02-11 DIAGNOSIS — C539 Malignant neoplasm of cervix uteri, unspecified: Secondary | ICD-10-CM | POA: Insufficient documentation

## 2022-02-11 DIAGNOSIS — K219 Gastro-esophageal reflux disease without esophagitis: Secondary | ICD-10-CM | POA: Insufficient documentation

## 2022-02-11 DIAGNOSIS — Z7901 Long term (current) use of anticoagulants: Secondary | ICD-10-CM | POA: Diagnosis not present

## 2022-02-11 DIAGNOSIS — R142 Eructation: Secondary | ICD-10-CM | POA: Diagnosis not present

## 2022-02-11 DIAGNOSIS — R0602 Shortness of breath: Secondary | ICD-10-CM | POA: Diagnosis not present

## 2022-02-11 DIAGNOSIS — I10 Essential (primary) hypertension: Secondary | ICD-10-CM | POA: Diagnosis not present

## 2022-02-11 DIAGNOSIS — R0789 Other chest pain: Secondary | ICD-10-CM | POA: Diagnosis not present

## 2022-02-11 LAB — BASIC METABOLIC PANEL
Anion gap: 9 (ref 5–15)
BUN: 10 mg/dL (ref 6–20)
CO2: 26 mmol/L (ref 22–32)
Calcium: 9.5 mg/dL (ref 8.9–10.3)
Chloride: 106 mmol/L (ref 98–111)
Creatinine, Ser: 0.94 mg/dL (ref 0.44–1.00)
GFR, Estimated: >60 mL/min (ref >60)
Glucose, Bld: 121 mg/dL — ABNORMAL HIGH (ref 70–99)
Potassium: 3.2 mmol/L — ABNORMAL LOW (ref 3.5–5.1)
Sodium: 141 mmol/L (ref 135–145)

## 2022-02-11 LAB — CBC
HCT: 31.1 % — ABNORMAL LOW (ref 36.0–46.0)
Hemoglobin: 10.2 g/dL — ABNORMAL LOW (ref 12.0–15.0)
MCH: 22.4 pg — ABNORMAL LOW (ref 26.0–34.0)
MCHC: 32.8 g/dL (ref 30.0–36.0)
MCV: 68.2 fL — ABNORMAL LOW (ref 80.0–100.0)
Platelets: 424 10*3/uL — ABNORMAL HIGH (ref 150–400)
RBC: 4.56 MIL/uL (ref 3.87–5.11)
RDW: 24.1 % — ABNORMAL HIGH (ref 11.5–15.5)
WBC: 8 10*3/uL (ref 4.0–10.5)
nRBC: 0 % (ref 0.0–0.2)

## 2022-02-11 LAB — TROPONIN I (HIGH SENSITIVITY): Troponin I (High Sensitivity): 5 ng/L (ref <18)

## 2022-02-11 MED FILL — Dexamethasone Sodium Phosphate Inj 100 MG/10ML: INTRAMUSCULAR | Qty: 1 | Status: AC

## 2022-02-11 MED FILL — Fosaprepitant Dimeglumine For IV Infusion 150 MG (Base Eq): INTRAVENOUS | Qty: 5 | Status: AC

## 2022-02-11 NOTE — ED Triage Notes (Signed)
Pt reports indigestion and sob today.  Hx reflux and uterine cancer.  Pt has a port, chemo tomorrow.  No chest pain. Pt took baking soda and water with some relief.  Pt alert.

## 2022-02-12 ENCOUNTER — Other Ambulatory Visit: Payer: Self-pay | Admitting: *Deleted

## 2022-02-12 ENCOUNTER — Encounter: Payer: Self-pay | Admitting: Oncology

## 2022-02-12 ENCOUNTER — Inpatient Hospital Stay (HOSPITAL_BASED_OUTPATIENT_CLINIC_OR_DEPARTMENT_OTHER): Payer: BC Managed Care – PPO | Admitting: Oncology

## 2022-02-12 ENCOUNTER — Inpatient Hospital Stay: Payer: BC Managed Care – PPO

## 2022-02-12 ENCOUNTER — Ambulatory Visit: Payer: BC Managed Care – PPO

## 2022-02-12 VITALS — BP 107/75 | HR 93 | Temp 98.9°F | Ht 65.0 in | Wt 242.4 lb

## 2022-02-12 DIAGNOSIS — Z79899 Other long term (current) drug therapy: Secondary | ICD-10-CM | POA: Diagnosis not present

## 2022-02-12 DIAGNOSIS — C541 Malignant neoplasm of endometrium: Secondary | ICD-10-CM | POA: Diagnosis not present

## 2022-02-12 DIAGNOSIS — R918 Other nonspecific abnormal finding of lung field: Secondary | ICD-10-CM | POA: Diagnosis not present

## 2022-02-12 DIAGNOSIS — G893 Neoplasm related pain (acute) (chronic): Secondary | ICD-10-CM

## 2022-02-12 DIAGNOSIS — R5383 Other fatigue: Secondary | ICD-10-CM | POA: Diagnosis not present

## 2022-02-12 DIAGNOSIS — Z86711 Personal history of pulmonary embolism: Secondary | ICD-10-CM | POA: Diagnosis not present

## 2022-02-12 DIAGNOSIS — E876 Hypokalemia: Secondary | ICD-10-CM

## 2022-02-12 DIAGNOSIS — T451X5A Adverse effect of antineoplastic and immunosuppressive drugs, initial encounter: Secondary | ICD-10-CM | POA: Diagnosis not present

## 2022-02-12 DIAGNOSIS — R590 Localized enlarged lymph nodes: Secondary | ICD-10-CM | POA: Diagnosis not present

## 2022-02-12 DIAGNOSIS — Z5112 Encounter for antineoplastic immunotherapy: Secondary | ICD-10-CM | POA: Diagnosis not present

## 2022-02-12 DIAGNOSIS — D500 Iron deficiency anemia secondary to blood loss (chronic): Secondary | ICD-10-CM | POA: Diagnosis not present

## 2022-02-12 DIAGNOSIS — D509 Iron deficiency anemia, unspecified: Secondary | ICD-10-CM | POA: Diagnosis not present

## 2022-02-12 DIAGNOSIS — Z5111 Encounter for antineoplastic chemotherapy: Secondary | ICD-10-CM | POA: Diagnosis not present

## 2022-02-12 DIAGNOSIS — Z7901 Long term (current) use of anticoagulants: Secondary | ICD-10-CM | POA: Diagnosis not present

## 2022-02-12 DIAGNOSIS — R110 Nausea: Secondary | ICD-10-CM

## 2022-02-12 DIAGNOSIS — I10 Essential (primary) hypertension: Secondary | ICD-10-CM | POA: Diagnosis not present

## 2022-02-12 LAB — CBC WITH DIFFERENTIAL/PLATELET
Abs Immature Granulocytes: 0.03 10*3/uL (ref 0.00–0.07)
Basophils Absolute: 0.1 10*3/uL (ref 0.0–0.1)
Basophils Relative: 1 %
Eosinophils Absolute: 0 10*3/uL (ref 0.0–0.5)
Eosinophils Relative: 1 %
HCT: 26.9 % — ABNORMAL LOW (ref 36.0–46.0)
Hemoglobin: 9.1 g/dL — ABNORMAL LOW (ref 12.0–15.0)
Immature Granulocytes: 1 %
Lymphocytes Relative: 32 %
Lymphs Abs: 1.9 10*3/uL (ref 0.7–4.0)
MCH: 22.8 pg — ABNORMAL LOW (ref 26.0–34.0)
MCHC: 33.8 g/dL (ref 30.0–36.0)
MCV: 67.4 fL — ABNORMAL LOW (ref 80.0–100.0)
Monocytes Absolute: 0.4 10*3/uL (ref 0.1–1.0)
Monocytes Relative: 7 %
Neutro Abs: 3.6 10*3/uL (ref 1.7–7.7)
Neutrophils Relative %: 58 %
Platelets: 385 10*3/uL (ref 150–400)
RBC: 3.99 MIL/uL (ref 3.87–5.11)
RDW: 23.8 % — ABNORMAL HIGH (ref 11.5–15.5)
WBC: 6 10*3/uL (ref 4.0–10.5)
nRBC: 0 % (ref 0.0–0.2)

## 2022-02-12 LAB — COMPREHENSIVE METABOLIC PANEL
ALT: 9 U/L (ref 0–44)
AST: 17 U/L (ref 15–41)
Albumin: 3.1 g/dL — ABNORMAL LOW (ref 3.5–5.0)
Alkaline Phosphatase: 66 U/L (ref 38–126)
Anion gap: 8 (ref 5–15)
BUN: 9 mg/dL (ref 6–20)
CO2: 29 mmol/L (ref 22–32)
Calcium: 8.7 mg/dL — ABNORMAL LOW (ref 8.9–10.3)
Chloride: 103 mmol/L (ref 98–111)
Creatinine, Ser: 0.84 mg/dL (ref 0.44–1.00)
GFR, Estimated: >60 mL/min (ref >60)
Glucose, Bld: 97 mg/dL (ref 70–99)
Potassium: 3.1 mmol/L — ABNORMAL LOW (ref 3.5–5.1)
Sodium: 140 mmol/L (ref 135–145)
Total Bilirubin: 0.6 mg/dL (ref 0.3–1.2)
Total Protein: 8 g/dL (ref 6.5–8.1)

## 2022-02-12 LAB — TSH: TSH: 1.88 u[IU]/mL (ref 0.350–4.500)

## 2022-02-12 MED ORDER — SODIUM CHLORIDE 0.9 % IV SOLN
Freq: Once | INTRAVENOUS | Status: AC
  Filled 2022-02-12: qty 250

## 2022-02-12 MED ORDER — POTASSIUM CHLORIDE CRYS ER 20 MEQ PO TBCR: 20.0000 meq | EXTENDED_RELEASE_TABLET | Freq: Every day | ORAL | 0 refills | Status: DC

## 2022-02-12 MED ORDER — PALONOSETRON HCL INJECTION 0.25 MG/5ML
0.2500 mg | Freq: Once | INTRAVENOUS | Status: AC
  Administered 2022-02-12: 0.25 mg via INTRAVENOUS
  Filled 2022-02-12: qty 5

## 2022-02-12 MED ORDER — LORAZEPAM 0.5 MG PO TABS: 0.5000 mg | ORAL_TABLET | Freq: Three times a day (TID) | ORAL | 0 refills | Status: DC

## 2022-02-12 MED ORDER — DIPHENHYDRAMINE HCL 50 MG/ML IJ SOLN
50.0000 mg | Freq: Once | INTRAMUSCULAR | Status: AC
  Administered 2022-02-12: 50 mg via INTRAVENOUS
  Filled 2022-02-12: qty 1

## 2022-02-12 MED ORDER — LORAZEPAM 2 MG/ML IJ SOLN
0.5000 mg | Freq: Once | INTRAMUSCULAR | Status: AC
  Administered 2022-02-12: 0.5 mg via INTRAVENOUS
  Filled 2022-02-12: qty 1

## 2022-02-12 MED ORDER — SODIUM CHLORIDE 0.9 % IV SOLN
200.0000 mg | Freq: Once | INTRAVENOUS | Status: AC
  Administered 2022-02-12: 200 mg via INTRAVENOUS
  Filled 2022-02-12: qty 200

## 2022-02-12 MED ORDER — SODIUM CHLORIDE 0.9 % IV SOLN
150.0000 mg | Freq: Once | INTRAVENOUS | Status: AC
  Administered 2022-02-12: 150 mg via INTRAVENOUS
  Filled 2022-02-12: qty 150

## 2022-02-12 MED ORDER — SODIUM CHLORIDE 0.9% FLUSH
10.0000 mL | Freq: Once | INTRAVENOUS | Status: AC
  Administered 2022-02-12: 10 mL via INTRAVENOUS
  Filled 2022-02-12: qty 10

## 2022-02-12 MED ORDER — POTASSIUM CHLORIDE IN NACL 20-0.9 MEQ/L-% IV SOLN
Freq: Once | INTRAVENOUS | Status: AC
  Filled 2022-02-12: qty 1000

## 2022-02-12 MED ORDER — HEPARIN SOD (PORK) LOCK FLUSH 100 UNIT/ML IV SOLN
500.0000 [IU] | Freq: Once | INTRAVENOUS | Status: AC | PRN
  Administered 2022-02-12: 500 [IU]
  Filled 2022-02-12: qty 5

## 2022-02-12 MED ORDER — SODIUM CHLORIDE 0.9 % IV SOLN
10.0000 mg | Freq: Once | INTRAVENOUS | Status: AC
  Administered 2022-02-12: 10 mg via INTRAVENOUS
  Filled 2022-02-12: qty 10

## 2022-02-12 MED ORDER — SODIUM CHLORIDE 0.9 % IV SOLN
175.0000 mg/m2 | Freq: Once | INTRAVENOUS | Status: AC
  Administered 2022-02-12: 402 mg via INTRAVENOUS
  Filled 2022-02-12: qty 67

## 2022-02-12 MED ORDER — FAMOTIDINE IN NACL 20-0.9 MG/50ML-% IV SOLN
20.0000 mg | Freq: Once | INTRAVENOUS | Status: AC
  Administered 2022-02-12: 20 mg via INTRAVENOUS
  Filled 2022-02-12: qty 50

## 2022-02-12 MED ORDER — SODIUM CHLORIDE 0.9 % IV SOLN
750.0000 mg | Freq: Once | INTRAVENOUS | Status: AC
  Administered 2022-02-12: 750 mg via INTRAVENOUS
  Filled 2022-02-12: qty 75

## 2022-02-12 MED FILL — Ferumoxytol Inj 510 MG/17ML (30 MG/ML) (Elemental Fe): INTRAVENOUS | Qty: 17 | Status: AC

## 2022-02-12 NOTE — Discharge Instructions (Addendum)
Please follow-up with your doctor.  As we discussed you may try over-the-counter liquid Maalox max to see if this helps with your symptoms in the future.  If you develop any chest pain pressure or shortness of breath please return immediately to the emergency department for further evaluation.

## 2022-02-12 NOTE — ED Provider Notes (Signed)
[facility] Provider Note    Event Date/Time   First MD Initiated Contact with Patient 02/12/22 [phone]     (approximate)  History   Chief Complaint: Shortness of Breath and Gastroesophageal Reflux  HPI  Jean Morris is a 46 y.o. female with a past medical history of anemia, hypertension, past PE on Eliquis, chemotherapy for uterine cancer, presents to the emergency department for belching and chest pressure.  According to the patient around 2 PM yesterday 12/19 she drank a Sprite soda shortly afterwards developed a sensation like she needed to belch but was unable to do so.  Patient states the sensation stayed with her all day and eventually she called EMS.  EMS recommended that she take baking soda.  Patient states she did that and after arriving to the emergency department states her discomfort has completely resolved and she no longer feels like she needs to belch.  Patient states this has happened previously as well.  Denies any chest pain at any point.  Denies any pleuritic pain.  No shortness of breath.  In reviewing the patient's chart it appears that she is currently taking Eliquis as well.  Physical Exam   Triage Vital Signs: ED Triage Vitals  Enc Vitals Group     BP 02/11/22 2230 (!) 117/98     Pulse Rate 02/11/22 2230 (!) 120     Resp 02/11/22 2230 (!) 22     Temp 02/11/22 2230 97.7 F (36.5 C)     Temp Source 02/11/22 2230 Oral     SpO2 02/11/22 2230 98 %     Weight 02/11/22 2231 244 lb (110.7 kg)     Height 02/11/22 2231 '5\' 5"'$  (1.651 m)     Head Circumference --      Peak Flow --      Pain Score 02/11/22 2231 0     Pain Loc --      Pain Edu? --      Excl. in [place]? --     Most recent vital signs: Vitals:   02/11/22 2230  BP: (!) 117/98  Pulse: (!) 120  Resp: (!) 22  Temp: 97.7 F (36.5 C)  SpO2: 98%    General: Awake, no distress.  CV:  Good peripheral perfusion.  Regular rate and rhythm around 100 bpm. Resp:  Normal effort.  Equal  breath sounds bilaterally.  No pleuritic pain. Abd:  No distention.  Soft, nontender.  No rebound or guarding.  Benign abdomen  ED Results / Procedures / Treatments   EKG  EKG viewed and interpreted by myself shows sinus tachycardia 127 bpm with a narrow QRS, normal axis, normal intervals, nonspecific ST changes without ST elevation.  I reviewed the patient's prior EKG from 2019 that also shows largely nonspecific ST findings.  RADIOLOGY  I have reviewed and interpreted the chest x-ray images.  Patient appears to have a right-sided chest port but no other findings, no consolidation seen on my evaluation. Radiology is read the chest x-ray as negative   MEDICATIONS ORDERED IN ED: Medications - No data to display   IMPRESSION / MDM / [place] / ED COURSE  I reviewed the triage vital signs and the nursing notes.  Patient's presentation is most consistent with acute presentation with potential threat to life or bodily function.  Patient presents to the emergency department for sensation of needing to belch/pressure.  Patient states after taking baking soda her symptoms have completely resolved.  No longer  feels the need to belch.  Patient was mildly tachycardic upon arrival however states she was very nervous when she got here currently pulse rate is around 100 bpm during my evaluation.  Patient is on Eliquis denies any pleuritic pain denies any shortness of breath denies any symptoms whatsoever currently including no chest pain at any point tonight.  Patient states she has chemotherapy scheduled for 8 AM and she is hopeful that she can go home and sleep.  Patient's workup in the emergency department has shown reassuring results including a normal chemistry, reassuring CBC and a negative troponin.  Symptoms started nearly 10 hours ago I believe a single troponin is sufficient to rule out ACS and as the patient is symptom-free with otherwise reassuring workup we will have the patient  follow-up with her oncologist tomorrow as scheduled.  We will discharge from the emergency department.  Patient agreeable to plan provided my typical chest pain return precautions.  FINAL CLINICAL IMPRESSION(S) / ED DIAGNOSES   Gastric reflux    Note:  This document was prepared using Dragon voice recognition software and may include unintentional dictation errors.   Harvest Dark, MD 02/12/22 [phone]

## 2022-02-12 NOTE — Progress Notes (Signed)
Hematology/Oncology Consult note [facility]  Telephone:([phone] [phone]  Patient Care Team: Center, [facility] as PCP - General (General Practice) [name], RN as Oncology Nurse Navigator   Name of the patient: Jean Morris  [id number]  02-Oct-1975   Date of visit: 02/12/22  Diagnosis- stage IVb endometrioid endometrial cancer   Chief complaint/ Reason for visit-on treatment assessment prior to cycle 2 of CarboTaxol Keytruda chemotherapy  Heme/Onc history: Patient is a 46 year old female who has seen [name] for irregular menstrual bleeding.  He has had an IUD in place and initially it was attribute it to use of IUD and was tried to be controlled with progesterone agents.  However due to worsening abdominal pain patient underwent a CT abdomen and pelvis with contrast on 12/25/2021 which showed 8 mm   Posterior lung base mass.  Retroperitoneal adenopathy measuring 2.6 cm additional retrocrural adenopathy.  Faint lucent lesion involving L2 vertebral body.  The uterus is anteverted and enlarged.  Endometrium is enlarged heterogeneous and irregular extending to the fundal myometrium.  Findings consistent with endometrial malignancy.  CT chest without contrast showed a 10 mm right lower lobe subpleural nodule.   Patient had endometrial biopsy which was consistent FIGO grade 2 endometrioid endometrial carcinoma.  Patient was seen by GYN oncology and hopes to take her for surgery if she has good response to neoadjuvant chemotherapy.MSI unstable.  BRAF negative.  Loss of major and minor MMR proteins MLH1 and PMS2 less than 5% of tumor expression.  MLH1 hyper methylation present.  This is most likely due to somatic epigenetic modification which can be seen in about 77% of sporadic endometrial cancers.   Patient lives with her partner and has 2 adult children.  She reports significant flank pain as well as abdominal pain.  Ongoing  vaginal bleeding.  She reports significant fatigue.  She has a prior history of pulmonary embolism in 2017 that was attributed to sequels for which she took anticoagulation for 1 year and stopped.  Interval history-patient was in the ERYesterday with symptoms of burping and indigestion.  She had a cardiac workup including EKG and troponins which were negative.  Patient has been taking Protonix 40 mg daily and symptoms of burping come and go.  She feels like she needs to keep burping but when that does not happen it gives her a sense of panic.  Vaginal bleeding has stopped.  ECOG PS- 1 Pain scale- 3 Opioid associated constipation- no  Review of systems- Review of Systems  Constitutional:  Negative for chills, fever, malaise/fatigue and weight loss.  HENT:  Negative for congestion, ear discharge and nosebleeds.   Eyes:  Negative for blurred vision.  Respiratory:  Negative for cough, hemoptysis, sputum production, shortness of breath and wheezing.   Cardiovascular:  Negative for chest pain, palpitations, orthopnea and claudication.  Gastrointestinal:  Negative for abdominal pain, blood in stool, constipation, diarrhea, heartburn, melena, nausea and vomiting.       Burping/ indugestion  Genitourinary:  Negative for dysuria, flank pain, frequency, hematuria and urgency.  Musculoskeletal:  Negative for back pain, joint pain and myalgias.  Skin:  Negative for rash.  Neurological:  Negative for dizziness, tingling, focal weakness, seizures, weakness and headaches.  Endo/Heme/Allergies:  Does not bruise/bleed easily.  Psychiatric/Behavioral:  Negative for depression and suicidal ideas. The patient does not have insomnia.       Allergies  Allergen Reactions   Bee Pollen Nausea And Vomiting  Itchy, swelling, watery eyes, runny nose.   Pollen Extract Nausea And Vomiting    Itchy, swelling, watery eyes, runny nose.     Past Medical History:  Diagnosis Date   Anemia    Hypertension     Menorrhagia    Pulmonary embolism [facility])      Past Surgical History:  Procedure Laterality Date   CESAREAN SECTION  1994   COLONOSCOPY WITH PROPOFOL N/A 08/06/2021   Procedure: COLONOSCOPY WITH PROPOFOL;  Surgeon: Jonathon Bellows, MD;  Location[facility] ENDOSCOPY;  Service: Gastroenterology;  Laterality: N/A;   ESOPHAGOGASTRODUODENOSCOPY (EGD) WITH PROPOFOL N/A 02/09/2020   Procedure: ESOPHAGOGASTRODUODENOSCOPY (EGD) WITH PROPOFOL;  Surgeon: Jonathon Bellows, MD;  Location: [facility] ENDOSCOPY;  Service: Gastroenterology;  Laterality: N/A;   IR IMAGING GUIDED PORT INSERTION  01/17/2022    Social History   Socioeconomic History   Marital status: Soil scientist    Spouse name: Not on file   Number of children: Not on file   Years of education: Not on file   Highest education level: Not on file  Occupational History   Not on file  Tobacco Use   Smoking status: Never   Smokeless tobacco: Never  Vaping Use   Vaping Use: Never used  Substance and Sexual Activity   Alcohol use: No   Drug use: No   Sexual activity: Not Currently    Birth control/protection: I.U.D.    Comment: patient states MD said did not see in CT today 01/17/2022  Other Topics Concern   Not on file  Social History Narrative   Lives with [facility], partner, and no pets.   Social Determinants of Health   Financial Resource Strain: Not on file  Food Insecurity: Not on file  Transportation Needs: Not on file  Physical Activity: Not on file  Stress: Not on file  Social Connections: Not on file  Intimate Partner Violence: Not on file    Family History  Problem Relation Age of Onset   Hypertension Mother    Stroke Mother    Heart attack Mother    Cancer Father    Hypertension Father    Cancer Sister    Cancer Sister    Cancer Brother    Hypertension Other      Current Outpatient Medications:    apixaban (ELIQUIS) 2.5 MG TABS tablet, Take 1 tablet (2.5 mg total) by mouth 2 (two) times daily., Disp: 60 tablet, Rfl:  3   dexamethasone (DECADRON) 4 MG tablet, Take 2 tablets by mouth starting the day after chemotherapy for 3 days. Take with food., Disp: 30 tablet, Rfl: 1   ferrous sulfate 325 (65 FE) MG tablet, Take 1 tablet (325 mg total) by mouth 2 (two) times daily with a meal. (Patient taking differently: Take 325 mg by mouth 3 (three) times daily with meals.), Disp: 90 tablet, Rfl: 7   levonorgestrel (MIRENA) 20 MCG/24HR IUD, 1 Intra Uterine Device (1 each total) by Intrauterine route once., Disp: 1 each, Rfl: 0   lidocaine-prilocaine (EMLA) cream, Apply to affected area once, Disp: 30 g, Rfl: 3   nystatin (MYCOSTATIN/NYSTOP) powder, Apply 1 Application topically 3 (three) times daily., Disp: 30 g, Rfl: 0   omeprazole (PRILOSEC) 40 MG capsule, Take 1 capsule (40 mg total) by mouth daily. For 6 weeks and then stop., Disp: 42 capsule, Rfl: 0   ondansetron (ZOFRAN) 8 MG tablet, Take 1 tablet (8 mg total) by mouth every 8 (eight) hours as needed for nausea or vomiting. Start on the third  day after chemotherapy., Disp: 30 tablet, Rfl: 1   oxyCODONE (OXY IR/ROXICODONE) 5 MG immediate release tablet, Take 1 tablet (5 mg total) by mouth every 4 (four) hours as needed for severe pain., Disp: 120 tablet, Rfl: 0   prochlorperazine (COMPAZINE) 10 MG tablet, Take 1 tablet (10 mg total) by mouth every 6 (six) hours as needed for nausea or vomiting., Disp: 30 tablet, Rfl: 1   polyethylene glycol-electrolytes (NULYTELY) 420 g solution, Drink one 8 oz glass of mixture every 15 minutes until you finish all of the jug., Disp: 4000 mL, Rfl: 0  Physical exam:  Vitals:   02/12/22 0841  BP: 107/75  Pulse: 93  Temp: 98.9 F (37.2 C)  TempSrc: Oral  Weight: 242 lb 6.4 oz (110 kg)  Height: _0  (1.651 m)   Physical Exam Cardiovascular:     Rate and Rhythm: Normal rate and regular rhythm.     Heart sounds: Normal heart sounds.  Pulmonary:     Effort: Pulmonary effort is normal.     Breath sounds: Normal breath sounds.   Abdominal:     General: Bowel sounds are normal.     Palpations: Abdomen is soft.  Skin:    General: Skin is warm and dry.  Neurological:     Mental Status: She is alert and oriented to person, place, and time.         Latest Ref Rng & Units 02/11/2022   10:34 PM  CMP  Glucose 70 - 99 mg/dL 121   BUN 6 - 20 mg/dL 10   Creatinine 0.44 - 1.00 mg/dL 0.94   Sodium 135 - 145 mmol/L 141   Potassium 3.5 - 5.1 mmol/L 3.2   Chloride 98 - 111 mmol/L 106   CO2 22 - 32 mmol/L 26   Calcium 8.9 - 10.3 mg/dL 9.5       Latest Ref Rng & Units 02/12/2022    8:24 AM  CBC  WBC 4.0 - 10.5 K/uL 6.0   Hemoglobin 12.0 - 15.0 g/dL 9.1   Hematocrit 36.0 - 46.0 % 26.9   Platelets 150 - 400 K/uL 385     No images are attached to the encounter.  DG Chest 2 View  Result Date: 02/11/2022 CLINICAL DATA:  Shortness of breath EXAM: CHEST - 2 VIEW COMPARISON:  11/03/2019 FINDINGS: Lungs are clear.  No pleural effusion or pneumothorax. The heart is normal in size. Right chest power port terminates in the lower SVC. Visualized osseous structures are within normal limits. IMPRESSION: Normal chest radiographs. Electronically Signed   By: Julian Hy M.D.   On: 02/11/2022 23:16   IR IMAGING GUIDED PORT INSERTION  Result Date: 01/17/2022 CLINICAL DATA:  ENDOMETRIAL CARCINOMA, ACCESS FOR CHEMOTHERAPY EXAM: RIGHT INTERNAL JUGULAR SINGLE LUMEN POWER PORT CATHETER INSERTION Date:  01/17/2022 01/17/2022 11:53 am Radiologist:  M. Daryll Brod, MD Guidance:  Ultrasound and fluoroscopic MEDICATIONS: 1% lidocaine local with epinephrine ANESTHESIA/SEDATION: Versed 2.0 mg IV; Fentanyl 100 mcg IV; Moderate Sedation Time:  25 minute The patient was continuously monitored during the procedure by the interventional radiology nurse under my direct supervision. FLUOROSCOPY: One minutes, 24 seconds (24 mGy) COMPLICATIONS: None immediate. CONTRAST:  None. PROCEDURE: Informed consent was obtained from the patient following  explanation of the procedure, risks, benefits and alternatives. The patient understands, agrees and consents for the procedure. All questions were addressed. A time out was performed. Maximal barrier sterile technique utilized including caps, mask, sterile gowns, sterile gloves, large sterile drape, hand hygiene,  and 2% chlorhexidine scrub. Under sterile conditions and local anesthesia, right internal jugular micropuncture venous access was performed. Access was performed with ultrasound. Images were obtained for documentation of the patent right internal jugular vein. A guide wire was inserted followed by a transitional dilator. This allowed insertion of a guide wire and catheter into the IVC. Measurements were obtained from the SVC / RA junction back to the right IJ venotomy site. In the right infraclavicular chest, a subcutaneous pocket was created over the second anterior rib. This was done under sterile conditions and local anesthesia. 1% lidocaine with epinephrine was utilized for this. A 2.5 cm incision was made in the skin. Blunt dissection was performed to create a subcutaneous pocket over the right pectoralis major muscle. The pocket was flushed with saline vigorously. There was adequate hemostasis. The port catheter was assembled and checked for leakage. The port catheter was secured in the pocket with two retention sutures. The tubing was tunneled subcutaneously to the right venotomy site and inserted into the SVC/RA junction through a valved peel-away sheath. Position was confirmed with fluoroscopy. Images were obtained for documentation. The patient tolerated the procedure well. No immediate complications. Incisions were closed in a two layer fashion with 4 - 0 Vicryl suture. Dermabond was applied to the skin. The port catheter was accessed, blood was aspirated followed by saline and heparin flushes. Needle was removed. A dry sterile dressing was applied. IMPRESSION: Ultrasound and fluoroscopically  guided right internal jugular single lumen power port catheter insertion. Tip in the SVC/RA junction. Catheter ready for use. Electronically Signed   By: Jerilynn Mages.  Shick M.D.   On: 01/17/2022 12:02     Assessment and plan- Patient is a 46 y.o. female with stage IV endometrioid endometrial carcinoma T2 N2 M1.  She is here for on treatment assessment prior to cycle 2 of CarboTaxol Keytruda chemotherapy  Counts okay to proceed with cycle 2 of CarboTaxol Keytruda chemotherapy today.  I am adding Keytruda given that her molecular testing showed MSI instability.  I will see her back in 3 weeks for cycle 3 and following that we will get repeat scans.  Microcytic anemia: Likely secondary to chronic blood loss from vaginal bleeding.  She has received 2 doses of Feraheme so far and I will give her 1 more dose today.  Patient is on Eliquis 2.5 mg twice daily for DVT prophylaxis which we will continue.  Burping/indigestion: Etiology unclear.  She was in the ER yesterday and underwent cardiac workup which was negative.  She has had some of the symptoms come back during infusion and I am giving her 1 dose of Ativan.  If symptoms persist I may have to consider getting a CT chest to rule out PE.  Vitals remained stable.  She is not hypoxic and overall tachycardia is improved as compared to where she was 3 weeks ago.   Visit Diagnosis 1. Encounter for antineoplastic chemotherapy   2. Endometrial cancer ([place])   3. Encounter for antineoplastic immunotherapy   4. Iron deficiency anemia due to chronic blood loss   5. Neoplasm related pain      Dr. Randa Evens, MD, MPH [facility] [phone] 02/12/2022 8:47 AM

## 2022-02-12 NOTE — Patient Instructions (Signed)
[facility]  Discharge Instructions: Thank you for choosing [facility] to provide your oncology and hematology care.  If you have a lab appointment with the Cancer Center, please go directly to the Cancer Center and check in at the registration area.  Wear comfortable clothing and clothing appropriate for easy access to any Portacath or PICC line.   We strive to give you quality time with your provider. You may need to reschedule your appointment if you arrive late (15 or more minutes).  Arriving late affects you and other patients whose appointments are after yours.  Also, if you miss three or more appointments without notifying the office, you may be dismissed from the clinic at the provider's discretion.      For prescription refill requests, have your pharmacy contact our office and allow 72 hours for refills to be completed.       To help prevent nausea and vomiting after your treatment, we encourage you to take your nausea medication as directed.  BELOW ARE SYMPTOMS THAT SHOULD BE REPORTED IMMEDIATELY: *FEVER GREATER THAN 100.4 F (38 C) OR HIGHER *CHILLS OR SWEATING *NAUSEA AND VOMITING THAT IS NOT CONTROLLED WITH YOUR NAUSEA MEDICATION *UNUSUAL SHORTNESS OF BREATH *UNUSUAL BRUISING OR BLEEDING *URINARY PROBLEMS (pain or burning when urinating, or frequent urination) *BOWEL PROBLEMS (unusual diarrhea, constipation, pain near the anus) TENDERNESS IN MOUTH AND THROAT WITH OR WITHOUT PRESENCE OF ULCERS (sore throat, sores in mouth, or a toothache) UNUSUAL RASH, SWELLING OR PAIN  UNUSUAL VAGINAL DISCHARGE OR ITCHING   Items with * indicate a potential emergency and should be followed up as soon as possible or go to the Emergency Department if any problems should occur.  Please show the CHEMOTHERAPY ALERT CARD or IMMUNOTHERAPY ALERT CARD at check-in to the Emergency Department and triage nurse.  Should you have questions after your  visit or need to cancel or reschedule your appointment, please contact [facility]  [phone] and follow the prompts.  Office hours are 8:00 a.m. to 4:30 p.m. Monday - Friday. Please note that voicemails left after 4:00 p.m. may not be returned until the following business day.  We are closed weekends and major holidays. You have access to a nurse at all times for urgent questions. Please call the main number to the clinic [phone] and follow the prompts.  For any non-urgent questions, you may also contact your provider using MyChart. We now offer e-Visits for anyone 18 and older to request care online for non-urgent symptoms. For details visit [email or web address].   Also download the MyChart app! Go to the app store, search "MyChart", open the app, select [facility], and log in with your MyChart username and password.  Masks are optional in the cancer centers. If you would like for your care team to wear a mask while they are taking care of you, please let them know. For doctor visits, patients may have with them one support person who is at least 48 years old. At this time, visitors are not allowed in the infusion area.   

## 2022-02-12 NOTE — Addendum Note (Signed)
Addended by: Sindy Guadeloupe on: 02/12/2022 05:44 PM   Modules accepted: Orders

## 2022-02-12 NOTE — Progress Notes (Signed)
1200: Pt started to continuously burping and felt a flush after receiving her last pre-med which was Benadryl that was given over 5 mins. Then she felt the urge to urinate and then felt like she was going to throw up.   VS: 144/85 and HR 103. She stated maybe she was just anxious. RN waited to see if there were any changes. Notified MD and team at 1220.  1228: MD stated to wait 15 mins and then proceed with treatment.  1229: Pt started to feel tingling to the lower part of her face. MD Notified.  1233: VS taken 150/106 and HR 100. MD at chairside.  MD stated to give Ativan (See MAR) and to proceed with treatment.  1330: Pt was asleep. RN woke pt and she stated she felt better.   1400: Pt is eating and no more concerns or heartburn.

## 2022-02-13 ENCOUNTER — Inpatient Hospital Stay: Payer: BC Managed Care – PPO

## 2022-02-14 ENCOUNTER — Other Ambulatory Visit: Payer: Self-pay | Admitting: Oncology

## 2022-02-14 DIAGNOSIS — C541 Malignant neoplasm of endometrium: Secondary | ICD-10-CM

## 2022-02-16 ENCOUNTER — Other Ambulatory Visit: Payer: Self-pay

## 2022-02-16 ENCOUNTER — Emergency Department
Admission: EM | Admit: 2022-02-16 | Discharge: 2022-02-16 | Disposition: A | Discharge status: Home / Self Care | Payer: BC Managed Care – PPO | Attending: Emergency Medicine | Admitting: Emergency Medicine

## 2022-02-16 ENCOUNTER — Emergency Department: Payer: BC Managed Care – PPO

## 2022-02-16 DIAGNOSIS — R0600 Dyspnea, unspecified: Secondary | ICD-10-CM | POA: Diagnosis not present

## 2022-02-16 DIAGNOSIS — R000 Tachycardia, unspecified: Secondary | ICD-10-CM | POA: Diagnosis not present

## 2022-02-16 DIAGNOSIS — E876 Hypokalemia: Secondary | ICD-10-CM | POA: Diagnosis not present

## 2022-02-16 DIAGNOSIS — Z8542 Personal history of malignant neoplasm of other parts of uterus: Secondary | ICD-10-CM | POA: Insufficient documentation

## 2022-02-16 DIAGNOSIS — R079 Chest pain, unspecified: Secondary | ICD-10-CM | POA: Diagnosis not present

## 2022-02-16 DIAGNOSIS — Z20822 Contact with and (suspected) exposure to covid-19: Secondary | ICD-10-CM | POA: Diagnosis not present

## 2022-02-16 DIAGNOSIS — Z7901 Long term (current) use of anticoagulants: Secondary | ICD-10-CM | POA: Diagnosis not present

## 2022-02-16 DIAGNOSIS — R0602 Shortness of breath: Secondary | ICD-10-CM | POA: Diagnosis not present

## 2022-02-16 DIAGNOSIS — R918 Other nonspecific abnormal finding of lung field: Secondary | ICD-10-CM | POA: Diagnosis not present

## 2022-02-16 LAB — MAGNESIUM: Magnesium: 1.9 mg/dL (ref 1.7–2.4)

## 2022-02-16 LAB — TROPONIN I (HIGH SENSITIVITY)
Troponin I (High Sensitivity): 11 ng/L (ref <18)
Troponin I (High Sensitivity): 17 ng/L (ref <18)

## 2022-02-16 LAB — BASIC METABOLIC PANEL
Anion gap: 11 (ref 5–15)
BUN: 17 mg/dL (ref 6–20)
CO2: 21 mmol/L — ABNORMAL LOW (ref 22–32)
Calcium: 9.4 mg/dL (ref 8.9–10.3)
Chloride: 105 mmol/L (ref 98–111)
Creatinine, Ser: 0.83 mg/dL (ref 0.44–1.00)
GFR, Estimated: >60 mL/min (ref >60)
Glucose, Bld: 126 mg/dL — ABNORMAL HIGH (ref 70–99)
Potassium: 3.2 mmol/L — ABNORMAL LOW (ref 3.5–5.1)
Sodium: 137 mmol/L (ref 135–145)

## 2022-02-16 LAB — CBC
HCT: 34.6 % — ABNORMAL LOW (ref 36.0–46.0)
Hemoglobin: 11.7 g/dL — ABNORMAL LOW (ref 12.0–15.0)
MCH: 22.7 pg — ABNORMAL LOW (ref 26.0–34.0)
MCHC: 33.8 g/dL (ref 30.0–36.0)
MCV: 67.1 fL — ABNORMAL LOW (ref 80.0–100.0)
Platelets: 391 10*3/uL (ref 150–400)
RBC: 5.16 MIL/uL — ABNORMAL HIGH (ref 3.87–5.11)
RDW: 22.9 % — ABNORMAL HIGH (ref 11.5–15.5)
WBC: 5.4 10*3/uL (ref 4.0–10.5)
nRBC: 0 % (ref 0.0–0.2)

## 2022-02-16 LAB — RESP PANEL BY RT-PCR (RSV, FLU A&B, COVID)  RVPGX2
Influenza A by PCR: NEGATIVE
Influenza B by PCR: NEGATIVE
Resp Syncytial Virus by PCR: NEGATIVE
SARS Coronavirus 2 by RT PCR: NEGATIVE

## 2022-02-16 LAB — BRAIN NATRIURETIC PEPTIDE: B Natriuretic Peptide: 43.2 pg/mL (ref 0.0–100.0)

## 2022-02-16 MED ORDER — SODIUM CHLORIDE 0.9 % IV BOLUS
500.0000 mL | Freq: Once | INTRAVENOUS | Status: AC
  Administered 2022-02-16: 500 mL via INTRAVENOUS

## 2022-02-16 MED ORDER — PANTOPRAZOLE SODIUM 40 MG PO TBEC: 40.0000 mg | DELAYED_RELEASE_TABLET | Freq: Two times a day (BID) | ORAL | 0 refills | Status: DC

## 2022-02-16 MED ORDER — FAMOTIDINE IN NACL 20-0.9 MG/50ML-% IV SOLN
20.0000 mg | Freq: Once | INTRAVENOUS | Status: AC
  Administered 2022-02-16: 20 mg via INTRAVENOUS
  Filled 2022-02-16: qty 50

## 2022-02-16 MED ORDER — SUCRALFATE 1 G PO TABS: 1.0000 g | ORAL_TABLET | Freq: Three times a day (TID) | ORAL | 0 refills | Status: DC

## 2022-02-16 MED ORDER — MAGNESIUM 400 MG PO CAPS: 1.0000 | ORAL_CAPSULE | Freq: Every day | ORAL | 0 refills | Status: AC

## 2022-02-16 MED ORDER — FENTANYL CITRATE PF 50 MCG/ML IJ SOSY: 50.0000 ug | PREFILLED_SYRINGE | Freq: Once | INTRAMUSCULAR | Status: DC

## 2022-02-16 MED ORDER — ALUM & MAG HYDROXIDE-SIMETH 200-200-20 MG/5ML PO SUSP
30.0000 mL | Freq: Once | ORAL | Status: AC
  Administered 2022-02-16: 30 mL via ORAL
  Filled 2022-02-16: qty 30

## 2022-02-16 MED ORDER — POTASSIUM CHLORIDE CRYS ER 20 MEQ PO TBCR: 20.0000 meq | EXTENDED_RELEASE_TABLET | Freq: Two times a day (BID) | ORAL | 0 refills | Status: DC

## 2022-02-16 MED ORDER — IOHEXOL 350 MG/ML SOLN
75.0000 mL | Freq: Once | INTRAVENOUS | Status: AC | PRN
  Administered 2022-02-16: 75 mL via INTRAVENOUS

## 2022-02-16 MED ORDER — POTASSIUM CHLORIDE CRYS ER 20 MEQ PO TBCR: 40.0000 meq | EXTENDED_RELEASE_TABLET | Freq: Once | ORAL | Status: DC

## 2022-02-16 MED ORDER — SODIUM CHLORIDE 0.9 % IV BOLUS
1000.0000 mL | Freq: Once | INTRAVENOUS | Status: AC
  Administered 2022-02-16: 1000 mL via INTRAVENOUS

## 2022-02-16 NOTE — ED Notes (Signed)
Blue top sent. 20# to L AC placed.

## 2022-02-16 NOTE — Discharge Instructions (Addendum)
Start the protonix TWICE a day as prescribed  Take the carafate as well for 1 week to help with reflux  You can take over-the-counter Pepcid if needed  Take the potassium and magnesium as prescribed  Monitor your heart rate at home if able, and call your primary for repeat labs int he next 5-7 days

## 2022-02-16 NOTE — ED Provider Notes (Signed)
[facility] Provider Note    Event Date/Time   First MD Initiated Contact with Patient 02/16/22 1203     (approximate)   History   Chest Pain and Shortness of Breath   HPI  Jean Morris is a 46 y.o. female  here with sob.  Pt has recently diagnosed endometrial CA, arrives w/ c/o SOB. She states she has had worsening SOB for the past week or so, worse with lying flat and with exertion. She feels SOB then this resolves with rest. She denies any associated chest pain.S he does endorse a burning sensation in her chest that feels like heartburn, which has been worsening since she started chemo. No leg swelling. She has a h/o PE but has been taking her anticoauglants. H/o anemia as well denies any bleeding. Sx resolve with rest. She has a h/o tachycardia as well and notes her HR has been increased since starting chemo.      Physical Exam   Triage Vital Signs: ED Triage Vitals  Enc Vitals Group     BP 02/16/22 1128 100/86     Pulse Rate 02/16/22 1128 (!) 145     Resp 02/16/22 1128 20     Temp 02/16/22 1128 97.9 F (36.6 C)     Temp Source 02/16/22 1128 Oral     SpO2 02/16/22 1128 99 %     Weight 02/16/22 1133 240 lb (108.9 kg)     Height 02/16/22 1133 '5\' 5"'$  (1.651 m)     Head Circumference --      Peak Flow --      Pain Score 02/16/22 1132 2     Pain Loc --      Pain Edu? --      Excl. in [place]? --     Most recent vital signs: Vitals:   02/16/22 1806 02/16/22 1815  BP: 109/81   Pulse: (!) 108   Resp: 17   Temp:  98.1 F (36.7 C)  SpO2: 100%      General: Awake, no distress.  CV:  Good peripheral perfusion. Tachycardic, no murmurs or rubs. Resp:  Normal effort. Lungs clear bilaterally. Abd:  No distention. No tenderness. Other:  No LE edema. No asymmetry.   ED Results / Procedures / Treatments   Labs (all labs ordered are listed, but only abnormal results are displayed) Labs Reviewed  BASIC METABOLIC PANEL - Abnormal; Notable for the  following components:      Result Value   Potassium 3.2 (*)    CO2 21 (*)    Glucose, Bld 126 (*)    All other components within normal limits  CBC - Abnormal; Notable for the following components:   RBC 5.16 (*)    Hemoglobin 11.7 (*)    HCT 34.6 (*)    MCV 67.1 (*)    MCH 22.7 (*)    RDW 22.9 (*)    All other components within normal limits  RESP PANEL BY RT-PCR (RSV, FLU A&B, COVID)  RVPGX2  BRAIN NATRIURETIC PEPTIDE  MAGNESIUM  POC URINE PREG, ED  TROPONIN I (HIGH SENSITIVITY)  TROPONIN I (HIGH SENSITIVITY)     EKG Sinus tachycardia, VR 109. PR 140, QRS 107, QTc 473. Non-specific ST changes, no acute ST elevations.   RADIOLOGY CXR: Clear CT Angio: Clear, no PE, nodule improved   I also independently reviewed and agree with radiologist interpretations.   PROCEDURES:  Critical Care performed: No  .1-3 Lead EKG Interpretation  Performed  by: Duffy Bruce, MD Authorized by: Duffy Bruce, MD     Interpretation: non-specific     ECG rate:  100-140   ECG rate assessment: tachycardic     Rhythm: sinus tachycardia     Ectopy: none     Conduction: normal   Comments:     Indication: Weakness, SOB     MEDICATIONS ORDERED IN ED: Medications  potassium chloride SA (KLOR-CON M) CR tablet 40 mEq (has no administration in time range)  sodium chloride 0.9 % bolus 1,000 mL (0 mLs Intravenous Stopped 02/16/22 1817)  iohexol (OMNIPAQUE) 350 MG/ML injection 75 mL (75 mLs Intravenous Contrast Given 02/16/22 1306)  alum & mag hydroxide-simeth (MAALOX/MYLANTA) 200-200-20 MG/5ML suspension 30 mL (30 mLs Oral Given 02/16/22 1359)  famotidine (PEPCID) IVPB 20 mg premix (0 mg Intravenous Stopped 02/16/22 1432)  sodium chloride 0.9 % bolus 500 mL (0 mLs Intravenous Stopped 02/16/22 1817)     IMPRESSION / MDM / ASSESSMENT AND PLAN / ED COURSE  I reviewed the triage vital signs and the nursing notes.                              Differential diagnosis includes, but is  not limited to, PE, CHF, ACS, deconditioning, symptomatic anemia, reflux w/ dyspnea.  Patient's presentation is most consistent with acute presentation with potential threat to life or bodily function.  The patient is on the cardiac monitor to evaluate for evidence of arrhythmia and/or significant heart rate changes  46 yo F with h/o endometrial CA, PE on anticoagulation here with SOB with exertion. No h/o ACS or CHF. She is tachycardic but this has been well documented previously since starting her chemo. No overt arrhythmia. Labs are initially overall very reassuring - CBC shows no worsening anemia. BMP does show mild hypokalemia which will be repleted. Troponin negative. CT Angio obtained given her history, reviewed, and is negative.   Plan to f/u repeat trop. Pt is ambulatory with no hypoxia but remains tachycardic, though as mentioned this has been extensively documented. She has borderline low BP at baseline so I am hesitant to start on any AV nodal blockers. She interestingly feels "much better" after GI cocktail so there could be a component of dyspnea 2/2 GERD. Will treat with increased antacid regimen, hydration, outpt f/u if Trop neg.  FINAL CLINICAL IMPRESSION(S) / ED DIAGNOSES   Final diagnoses:  Dyspnea, unspecified type  Sinus tachycardia     Rx / DC Orders   ED Discharge Orders          Ordered    potassium chloride SA (KLOR-CON M) 20 MEQ tablet  2 times daily        02/16/22 1553    Magnesium 400 MG CAPS  Daily        02/16/22 1553    pantoprazole (PROTONIX) 40 MG tablet  2 times daily        02/16/22 1601    sucralfate (CARAFATE) 1 g tablet  3 times daily with meals & bedtime        02/16/22 1601    Ambulatory referral to Cardiology        02/16/22 1640             Note:  This document was prepared using Dragon voice recognition software and may include unintentional dictation errors.   Duffy Bruce, MD 02/16/22 [id number]

## 2022-02-16 NOTE — ED Notes (Signed)
Pt walked well with walker assist, 100% saturation no DOE but HR up to 144 back down to 117 once reclined in bed. MD notified.

## 2022-02-16 NOTE — ED Triage Notes (Signed)
Pt to ED for CP and SOB that started this AM  Pt has port to R chest for chemo  Pt unsure if CP is just SOB or reflux, mostly just hurts with burping  HR is 140-150 and does appear winded but not labored with respirations  Hx PE years ago, takes eliquis BID

## 2022-02-16 NOTE — ED Provider Triage Note (Signed)
Emergency Medicine Provider Triage Evaluation Note  Jean Morris  a 46 y.o. female  was evaluated in triage.  Pt complains of belching and shortness of breath.  Patient presents to the ED primarily for shortness of breath that began this a.m.  She notes the chest pain is related to her need to belch and release gas.  She is unsure if this is simply just her reflux but it hurts primarily when she is belching.  She has a history of cancer, and has a port in her right upper chest for chemo which she last received on 12/20.   Review of Systems  Positive: SOB, CP Negative: FCS  Physical Exam  BP 100/86 (BP Location: Left Arm)   Pulse (!) 145   Temp 97.9 F (36.6 C) (Oral)   Resp 20   Ht '5\' 5"'$  (1.651 m)   Wt 108.9 kg   LMP  (LMP Unknown)   SpO2 99%   BMI 39.94 kg/m  Gen:   Awake, no distress  Actively belching Resp:  Normal effort CTA MSK:   Moves extremities without difficulty  Other:  Sinus Tachy rate  Medical Decision Making  Medically screening exam initiated at 11:36 AM.  Appropriate orders placed.  Jean Morris was informed that the remainder of the evaluation will be completed by another provider, this initial triage assessment does not replace that evaluation, and the importance of remaining in the ED until their evaluation is complete.  Patient with a history of endometrial cancer on chemo, presents to the ED for evaluation of shortness of breath as well as some intermittent belching she believes is related to her recently diagnosed reflux.  She denies any NVD or FCS.   Melvenia Needles, PA-C 02/16/22 1140

## 2022-02-16 NOTE — ED Provider Notes (Signed)
Repeat trop 11--17 but this was a 4 hour delta instead of 2 hour delta so I doubt this represents ACS.  Her magnesium is also reassuring.  On repeat evaluation she denies any discomfort and patient was already typed up for discharge by Dr. Ellender Hose if troponin and magnesium were reassuring.  Patient felt comfortable with this plan.  Did place a cardiology referral.  We did have a discussion that I cannot predict cardiac disease and if her symptoms are changing or worsening she should always come back in to have a repeat troponin done but at this time she describes it more of a burping sensation seems more likely acid reflux.    [facility] [place], MD 02/16/22 1640

## 2022-02-21 ENCOUNTER — Other Ambulatory Visit: Payer: Self-pay | Admitting: *Deleted

## 2022-02-21 ENCOUNTER — Telehealth: Payer: Self-pay | Admitting: *Deleted

## 2022-02-21 NOTE — Telephone Encounter (Signed)
Ok to take protonix

## 2022-02-21 NOTE — Telephone Encounter (Signed)
Jean Morris called and nurse returned call that she was seen in ED on 02/16/22 and protonix 40 mg twice a day was ordered for acid reflux, which patient was told was contributing to her shortness of breath.  Jean Morris wanted to make sure Dr Janese Banks is ok with her taking this medication.

## 2022-02-28 ENCOUNTER — Telehealth: Payer: Self-pay | Admitting: *Deleted

## 2022-02-28 MED ORDER — LACTULOSE 10 GM/15ML PO SOLN: 10.0000 g | Freq: Two times a day (BID) | ORAL | 0 refills | Status: DC | PRN

## 2022-02-28 NOTE — Telephone Encounter (Signed)
I called and spoke with patient by phone.  No nausea or vomiting or abdominal distention reported.  Last bowel movement 4 days ago.  She feels her stomach rumbling but is yet to be successful initiating a bowel movement with two days of milk of mag.  She tried an enema several days ago without success. We discussed laxatives/glycerin suppositories.  Will send her in a prescription for lactulose.  Discussed importance of daily bowel regimen with target of one soft stool daily or every other day. ED triggers reviewed.

## 2022-02-28 NOTE — Telephone Encounter (Signed)
Patient called reporting that she is having constipation. This is day 4 of no BM. She has taken Miralax daily until today and she took MOM 30 ml last evening and today, but still has had nothing more than gas. Please advise

## 2022-02-28 NOTE — Telephone Encounter (Signed)
Can you please call her? Lactulose or golytely are both options at this time before we try linzess or movantik

## 2022-03-03 ENCOUNTER — Other Ambulatory Visit: Payer: Self-pay | Admitting: Oncology

## 2022-03-04 ENCOUNTER — Telehealth: Payer: Self-pay | Admitting: *Deleted

## 2022-03-04 MED FILL — Fosaprepitant Dimeglumine For IV Infusion 150 MG (Base Eq): INTRAVENOUS | Qty: 5 | Status: AC

## 2022-03-04 MED FILL — Dexamethasone Sodium Phosphate Inj 100 MG/10ML: INTRAMUSCULAR | Qty: 1 | Status: AC

## 2022-03-04 NOTE — Telephone Encounter (Signed)
Patient called reporting that she is having problems with burping a lot and thus she is not comfortable eating as she feels it does not want ot go down. She did finally  have a good bowel movement after taking the Lactulose and a suppository yesterday. I advised that she get and take some Gas Ex (Simethicone) for the burping and see how that does She has an appointment tomorrow with Dr Janese Banks , lab, infusion and she will further discuss with doctor at that appointment.

## 2022-03-04 NOTE — Telephone Encounter (Signed)
Component Ref Range & Units 2 wk ago (02/16/22) 2 wk ago (02/12/22) 3 wk ago (02/11/22) 1 mo ago (01/30/22) 1 mo ago (01/29/22) 1 mo ago (01/21/22) 1 mo ago (01/15/22)  Potassium 3.5 - 5.1 mmol/L 3.2 Low  3.1 Low  3.2 Low  4.1 3.7 4.3 4.3

## 2022-03-05 ENCOUNTER — Encounter: Payer: Self-pay | Admitting: Oncology

## 2022-03-05 ENCOUNTER — Telehealth: Payer: Self-pay

## 2022-03-05 ENCOUNTER — Other Ambulatory Visit: Payer: Self-pay | Admitting: *Deleted

## 2022-03-05 ENCOUNTER — Inpatient Hospital Stay: Payer: BC Managed Care – PPO | Attending: Obstetrics and Gynecology

## 2022-03-05 ENCOUNTER — Inpatient Hospital Stay (HOSPITAL_BASED_OUTPATIENT_CLINIC_OR_DEPARTMENT_OTHER): Payer: BC Managed Care – PPO | Admitting: Oncology

## 2022-03-05 ENCOUNTER — Inpatient Hospital Stay: Payer: BC Managed Care – PPO

## 2022-03-05 ENCOUNTER — Ambulatory Visit: Payer: BC Managed Care – PPO

## 2022-03-05 VITALS — HR 84

## 2022-03-05 VITALS — BP 128/97 | HR 121 | Temp 99.2°F | Ht 65.0 in | Wt 223.7 lb

## 2022-03-05 DIAGNOSIS — C541 Malignant neoplasm of endometrium: Secondary | ICD-10-CM

## 2022-03-05 DIAGNOSIS — Z79899 Other long term (current) drug therapy: Secondary | ICD-10-CM | POA: Diagnosis not present

## 2022-03-05 DIAGNOSIS — Z5112 Encounter for antineoplastic immunotherapy: Secondary | ICD-10-CM | POA: Diagnosis not present

## 2022-03-05 DIAGNOSIS — Z86711 Personal history of pulmonary embolism: Secondary | ICD-10-CM | POA: Insufficient documentation

## 2022-03-05 DIAGNOSIS — D500 Iron deficiency anemia secondary to blood loss (chronic): Secondary | ICD-10-CM | POA: Diagnosis not present

## 2022-03-05 DIAGNOSIS — Z5111 Encounter for antineoplastic chemotherapy: Secondary | ICD-10-CM | POA: Insufficient documentation

## 2022-03-05 DIAGNOSIS — K219 Gastro-esophageal reflux disease without esophagitis: Secondary | ICD-10-CM | POA: Insufficient documentation

## 2022-03-05 DIAGNOSIS — D709 Neutropenia, unspecified: Secondary | ICD-10-CM | POA: Insufficient documentation

## 2022-03-05 DIAGNOSIS — K5900 Constipation, unspecified: Secondary | ICD-10-CM | POA: Diagnosis not present

## 2022-03-05 DIAGNOSIS — E876 Hypokalemia: Secondary | ICD-10-CM | POA: Insufficient documentation

## 2022-03-05 DIAGNOSIS — Z975 Presence of (intrauterine) contraceptive device: Secondary | ICD-10-CM | POA: Insufficient documentation

## 2022-03-05 DIAGNOSIS — Z7901 Long term (current) use of anticoagulants: Secondary | ICD-10-CM | POA: Diagnosis not present

## 2022-03-05 LAB — CBC WITH DIFFERENTIAL/PLATELET
Abs Immature Granulocytes: 0.05 10*3/uL (ref 0.00–0.07)
Basophils Absolute: 0 10*3/uL (ref 0.0–0.1)
Basophils Relative: 1 %
Eosinophils Absolute: 0 10*3/uL (ref 0.0–0.5)
Eosinophils Relative: 1 %
HCT: 30 % — ABNORMAL LOW (ref 36.0–46.0)
Hemoglobin: 10.5 g/dL — ABNORMAL LOW (ref 12.0–15.0)
Immature Granulocytes: 1 %
Lymphocytes Relative: 37 %
Lymphs Abs: 2.3 10*3/uL (ref 0.7–4.0)
MCH: 23.9 pg — ABNORMAL LOW (ref 26.0–34.0)
MCHC: 35 g/dL (ref 30.0–36.0)
MCV: 68.3 fL — ABNORMAL LOW (ref 80.0–100.0)
Monocytes Absolute: 0.4 10*3/uL (ref 0.1–1.0)
Monocytes Relative: 7 %
Neutro Abs: 3.3 10*3/uL (ref 1.7–7.7)
Neutrophils Relative %: 53 %
Platelets: 384 10*3/uL (ref 150–400)
RBC: 4.39 MIL/uL (ref 3.87–5.11)
RDW: 24.5 % — ABNORMAL HIGH (ref 11.5–15.5)
WBC: 6.1 10*3/uL (ref 4.0–10.5)
nRBC: 0 % (ref 0.0–0.2)

## 2022-03-05 LAB — COMPREHENSIVE METABOLIC PANEL
ALT: 14 U/L (ref 0–44)
AST: 22 U/L (ref 15–41)
Albumin: 3.2 g/dL — ABNORMAL LOW (ref 3.5–5.0)
Alkaline Phosphatase: 70 U/L (ref 38–126)
Anion gap: 17 — ABNORMAL HIGH (ref 5–15)
BUN: <5 mg/dL — ABNORMAL LOW (ref 6–20)
CO2: 19 mmol/L — ABNORMAL LOW (ref 22–32)
Calcium: 9.4 mg/dL (ref 8.9–10.3)
Chloride: 102 mmol/L (ref 98–111)
Creatinine, Ser: 0.82 mg/dL (ref 0.44–1.00)
GFR, Estimated: >60 mL/min (ref >60)
Glucose, Bld: 79 mg/dL (ref 70–99)
Potassium: 3.1 mmol/L — ABNORMAL LOW (ref 3.5–5.1)
Sodium: 138 mmol/L (ref 135–145)
Total Bilirubin: 1.1 mg/dL (ref 0.3–1.2)
Total Protein: 8.1 g/dL (ref 6.5–8.1)

## 2022-03-05 MED ORDER — FAMOTIDINE IN NACL 20-0.9 MG/50ML-% IV SOLN
20.0000 mg | Freq: Once | INTRAVENOUS | Status: AC
  Administered 2022-03-05: 20 mg via INTRAVENOUS
  Filled 2022-03-05: qty 50

## 2022-03-05 MED ORDER — POTASSIUM CHLORIDE CRYS ER 20 MEQ PO TBCR: 20.0000 meq | EXTENDED_RELEASE_TABLET | Freq: Every day | ORAL | 0 refills | Status: DC

## 2022-03-05 MED ORDER — SODIUM CHLORIDE 0.9 % IV SOLN
150.0000 mg | Freq: Once | INTRAVENOUS | Status: AC
  Administered 2022-03-05: 150 mg via INTRAVENOUS
  Filled 2022-03-05: qty 150

## 2022-03-05 MED ORDER — SODIUM CHLORIDE 0.9 % IV SOLN
Freq: Once | INTRAVENOUS | Status: AC
  Filled 2022-03-05: qty 250

## 2022-03-05 MED ORDER — PALONOSETRON HCL INJECTION 0.25 MG/5ML
0.2500 mg | Freq: Once | INTRAVENOUS | Status: AC
  Administered 2022-03-05: 0.25 mg via INTRAVENOUS
  Filled 2022-03-05: qty 5

## 2022-03-05 MED ORDER — DIPHENHYDRAMINE HCL 50 MG/ML IJ SOLN
50.0000 mg | Freq: Once | INTRAMUSCULAR | Status: DC
  Filled 2022-03-05: qty 1

## 2022-03-05 MED ORDER — SODIUM CHLORIDE 0.9 % IV SOLN
10.0000 mg | Freq: Once | INTRAVENOUS | Status: AC
  Administered 2022-03-05: 10 mg via INTRAVENOUS
  Filled 2022-03-05: qty 10

## 2022-03-05 MED ORDER — SODIUM CHLORIDE 0.9 % IV SOLN
750.0000 mg | Freq: Once | INTRAVENOUS | Status: AC
  Administered 2022-03-05: 750 mg via INTRAVENOUS
  Filled 2022-03-05: qty 75

## 2022-03-05 MED ORDER — PEG 3350-KCL-NA BICARB-NACL 420 G PO SOLR: ORAL | 0 refills | Status: DC

## 2022-03-05 MED ORDER — SODIUM CHLORIDE 0.9 % IV SOLN
200.0000 mg | Freq: Once | INTRAVENOUS | Status: AC
  Administered 2022-03-05: 200 mg via INTRAVENOUS
  Filled 2022-03-05: qty 8

## 2022-03-05 MED ORDER — DIPHENHYDRAMINE HCL 50 MG/ML IJ SOLN
25.0000 mg | Freq: Once | INTRAMUSCULAR | Status: AC
  Administered 2022-03-05: 25 mg via INTRAVENOUS

## 2022-03-05 MED ORDER — SODIUM CHLORIDE 0.9 % IV SOLN
175.0000 mg/m2 | Freq: Once | INTRAVENOUS | Status: AC
  Administered 2022-03-05: 402 mg via INTRAVENOUS
  Filled 2022-03-05: qty 67

## 2022-03-05 MED ORDER — LINACLOTIDE 290 MCG PO CAPS: 290.0000 ug | ORAL_CAPSULE | Freq: Every day | ORAL | 11 refills | Status: DC

## 2022-03-05 MED ORDER — HEPARIN SOD (PORK) LOCK FLUSH 100 UNIT/ML IV SOLN
500.0000 [IU] | Freq: Once | INTRAVENOUS | Status: AC | PRN
  Administered 2022-03-05: 500 [IU]
  Filled 2022-03-05: qty 5

## 2022-03-05 MED ORDER — POTASSIUM CHLORIDE IN NACL 20-0.9 MEQ/L-% IV SOLN
Freq: Once | INTRAVENOUS | Status: AC
  Filled 2022-03-05: qty 1000

## 2022-03-05 NOTE — Patient Instructions (Signed)
[facility]  Discharge Instructions: Thank you for choosing [facility] to provide your oncology and hematology care.  If you have a lab appointment with the Cancer Center, please go directly to the Cancer Center and check in at the registration area.  Wear comfortable clothing and clothing appropriate for easy access to any Portacath or PICC line.   We strive to give you quality time with your provider. You may need to reschedule your appointment if you arrive late (15 or more minutes).  Arriving late affects you and other patients whose appointments are after yours.  Also, if you miss three or more appointments without notifying the office, you may be dismissed from the clinic at the provider's discretion.      For prescription refill requests, have your pharmacy contact our office and allow 72 hours for refills to be completed.    To help prevent nausea and vomiting after your treatment, we encourage you to take your nausea medication as directed.  BELOW ARE SYMPTOMS THAT SHOULD BE REPORTED IMMEDIATELY: *FEVER GREATER THAN 100.4 F (38 C) OR HIGHER *CHILLS OR SWEATING *NAUSEA AND VOMITING THAT IS NOT CONTROLLED WITH YOUR NAUSEA MEDICATION *UNUSUAL SHORTNESS OF BREATH *UNUSUAL BRUISING OR BLEEDING *URINARY PROBLEMS (pain or burning when urinating, or frequent urination) *BOWEL PROBLEMS (unusual diarrhea, constipation, pain near the anus) TENDERNESS IN MOUTH AND THROAT WITH OR WITHOUT PRESENCE OF ULCERS (sore throat, sores in mouth, or a toothache) UNUSUAL RASH, SWELLING OR PAIN  UNUSUAL VAGINAL DISCHARGE OR ITCHING   Items with * indicate a potential emergency and should be followed up as soon as possible or go to the Emergency Department if any problems should occur.  Please show the CHEMOTHERAPY ALERT CARD or IMMUNOTHERAPY ALERT CARD at check-in to the Emergency Department and triage nurse.  Should you have questions after your visit or  need to cancel or reschedule your appointment, please contact [facility]  [phone] and follow the prompts.  Office hours are 8:00 a.m. to 4:30 p.m. Monday - Friday. Please note that voicemails left after 4:00 p.m. may not be returned until the following business day.  We are closed weekends and major holidays. You have access to a nurse at all times for urgent questions. Please call the main number to the clinic [phone] and follow the prompts.  For any non-urgent questions, you may also contact your provider using MyChart. We now offer e-Visits for anyone 18 and older to request care online for non-urgent symptoms. For details visit [email or web address].   Also download the MyChart app! Go to the app store, search "MyChart", open the app, select [facility], and log in with your MyChart username and password.    

## 2022-03-05 NOTE — Progress Notes (Signed)
Hematology/Oncology Consult note [name]  Telephone:([phone] Fax:([phone]  Patient Care Team: Center, [name] as PCP - General (General Practice) [name], RN as Oncology Nurse Navigator   Name of the patient: Jean Morris  [id number]  1975/07/20   Date of visit: 03/05/22  Diagnosis- stage IVb endometrioid endometrial cancer    Chief complaint/ Reason for visit-on treatment assessment prior to cycle 3 of CarboTaxol Keytruda chemotherapy  Heme/Onc history: Patient is a 47 year old female who has seen [name] for irregular menstrual bleeding.  He has had an IUD in place and initially it was attribute it to use of IUD and was tried to be controlled with progesterone agents.  However due to worsening abdominal pain patient underwent a CT abdomen and pelvis with contrast on 12/25/2021 which showed 8 mm   Posterior lung base mass.  Retroperitoneal adenopathy measuring 2.6 cm additional retrocrural adenopathy.  Faint lucent lesion involving L2 vertebral body.  The uterus is anteverted and enlarged.  Endometrium is enlarged heterogeneous and irregular extending to the fundal myometrium.  Findings consistent with endometrial malignancy.  CT chest without contrast showed a 10 mm right lower lobe subpleural nodule.   Patient had endometrial biopsy which was consistent FIGO grade 2 endometrioid endometrial carcinoma.  Patient was seen by GYN oncology and hopes to take her for surgery if she has good response to neoadjuvant chemotherapy.MSI unstable.  BRAF negative.  Loss of major and minor MMR proteins MLH1 and PMS2 less than 5% of tumor expression.  MLH1 hyper methylation present.  This is most likely due to somatic epigenetic modification which can be seen in about 77% of sporadic endometrial cancers.   Patient lives with her partner and has 2 adult children.  She reports significant flank pain as well as abdominal pain.  Ongoing  vaginal bleeding.  She reports significant fatigue.  She has a prior history of pulmonary embolism in 2017 that was attributed to sequels for which she took anticoagulation for 1 year and stopped.  Interval history-patient is on Protonix 40 mg twice daily but still reports heartburn.  Also reports feeling constipated and had a small bowel movement 2 days ago.  She has a constant feeling to burp all the time and states that it makes it difficult for her to eat because of burping.  ECOG PS- 1 Pain scale- 3 Opioid associated constipation- no  Review of systems- Review of Systems  Constitutional:  Positive for malaise/fatigue. Negative for chills, fever and weight loss.  HENT:  Negative for congestion, ear discharge and nosebleeds.   Eyes:  Negative for blurred vision.  Respiratory:  Negative for cough, hemoptysis, sputum production, shortness of breath and wheezing.   Cardiovascular:  Negative for chest pain, palpitations, orthopnea and claudication.  Gastrointestinal:  Positive for heartburn. Negative for abdominal pain, blood in stool, constipation, diarrhea, melena, nausea and vomiting.  Genitourinary:  Negative for dysuria, flank pain, frequency, hematuria and urgency.  Musculoskeletal:  Negative for back pain, joint pain and myalgias.  Skin:  Negative for rash.  Neurological:  Negative for dizziness, tingling, focal weakness, seizures, weakness and headaches.  Endo/Heme/Allergies:  Does not bruise/bleed easily.  Psychiatric/Behavioral:  Negative for depression and suicidal ideas. The patient does not have insomnia.       Allergies  Allergen Reactions   Bee Pollen Nausea And Vomiting    Itchy, swelling, watery eyes, runny nose.   Pollen Extract Nausea And Vomiting    Itchy,  swelling, watery eyes, runny nose.     Past Medical History:  Diagnosis Date   Anemia    Endometrial cancer ([name])    Hypertension    Menorrhagia    Pulmonary embolism [facility])      Past Surgical History:   Procedure Laterality Date   CESAREAN SECTION  1994   COLONOSCOPY WITH PROPOFOL N/A 08/06/2021   Procedure: COLONOSCOPY WITH PROPOFOL;  Surgeon: Jonathon Bellows, MD;  Location[facility] ENDOSCOPY;  Service: Gastroenterology;  Laterality: N/A;   ESOPHAGOGASTRODUODENOSCOPY (EGD) WITH PROPOFOL N/A 02/09/2020   Procedure: ESOPHAGOGASTRODUODENOSCOPY (EGD) WITH PROPOFOL;  Surgeon: Jonathon Bellows, MD;  Location[facility] ENDOSCOPY;  Service: Gastroenterology;  Laterality: N/A;   IR IMAGING GUIDED PORT INSERTION  01/17/2022    Social History   Socioeconomic History   Marital status: Soil scientist    Spouse name: Not on file   Number of children: Not on file   Years of education: Not on file   Highest education level: Not on file  Occupational History   Not on file  Tobacco Use   Smoking status: Never   Smokeless tobacco: Never  Vaping Use   Vaping Use: Never used  Substance and Sexual Activity   Alcohol use: No   Drug use: No   Sexual activity: Not Currently    Birth control/protection: I.U.D.    Comment: patient states MD said did not see in CT today 01/17/2022  Other Topics Concern   Not on file  Social History Narrative   Lives with [name], partner, and no pets.   Social Determinants of Health   Financial Resource Strain: Not on file  Food Insecurity: Not on file  Transportation Needs: Not on file  Physical Activity: Not on file  Stress: Not on file  Social Connections: Not on file  Intimate Partner Violence: Not on file    Family History  Problem Relation Age of Onset   Hypertension Mother    Stroke Mother    Heart attack Mother    Cancer Father    Hypertension Father    Cancer Sister    Cancer Sister    Cancer Brother    Hypertension Other      Current Outpatient Medications:    apixaban (ELIQUIS) 2.5 MG TABS tablet, Take 1 tablet (2.5 mg total) by mouth 2 (two) times daily., Disp: 60 tablet, Rfl: 3   dexamethasone (DECADRON) 4 MG tablet, Take 2 tablets by mouth  starting the day after chemotherapy for 3 days. Take with food., Disp: 30 tablet, Rfl: 1   ferrous sulfate 325 (65 FE) MG tablet, Take 1 tablet (325 mg total) by mouth 2 (two) times daily with a meal. (Patient taking differently: Take 325 mg by mouth every other day.), Disp: 90 tablet, Rfl: 7   lactulose (CHRONULAC) 10 GM/15ML solution, Take 15-30 mLs (10-20 g total) by mouth 2 (two) times daily as needed (constipation)., Disp: 236 mL, Rfl: 0   levonorgestrel (MIRENA) 20 MCG/24HR IUD, 1 Intra Uterine Device (1 each total) by Intrauterine route once., Disp: 1 each, Rfl: 0   lidocaine-prilocaine (EMLA) cream, Apply to affected area once, Disp: 30 g, Rfl: 3   nystatin (MYCOSTATIN/NYSTOP) powder, Apply 1 Application topically 3 (three) times daily., Disp: 30 g, Rfl: 0   oxyCODONE (OXY IR/ROXICODONE) 5 MG immediate release tablet, Take 1 tablet (5 mg total) by mouth every 4 (four) hours as needed for severe pain., Disp: 120 tablet, Rfl: 0   pantoprazole (PROTONIX) 40 MG tablet, Take 1 tablet (40  mg total) by mouth 2 (two) times daily for 14 days., Disp: 28 tablet, Rfl: 0   sucralfate (CARAFATE) 1 g tablet, Take 1 tablet (1 g total) by mouth 4 (four) times daily -  with meals and at bedtime for 7 days., Disp: 28 tablet, Rfl: 0   LORazepam (ATIVAN) 0.5 MG tablet, Take 1 tablet (0.5 mg total) by mouth every 8 (eight) hours. (Patient not taking: Reported on 03/05/2022), Disp: 30 tablet, Rfl: 0   ondansetron (ZOFRAN) 8 MG tablet, Take 1 tablet (8 mg total) by mouth every 8 (eight) hours as needed for nausea or vomiting. Start on the third day after chemotherapy. (Patient not taking: Reported on 03/05/2022), Disp: 30 tablet, Rfl: 1   prochlorperazine (COMPAZINE) 10 MG tablet, Take 1 tablet (10 mg total) by mouth every 6 (six) hours as needed for nausea or vomiting. (Patient not taking: Reported on 03/05/2022), Disp: 30 tablet, Rfl: 1  Physical exam: There were no vitals filed for this visit. Physical  Exam Cardiovascular:     Rate and Rhythm: Regular rhythm. Tachycardia present.     Heart sounds: Normal heart sounds.  Pulmonary:     Effort: Pulmonary effort is normal.     Breath sounds: Normal breath sounds.  Abdominal:     General: Bowel sounds are normal.     Palpations: Abdomen is soft.  Skin:    General: Skin is warm and dry.  Neurological:     Mental Status: She is alert and oriented to person, place, and time.         Latest Ref Rng & Units 02/16/2022   11:37 AM  CMP  Glucose 70 - 99 mg/dL 126   BUN 6 - 20 mg/dL 17   Creatinine 0.44 - 1.00 mg/dL 0.83   Sodium 135 - 145 mmol/L 137   Potassium 3.5 - 5.1 mmol/L 3.2   Chloride 98 - 111 mmol/L 105   CO2 22 - 32 mmol/L 21   Calcium 8.9 - 10.3 mg/dL 9.4       Latest Ref Rng & Units 03/05/2022    8:16 AM  CBC  WBC 4.0 - 10.5 K/uL 6.1   Hemoglobin 12.0 - 15.0 g/dL 10.5   Hematocrit 36.0 - 46.0 % 30.0   Platelets 150 - 400 K/uL 384     No images are attached to the encounter.  CT Angio Chest PE W and/or Wo Contrast  Result Date: 02/16/2022 CLINICAL DATA:  Pulmonary embolism (PE) suspected, high prob EXAM: CT ANGIOGRAPHY CHEST WITH CONTRAST TECHNIQUE: Multidetector CT imaging of the chest was performed using the standard protocol during bolus administration of intravenous contrast. Multiplanar CT image reconstructions and MIPs were obtained to evaluate the vascular anatomy. RADIATION DOSE REDUCTION: This exam was performed according to the departmental dose-optimization program which includes automated exposure control, adjustment of the mA and/or kV according to patient size and/or use of iterative reconstruction technique. CONTRAST:  75 mL Omnipaque 350non-ionic IV contrast. COMPARISON:  12/25/2021 FINDINGS: Cardiovascular: Satisfactory opacification of the pulmonary arteries to the segmental level. No evidence of pulmonary embolism. Normal heart size. No pericardial effusion. Mediastinum/Nodes: No enlarged mediastinal,  hilar, or axillary lymph nodes. Thyroid gland, trachea, and esophagus demonstrate no significant findings. Lungs/Pleura: There is an 8 mm nodule right lower lobe posteriorly which has diminished in size compared to the prior study. Lungs are otherwise clear. No pneumonia or pulmonary edema. Upper Abdomen: No acute abnormality. Musculoskeletal: No chest wall abnormality. No acute or significant osseous findings. Right-sided  Port-A-Cath tip is in the SVC. Review of the MIP images confirms the above findings. IMPRESSION: 1. No evidence of PE, aneurysm or dissection. 2. Right lower lobe nodule has decreased in size. Electronically Signed   By: Sammie Bench M.D.   On: 02/16/2022 13:30   DG Chest 2 View  Result Date: 02/16/2022 CLINICAL DATA:  SOB EXAM: CHEST - 2 VIEW COMPARISON:  February 11, 2022 FINDINGS: The cardiomediastinal silhouette is unchanged in contour. No pleural effusion. No pneumothorax. No acute pleuroparenchymal abnormality. Known RIGHT lower lobe pulmonary nodule is not visualized radiographically. Visualized abdomen is unremarkable. RIGHT chest port with tip terminating over the SVC. Degenerative changes of the RIGHT shoulder. IMPRESSION: 1. No acute cardiopulmonary abnormality. 2. Known RIGHT lower lobe pulmonary nodule is not visualized radiographically. Electronically Signed   By: Valentino Saxon M.D.   On: 02/16/2022 11:59   DG Chest 2 View  Result Date: 02/11/2022 CLINICAL DATA:  Shortness of breath EXAM: CHEST - 2 VIEW COMPARISON:  11/03/2019 FINDINGS: Lungs are clear.  No pleural effusion or pneumothorax. The heart is normal in size. Right chest power port terminates in the lower SVC. Visualized osseous structures are within normal limits. IMPRESSION: Normal chest radiographs. Electronically Signed   By: Julian Hy M.D.   On: 02/11/2022 23:16     Assessment and plan- Patient is a 47 y.o. female  with stage IV endometrioid endometrial carcinoma T2 N2 M1.  She is here for  on treatment assessment prior to cycle 3 of CarboTaxol Keytruda chemotherapy  Counts okay to proceed with CarboTaxol Keytruda chemotherapy cycle 3 today and I will see her back in 3 weeks tentatively for cycle 4.  She will be getting interim CT chest abdomen pelvis with contrast in 2 weeks time.  I will reach out to Dr. Theora Gianotti to see if she would be a surgical candidate after 3 cycles or if we are to continue with chemotherapy.  Microcytic anemia: She has received 2 doses of Feraheme in the past.  Hemoglobin remained stable around 10.  She still has microcytosis.  I will check iron studies again at next visit and if they are normal I will consider screening her for hemoglobinopathies.  Microcytosis can also be seen with anemia of chronic disease.  Constipation: I did reach out to Dr. Vicente Males who has seen her in the past and he has recommended restarting Linzess.  Unclear as to why patient stopped it.  If patient has no relief with Linzess we will give her GoLytely and also have her follow-up with Dr. Vicente Males.  Heartburn: For now she will continue with Protonix 40 mg twice daily  1 L of IV fluids today with 20 mill equivalents of IV potassium.  Patient will continue Eliquis for DVT prophylaxis   Visit Diagnosis 1. Encounter for antineoplastic chemotherapy   2. Endometrial cancer ([place])   3. Encounter for antineoplastic immunotherapy   4. Iron deficiency anemia due to chronic blood loss   5. Hypokalemia      Dr. Randa Evens, MD, MPH [facility] [phone] 03/05/2022 8:48 AM

## 2022-03-05 NOTE — Telephone Encounter (Signed)
Sherri, RN from the [employer] let patient know that she is to take GoLytely to clean out and then start taking Linzess 290 mcg 1 capsule daily. Both prescriptions were sent to her pharamacy [facility] on [address].

## 2022-03-06 NOTE — Progress Notes (Signed)
Spoke with patient about [employer] and the [employer], she will be in one day soon to sign and bring in proof of income.

## 2022-03-07 ENCOUNTER — Other Ambulatory Visit: Payer: Self-pay | Admitting: Oncology

## 2022-03-07 MED ORDER — NYSTATIN 100000 UNIT/GM EX POWD: 1.0000 | Freq: Three times a day (TID) | CUTANEOUS | 0 refills | Status: DC

## 2022-03-10 ENCOUNTER — Emergency Department
Admission: EM | Admit: 2022-03-10 | Discharge: 2022-03-10 | Disposition: A | Discharge status: Home / Self Care | Payer: BC Managed Care – PPO | Attending: Emergency Medicine | Admitting: Emergency Medicine

## 2022-03-10 ENCOUNTER — Other Ambulatory Visit: Payer: Self-pay

## 2022-03-10 ENCOUNTER — Encounter: Payer: Self-pay | Admitting: Oncology

## 2022-03-10 ENCOUNTER — Emergency Department: Payer: BC Managed Care – PPO

## 2022-03-10 DIAGNOSIS — Z8542 Personal history of malignant neoplasm of other parts of uterus: Secondary | ICD-10-CM | POA: Diagnosis not present

## 2022-03-10 DIAGNOSIS — K219 Gastro-esophageal reflux disease without esophagitis: Secondary | ICD-10-CM | POA: Diagnosis not present

## 2022-03-10 DIAGNOSIS — Z7901 Long term (current) use of anticoagulants: Secondary | ICD-10-CM | POA: Insufficient documentation

## 2022-03-10 DIAGNOSIS — R0602 Shortness of breath: Secondary | ICD-10-CM | POA: Diagnosis not present

## 2022-03-10 DIAGNOSIS — I10 Essential (primary) hypertension: Secondary | ICD-10-CM | POA: Diagnosis not present

## 2022-03-10 DIAGNOSIS — R911 Solitary pulmonary nodule: Secondary | ICD-10-CM | POA: Diagnosis not present

## 2022-03-10 LAB — COMPREHENSIVE METABOLIC PANEL
ALT: 24 U/L (ref 0–44)
AST: 35 U/L (ref 15–41)
Albumin: 3.7 g/dL (ref 3.5–5.0)
Alkaline Phosphatase: 58 U/L (ref 38–126)
Anion gap: 14 (ref 5–15)
BUN: 11 mg/dL (ref 6–20)
CO2: 23 mmol/L (ref 22–32)
Calcium: 9.6 mg/dL (ref 8.9–10.3)
Chloride: 99 mmol/L (ref 98–111)
Creatinine, Ser: 0.66 mg/dL (ref 0.44–1.00)
GFR, Estimated: >60 mL/min (ref >60)
Glucose, Bld: 100 mg/dL — ABNORMAL HIGH (ref 70–99)
Potassium: 2.7 mmol/L — CL (ref 3.5–5.1)
Sodium: 136 mmol/L (ref 135–145)
Total Bilirubin: 1.3 mg/dL — ABNORMAL HIGH (ref 0.3–1.2)
Total Protein: 8.7 g/dL — ABNORMAL HIGH (ref 6.5–8.1)

## 2022-03-10 LAB — CBC
HCT: 30.9 % — ABNORMAL LOW (ref 36.0–46.0)
Hemoglobin: 10.8 g/dL — ABNORMAL LOW (ref 12.0–15.0)
MCH: 23.8 pg — ABNORMAL LOW (ref 26.0–34.0)
MCHC: 35 g/dL (ref 30.0–36.0)
MCV: 68.1 fL — ABNORMAL LOW (ref 80.0–100.0)
Platelets: 399 10*3/uL (ref 150–400)
RBC: 4.54 MIL/uL (ref 3.87–5.11)
RDW: 24.5 % — ABNORMAL HIGH (ref 11.5–15.5)
WBC: 4 10*3/uL (ref 4.0–10.5)
nRBC: 0 % (ref 0.0–0.2)

## 2022-03-10 LAB — TROPONIN I (HIGH SENSITIVITY): Troponin I (High Sensitivity): 4 ng/L (ref <18)

## 2022-03-10 LAB — BRAIN NATRIURETIC PEPTIDE: B Natriuretic Peptide: 16.8 pg/mL (ref 0.0–100.0)

## 2022-03-10 MED ORDER — MAGNESIUM CITRATE PO SOLN: 0.5000 | Freq: Once | ORAL | 0 refills | Status: AC

## 2022-03-10 MED ORDER — SODIUM CHLORIDE 0.9 % IV BOLUS
500.0000 mL | Freq: Once | INTRAVENOUS | Status: AC
  Administered 2022-03-10: 500 mL via INTRAVENOUS

## 2022-03-10 MED ORDER — POTASSIUM CHLORIDE 10 MEQ/100ML IV SOLN
10.0000 meq | Freq: Once | INTRAVENOUS | Status: AC
  Administered 2022-03-10: 10 meq via INTRAVENOUS
  Filled 2022-03-10: qty 100

## 2022-03-10 MED ORDER — FAMOTIDINE 20 MG PO TABS
20.0000 mg | ORAL_TABLET | Freq: Once | ORAL | Status: AC
  Administered 2022-03-10: 20 mg via ORAL
  Filled 2022-03-10: qty 1

## 2022-03-10 MED ORDER — ALUM & MAG HYDROXIDE-SIMETH 200-200-20 MG/5ML PO SUSP
15.0000 mL | Freq: Once | ORAL | Status: AC
  Administered 2022-03-10: 15 mL via ORAL
  Filled 2022-03-10: qty 30

## 2022-03-10 MED ORDER — SUCRALFATE 1 G PO TABS: 1.0000 g | ORAL_TABLET | Freq: Four times a day (QID) | ORAL | 0 refills | Status: DC | PRN

## 2022-03-10 MED ORDER — LIDOCAINE-PRILOCAINE 2.5-2.5 % EX CREA: TOPICAL_CREAM | Freq: Once | CUTANEOUS | Status: DC | PRN

## 2022-03-10 NOTE — Discharge Instructions (Addendum)
Continue taking your protonix for reflux and the colace or miralax for constipation.  We have added carafate which you can take for acid reflux.  You can also take the magnesium citrate for constipation.   Return to

## 2022-03-10 NOTE — ED Notes (Signed)
IV team at the bedside. 

## 2022-03-10 NOTE — ED Triage Notes (Addendum)
Pt here with SOB since yesterday. Pt is a cancer pt and is receiving chemo. Pt states she has periods when she is walking were she cannot catch her breath and her HR increases. Pt states she was here for same recently and told that she just had gas, pt belching in triage. Pt also states her last BM was 03/07/2023.

## 2022-03-10 NOTE — ED Provider Notes (Signed)
[facility] Provider Note    Event Date/Time   First MD Initiated Contact with Patient 03/10/22 1032     (approximate)   History   Shortness of Breath   HPI  Jean Morris is a 47 y.o. female with a history of endometrial cancer, anemia, hypertension, past PE on Eliquis who presents with shortness of breath and lower chest/epigastric discomfort, acute onset earlier this morning.  The patient states that she has had this multiple times before and that this is when she has come to the ED for previously.  The most recent time she was treated with a GI cocktail and states this helped the pain.  She states that the current presentation is identical to what she was feeling then.  She denies any fever or chills, vomiting or diarrhea, significant cough, weakness or lightheadedness, or other acute symptoms.  I reviewed the past medical records.  The patient was most recently seen in the ED on 12/24 due to increased shortness of breath.  She had negative cardiac enzymes and a negative CT angio at that time.  She was treated for GERD. Her most recent outpatient encounter was with the cancer center on 1/10 and she had chemotherapy that day.  She was also restarted on Linzess at that time.  Physical Exam   Triage Vital Signs: ED Triage Vitals [03/10/22 0946]  Enc Vitals Group     BP (!) 146/85     Pulse Rate (!) 136     Resp 20     Temp 97.6 F (36.4 C)     Temp Source Oral     SpO2 100 %     Weight 223 lb 12.3 oz (101.5 kg)     Height '5\' 5"'$  (1.651 m)     Head Circumference      Peak Flow      Pain Score 3     Pain Loc      Pain Edu?      Excl. in [place]?     Most recent vital signs: Vitals:   03/10/22 1319 03/10/22 1356  BP: (!) 132/90   Pulse: (!) 102   Resp: 20   Temp:  97.8 F (36.6 C)  SpO2: 100%      General: Alert and oriented, well-appearing. CV:  Good peripheral perfusion.  Normal heart sounds. Resp:  Normal effort.  Lungs CTAB. Abd:  No  distention.  Soft and nontender. Other:  No peripheral edema.   ED Results / Procedures / Treatments   Labs (all labs ordered are listed, but only abnormal results are displayed) Labs Reviewed  CBC - Abnormal; Notable for the following components:      Result Value   Hemoglobin 10.8 (*)    HCT 30.9 (*)    MCV 68.1 (*)    MCH 23.8 (*)    RDW 24.5 (*)    All other components within normal limits  COMPREHENSIVE METABOLIC PANEL - Abnormal; Notable for the following components:   Potassium 2.7 (*)    Glucose, Bld 100 (*)    Total Protein 8.7 (*)    Total Bilirubin 1.3 (*)    All other components within normal limits  BRAIN NATRIURETIC PEPTIDE  TROPONIN I (HIGH SENSITIVITY)  TROPONIN I (HIGH SENSITIVITY)     EKG  ED ECG REPORT I, Arta Silence, the attending physician, personally viewed and interpreted this ECG.  Date: 03/10/2022 EKG Time: 0942 Rate: 140 Rhythm: Sinus tachycardia  QRS Axis: normal  Intervals: normal ST/T Wave abnormalities: Nonspecific ST abnormalities Narrative Interpretation: no evidence of acute ischemia   ED ECG REPORT I, Arta Silence, the attending physician, personally viewed and interpreted this ECG.  Date: 03/10/2022 EKG Time: 1554 Rate: 88 Rhythm: normal sinus rhythm QRS Axis: normal Intervals: normal ST/T Wave abnormalities: normal Narrative Interpretation: no evidence of acute ischemia   RADIOLOGY  Chest x-ray: I independently viewed and interpreted the images; there is no focal consolidation or edema  PROCEDURES:  Critical Care performed: No  Procedures   MEDICATIONS ORDERED IN ED: Medications  lidocaine-prilocaine (EMLA) cream (has no administration in time range)  sodium chloride 0.9 % bolus 500 mL (500 mLs Intravenous New Bag/Given 03/10/22 1313)  potassium chloride 10 mEq in 100 mL IVPB (0 mEq Intravenous Stopped 03/10/22 1446)  famotidine (PEPCID) tablet 20 mg (20 mg Oral Given 03/10/22 1212)  alum & mag  hydroxide-simeth (MAALOX/MYLANTA) 200-200-20 MG/5ML suspension 15 mL (15 mLs Oral Given 03/10/22 1212)     IMPRESSION / MDM / ASSESSMENT AND PLAN / ED COURSE  I reviewed the triage vital signs and the nursing notes.  47 year old female with PMH as noted above presents with some shortness of breath and epigastric/sternal chest area discomfort similar to prior flareups that have been treated as GERD.  Physical exam is unremarkable for acute findings.  EKG shows sinus tachycardia as high as 140 but no specific ischemic findings.  Differential diagnosis includes, but is not limited to, GERD, gastritis, ACS, dehydration, electrolyte abnormality.  Although the patient is being treated for cancer, I have a low suspicion for PE.  She is tachycardic but not hypoxic.  She does not have any significant chest pain.  She had a CT angio on 12/24 which was negative for PE, and she states that the current pain the same at as she had at that presentation.  We will obtain chest x-ray, lab workup, and reassess.  She agrees that there is no indication for repeat CT angiogram at this time.  Patient's presentation is most consistent with acute presentation with potential threat to life or bodily function.  The patient is on the cardiac monitor to evaluate for evidence of arrhythmia and/or significant heart rate changes.   ----------------------------------------- 4:02 PM on 03/10/2022 -----------------------------------------   BMP revealed hypokalemia which the patient has had previously.  She just started oral Klor-Con.  I will give a dose of 10 mg potassium chloride in the ED here.  Troponin is negative.  Given that it is already been several hours since the start of her symptoms and she has had identical pain previously, there is no indication for a repeat troponin.  BNP is also normal.  Chest x-ray shows no acute findings.  The patient states that the pain has completely resolved after the GI cocktail.  Repeat  EKG is nonischemic.  At this time, she is stable for discharge home.  She feels well and would like to go home.  I counseled her on the results of the workup and plan of care.  I recommend that she continue the potassium that she just started.  I have prescribed her Carafate for GERD.  She is already on Protonix.  She also requested some medication for constipation.  She has already tried MiraLAX, Colace, and Senokot with minimal relief so I have prescribed magnesium citrate.  I gave strict return precautions and the patient expressed understanding.   FINAL CLINICAL IMPRESSION(S) / ED DIAGNOSES   Final diagnoses:  SOB (shortness of breath)  Gastroesophageal reflux disease, unspecified whether esophagitis present     Rx / DC Orders   ED Discharge Orders          Ordered    sucralfate (CARAFATE) 1 g tablet  4 times daily PRN        03/10/22 1604    magnesium citrate SOLN   Once        03/10/22 1604             Note:  This document was prepared using Dragon voice recognition software and may include unintentional dictation errors.    Arta Silence, MD 03/10/22 1712

## 2022-03-10 NOTE — Telephone Encounter (Signed)
Patient is currently in the emergency room

## 2022-03-11 ENCOUNTER — Encounter: Payer: Self-pay | Admitting: Oncology

## 2022-03-13 ENCOUNTER — Other Ambulatory Visit: Payer: Self-pay | Admitting: *Deleted

## 2022-03-13 ENCOUNTER — Telehealth: Payer: Self-pay | Admitting: *Deleted

## 2022-03-13 MED ORDER — NYSTATIN 100000 UNIT/ML MT SUSP: 5.0000 mL | Freq: Four times a day (QID) | OROMUCOSAL | 0 refills | Status: DC

## 2022-03-13 NOTE — Telephone Encounter (Signed)
Nystatin swish and spit or swallow

## 2022-03-13 NOTE — Telephone Encounter (Signed)
Disability form corrected and faxed to [employer]. My Chart message sent to patient ti inform her that t is done

## 2022-03-17 ENCOUNTER — Ambulatory Visit
Admission: RE | Admit: 2022-03-17 | Discharge: 2022-03-17 | Disposition: A | Discharge status: Home / Self Care | Payer: BC Managed Care – PPO | Source: Ambulatory Visit | Attending: Oncology | Admitting: Oncology

## 2022-03-17 ENCOUNTER — Encounter: Payer: Self-pay | Admitting: Oncology

## 2022-03-17 DIAGNOSIS — R590 Localized enlarged lymph nodes: Secondary | ICD-10-CM | POA: Diagnosis not present

## 2022-03-17 DIAGNOSIS — Z5111 Encounter for antineoplastic chemotherapy: Secondary | ICD-10-CM | POA: Insufficient documentation

## 2022-03-17 DIAGNOSIS — J984 Other disorders of lung: Secondary | ICD-10-CM | POA: Diagnosis not present

## 2022-03-17 DIAGNOSIS — K760 Fatty (change of) liver, not elsewhere classified: Secondary | ICD-10-CM | POA: Diagnosis not present

## 2022-03-17 DIAGNOSIS — R911 Solitary pulmonary nodule: Secondary | ICD-10-CM | POA: Diagnosis not present

## 2022-03-17 DIAGNOSIS — C541 Malignant neoplasm of endometrium: Secondary | ICD-10-CM | POA: Diagnosis not present

## 2022-03-17 DIAGNOSIS — N839 Noninflammatory disorder of ovary, fallopian tube and broad ligament, unspecified: Secondary | ICD-10-CM | POA: Diagnosis not present

## 2022-03-17 MED ORDER — IOHEXOL 300 MG/ML  SOLN
100.0000 mL | Freq: Once | INTRAMUSCULAR | Status: AC | PRN
  Administered 2022-03-17: 100 mL via INTRAVENOUS

## 2022-03-18 ENCOUNTER — Other Ambulatory Visit: Payer: Self-pay | Admitting: *Deleted

## 2022-03-18 ENCOUNTER — Encounter: Payer: BC Managed Care – PPO | Admitting: Obstetrics and Gynecology

## 2022-03-18 ENCOUNTER — Telehealth: Payer: Self-pay | Admitting: *Deleted

## 2022-03-18 DIAGNOSIS — E876 Hypokalemia: Secondary | ICD-10-CM

## 2022-03-18 NOTE — Telephone Encounter (Signed)
Please schedule for tomorrow - port flush encounter will need to be scheduled at 745- see Lauren at 8 am and fluid clinic at [phone]

## 2022-03-18 NOTE — Telephone Encounter (Signed)
Patient called stating she is not feeling well and thinks it has to do with her potassium level and the fact that it was low and she cannot take the Klor Con She feels she needs IV fluids and is asking iof there is a different potassium that can be ordered for her. Please advise.  Her last documented K+ level was 2.7 on 03/10/22

## 2022-03-19 ENCOUNTER — Inpatient Hospital Stay: Payer: BC Managed Care – PPO

## 2022-03-19 ENCOUNTER — Inpatient Hospital Stay (HOSPITAL_BASED_OUTPATIENT_CLINIC_OR_DEPARTMENT_OTHER): Payer: BC Managed Care – PPO | Admitting: Nurse Practitioner

## 2022-03-19 ENCOUNTER — Encounter: Payer: Self-pay | Admitting: Nurse Practitioner

## 2022-03-19 DIAGNOSIS — K219 Gastro-esophageal reflux disease without esophagitis: Secondary | ICD-10-CM | POA: Diagnosis not present

## 2022-03-19 DIAGNOSIS — C541 Malignant neoplasm of endometrium: Secondary | ICD-10-CM | POA: Diagnosis not present

## 2022-03-19 DIAGNOSIS — Z5111 Encounter for antineoplastic chemotherapy: Secondary | ICD-10-CM

## 2022-03-19 DIAGNOSIS — K5900 Constipation, unspecified: Secondary | ICD-10-CM | POA: Diagnosis not present

## 2022-03-19 DIAGNOSIS — Z86711 Personal history of pulmonary embolism: Secondary | ICD-10-CM | POA: Diagnosis not present

## 2022-03-19 DIAGNOSIS — D500 Iron deficiency anemia secondary to blood loss (chronic): Secondary | ICD-10-CM | POA: Diagnosis not present

## 2022-03-19 DIAGNOSIS — E876 Hypokalemia: Secondary | ICD-10-CM

## 2022-03-19 DIAGNOSIS — Z975 Presence of (intrauterine) contraceptive device: Secondary | ICD-10-CM | POA: Diagnosis not present

## 2022-03-19 DIAGNOSIS — Z5112 Encounter for antineoplastic immunotherapy: Secondary | ICD-10-CM | POA: Diagnosis not present

## 2022-03-19 DIAGNOSIS — Z7901 Long term (current) use of anticoagulants: Secondary | ICD-10-CM | POA: Diagnosis not present

## 2022-03-19 DIAGNOSIS — Z79899 Other long term (current) drug therapy: Secondary | ICD-10-CM | POA: Diagnosis not present

## 2022-03-19 DIAGNOSIS — D709 Neutropenia, unspecified: Secondary | ICD-10-CM | POA: Diagnosis not present

## 2022-03-19 LAB — CBC WITH DIFFERENTIAL/PLATELET
Abs Immature Granulocytes: 0.01 10*3/uL (ref 0.00–0.07)
Basophils Absolute: 0 10*3/uL (ref 0.0–0.1)
Basophils Relative: 1 %
Eosinophils Absolute: 0 10*3/uL (ref 0.0–0.5)
Eosinophils Relative: 1 %
HCT: 30.1 % — ABNORMAL LOW (ref 36.0–46.0)
Hemoglobin: 10.3 g/dL — ABNORMAL LOW (ref 12.0–15.0)
Immature Granulocytes: 1 %
Lymphocytes Relative: 77 %
Lymphs Abs: 1.7 10*3/uL (ref 0.7–4.0)
MCH: 24.6 pg — ABNORMAL LOW (ref 26.0–34.0)
MCHC: 34.2 g/dL (ref 30.0–36.0)
MCV: 71.8 fL — ABNORMAL LOW (ref 80.0–100.0)
Monocytes Absolute: 0.3 10*3/uL (ref 0.1–1.0)
Monocytes Relative: 13 %
Neutro Abs: 0.2 10*3/uL — CL (ref 1.7–7.7)
Neutrophils Relative %: 7 %
Platelets: 209 10*3/uL (ref 150–400)
RBC: 4.19 MIL/uL (ref 3.87–5.11)
RDW: 26.2 % — ABNORMAL HIGH (ref 11.5–15.5)
WBC: 2.2 10*3/uL — ABNORMAL LOW (ref 4.0–10.5)
nRBC: 0 % (ref 0.0–0.2)

## 2022-03-19 LAB — COMPREHENSIVE METABOLIC PANEL
ALT: 33 U/L (ref 0–44)
AST: 34 U/L (ref 15–41)
Albumin: 3.5 g/dL (ref 3.5–5.0)
Alkaline Phosphatase: 58 U/L (ref 38–126)
Anion gap: 16 — ABNORMAL HIGH (ref 5–15)
BUN: 8 mg/dL (ref 6–20)
CO2: 22 mmol/L (ref 22–32)
Calcium: 9.6 mg/dL (ref 8.9–10.3)
Chloride: 100 mmol/L (ref 98–111)
Creatinine, Ser: 0.69 mg/dL (ref 0.44–1.00)
GFR, Estimated: >60 mL/min (ref >60)
Glucose, Bld: 79 mg/dL (ref 70–99)
Potassium: 3 mmol/L — ABNORMAL LOW (ref 3.5–5.1)
Sodium: 138 mmol/L (ref 135–145)
Total Bilirubin: 0.6 mg/dL (ref 0.3–1.2)
Total Protein: 8 g/dL (ref 6.5–8.1)

## 2022-03-19 LAB — FERRITIN: Ferritin: 479 ng/mL — ABNORMAL HIGH (ref 11–307)

## 2022-03-19 LAB — IRON AND TIBC
Iron: 68 ug/dL (ref 28–170)
Saturation Ratios: 36 % — ABNORMAL HIGH (ref 10.4–31.8)
TIBC: 188 ug/dL — ABNORMAL LOW (ref 250–450)
UIBC: 120 ug/dL

## 2022-03-19 LAB — VITAMIN B12: Vitamin B-12: 763 pg/mL (ref 180–914)

## 2022-03-19 LAB — MAGNESIUM: Magnesium: 1.8 mg/dL (ref 1.7–2.4)

## 2022-03-19 MED ORDER — POTASSIUM CHLORIDE 20 MEQ/100ML IV SOLN
20.0000 meq | Freq: Once | INTRAVENOUS | Status: AC
  Administered 2022-03-19: 20 meq via INTRAVENOUS

## 2022-03-19 MED ORDER — PANTOPRAZOLE SODIUM 40 MG PO TBEC: 40.0000 mg | DELAYED_RELEASE_TABLET | Freq: Every day | ORAL | 2 refills | Status: DC

## 2022-03-19 MED ORDER — SENNA 8.6 MG PO TABS: 1.0000 | ORAL_TABLET | Freq: Every day | ORAL | 0 refills | Status: DC

## 2022-03-19 MED ORDER — SODIUM CHLORIDE 0.9 % IV SOLN
INTRAVENOUS | Status: DC
  Filled 2022-03-19 (×2): qty 250

## 2022-03-19 MED ORDER — LIDOCAINE-PRILOCAINE 2.5-2.5 % EX CREA: TOPICAL_CREAM | CUTANEOUS | 3 refills | Status: DC

## 2022-03-19 MED ORDER — POTASSIUM CHLORIDE CRYS ER 20 MEQ PO TBCR: 20.0000 meq | EXTENDED_RELEASE_TABLET | Freq: Two times a day (BID) | ORAL | 1 refills | Status: DC

## 2022-03-19 MED ORDER — POTASSIUM CHLORIDE IN NACL 20-0.9 MEQ/L-% IV SOLN
Freq: Once | INTRAVENOUS | Status: DC
  Filled 2022-03-19: qty 1000

## 2022-03-19 MED ORDER — POLYETHYLENE GLYCOL 3350 17 G PO PACK: 17.0000 g | PACK | Freq: Every day | ORAL | 3 refills | Status: AC

## 2022-03-19 MED ORDER — HEPARIN SOD (PORK) LOCK FLUSH 100 UNIT/ML IV SOLN
500.0000 [IU] | Freq: Once | INTRAVENOUS | Status: AC
  Administered 2022-03-19: 500 [IU] via INTRAVENOUS
  Filled 2022-03-19: qty 5

## 2022-03-19 NOTE — Progress Notes (Signed)
Sympt[facility] [place] [facility] [place]. [facility] [address], [place], [place] [address] [phone] (phone) [phone] (fax)  Patient Care Team: Center, [facility] as PCP - General (General Practice) [name], RN as Oncology Nurse Navigator   Name of the patient: Jean Morris  [id number]  [birth year]   Date of visit: 03/19/22  Diagnosis- Endometrial Cancer  Chief complaint/ Reason for visit- Constipation, Acid Reflux, Low Potassium  Heme/Onc history:  Oncology History  Endometrial cancer ([place])  01/15/2022 Initial Diagnosis   Endometrial cancer ([place])   01/15/2022 Cancer Staging   Staging form: Corpus Uteri - Carcinoma and Carcinosarcoma, AJCC 8th Edition - Clinical stage from 01/15/2022: FIGO Stage IVB (cT2, cN2, cM1) - Signed by Sindy Guadeloupe, MD on 01/15/2022 Stage prefix: Initial diagnosis   01/21/2022 -  Chemotherapy   Patient is on Treatment Plan : endometrial Carboplatin + Paclitaxel keytruda q21d       Interval history- Patient is 47 year old female diagnosed with endometrial cancer, most recently taking carbo-paclitaxel-keytruda, who presents to [facility] for acute complaints of constipation, low potassium, and confusion about her medications. She has not been taking miralax, senna, lactulose, or golytely consistently. Questions taking medications concurrently for possible interactions. Was confused about what she was supposed and not supposed to be taking. Takes protonix every few days when she has acid reflux. Denies nausea or vomiting. Rarely has abdominal pain and has not required oxycodone. Has not moved her bowels normally. Last BM was 2 days ago and was a hard ball. Was told to stop suppositories. Takes oral iron every couple of days. Has not been able to get linzess. Has not been eating well d/t constipation. Continues to  be unsure about what she should be eating and primarily eats [place] and grilled chicken. Her husband questions if she is having surgery or will be taking more chemotherapy.   ECOG FS:2 - Symptomatic, <50% confined to bed  Review of systems- Review of Systems  Constitutional:  Positive for malaise/fatigue and weight loss. Negative for chills and fever.  HENT:  Negative for hearing loss, nosebleeds, sore throat and tinnitus.   Eyes:  Negative for blurred vision and double vision.  Respiratory:  Negative for cough, hemoptysis, shortness of breath and wheezing.   Cardiovascular:  Negative for chest pain, palpitations and leg swelling.  Gastrointestinal:  Positive for constipation. Negative for abdominal pain, blood in stool, diarrhea, melena, nausea and vomiting.  Genitourinary:  Negative for dysuria and urgency.  Musculoskeletal:  Negative for back pain, falls, joint pain and myalgias.  Skin:  Negative for itching and rash.  Neurological:  Negative for dizziness, tingling, sensory change, loss of consciousness, weakness and headaches.  Endo/Heme/Allergies:  Negative for environmental allergies. Does not bruise/bleed easily.  Psychiatric/Behavioral:  Negative for depression. The patient is nervous/anxious. The patient does not have insomnia.      Allergies  Allergen Reactions   Bee Pollen Nausea And Vomiting    Itchy, swelling, watery eyes, runny nose.   Pollen Extract Nausea And Vomiting    Itchy, swelling, watery eyes, runny nose.    Past Medical History:  Diagnosis Date   Anemia    Endometrial cancer ([place])    Hypertension    Menorrhagia    Pulmonary embolism ([place])     Past Surgical History:  Procedure Laterality Date   CESAREAN SECTION  1994   COLONOSCOPY WITH  PROPOFOL N/A 08/06/2021   Procedure: COLONOSCOPY WITH PROPOFOL;  Surgeon: Jonathon Bellows, MD;  Location: [facility] ENDOSCOPY;  Service: Gastroenterology;  Laterality: N/A;   ESOPHAGOGASTRODUODENOSCOPY (EGD) WITH PROPOFOL N/A  02/09/2020   Procedure: ESOPHAGOGASTRODUODENOSCOPY (EGD) WITH PROPOFOL;  Surgeon: Jonathon Bellows, MD;  Location: [facility] ENDOSCOPY;  Service: Gastroenterology;  Laterality: N/A;   IR IMAGING GUIDED PORT INSERTION  01/17/2022    Social History   Socioeconomic History   Marital status: Soil scientist    Spouse name: Not on file   Number of children: Not on file   Years of education: Not on file   Highest education level: Not on file  Occupational History   Not on file  Tobacco Use   Smoking status: Never   Smokeless tobacco: Never  Vaping Use   Vaping Use: Never used  Substance and Sexual Activity   Alcohol use: No   Drug use: No   Sexual activity: Not Currently    Birth control/protection: I.U.D.    Comment: patient states MD said did not see in CT today 01/17/2022  Other Topics Concern   Not on file  Social History Narrative   Lives with [facility], partner, and no pets.   Social Determinants of Health   Financial Resource Strain: Not on file  Food Insecurity: Not on file  Transportation Needs: Not on file  Physical Activity: Not on file  Stress: Not on file  Social Connections: Not on file  Intimate Partner Violence: Not on file    Family History  Problem Relation Age of Onset   Hypertension Mother    Stroke Mother    Heart attack Mother    Cancer Father    Hypertension Father    Cancer Sister    Cancer Sister    Cancer Brother    Hypertension Other      Current Outpatient Medications:    apixaban (ELIQUIS) 2.5 MG TABS tablet, Take 1 tablet (2.5 mg total) by mouth 2 (two) times daily., Disp: 60 tablet, Rfl: 3   dexamethasone (DECADRON) 4 MG tablet, Take 2 tablets by mouth starting the day after chemotherapy for 3 days. Take with food., Disp: 30 tablet, Rfl: 1   ferrous sulfate 325 (65 FE) MG tablet, Take 1 tablet (325 mg total) by mouth 2 (two) times daily with a meal. (Patient taking differently: Take 325 mg by mouth every other day.), Disp: 90 tablet, Rfl: 7    lactulose (CHRONULAC) 10 GM/15ML solution, Take 15-30 mLs (10-20 g total) by mouth 2 (two) times daily as needed (constipation). (Patient not taking: Reported on 03/10/2022), Disp: 236 mL, Rfl: 0   levonorgestrel (MIRENA) 20 MCG/24HR IUD, 1 Intra Uterine Device (1 each total) by Intrauterine route once., Disp: 1 each, Rfl: 0   lidocaine-prilocaine (EMLA) cream, Apply to affected area once, Disp: 30 g, Rfl: 3   linaclotide (LINZESS) 290 MCG CAPS capsule, Take 1 capsule (290 mcg total) by mouth daily before breakfast., Disp: 30 capsule, Rfl: 11   LORazepam (ATIVAN) 0.5 MG tablet, Take 1 tablet (0.5 mg total) by mouth every 8 (eight) hours., Disp: 30 tablet, Rfl: 0   magnesium hydroxide (MILK OF MAGNESIA) 400 MG/5ML suspension, Take 15 mLs by mouth daily as needed for mild constipation., Disp: , Rfl:    nystatin (MYCOSTATIN) 100000 UNIT/ML suspension, Take 5 mLs (500,000 Units total) by mouth 4 (four) times daily., Disp: 120 mL, Rfl: 0   nystatin (MYCOSTATIN/NYSTOP) powder, Apply 1 Application topically 3 (three) times daily., Disp: 30 g, Rfl: 0  ondansetron (ZOFRAN) 8 MG tablet, Take 1 tablet (8 mg total) by mouth every 8 (eight) hours as needed for nausea or vomiting. Start on the third day after chemotherapy., Disp: 30 tablet, Rfl: 1   oxyCODONE (OXY IR/ROXICODONE) 5 MG immediate release tablet, Take 1 tablet (5 mg total) by mouth every 4 (four) hours as needed for severe pain., Disp: 120 tablet, Rfl: 0   pantoprazole (PROTONIX) 40 MG tablet, Take 1 tablet (40 mg total) by mouth 2 (two) times daily for 14 days., Disp: 28 tablet, Rfl: 0   polyethylene glycol-electrolytes (NULYTELY) 420 g solution, Drink one 8 oz glass of mixture every 15 minutes until you finish the jug., Disp: 4000 mL, Rfl: 0   potassium chloride SA (KLOR-CON M) 20 MEQ tablet, Take 1 tablet (20 mEq total) by mouth daily., Disp: 21 tablet, Rfl: 0   prochlorperazine (COMPAZINE) 10 MG tablet, Take 1 tablet (10 mg total) by mouth every 6  (six) hours as needed for nausea or vomiting. (Patient not taking: Reported on 03/05/2022), Disp: 30 tablet, Rfl: 1   sucralfate (CARAFATE) 1 g tablet, Take 1 tablet (1 g total) by mouth 4 (four) times daily as needed., Disp: 60 tablet, Rfl: 0  Physical exam: There were no vitals filed for this visit. Physical Exam Constitutional:      General: She is not in acute distress.    Appearance: She is well-developed. She is not ill-appearing.     Comments: In wheelchair. Accompanied by Delroy  HENT:     Head: Atraumatic.     Mouth/Throat:     Pharynx: No oropharyngeal exudate.  Eyes:     General: No scleral icterus.    Conjunctiva/sclera: Conjunctivae normal.  Cardiovascular:     Rate and Rhythm: Normal rate and regular rhythm.  Pulmonary:     Effort: Pulmonary effort is normal.     Breath sounds: Normal breath sounds.  Abdominal:     General: Bowel sounds are normal. There is no distension.     Palpations: Abdomen is soft.     Tenderness: There is no abdominal tenderness.  Musculoskeletal:        General: No tenderness or deformity. Normal range of motion.     Cervical back: Normal range of motion and neck supple.  Skin:    General: Skin is warm and dry.     Coloration: Skin is not pale.  Neurological:     General: No focal deficit present.     Mental Status: She is alert and oriented to person, place, and time.  Psychiatric:        Mood and Affect: Mood normal.        Behavior: Behavior normal.         Latest Ref Rng & Units 03/19/2022    7:43 AM  CMP  Glucose 70 - 99 mg/dL 79   BUN 6 - 20 mg/dL 8   Creatinine 0.44 - 1.00 mg/dL 0.69   Sodium 135 - 145 mmol/L 138   Potassium 3.5 - 5.1 mmol/L 3.0   Chloride 98 - 111 mmol/L 100   CO2 22 - 32 mmol/L 22   Calcium 8.9 - 10.3 mg/dL 9.6   Total Protein 6.5 - 8.1 g/dL 8.0   Total Bilirubin 0.3 - 1.2 mg/dL 0.6   Alkaline Phos 38 - 126 U/L 58   AST 15 - 41 U/L 34   ALT 0 - 44 U/L 33       Latest Ref Rng & Units  03/19/2022     7:43 AM  CBC  WBC 4.0 - 10.5 K/uL 2.2   Hemoglobin 12.0 - 15.0 g/dL 10.3   Hematocrit 36.0 - 46.0 % 30.1   Platelets 150 - 400 K/uL 209    Iron/TIBC/Ferritin/ %Sat    Component Value Date/Time   IRON 14 (L) 01/15/2022 1035   IRON 25 (L) 11/19/2016 1053   IRON 14 (L) 08/22/2012 2206   TIBC 252 01/15/2022 1035   TIBC 247 (L) 11/19/2016 1053   TIBC 341 08/22/2012 2206   FERRITIN 60 01/15/2022 1035   FERRITIN 5 (L) 08/22/2012 2206   IRONPCTSAT 6 (L) 01/15/2022 1035   IRONPCTSAT 10 (L) 11/19/2016 1053   IRONPCTSAT 4 08/22/2012 2206   No images are attached to the encounter.  CT CHEST ABDOMEN PELVIS W CONTRAST  Result Date: 03/17/2022 CLINICAL DATA:  History of endometrial cancer, follow-up. * Tracking Code: BO * EXAM: CT CHEST, ABDOMEN, AND PELVIS WITH CONTRAST TECHNIQUE: Multidetector CT imaging of the chest, abdomen and pelvis was performed following the standard protocol during bolus administration of intravenous contrast. RADIATION DOSE REDUCTION: This exam was performed according to the departmental dose-optimization program which includes automated exposure control, adjustment of the mA and/or kV according to patient size and/or use of iterative reconstruction technique. CONTRAST:  186m OMNIPAQUE IOHEXOL 300 MG/ML  SOLN COMPARISON:  Multiple priors including CT December 25, 2021. FINDINGS: CT CHEST FINDINGS Cardiovascular: Right chest Port-A-Cath with tip at the superior cavoatrial junction. Normal caliber thoracic aorta. No central pulmonary embolus on this nondedicated study. Normal size heart. No significant pericardial effusion/thickening. Mediastinum/Nodes: No supraclavicular adenopathy. No suspicious thyroid nodule. No pathologically enlarged mediastinal, hilar or axillary lymph nodes. Lungs/Pleura: Continued decrease in size of the posterior right lower lobe pulmonary nodule which now measures 4 mm on image 101/3 previously 8 mm. No new suspicious pulmonary nodules or masses. Mild  diffuse bronchial wall thickening with mosaic attenuation of the lungs. No pleural effusion. No pneumothorax. Similar linear band of scarring along the left major fissure on image 94/3 Musculoskeletal: Multilevel degenerative changes spine. Degenerative change of the bilateral shoulders. CT ABDOMEN PELVIS FINDINGS Hepatobiliary: Diffuse hepatic steatosis. No suspicious hepatic lesion. Gallbladder is unremarkable. No biliary ductal dilation. Pancreas: No pancreatic ductal dilation or evidence of acute inflammation Spleen: No splenomegaly. Adrenals/Urinary Tract: Bilateral adrenal glands appear normal. No hydronephrosis. Kidneys demonstrate symmetric enhancement. Urinary bladder is unremarkable for degree of distension Stomach/Bowel: Stomach is unremarkable for degree of distension. No pathologic dilation of small or large bowel. No evidence of acute bowel inflammation Vascular/Lymphatic: Normal caliber abdominal aorta. Smooth IVC contours. Portal, splenic and superior mesenteric veins are patent. Overall decreased retrocrural and retroperitoneal adenopathy. For reference: -retrocrural lymph node measures 4 mm in short axis on image 44/2 previously 16 mm in short axis. -aortocaval lymph node measures 5 mm in short axis on image 75/2 previously 2.2 cm. -left periaortic lymph node at the level of the renal hilum measures 7 mm in short axis on image 64/2 previously 18 mm. Centrally necrotic left periaortic lymph node/lymph node conglomerate with adjacent infiltrative/desmoplastic stranding measures 3.8 x 3.6 cm on image 71/2 previously 2.8 x 4.4 cm. Pathologically enlarged pelvic lymph nodes. Reproductive: Persistent heterogeneous irregularity of the endometrium with uterine enlargement compatible with known endometrial malignancy. New heterogeneity of the 2.8 cm left ovarian lesion on image 97/2 Other: No significant abdominopelvic free fluid. No discrete peritoneal or omental nodularity. Musculoskeletal: New pathologic  compression deformity of the L2 vertebral body. No new suspicious  osseous lesions identified. Multilevel degenerative changes spine. Degenerative change of the bilateral hips. IMPRESSION: 1. Persistent heterogeneous irregularity of the endometrium with uterine enlargement compatible with known endometrial malignancy. 2. New heterogeneity of the 2.8 cm left ovarian lesion, nonspecific but possibly reflecting metastatic disease. Consider further evaluation with pelvic ultrasound. 3. Overall decreased retrocrural and retroperitoneal adenopathy. However, there is interval increase in size and adjacent infiltrative/desmoplastic stranding adjacent to a centrally necrotic left periaortic lymph node/lymph node conglomerate. 4. Continued decrease in size of the posterior right lower lobe pulmonary nodule. 5. New pathologic compression deformity of the L2 vertebral body. 6. Diffuse hepatic steatosis. 7. Mild diffuse bronchial wall thickening with mosaic attenuation of the lungs, suggestive of small airways disease. Electronically Signed   By: Dahlia Bailiff M.D.   On: 03/17/2022 10:21   DG Chest 2 View  Result Date: 03/10/2022 CLINICAL DATA:  Shortness of breath. EXAM: CHEST - 2 VIEW COMPARISON:  None Available. FINDINGS: The heart size and mediastinal contours are within normal limits. Similar position right IJ Port-A-Cath with the tip at the superior cavoatrial junction. Both lungs are clear. Known right lower lobe pulmonary nodules not visible radiographically. No visible pleural effusions or pneumothorax. No acute osseous abnormality. IMPRESSION: No active cardiopulmonary disease. Electronically Signed   By: Margaretha Sheffield M.D.   On: 03/10/2022 10:29    Assessment and plan- Patient is a 47 y.o. female who presents to [facility] for acute complaints of   Constipation- Extensive discussion, including the need to not fall behind on constipation management and the invariable need for regular  stimulant laxatives. I recommended taking senna and miralax daily. If no BM in 2 days, as she is currently experiencing, take milk of magnesia. I suspect her constipation would have resolved if she had taken golytely. Will reach out to Ulice Dash to see about patient assistance for linzess.  Endometrial cancer- s/p 3 cycles of carboplatin. Paclitaxel and pembrolizumab added for cycles 2 and 3. CT C/A/P on 03/17/22 for restaging with Dr. Janese Banks was reviewed. I have forwarded results to Dr Theora Gianotti for review. Unclear at this point if gyn onc will recommend interval debulking or additional chemotherapy. I will update Dr. Janese Banks when I hear back from Dr. Theora Gianotti.  Neutropenia- ANC 0.2. afebrile. Reviewed neutropenic precautions. Hold gcsf per Dr. Janese Banks.  Anemia- s/p feraheme x 2. hmg stable at 10.3. Iron studies pending. No obvious blood loss.  Hypokalemia- K 3.0. Improved. Has not been able to tolerate potassium orally. Plan for 20 meq of KCl IV today. We reviewed that she will take 20 meq of oral potassium daily. She can break tablets in half or dissolve in water or orange juice to help improve palatability.  Iron Deficiency- iron studies pending. I've asked her to stop oral iron d/t risk of gi upset and constipation as well as increased pill burden and confusion. We can consider IV iron if needed.  Shortness of breath- suspect anxiety component as well as deconditioning and anemia. May benefit from adding an SSRI but for now, continue ativan which she takes infrequently.  Med management- extensive discussion regarding use of medications and teaching. Provided patient with updated med list. Can also consider assisting with pill boxes if needed.  Weight loss- related to severely restrictive diet. Encouraged liberalization of food choices and reviewed role of fiber with resolution of constipation. We discussed the role of insoluble fiber in preventing constipation. Insoluble fiber doesn't change when it gets wet or  doesn't become 'sticky' like  soluble fiber does. Foods such as fruits and vegetable are high in insoluble fiber and can help prevent constipation. We discussed that soluble fiber sources can also help prevent constipation. Will also refer to [facility] for education and monitoring of weights and trends.   Disposition:  Potassium replacement today Ref to [facility] RTC as scheduled- la   Visit Diagnosis 1. Hypokalemia   2. Constipation, unspecified constipation type   3. Gastroesophageal reflux disease, unspecified whether esophagitis present   4. Endometrial cancer [facility])    Patient expressed understanding and was in agreement with this plan. She also understands that She can call clinic at any time with any questions, concerns, or complaints.   Thank you for allowing me to participate in the care of this very pleasant patient.   Beckey Rutter, DNP, AGNP-C, [place] [facility] [place]

## 2022-03-19 NOTE — Progress Notes (Signed)
Spoke with patient about applying for the [employer] and [employer], she said she would stop by one day soon to sign and bring in proof of income.

## 2022-03-19 NOTE — Patient Instructions (Signed)
It is common for patients who are undergoing treatment and taking certain prescribed medications to experience side-effects with constipation.  If you experience constipation, please take stool softeners such as Senna and/or Miralax every day to avoid constipation.  These medications are available over the counter.  Of course, if you have diarrhea, stop taking stool softeners.  Drinking plenty of fluid, eating fruits and vegetable, and being active also reduces the risk of constipation.   If despite taking stool softeners, and you still have no bowel movement for 2 days or more than your normal bowel habit frequency, please take one of the following over the counter laxatives:  Milk of Magnesia or Mag Citrate everyday and contact me immediately for further instructions.  The goal is to have at least one bowel movement every day or every other day without pain or straining.

## 2022-03-19 NOTE — Patient Instructions (Signed)
Hypokalemia Hypokalemia means that the amount of potassium in the blood is lower than normal. Potassium is a mineral (electrolyte) that helps regulate the amount of fluid in the body. It also stimulates muscle tightening (contraction) and helps nerves work properly. Normally, most of the body's potassium is inside cells, and only a very small amount is in the blood. Because the amount in the blood is so small, minor changes to potassium levels in the blood can be life-threatening. What are the causes? This condition may be caused by: Antibiotic medicine. Diarrhea or vomiting. Taking too much of a medicine that helps you have a bowel movement (laxative) can cause diarrhea and lead to hypokalemia. Chronic kidney disease (CKD). Medicines that help the body get rid of excess fluid (diuretics). Eating disorders, such as anorexia or bulimia. Low magnesium levels in the body. Sweating a lot. What are the signs or symptoms? Symptoms of this condition include: Weakness. Constipation. Fatigue. Muscle cramps. Mental confusion. Skipped heartbeats or irregular heartbeat (palpitations). Tingling or numbness. How is this diagnosed? This condition is diagnosed with a blood test. How is this treated? This condition may be treated by: Taking potassium supplements. Adjusting the medicines that you take. Eating more foods that contain a lot of potassium. If your potassium level is very low, you may need to get potassium through an IV and be monitored in the hospital. Follow these instructions at home: Eating and drinking  Eat a healthy diet. A healthy diet includes fresh fruits and vegetables, whole grains, healthy fats, and lean proteins. If told, eat more foods that contain a lot of potassium. These include: Nuts, such as peanuts and pistachios. Seeds, such as sunflower seeds and pumpkin seeds. Peas, lentils, and lima beans. Whole grain and bran cereals and breads. Fresh fruits and vegetables,  such as apricots, avocado, bananas, cantaloupe, kiwi, oranges, tomatoes, asparagus, and potatoes. Juices, such as orange, tomato, and prune. Lean meats, including fish. Milk and milk products, such as yogurt. General instructions Take over-the-counter and prescription medicines only as told by your health care provider. This includes vitamins, natural food products, and supplements. Keep all follow-up visits. This is important. Contact a health care provider if: You have weakness that gets worse. You feel your heart pounding or racing. You vomit. You have diarrhea. You have diabetes and you have trouble keeping your blood sugar in your target range. Get help right away if: You have chest pain. You have shortness of breath. You have vomiting or diarrhea that lasts for more than 2 days. You faint. These symptoms may be an emergency. Get help right away. Call 911. Do not wait to see if the symptoms will go away. Do not drive yourself to the hospital. Summary Hypokalemia means that the amount of potassium in the blood is lower than normal. This condition is diagnosed with a blood test. Hypokalemia may be treated by taking potassium supplements, adjusting the medicines that you take, or eating more foods that are high in potassium. If your potassium level is very low, you may need to get potassium through an IV and be monitored in the hospital. This information is not intended to replace advice given to you by your health care provider. Make sure you discuss any questions you have with your health care provider. Document Revised: 10/25/2020 Document Reviewed: 10/25/2020 Elsevier Patient Education  [place].

## 2022-03-25 MED FILL — Dexamethasone Sodium Phosphate Inj 100 MG/10ML: INTRAMUSCULAR | Qty: 1 | Status: AC

## 2022-03-25 MED FILL — Fosaprepitant Dimeglumine For IV Infusion 150 MG (Base Eq): INTRAVENOUS | Qty: 5 | Status: AC

## 2022-03-26 ENCOUNTER — Encounter: Payer: Self-pay | Admitting: Oncology

## 2022-03-26 ENCOUNTER — Inpatient Hospital Stay: Payer: BC Managed Care – PPO

## 2022-03-26 ENCOUNTER — Inpatient Hospital Stay (HOSPITAL_BASED_OUTPATIENT_CLINIC_OR_DEPARTMENT_OTHER): Payer: BC Managed Care – PPO | Admitting: Oncology

## 2022-03-26 VITALS — BP 123/90 | HR 125 | Resp 18 | Ht 65.0 in | Wt 217.0 lb

## 2022-03-26 DIAGNOSIS — D500 Iron deficiency anemia secondary to blood loss (chronic): Secondary | ICD-10-CM | POA: Diagnosis not present

## 2022-03-26 DIAGNOSIS — C541 Malignant neoplasm of endometrium: Secondary | ICD-10-CM

## 2022-03-26 DIAGNOSIS — Z86711 Personal history of pulmonary embolism: Secondary | ICD-10-CM | POA: Diagnosis not present

## 2022-03-26 DIAGNOSIS — Z79899 Other long term (current) drug therapy: Secondary | ICD-10-CM | POA: Diagnosis not present

## 2022-03-26 DIAGNOSIS — D709 Neutropenia, unspecified: Secondary | ICD-10-CM | POA: Diagnosis not present

## 2022-03-26 DIAGNOSIS — Z7189 Other specified counseling: Secondary | ICD-10-CM | POA: Diagnosis not present

## 2022-03-26 DIAGNOSIS — E876 Hypokalemia: Secondary | ICD-10-CM

## 2022-03-26 DIAGNOSIS — K219 Gastro-esophageal reflux disease without esophagitis: Secondary | ICD-10-CM | POA: Diagnosis not present

## 2022-03-26 DIAGNOSIS — Z5112 Encounter for antineoplastic immunotherapy: Secondary | ICD-10-CM | POA: Diagnosis not present

## 2022-03-26 DIAGNOSIS — Z5111 Encounter for antineoplastic chemotherapy: Secondary | ICD-10-CM | POA: Diagnosis not present

## 2022-03-26 DIAGNOSIS — K5900 Constipation, unspecified: Secondary | ICD-10-CM | POA: Diagnosis not present

## 2022-03-26 DIAGNOSIS — Z7901 Long term (current) use of anticoagulants: Secondary | ICD-10-CM | POA: Diagnosis not present

## 2022-03-26 DIAGNOSIS — Z975 Presence of (intrauterine) contraceptive device: Secondary | ICD-10-CM | POA: Diagnosis not present

## 2022-03-26 LAB — COMPREHENSIVE METABOLIC PANEL
ALT: 21 U/L (ref 0–44)
AST: 25 U/L (ref 15–41)
Albumin: 3.7 g/dL (ref 3.5–5.0)
Alkaline Phosphatase: 62 U/L (ref 38–126)
Anion gap: 14 (ref 5–15)
BUN: <5 mg/dL — ABNORMAL LOW (ref 6–20)
CO2: 22 mmol/L (ref 22–32)
Calcium: 9.4 mg/dL (ref 8.9–10.3)
Chloride: 101 mmol/L (ref 98–111)
Creatinine, Ser: 0.65 mg/dL (ref 0.44–1.00)
GFR, Estimated: >60 mL/min (ref >60)
Glucose, Bld: 90 mg/dL (ref 70–99)
Potassium: 3 mmol/L — ABNORMAL LOW (ref 3.5–5.1)
Sodium: 137 mmol/L (ref 135–145)
Total Bilirubin: 1.2 mg/dL (ref 0.3–1.2)
Total Protein: 8 g/dL (ref 6.5–8.1)

## 2022-03-26 LAB — CBC WITH DIFFERENTIAL/PLATELET
Abs Immature Granulocytes: 0.06 10*3/uL (ref 0.00–0.07)
Basophils Absolute: 0 10*3/uL (ref 0.0–0.1)
Basophils Relative: 0 %
Eosinophils Absolute: 0 10*3/uL (ref 0.0–0.5)
Eosinophils Relative: 0 %
HCT: 32.3 % — ABNORMAL LOW (ref 36.0–46.0)
Hemoglobin: 11.2 g/dL — ABNORMAL LOW (ref 12.0–15.0)
Immature Granulocytes: 1 %
Lymphocytes Relative: 45 %
Lymphs Abs: 2.3 10*3/uL (ref 0.7–4.0)
MCH: 24.9 pg — ABNORMAL LOW (ref 26.0–34.0)
MCHC: 34.7 g/dL (ref 30.0–36.0)
MCV: 71.9 fL — ABNORMAL LOW (ref 80.0–100.0)
Monocytes Absolute: 0.3 10*3/uL (ref 0.1–1.0)
Monocytes Relative: 6 %
Neutro Abs: 2.5 10*3/uL (ref 1.7–7.7)
Neutrophils Relative %: 48 %
Platelets: 234 10*3/uL (ref 150–400)
RBC: 4.49 MIL/uL (ref 3.87–5.11)
RDW: 26.6 % — ABNORMAL HIGH (ref 11.5–15.5)
WBC: 5.2 10*3/uL (ref 4.0–10.5)
nRBC: 0 % (ref 0.0–0.2)

## 2022-03-26 MED ORDER — SODIUM CHLORIDE 0.9 % IV SOLN
750.0000 mg | Freq: Once | INTRAVENOUS | Status: AC
  Administered 2022-03-26: 750 mg via INTRAVENOUS
  Filled 2022-03-26: qty 75

## 2022-03-26 MED ORDER — POTASSIUM CHLORIDE IN NACL 20-0.9 MEQ/L-% IV SOLN
Freq: Once | INTRAVENOUS | Status: AC
  Filled 2022-03-26: qty 1000

## 2022-03-26 MED ORDER — POTASSIUM CHLORIDE 20 MEQ/100ML IV SOLN: 20.0000 meq | Freq: Once | INTRAVENOUS | Status: DC

## 2022-03-26 MED ORDER — SODIUM CHLORIDE 0.9 % IV SOLN
10.0000 mg | Freq: Once | INTRAVENOUS | Status: AC
  Administered 2022-03-26: 10 mg via INTRAVENOUS
  Filled 2022-03-26: qty 10

## 2022-03-26 MED ORDER — PALONOSETRON HCL INJECTION 0.25 MG/5ML
0.2500 mg | Freq: Once | INTRAVENOUS | Status: AC
  Administered 2022-03-26: 0.25 mg via INTRAVENOUS
  Filled 2022-03-26: qty 5

## 2022-03-26 MED ORDER — DIPHENHYDRAMINE HCL 50 MG/ML IJ SOLN
25.0000 mg | Freq: Once | INTRAMUSCULAR | Status: AC
  Administered 2022-03-26: 25 mg via INTRAVENOUS
  Filled 2022-03-26: qty 1

## 2022-03-26 MED ORDER — SODIUM CHLORIDE 0.9 % IV SOLN
175.0000 mg/m2 | Freq: Once | INTRAVENOUS | Status: AC
  Administered 2022-03-26: 402 mg via INTRAVENOUS
  Filled 2022-03-26: qty 67

## 2022-03-26 MED ORDER — HEPARIN SOD (PORK) LOCK FLUSH 100 UNIT/ML IV SOLN
500.0000 [IU] | Freq: Once | INTRAVENOUS | Status: AC | PRN
  Administered 2022-03-26: 500 [IU]
  Filled 2022-03-26: qty 5

## 2022-03-26 MED ORDER — SODIUM CHLORIDE 0.9 % IV SOLN
INTRAVENOUS | Status: DC
  Filled 2022-03-26: qty 250

## 2022-03-26 MED ORDER — FAMOTIDINE IN NACL 20-0.9 MG/50ML-% IV SOLN
20.0000 mg | Freq: Once | INTRAVENOUS | Status: AC
  Administered 2022-03-26: 20 mg via INTRAVENOUS
  Filled 2022-03-26: qty 50

## 2022-03-26 MED ORDER — SODIUM CHLORIDE 0.9 % IV SOLN
150.0000 mg | Freq: Once | INTRAVENOUS | Status: AC
  Administered 2022-03-26: 150 mg via INTRAVENOUS
  Filled 2022-03-26: qty 150

## 2022-03-26 MED ORDER — SODIUM CHLORIDE 0.9 % IV SOLN
200.0000 mg | Freq: Once | INTRAVENOUS | Status: AC
  Administered 2022-03-26: 200 mg via INTRAVENOUS
  Filled 2022-03-26: qty 8

## 2022-03-26 MED ORDER — SODIUM CHLORIDE 0.9 % IV SOLN
Freq: Once | INTRAVENOUS | Status: AC
  Filled 2022-03-26: qty 250

## 2022-03-26 NOTE — Patient Instructions (Signed)
[facility]  Discharge Instructions: Thank you for choosing [facility] to provide your oncology and hematology care.  If you have a lab appointment with the [facility], please go directly to the [facility] and check in at the registration area.  Wear comfortable clothing and clothing appropriate for easy access to any Portacath or PICC line.   We strive to give you quality time with your provider. You may need to reschedule your appointment if you arrive late (15 or more minutes).  Arriving late affects you and other patients whose appointments are after yours.  Also, if you miss three or more appointments without notifying the office, you may be dismissed from the clinic at the provider's discretion.      For prescription refill requests, have your pharmacy contact our office and allow 72 hours for refills to be completed.    Today you received the following chemotherapy and/or immunotherapy agents KEYTRUDA, TAXOL and CARBOPLATIN      To help prevent nausea and vomiting after your treatment, we encourage you to take your nausea medication as directed.  BELOW ARE SYMPTOMS THAT SHOULD BE REPORTED IMMEDIATELY: *FEVER GREATER THAN 100.4 F (38 C) OR HIGHER *CHILLS OR SWEATING *NAUSEA AND VOMITING THAT IS NOT CONTROLLED WITH YOUR NAUSEA MEDICATION *UNUSUAL SHORTNESS OF BREATH *UNUSUAL BRUISING OR BLEEDING *URINARY PROBLEMS (pain or burning when urinating, or frequent urination) *BOWEL PROBLEMS (unusual diarrhea, constipation, pain near the anus) TENDERNESS IN MOUTH AND THROAT WITH OR WITHOUT PRESENCE OF ULCERS (sore throat, sores in mouth, or a toothache) UNUSUAL RASH, SWELLING OR PAIN  UNUSUAL VAGINAL DISCHARGE OR ITCHING   Items with * indicate a potential emergency and should be followed up as soon as possible or go to the Emergency Department if any problems should occur.  Please show the CHEMOTHERAPY ALERT CARD or IMMUNOTHERAPY  ALERT CARD at check-in to the Emergency Department and triage nurse.  Should you have questions after your visit or need to cancel or reschedule your appointment, please contact [facility]  [phone] and follow the prompts.  Office hours are 8:00 a.m. to 4:30 p.m. Monday - Friday. Please note that voicemails left after 4:00 p.m. may not be returned until the following business day.  We are closed weekends and major holidays. You have access to a nurse at all times for urgent questions. Please call the main number to the clinic [phone] and follow the prompts.  For any non-urgent questions, you may also contact your provider using MyChart. We now offer e-Visits for anyone 65 and older to request care online for non-urgent symptoms. For details visit [email or web address].   Also download the MyChart app! Go to the app store, search "MyChart", open the app, select [facility], and log in with your MyChart username and password.   Pembrolizumab Injection What is this medication? PEMBROLIZUMAB (PEM broe LIZ ue mab) treats some types of cancer. It works by helping your immune system slow or stop the spread of cancer cells. It is a monoclonal antibody. This medicine may be used for other purposes; ask your health care provider or pharmacist if you have questions. COMMON BRAND NAME(S): Keytruda What should I tell my care team before I take this medication? They need to know if you have any of these conditions: Allogeneic stem cell transplant (uses someone else's stem cells) Autoimmune diseases, such as Crohn disease, ulcerative colitis, lupus History of chest radiation Nervous system problems, such as  Guillain-Barre syndrome, myasthenia gravis Organ transplant An unusual or allergic reaction to pembrolizumab, other medications, foods, dyes, or preservatives Pregnant or trying to get pregnant Breast-feeding How should I use this medication? This medication  is injected into a vein. It is given by your care team in a hospital or clinic setting. A special MedGuide will be given to you before each treatment. Be sure to read this information carefully each time. Talk to your care team about the use of this medication in children. While it may be prescribed for children as young as 6 months for selected conditions, precautions do apply. Overdosage: If you think you have taken too much of this medicine contact a poison control center or emergency room at once. NOTE: This medicine is only for you. Do not share this medicine with others. What if I miss a dose? Keep appointments for follow-up doses. It is important not to miss your dose. Call your care team if you are unable to keep an appointment. What may interact with this medication? Interactions have not been studied. This list may not describe all possible interactions. Give your health care provider a list of all the medicines, herbs, non-prescription drugs, or dietary supplements you use. Also tell them if you smoke, drink alcohol, or use illegal drugs. Some items may interact with your medicine. What should I watch for while using this medication? Your condition will be monitored carefully while you are receiving this medication. You may need blood work while taking this medication. This medication may cause serious skin reactions. They can happen weeks to months after starting the medication. Contact your care team right away if you notice fevers or flu-like symptoms with a rash. The rash may be red or purple and then turn into blisters or peeling of the skin. You may also notice a red rash with swelling of the face, lips, or lymph nodes in your neck or under your arms. Tell your care team right away if you have any change in your eyesight. Talk to your care team if you may be pregnant. Serious birth defects can occur if you take this medication during pregnancy and for 4 months after the last dose. You  will need a negative pregnancy test before starting this medication. Contraception is recommended while taking this medication and for 4 months after the last dose. Your care team can help you find the option that works for you. Do not breastfeed while taking this medication and for 4 months after the last dose. What side effects may I notice from receiving this medication? Side effects that you should report to your care team as soon as possible: Allergic reactions--skin rash, itching, hives, swelling of the face, lips, tongue, or throat Dry cough, shortness of breath or trouble breathing Eye pain, redness, irritation, or discharge with blurry or decreased vision Heart muscle inflammation--unusual weakness or fatigue, shortness of breath, chest pain, fast or irregular heartbeat, dizziness, swelling of the ankles, feet, or hands Hormone gland problems--headache, sensitivity to light, unusual weakness or fatigue, dizziness, fast or irregular heartbeat, increased sensitivity to cold or heat, excessive sweating, constipation, hair loss, increased thirst or amount of urine, tremors or shaking, irritability Infusion reactions--chest pain, shortness of breath or trouble breathing, feeling faint or lightheaded Kidney injury (glomerulonephritis)--decrease in the amount of urine, red or dark brown urine, foamy or bubbly urine, swelling of the ankles, hands, or feet Liver injury--right upper belly pain, loss of appetite, nausea, light-colored stool, dark yellow or brown urine, yellowing skin  or eyes, unusual weakness or fatigue Pain, tingling, or numbness in the hands or feet, muscle weakness, change in vision, confusion or trouble speaking, loss of balance or coordination, trouble walking, seizures Rash, fever, and swollen lymph nodes Redness, blistering, peeling, or loosening of the skin, including inside the mouth Sudden or severe stomach pain, bloody diarrhea, fever, nausea, vomiting Side effects that  usually do not require medical attention (report to your care team if they continue or are bothersome): Bone, joint, or muscle pain Diarrhea Fatigue Loss of appetite Nausea Skin rash This list may not describe all possible side effects. Call your doctor for medical advice about side effects. You may report side effects to FDA at 1-800-FDA-1088. Where should I keep my medication? This medication is given in a hospital or clinic. It will not be stored at home. NOTE: This sheet is a summary. It may not cover all possible information. If you have questions about this medicine, talk to your doctor, pharmacist, or health care provider.  2023 Elsevier/Gold Standard (2012-11-01 00:00:00)  Paclitaxel Injection What is this medication? PACLITAXEL (PAK li TAX el) treats some types of cancer. It works by slowing down the growth of cancer cells. This medicine may be used for other purposes; ask your health care provider or pharmacist if you have questions. COMMON BRAND NAME(S): Onxol, Taxol What should I tell my care team before I take this medication? They need to know if you have any of these conditions: Heart disease Liver disease Low white blood cell levels An unusual or allergic reaction to paclitaxel, other medications, foods, dyes, or preservatives If you or your partner are pregnant or trying to get pregnant Breast-feeding How should I use this medication? This medication is injected into a vein. It is given by your care team in a hospital or clinic setting. Talk to your care team about the use of this medication in children. While it may be given to children for selected conditions, precautions do apply. Overdosage: If you think you have taken too much of this medicine contact a poison control center or emergency room at once. NOTE: This medicine is only for you. Do not share this medicine with others. What if I miss a dose? Keep appointments for follow-up doses. It is important not to  miss your dose. Call your care team if you are unable to keep an appointment. What may interact with this medication? Do not take this medication with any of the following: Live virus vaccines Other medications may affect the way this medication works. Talk with your care team about all of the medications you take. They may suggest changes to your treatment plan to lower the risk of side effects and to make sure your medications work as intended. This list may not describe all possible interactions. Give your health care provider a list of all the medicines, herbs, non-prescription drugs, or dietary supplements you use. Also tell them if you smoke, drink alcohol, or use illegal drugs. Some items may interact with your medicine. What should I watch for while using this medication? Your condition will be monitored carefully while you are receiving this medication. You may need blood work while taking this medication. This medication may make you feel generally unwell. This is not uncommon as chemotherapy can affect healthy cells as well as cancer cells. Report any side effects. Continue your course of treatment even though you feel ill unless your care team tells you to stop. This medication can cause serious allergic reactions. To  reduce the risk, your care team may give you other medications to take before receiving this one. Be sure to follow the directions from your care team. This medication may increase your risk of getting an infection. Call your care team for advice if you get a fever, chills, sore throat, or other symptoms of a cold or flu. Do not treat yourself. Try to avoid being around people who are sick. This medication may increase your risk to bruise or bleed. Call your care team if you notice any unusual bleeding. Be careful brushing or flossing your teeth or using a toothpick because you may get an infection or bleed more easily. If you have any dental work done, tell your dentist you are  receiving this medication. Talk to your care team if you may be pregnant. Serious birth defects can occur if you take this medication during pregnancy. Talk to your care team before breastfeeding. Changes to your treatment plan may be needed. What side effects may I notice from receiving this medication? Side effects that you should report to your care team as soon as possible: Allergic reactions--skin rash, itching, hives, swelling of the face, lips, tongue, or throat Heart rhythm changes--fast or irregular heartbeat, dizziness, feeling faint or lightheaded, chest pain, trouble breathing Increase in blood pressure Infection--fever, chills, cough, sore throat, wounds that don't heal, pain or trouble when passing urine, general feeling of discomfort or being unwell Low blood pressure--dizziness, feeling faint or lightheaded, blurry vision Low red blood cell level--unusual weakness or fatigue, dizziness, headache, trouble breathing Painful swelling, warmth, or redness of the skin, blisters or sores at the infusion site Pain, tingling, or numbness in the hands or feet Slow heartbeat--dizziness, feeling faint or lightheaded, confusion, trouble breathing, unusual weakness or fatigue Unusual bruising or bleeding Side effects that usually do not require medical attention (report to your care team if they continue or are bothersome): Diarrhea Hair loss Joint pain Loss of appetite Muscle pain Nausea Vomiting This list may not describe all possible side effects. Call your doctor for medical advice about side effects. You may report side effects to FDA at 1-800-FDA-1088. Where should I keep my medication? This medication is given in a hospital or clinic. It will not be stored at home. NOTE: This sheet is a summary. It may not cover all possible information. If you have questions about this medicine, talk to your doctor, pharmacist, or health care provider.  2023 Elsevier/Gold Standard (2021-06-12  00:00:00)  Carboplatin Injection What is this medication? CARBOPLATIN (KAR boe pla tin) treats some types of cancer. It works by slowing down the growth of cancer cells. This medicine may be used for other purposes; ask your health care provider or pharmacist if you have questions. COMMON BRAND NAME(S): Paraplatin What should I tell my care team before I take this medication? They need to know if you have any of these conditions: Blood disorders Hearing problems Kidney disease Recent or ongoing radiation therapy An unusual or allergic reaction to carboplatin, cisplatin, other medications, foods, dyes, or preservatives Pregnant or trying to get pregnant Breast-feeding How should I use this medication? This medication is injected into a vein. It is given by your care team in a hospital or clinic setting. Talk to your care team about the use of this medication in children. Special care may be needed. Overdosage: If you think you have taken too much of this medicine contact a poison control center or emergency room at once. NOTE: This medicine is only for  you. Do not share this medicine with others. What if I miss a dose? Keep appointments for follow-up doses. It is important not to miss your dose. Call your care team if you are unable to keep an appointment. What may interact with this medication? Medications for seizures Some antibiotics, such as amikacin, gentamicin, neomycin, streptomycin, tobramycin Vaccines This list may not describe all possible interactions. Give your health care provider a list of all the medicines, herbs, non-prescription drugs, or dietary supplements you use. Also tell them if you smoke, drink alcohol, or use illegal drugs. Some items may interact with your medicine. What should I watch for while using this medication? Your condition will be monitored carefully while you are receiving this medication. You may need blood work while taking this medication. This  medication may make you feel generally unwell. This is not uncommon, as chemotherapy can affect healthy cells as well as cancer cells. Report any side effects. Continue your course of treatment even though you feel ill unless your care team tells you to stop. In some cases, you may be given additional medications to help with side effects. Follow all directions for their use. This medication may increase your risk of getting an infection. Call your care team for advice if you get a fever, chills, sore throat, or other symptoms of a cold or flu. Do not treat yourself. Try to avoid being around people who are sick. Avoid taking medications that contain aspirin, acetaminophen, ibuprofen, naproxen, or ketoprofen unless instructed by your care team. These medications may hide a fever. Be careful brushing or flossing your teeth or using a toothpick because you may get an infection or bleed more easily. If you have any dental work done, tell your dentist you are receiving this medication. Talk to your care team if you wish to become pregnant or think you might be pregnant. This medication can cause serious birth defects. Talk to your care team about effective forms of contraception. Do not breast-feed while taking this medication. What side effects may I notice from receiving this medication? Side effects that you should report to your care team as soon as possible: Allergic reactions--skin rash, itching, hives, swelling of the face, lips, tongue, or throat Infection--fever, chills, cough, sore throat, wounds that don't heal, pain or trouble when passing urine, general feeling of discomfort or being unwell Low red blood cell level--unusual weakness or fatigue, dizziness, headache, trouble breathing Pain, tingling, or numbness in the hands or feet, muscle weakness, change in vision, confusion or trouble speaking, loss of balance or coordination, trouble walking, seizures Unusual bruising or bleeding Side  effects that usually do not require medical attention (report to your care team if they continue or are bothersome): Hair loss Nausea Unusual weakness or fatigue Vomiting This list may not describe all possible side effects. Call your doctor for medical advice about side effects. You may report side effects to FDA at 1-800-FDA-1088. Where should I keep my medication? This medication is given in a hospital or clinic. It will not be stored at home. NOTE: This sheet is a summary. It may not cover all possible information. If you have questions about this medicine, talk to your doctor, pharmacist, or health care provider.  2023 Elsevier/Gold Standard (2021-05-27 00:00:00)

## 2022-03-26 NOTE — Progress Notes (Signed)
Patient reports some constipation, mild soreness in port, appetite is better, some numbness and tingling in right foot and one finger on left hand, foot is worse, comes and goes, for 1 week, some SOB on exertion

## 2022-03-26 NOTE — Progress Notes (Signed)
Hematology/Oncology Consult note [facility]  Telephone:([phone] [phone]  Patient Care Team: Center, [facility] as PCP - General (General Practice) [name], RN as Oncology Nurse Navigator   Name of the patient: Jean Morris  [id number]  07/21/1975   Date of visit: 03/26/22  Diagnosis- stage IVb endometrioid endometrial cancer    Chief complaint/ Reason for visit-on treatment assessment prior to cycle 4 of CarboTaxol Keytruda chemotherapy and discuss CT scan results  Heme/Onc history: Patient is a 47 year old female who has seen [name] for irregular menstrual bleeding.  He has had an IUD in place and initially it was attribute it to use of IUD and was tried to be controlled with progesterone agents.  However due to worsening abdominal pain patient underwent a CT abdomen and pelvis with contrast on 12/25/2021 which showed 8 mm   Posterior lung base mass.  Retroperitoneal adenopathy measuring 2.6 cm additional retrocrural adenopathy.  Faint lucent lesion involving L2 vertebral body.  The uterus is anteverted and enlarged.  Endometrium is enlarged heterogeneous and irregular extending to the fundal myometrium.  Findings consistent with endometrial malignancy.  CT chest without contrast showed a 10 mm right lower lobe subpleural nodule.   Patient had endometrial biopsy which was consistent FIGO grade 2 endometrioid endometrial carcinoma.  Patient was seen by GYN oncology and hopes to take her for surgery if she has good response to neoadjuvant chemotherapy.MSI unstable.  BRAF negative.  Loss of major and minor MMR proteins MLH1 and PMS2 less than 5% of tumor expression.  MLH1 hyper methylation present.  This is most likely due to somatic epigenetic modification which can be seen in about 77% of sporadic endometrial cancers.  Patient currently on CarboTaxol Keytruda chemotherapy  Interval history-tolerating chemotherapy  well so far.  Symptoms of burping and constipation have improved.  She has ongoing fatigue.  Reports occasional tingling in her right toe which is intermittent.  No persistent symptoms.  Denies any back pain.  ECOG PS- 2 Pain scale- 0   Review of systems- Review of Systems  Constitutional:  Positive for malaise/fatigue. Negative for chills, fever and weight loss.  HENT:  Negative for congestion, ear discharge and nosebleeds.   Eyes:  Negative for blurred vision.  Respiratory:  Negative for cough, hemoptysis, sputum production, shortness of breath and wheezing.   Cardiovascular:  Negative for chest pain, palpitations, orthopnea and claudication.  Gastrointestinal:  Negative for abdominal pain, blood in stool, constipation, diarrhea, heartburn, melena, nausea and vomiting.  Genitourinary:  Negative for dysuria, flank pain, frequency, hematuria and urgency.  Musculoskeletal:  Negative for back pain, joint pain and myalgias.  Skin:  Negative for rash.  Neurological:  Negative for dizziness, tingling, focal weakness, seizures, weakness and headaches.  Endo/Heme/Allergies:  Does not bruise/bleed easily.  Psychiatric/Behavioral:  Negative for depression and suicidal ideas. The patient does not have insomnia.       Allergies  Allergen Reactions   Bee Pollen Nausea And Vomiting    Itchy, swelling, watery eyes, runny nose.   Pollen Extract Nausea And Vomiting    Itchy, swelling, watery eyes, runny nose.     Past Medical History:  Diagnosis Date   Anemia    Endometrial cancer ([place])    Hypertension    Menorrhagia    Pulmonary embolism [facility])      Past Surgical History:  Procedure Laterality Date   CESAREAN SECTION  1994   COLONOSCOPY WITH PROPOFOL N/A 08/06/2021  Procedure: COLONOSCOPY WITH PROPOFOL;  Surgeon: Jonathon Bellows, MD;  Location: [facility] ENDOSCOPY;  Service: Gastroenterology;  Laterality: N/A;   ESOPHAGOGASTRODUODENOSCOPY (EGD) WITH PROPOFOL N/A 02/09/2020   Procedure:  ESOPHAGOGASTRODUODENOSCOPY (EGD) WITH PROPOFOL;  Surgeon: Jonathon Bellows, MD;  Location: [facility] ENDOSCOPY;  Service: Gastroenterology;  Laterality: N/A;   IR IMAGING GUIDED PORT INSERTION  01/17/2022    Social History   Socioeconomic History   Marital status: Soil scientist    Spouse name: Not on file   Number of children: Not on file   Years of education: Not on file   Highest education level: Not on file  Occupational History   Not on file  Tobacco Use   Smoking status: Never   Smokeless tobacco: Never  Vaping Use   Vaping Use: Never used  Substance and Sexual Activity   Alcohol use: No   Drug use: No   Sexual activity: Not Currently    Birth control/protection: I.U.D.    Comment: patient states MD said did not see in CT today 01/17/2022  Other Topics Concern   Not on file  Social History Narrative   Lives with [facility], partner, and no pets.   Social Determinants of Health   Financial Resource Strain: Not on file  Food Insecurity: Not on file  Transportation Needs: Not on file  Physical Activity: Not on file  Stress: Not on file  Social Connections: Not on file  Intimate Partner Violence: Not on file    Family History  Problem Relation Age of Onset   Hypertension Mother    Stroke Mother    Heart attack Mother    Cancer Father    Hypertension Father    Cancer Sister    Cancer Sister    Cancer Brother    Hypertension Other      Current Outpatient Medications:    apixaban (ELIQUIS) 2.5 MG TABS tablet, Take 1 tablet (2.5 mg total) by mouth 2 (two) times daily., Disp: 60 tablet, Rfl: 3   dexamethasone (DECADRON) 4 MG tablet, Take 2 tablets by mouth starting the day after chemotherapy for 3 days. Take with food., Disp: 30 tablet, Rfl: 1   levonorgestrel (MIRENA) 20 MCG/24HR IUD, 1 Intra Uterine Device (1 each total) by Intrauterine route once., Disp: 1 each, Rfl: 0   lidocaine-prilocaine (EMLA) cream, Apply to affected area once, Disp: 30 g, Rfl: 3   linaclotide  (LINZESS) 290 MCG CAPS capsule, Take 1 capsule (290 mcg total) by mouth daily before breakfast., Disp: 30 capsule, Rfl: 11   LORazepam (ATIVAN) 0.5 MG tablet, Take 1 tablet (0.5 mg total) by mouth every 8 (eight) hours., Disp: 30 tablet, Rfl: 0   magnesium hydroxide (MILK OF MAGNESIA) 400 MG/5ML suspension, Take 15 mLs by mouth daily as needed for moderate constipation. Take if no bowel movement for 2 days., Disp: , Rfl:    nystatin (MYCOSTATIN/NYSTOP) powder, Apply 1 Application topically 3 (three) times daily., Disp: 30 g, Rfl: 0   oxyCODONE (OXY IR/ROXICODONE) 5 MG immediate release tablet, Take 1 tablet (5 mg total) by mouth every 4 (four) hours as needed for severe pain., Disp: 120 tablet, Rfl: 0   pantoprazole (PROTONIX) 40 MG tablet, Take 1 tablet (40 mg total) by mouth daily., Disp: 30 tablet, Rfl: 2   polyethylene glycol (MIRALAX) 17 g packet, Take 17 g by mouth daily. To prevent constipation, Disp: 30 each, Rfl: 3   potassium chloride SA (KLOR-CON M) 20 MEQ tablet, Take 1 tablet (20 mEq total) by mouth 2 (  two) times daily. Dissolve tablets, Disp: 60 tablet, Rfl: 1   senna (SENOKOT) 8.6 MG TABS tablet, Take 1-2 tablets (8.6-17.2 mg total) by mouth daily. To prevent constipation, Disp: 120 tablet, Rfl: 0   ondansetron (ZOFRAN) 8 MG tablet, Take 1 tablet (8 mg total) by mouth every 8 (eight) hours as needed for nausea or vomiting. Start on the third day after chemotherapy. (Patient not taking: Reported on 03/26/2022), Disp: 30 tablet, Rfl: 1   prochlorperazine (COMPAZINE) 10 MG tablet, Take 1 tablet (10 mg total) by mouth every 6 (six) hours as needed for nausea or vomiting. (Patient not taking: Reported on 03/26/2022), Disp: 30 tablet, Rfl: 1 No current facility-administered medications for this visit.  Facility-Administered Medications Ordered in Other Visits:    0.9 %  sodium chloride infusion, , Intravenous, Continuous, Randa Evens C, MD   0.9 % NaCl with KCl 20 mEq/ L  infusion, ,  Intravenous, Once, Sindy Guadeloupe, MD   CARBOplatin (PARAPLATIN) 750 mg in sodium chloride 0.9 % 250 mL chemo infusion, 750 mg, Intravenous, Once, Sindy Guadeloupe, MD   dexamethasone (DECADRON) 10 mg in sodium chloride 0.9 % 50 mL IVPB, 10 mg, Intravenous, Once, Sindy Guadeloupe, MD   diphenhydrAMINE (BENADRYL) injection 25 mg, 25 mg, Intravenous, Once, Sindy Guadeloupe, MD   famotidine (PEPCID) IVPB 20 mg premix, 20 mg, Intravenous, Once, Sindy Guadeloupe, MD   fosaprepitant (EMEND) 150 mg in sodium chloride 0.9 % 145 mL IVPB, 150 mg, Intravenous, Once, Sindy Guadeloupe, MD   heparin lock flush 100 unit/mL, 500 Units, Intracatheter, Once PRN, Sindy Guadeloupe, MD   PACLitaxel (TAXOL) 402 mg in sodium chloride 0.9 % 500 mL chemo infusion (> '80mg'$ /m2), 175 mg/m2 (Treatment Plan Recorded), Intravenous, Once, Sindy Guadeloupe, MD   palonosetron (ALOXI) injection 0.25 mg, 0.25 mg, Intravenous, Once, Sindy Guadeloupe, MD   pembrolizumab [employer]) 200 mg in sodium chloride 0.9 % 50 mL chemo infusion, 200 mg, Intravenous, Once, Sindy Guadeloupe, MD  Physical exam:  Vitals:   03/26/22 0841 03/26/22 0846  BP:  (!) 123/90  Pulse:  (!) 125  Resp:  18  SpO2:  100%  Weight: 217 lb (98.4 kg)   Height:  '5\' 5"'$  (1.651 m)   Physical Exam Constitutional:      Comments: Sitting in a wheelchair.  Appears in no acute distress  Cardiovascular:     Rate and Rhythm: Regular rhythm. Tachycardia present.     Heart sounds: Normal heart sounds.  Pulmonary:     Effort: Pulmonary effort is normal.     Breath sounds: Normal breath sounds.  Abdominal:     General: Bowel sounds are normal.     Palpations: Abdomen is soft.  Skin:    General: Skin is warm and dry.  Neurological:     Mental Status: She is alert and oriented to person, place, and time.         Latest Ref Rng & Units 03/26/2022    8:29 AM  CMP  Glucose 70 - 99 mg/dL 90   BUN 6 - 20 mg/dL <5   Creatinine 0.44 - 1.00 mg/dL 0.65   Sodium 135 - 145 mmol/L 137    Potassium 3.5 - 5.1 mmol/L 3.0   Chloride 98 - 111 mmol/L 101   CO2 22 - 32 mmol/L 22   Calcium 8.9 - 10.3 mg/dL 9.4   Total Protein 6.5 - 8.1 g/dL 8.0   Total Bilirubin 0.3 - 1.2 mg/dL  1.2   Alkaline Phos 38 - 126 U/L 62   AST 15 - 41 U/L 25   ALT 0 - 44 U/L 21       Latest Ref Rng & Units 03/26/2022    8:29 AM  CBC  WBC 4.0 - 10.5 K/uL 5.2   Hemoglobin 12.0 - 15.0 g/dL 11.2   Hematocrit 36.0 - 46.0 % 32.3   Platelets 150 - 400 K/uL 234     No images are attached to the encounter.  CT CHEST ABDOMEN PELVIS W CONTRAST  Result Date: 03/17/2022 CLINICAL DATA:  History of endometrial cancer, follow-up. * Tracking Code: BO * EXAM: CT CHEST, ABDOMEN, AND PELVIS WITH CONTRAST TECHNIQUE: Multidetector CT imaging of the chest, abdomen and pelvis was performed following the standard protocol during bolus administration of intravenous contrast. RADIATION DOSE REDUCTION: This exam was performed according to the departmental dose-optimization program which includes automated exposure control, adjustment of the mA and/or kV according to patient size and/or use of iterative reconstruction technique. CONTRAST:  163m OMNIPAQUE IOHEXOL 300 MG/ML  SOLN COMPARISON:  Multiple priors including CT December 25, 2021. FINDINGS: CT CHEST FINDINGS Cardiovascular: Right chest Port-A-Cath with tip at the superior cavoatrial junction. Normal caliber thoracic aorta. No central pulmonary embolus on this nondedicated study. Normal size heart. No significant pericardial effusion/thickening. Mediastinum/Nodes: No supraclavicular adenopathy. No suspicious thyroid nodule. No pathologically enlarged mediastinal, hilar or axillary lymph nodes. Lungs/Pleura: Continued decrease in size of the posterior right lower lobe pulmonary nodule which now measures 4 mm on image 101/3 previously 8 mm. No new suspicious pulmonary nodules or masses. Mild diffuse bronchial wall thickening with mosaic attenuation of the lungs. No pleural  effusion. No pneumothorax. Similar linear band of scarring along the left major fissure on image 94/3 Musculoskeletal: Multilevel degenerative changes spine. Degenerative change of the bilateral shoulders. CT ABDOMEN PELVIS FINDINGS Hepatobiliary: Diffuse hepatic steatosis. No suspicious hepatic lesion. Gallbladder is unremarkable. No biliary ductal dilation. Pancreas: No pancreatic ductal dilation or evidence of acute inflammation Spleen: No splenomegaly. Adrenals/Urinary Tract: Bilateral adrenal glands appear normal. No hydronephrosis. Kidneys demonstrate symmetric enhancement. Urinary bladder is unremarkable for degree of distension Stomach/Bowel: Stomach is unremarkable for degree of distension. No pathologic dilation of small or large bowel. No evidence of acute bowel inflammation Vascular/Lymphatic: Normal caliber abdominal aorta. Smooth IVC contours. Portal, splenic and superior mesenteric veins are patent. Overall decreased retrocrural and retroperitoneal adenopathy. For reference: -retrocrural lymph node measures 4 mm in short axis on image 44/2 previously 16 mm in short axis. -aortocaval lymph node measures 5 mm in short axis on image 75/2 previously 2.2 cm. -left periaortic lymph node at the level of the renal hilum measures 7 mm in short axis on image 64/2 previously 18 mm. Centrally necrotic left periaortic lymph node/lymph node conglomerate with adjacent infiltrative/desmoplastic stranding measures 3.8 x 3.6 cm on image 71/2 previously 2.8 x 4.4 cm. Pathologically enlarged pelvic lymph nodes. Reproductive: Persistent heterogeneous irregularity of the endometrium with uterine enlargement compatible with known endometrial malignancy. New heterogeneity of the 2.8 cm left ovarian lesion on image 97/2 Other: No significant abdominopelvic free fluid. No discrete peritoneal or omental nodularity. Musculoskeletal: New pathologic compression deformity of the L2 vertebral body. No new suspicious osseous lesions  identified. Multilevel degenerative changes spine. Degenerative change of the bilateral hips. IMPRESSION: 1. Persistent heterogeneous irregularity of the endometrium with uterine enlargement compatible with known endometrial malignancy. 2. New heterogeneity of the 2.8 cm left ovarian lesion, nonspecific but possibly reflecting metastatic disease. Consider  further evaluation with pelvic ultrasound. 3. Overall decreased retrocrural and retroperitoneal adenopathy. However, there is interval increase in size and adjacent infiltrative/desmoplastic stranding adjacent to a centrally necrotic left periaortic lymph node/lymph node conglomerate. 4. Continued decrease in size of the posterior right lower lobe pulmonary nodule. 5. New pathologic compression deformity of the L2 vertebral body. 6. Diffuse hepatic steatosis. 7. Mild diffuse bronchial wall thickening with mosaic attenuation of the lungs, suggestive of small airways disease. Electronically Signed   By: Dahlia Bailiff M.D.   On: 03/17/2022 10:21   DG Chest 2 View  Result Date: 03/10/2022 CLINICAL DATA:  Shortness of breath. EXAM: CHEST - 2 VIEW COMPARISON:  None Available. FINDINGS: The heart size and mediastinal contours are within normal limits. Similar position right IJ Port-A-Cath with the tip at the superior cavoatrial junction. Both lungs are clear. Known right lower lobe pulmonary nodules not visible radiographically. No visible pleural effusions or pneumothorax. No acute osseous abnormality. IMPRESSION: No active cardiopulmonary disease. Electronically Signed   By: Margaretha Sheffield M.D.   On: 03/10/2022 10:29     Assessment and plan- Patient is a 47 y.o. female with stage IV endometrioid endometrial carcinoma T2 N2 M1.  She is here for on treatment assessment prior to cycle 4 of CarboTaxol Keytruda chemotherapy  I have reviewed CT chest abdomen and pelvis images independently and discussed findings with the patientWhich shows overall decreased  burden of retroperitoneal and retrocrural adenopathy.  Lung nodule appears decreased in size.  There are however new findings noted including an L5 pathological compression fracture heterogeneity in the left ovary with a new 2.8 cm lesion increase in size of the left periaortic lymph node overall indicating mixed response to treatment.  I will reach out to Dr. Theora Gianotti but I do not feel that she is a surgical candidate at this time.  I would favor continuing CarboTaxol Keytruda regimen for another 3 cycles and then reassessing her for response.  Patient has an L5 compression fracture but does not have any significant pain in her back.  I did discuss the role of bisphosphonates to reduce the risk of further skeletal fractures.  Discussed risks and benefits of Zometa including all but not limited to possible osteonecrosis of the jaw.  Patient understands and agrees to proceed as planned we will obtain dental clearance prior to starting Zometa.  I will also reach out to Dr. Donella Stade regarding role for palliative radiation to the L5 vertebral lesion.  Continue low-dose Eliquis for DVT prophylaxis  Hypokalemia: 1 L of IV fluids today with 20 mEq of IV potassium   Visit Diagnosis 1. Endometrial cancer ([place])   2. Encounter for antineoplastic chemotherapy   3. Goals of care, counseling/discussion   4. Hypokalemia      Dr. Randa Evens, MD, MPH [facility] [phone] 03/26/2022 9:56 AM

## 2022-03-28 ENCOUNTER — Other Ambulatory Visit: Payer: Self-pay

## 2022-03-28 LAB — T4: T4, Total: 10.4 ug/dL (ref 4.5–12.0)

## 2022-03-28 NOTE — Progress Notes (Signed)
Enrolled patient into the [employer] and the [employer].

## 2022-03-31 ENCOUNTER — Telehealth: Payer: Self-pay | Admitting: *Deleted

## 2022-03-31 NOTE — Telephone Encounter (Signed)
New FMLA form received from patient Completed and sent for physician signature

## 2022-04-01 ENCOUNTER — Inpatient Hospital Stay (HOSPITAL_BASED_OUTPATIENT_CLINIC_OR_DEPARTMENT_OTHER): Payer: BC Managed Care – PPO | Admitting: Nurse Practitioner

## 2022-04-01 ENCOUNTER — Encounter: Payer: Self-pay | Admitting: Nurse Practitioner

## 2022-04-01 ENCOUNTER — Other Ambulatory Visit: Payer: Self-pay

## 2022-04-01 ENCOUNTER — Inpatient Hospital Stay: Payer: BC Managed Care – PPO

## 2022-04-01 ENCOUNTER — Inpatient Hospital Stay: Payer: BC Managed Care – PPO | Attending: Obstetrics and Gynecology

## 2022-04-01 ENCOUNTER — Encounter: Payer: Self-pay | Admitting: Oncology

## 2022-04-01 VITALS — BP 94/71 | HR 135 | Temp 99.2°F | Wt 216.0 lb

## 2022-04-01 DIAGNOSIS — C541 Malignant neoplasm of endometrium: Secondary | ICD-10-CM

## 2022-04-01 DIAGNOSIS — G620 Drug-induced polyneuropathy: Secondary | ICD-10-CM | POA: Diagnosis not present

## 2022-04-01 DIAGNOSIS — R509 Fever, unspecified: Secondary | ICD-10-CM | POA: Insufficient documentation

## 2022-04-01 DIAGNOSIS — K760 Fatty (change of) liver, not elsewhere classified: Secondary | ICD-10-CM | POA: Diagnosis not present

## 2022-04-01 DIAGNOSIS — Z5112 Encounter for antineoplastic immunotherapy: Secondary | ICD-10-CM | POA: Diagnosis not present

## 2022-04-01 DIAGNOSIS — I2699 Other pulmonary embolism without acute cor pulmonale: Secondary | ICD-10-CM | POA: Insufficient documentation

## 2022-04-01 DIAGNOSIS — Z79899 Other long term (current) drug therapy: Secondary | ICD-10-CM | POA: Diagnosis not present

## 2022-04-01 DIAGNOSIS — Z86718 Personal history of other venous thrombosis and embolism: Secondary | ICD-10-CM | POA: Diagnosis not present

## 2022-04-01 DIAGNOSIS — L304 Erythema intertrigo: Secondary | ICD-10-CM | POA: Diagnosis not present

## 2022-04-01 DIAGNOSIS — Z95828 Presence of other vascular implants and grafts: Secondary | ICD-10-CM

## 2022-04-01 DIAGNOSIS — Z86711 Personal history of pulmonary embolism: Secondary | ICD-10-CM | POA: Insufficient documentation

## 2022-04-01 DIAGNOSIS — R590 Localized enlarged lymph nodes: Secondary | ICD-10-CM | POA: Insufficient documentation

## 2022-04-01 DIAGNOSIS — D509 Iron deficiency anemia, unspecified: Secondary | ICD-10-CM | POA: Insufficient documentation

## 2022-04-01 DIAGNOSIS — D6481 Anemia due to antineoplastic chemotherapy: Secondary | ICD-10-CM | POA: Insufficient documentation

## 2022-04-01 DIAGNOSIS — Z7901 Long term (current) use of anticoagulants: Secondary | ICD-10-CM | POA: Diagnosis not present

## 2022-04-01 DIAGNOSIS — E876 Hypokalemia: Secondary | ICD-10-CM | POA: Diagnosis not present

## 2022-04-01 DIAGNOSIS — I10 Essential (primary) hypertension: Secondary | ICD-10-CM | POA: Insufficient documentation

## 2022-04-01 DIAGNOSIS — R911 Solitary pulmonary nodule: Secondary | ICD-10-CM | POA: Diagnosis not present

## 2022-04-01 DIAGNOSIS — R059 Cough, unspecified: Secondary | ICD-10-CM | POA: Insufficient documentation

## 2022-04-01 DIAGNOSIS — J029 Acute pharyngitis, unspecified: Secondary | ICD-10-CM

## 2022-04-01 DIAGNOSIS — K5900 Constipation, unspecified: Secondary | ICD-10-CM | POA: Insufficient documentation

## 2022-04-01 DIAGNOSIS — R000 Tachycardia, unspecified: Secondary | ICD-10-CM | POA: Diagnosis not present

## 2022-04-01 DIAGNOSIS — R042 Hemoptysis: Secondary | ICD-10-CM | POA: Insufficient documentation

## 2022-04-01 DIAGNOSIS — M4856XA Collapsed vertebra, not elsewhere classified, lumbar region, initial encounter for fracture: Secondary | ICD-10-CM | POA: Diagnosis not present

## 2022-04-01 DIAGNOSIS — Z9221 Personal history of antineoplastic chemotherapy: Secondary | ICD-10-CM | POA: Insufficient documentation

## 2022-04-01 DIAGNOSIS — M549 Dorsalgia, unspecified: Secondary | ICD-10-CM | POA: Insufficient documentation

## 2022-04-01 LAB — COMPREHENSIVE METABOLIC PANEL
ALT: 32 U/L (ref 0–44)
AST: 36 U/L (ref 15–41)
Albumin: 3.5 g/dL (ref 3.5–5.0)
Alkaline Phosphatase: 51 U/L (ref 38–126)
Anion gap: 12 (ref 5–15)
BUN: 12 mg/dL (ref 6–20)
CO2: 26 mmol/L (ref 22–32)
Calcium: 9 mg/dL (ref 8.9–10.3)
Chloride: 97 mmol/L — ABNORMAL LOW (ref 98–111)
Creatinine, Ser: 0.62 mg/dL (ref 0.44–1.00)
GFR, Estimated: >60 mL/min (ref >60)
Glucose, Bld: 95 mg/dL (ref 70–99)
Potassium: 2.7 mmol/L — CL (ref 3.5–5.1)
Sodium: 135 mmol/L (ref 135–145)
Total Bilirubin: 1.3 mg/dL — ABNORMAL HIGH (ref 0.3–1.2)
Total Protein: 7.2 g/dL (ref 6.5–8.1)

## 2022-04-01 LAB — CBC
HCT: 27.7 % — ABNORMAL LOW (ref 36.0–46.0)
Hemoglobin: 9.8 g/dL — ABNORMAL LOW (ref 12.0–15.0)
MCH: 26.1 pg (ref 26.0–34.0)
MCHC: 35.4 g/dL (ref 30.0–36.0)
MCV: 73.7 fL — ABNORMAL LOW (ref 80.0–100.0)
Platelets: 179 10*3/uL (ref 150–400)
RBC: 3.76 MIL/uL — ABNORMAL LOW (ref 3.87–5.11)
RDW: 24.5 % — ABNORMAL HIGH (ref 11.5–15.5)
WBC: 2.6 10*3/uL — ABNORMAL LOW (ref 4.0–10.5)
nRBC: 0 % (ref 0.0–0.2)

## 2022-04-01 LAB — CBC WITH DIFFERENTIAL/PLATELET
Abs Immature Granulocytes: 0.01 10*3/uL (ref 0.00–0.07)
Basophils Absolute: 0 10*3/uL (ref 0.0–0.1)
Basophils Relative: 1 %
Eosinophils Absolute: 0 10*3/uL (ref 0.0–0.5)
Eosinophils Relative: 0 %
HCT: 27.8 % — ABNORMAL LOW (ref 36.0–46.0)
Hemoglobin: 9.8 g/dL — ABNORMAL LOW (ref 12.0–15.0)
Immature Granulocytes: 0 %
Lymphocytes Relative: 54 %
Lymphs Abs: 1.3 10*3/uL (ref 0.7–4.0)
MCH: 25.9 pg — ABNORMAL LOW (ref 26.0–34.0)
MCHC: 35.3 g/dL (ref 30.0–36.0)
MCV: 73.4 fL — ABNORMAL LOW (ref 80.0–100.0)
Monocytes Absolute: 0 10*3/uL — ABNORMAL LOW (ref 0.1–1.0)
Monocytes Relative: 1 %
Neutro Abs: 1.1 10*3/uL — ABNORMAL LOW (ref 1.7–7.7)
Neutrophils Relative %: 44 %
Platelets: 185 10*3/uL (ref 150–400)
RBC: 3.79 MIL/uL — ABNORMAL LOW (ref 3.87–5.11)
RDW: 24.6 % — ABNORMAL HIGH (ref 11.5–15.5)
Smear Review: NORMAL
WBC: 2.5 10*3/uL — ABNORMAL LOW (ref 4.0–10.5)
nRBC: 0 % (ref 0.0–0.2)

## 2022-04-01 MED ORDER — SODIUM CHLORIDE 0.9% FLUSH
10.0000 mL | Freq: Once | INTRAVENOUS | Status: AC
  Administered 2022-04-01: 10 mL via INTRAVENOUS
  Filled 2022-04-01: qty 10

## 2022-04-01 MED ORDER — HEPARIN SOD (PORK) LOCK FLUSH 100 UNIT/ML IV SOLN
500.0000 [IU] | Freq: Once | INTRAVENOUS | Status: AC
  Administered 2022-04-01: 500 [IU]
  Filled 2022-04-01: qty 5

## 2022-04-01 MED ORDER — POTASSIUM CHLORIDE 20 MEQ/100ML IV SOLN
20.0000 meq | Freq: Once | INTRAVENOUS | Status: AC
  Administered 2022-04-01: 20 meq via INTRAVENOUS

## 2022-04-01 MED ORDER — NYSTATIN 100000 UNIT/GM EX POWD: 1.0000 | Freq: Three times a day (TID) | CUTANEOUS | 1 refills | Status: DC

## 2022-04-01 MED ORDER — LIDOCAINE-PRILOCAINE 2.5-2.5 % EX CREA: TOPICAL_CREAM | CUTANEOUS | 3 refills | Status: DC

## 2022-04-01 MED ORDER — HEPARIN SOD (PORK) LOCK FLUSH 100 UNIT/ML IV SOLN
500.0000 [IU] | Freq: Once | INTRAVENOUS | Status: AC
  Administered 2022-04-01: 500 [IU] via INTRAVENOUS
  Filled 2022-04-01: qty 5

## 2022-04-01 MED ORDER — SODIUM CHLORIDE 0.9 % IV SOLN
INTRAVENOUS | Status: DC
  Filled 2022-04-01 (×2): qty 250

## 2022-04-01 NOTE — Telephone Encounter (Signed)
FMLA form completed and faxed back to company

## 2022-04-01 NOTE — Progress Notes (Signed)
Sympt[facility] Clinic  [facility] Department of the [place]. [facility] [address], [address] [place], [place] [address] [phone] (phone) [phone] (fax)  Patient Care Team: [facility] as PCP - General (General Practice) [name], RN as Oncology Nurse Navigator   Name of the patient: Jean Morris  [id number]  [birth year]   Date of visit: 04/01/22  Diagnosis- Metastatic Endometrial Cancer  Chief complaint/ Reason for visit- skin rash  Heme/Onc history:  Oncology History  Endometrial cancer (HCC)  01/15/2022 Initial Diagnosis   Endometrial cancer (HCC)   01/15/2022 Cancer Staging   Staging form: Corpus Uteri - Carcinoma and Carcinosarcoma, AJCC 8th Edition - Clinical stage from 01/15/2022: FIGO Stage IVB (cT2, cN2, cM1) - Signed by [name], MD on 01/15/2022 Stage prefix: Initial diagnosis   01/21/2022 -  Chemotherapy   Patient is on Treatment Plan : endometrial Carboplatin + Paclitaxel keytruda q21d       Interval history- Patient is a 47 year old female diagnosed with endometrial cancer currently undergoing carbo-taxol-pembro who presents to [facility] for complaints of skin rash. She has noticed red irritation, moist, somewhat uncomfortable of skin folds. Also requests refill of numbing cream for port. Also complains of sore throat which is mild and not impairing oral intake but is worried aobut foods that may be contributing to tumor growth. Has been adhering to restrictive diet at times. Difficulty swallowing potassium pills. No diarrhea or nausea.   Review of systems- Review of Systems  Constitutional:  Positive for malaise/fatigue. Negative for chills, fever and weight loss.  HENT:  Positive for sore throat. Negative for hearing loss, nosebleeds and tinnitus.   Eyes:  Negative for blurred vision and double vision.  Respiratory:  Negative for  cough, hemoptysis, shortness of breath and wheezing.   Cardiovascular:  Negative for chest pain, palpitations and leg swelling.  Gastrointestinal:  Negative for abdominal pain, blood in stool, constipation, diarrhea, melena, nausea and vomiting.  Genitourinary:  Negative for dysuria and urgency.  Musculoskeletal:  Negative for back pain, falls, joint pain and myalgias.  Skin:  Positive for rash. Negative for itching.  Neurological:  Negative for dizziness, tingling, sensory change, loss of consciousness, weakness and headaches.  Endo/Heme/Allergies:  Negative for environmental allergies. Does not bruise/bleed easily.  Psychiatric/Behavioral:  Negative for depression. The patient is not nervous/anxious and does not have insomnia.      Allergies  Allergen Reactions   Bee Pollen Nausea And Vomiting    Itchy, swelling, watery eyes, runny nose.   Pollen Extract Nausea And Vomiting    Itchy, swelling, watery eyes, runny nose.    Past Medical History:  Diagnosis Date   Anemia    Endometrial cancer (HCC)    Hypertension    Menorrhagia    Pulmonary embolism [facility])     Past Surgical History:  Procedure Laterality Date   CESAREAN SECTION  1994   COLONOSCOPY WITH PROPOFOL N/A 08/06/2021   Procedure: COLONOSCOPY WITH PROPOFOL;  Surgeon: Wyline Mood, MD;  Location[facility] ENDOSCOPY;  Service: Gastroenterology;  Laterality: N/A;   ESOPHAGOGASTRODUODENOSCOPY (EGD) WITH PROPOFOL N/A 02/09/2020   Procedure: ESOPHAGOGASTRODUODENOSCOPY (EGD) WITH PROPOFOL;  Surgeon: Wyline Mood, MD;  Location: [facility] ENDOSCOPY;  Service: Gastroenterology;  Laterality: N/A;   IR IMAGING GUIDED PORT INSERTION  01/17/2022    Social History   Socioeconomic History   Marital status: Media planner    Spouse name: Not on file  Number of children: Not on file   Years of education: Not on file   Highest education level: Not on file  Occupational History   Not on file  Tobacco Use   Smoking status: Never    Smokeless tobacco: Never  Vaping Use   Vaping Use: Never used  Substance and Sexual Activity   Alcohol use: No   Drug use: No   Sexual activity: Not Currently    Birth control/protection: I.U.D.    Comment: patient states MD said did not see in CT today 01/17/2022  Other Topics Concern   Not on file  Social History Narrative   [facility], partner, and no pets.   Social Determinants of Health   Financial Resource Strain: Not on file  Food Insecurity: Not on file  Transportation Needs: Not on file  Physical Activity: Not on file  Stress: Not on file  Social Connections: Not on file  Intimate Partner Violence: Not on file    Family History  Problem Relation Age of Onset   Hypertension Mother    Stroke Mother    Heart attack Mother    Cancer Father    Hypertension Father    Cancer Sister    Cancer Sister    Cancer Brother    Hypertension Other      Current Outpatient Medications:    apixaban (ELIQUIS) 2.5 MG TABS tablet, Take 1 tablet (2.5 mg total) by mouth 2 (two) times daily., Disp: 60 tablet, Rfl: 3   dexamethasone (DECADRON) 4 MG tablet, Take 2 tablets by mouth starting the day after chemotherapy for 3 days. Take with food., Disp: 30 tablet, Rfl: 1   levonorgestrel (MIRENA) 20 MCG/24HR IUD, 1 Intra Uterine Device (1 each total) by Intrauterine route once., Disp: 1 each, Rfl: 0   lidocaine-prilocaine (EMLA) cream, Apply to affected area once, Disp: 30 g, Rfl: 3   linaclotide (LINZESS) 290 MCG CAPS capsule, Take 1 capsule (290 mcg total) by mouth daily before breakfast., Disp: 30 capsule, Rfl: 11   LORazepam (ATIVAN) 0.5 MG tablet, Take 1 tablet (0.5 mg total) by mouth every 8 (eight) hours., Disp: 30 tablet, Rfl: 0   magnesium hydroxide (MILK OF MAGNESIA) 400 MG/5ML suspension, Take 15 mLs by mouth daily as needed for moderate constipation. Take if no bowel movement for 2 days., Disp: , Rfl:    nystatin (MYCOSTATIN/NYSTOP) powder, Apply 1 Application topically 3  (three) times daily., Disp: 30 g, Rfl: 0   oxyCODONE (OXY IR/ROXICODONE) 5 MG immediate release tablet, Take 1 tablet (5 mg total) by mouth every 4 (four) hours as needed for severe pain., Disp: 120 tablet, Rfl: 0   pantoprazole (PROTONIX) 40 MG tablet, Take 1 tablet (40 mg total) by mouth daily., Disp: 30 tablet, Rfl: 2   polyethylene glycol (MIRALAX) 17 g packet, Take 17 g by mouth daily. To prevent constipation, Disp: 30 each, Rfl: 3   potassium chloride SA (KLOR-CON M) 20 MEQ tablet, Take 1 tablet (20 mEq total) by mouth 2 (two) times daily. Dissolve tablets, Disp: 60 tablet, Rfl: 1   senna (SENOKOT) 8.6 MG TABS tablet, Take 1-2 tablets (8.6-17.2 mg total) by mouth daily. To prevent constipation, Disp: 120 tablet, Rfl: 0   ondansetron (ZOFRAN) 8 MG tablet, Take 1 tablet (8 mg total) by mouth every 8 (eight) hours as needed for nausea or vomiting. Start on the third day after chemotherapy. (Patient not taking: Reported on 03/26/2022), Disp: 30 tablet, Rfl: 1   prochlorperazine (COMPAZINE) 10  MG tablet, Take 1 tablet (10 mg total) by mouth every 6 (six) hours as needed for nausea or vomiting. (Patient not taking: Reported on 03/26/2022), Disp: 30 tablet, Rfl: 1  Physical exam:  Vitals:   04/01/22 1443  BP: 94/71  Pulse: (!) 135  Temp: 99.2 F (37.3 C)  TempSrc: Tympanic  SpO2: 100%  Weight: 216 lb (98 kg)   Physical Exam Vitals reviewed.  Constitutional:      Appearance: She is not ill-appearing.  HENT:     Right Ear: Ear canal and external ear normal.     Left Ear: Ear canal and external ear normal.     Nose: No congestion or rhinorrhea.     Mouth/Throat:     Mouth: Mucous membranes are moist.     Pharynx: Oropharynx is clear. No oropharyngeal exudate or posterior oropharyngeal erythema.  Cardiovascular:     Rate and Rhythm: Normal rate and regular rhythm.  Pulmonary:     Effort: No respiratory distress.  Abdominal:     General: There is no distension.     Tenderness: There is  no abdominal tenderness.  Skin:    General: Skin is warm.     Coloration: Skin is not jaundiced.     Findings: Rash (moist appearing pinpoint red spots of pannus and under breasts) present.  Neurological:     Mental Status: She is alert and oriented to person, place, and time.  Psychiatric:        Mood and Affect: Mood normal.        Behavior: Behavior normal.        Latest Ref Rng & Units 03/26/2022    8:29 AM  CMP  Glucose 70 - 99 mg/dL 90   BUN 6 - 20 mg/dL <5   Creatinine 2.950.44 - 1.00 mg/dL 6.210.65   Sodium 308135 - 657145 mmol/L 137   Potassium 3.5 - 5.1 mmol/L 3.0   Chloride 98 - 111 mmol/L 101   CO2 22 - 32 mmol/L 22   Calcium 8.9 - 10.3 mg/dL 9.4   Total Protein 6.5 - 8.1 g/dL 8.0   Total Bilirubin 0.3 - 1.2 mg/dL 1.2   Alkaline Phos 38 - 126 U/L 62   AST 15 - 41 U/L 25   ALT 0 - 44 U/L 21       Latest Ref Rng & Units 04/01/2022    2:21 PM  CBC  WBC 4.0 - 10.5 K/uL 4.0 - 10.5 K/uL 2.6    2.5   Hemoglobin 12.0 - 15.0 g/dL 84.612.0 - 96.215.0 g/dL 9.8    9.8   Hematocrit 36.0 - 46.0 % 36.0 - 46.0 % 27.7    27.8   Platelets 150 - 400 K/uL 150 - 400 K/uL 179    185     No images are attached to the encounter.  CT CHEST ABDOMEN PELVIS W CONTRAST  Result Date: 03/17/2022 CLINICAL DATA:  History of endometrial cancer, follow-up. * Tracking Code: BO * EXAM: CT CHEST, ABDOMEN, AND PELVIS WITH CONTRAST TECHNIQUE: Multidetector CT imaging of the chest, abdomen and pelvis was performed following the standard protocol during bolus administration of intravenous contrast. RADIATION DOSE REDUCTION: This exam was performed according to the departmental dose-optimization program which includes automated exposure control, adjustment of the mA and/or kV according to patient size and/or use of iterative reconstruction technique. CONTRAST:  100mL OMNIPAQUE IOHEXOL 300 MG/ML  SOLN COMPARISON:  Multiple priors including CT December 25, 2021. FINDINGS: CT CHEST FINDINGS Cardiovascular:  Right chest  Port-A-Cath with tip at the superior cavoatrial junction. Normal caliber thoracic aorta. No central pulmonary embolus on this nondedicated study. Normal size heart. No significant pericardial effusion/thickening. Mediastinum/Nodes: No supraclavicular adenopathy. No suspicious thyroid nodule. No pathologically enlarged mediastinal, hilar or axillary lymph nodes. Lungs/Pleura: Continued decrease in size of the posterior right lower lobe pulmonary nodule which now measures 4 mm on image 101/3 previously 8 mm. No new suspicious pulmonary nodules or masses. Mild diffuse bronchial wall thickening with mosaic attenuation of the lungs. No pleural effusion. No pneumothorax. Similar linear band of scarring along the left major fissure on image 94/3 Musculoskeletal: Multilevel degenerative changes spine. Degenerative change of the bilateral shoulders. CT ABDOMEN PELVIS FINDINGS Hepatobiliary: Diffuse hepatic steatosis. No suspicious hepatic lesion. Gallbladder is unremarkable. No biliary ductal dilation. Pancreas: No pancreatic ductal dilation or evidence of acute inflammation Spleen: No splenomegaly. Adrenals/Urinary Tract: Bilateral adrenal glands appear normal. No hydronephrosis. Kidneys demonstrate symmetric enhancement. Urinary bladder is unremarkable for degree of distension Stomach/Bowel: Stomach is unremarkable for degree of distension. No pathologic dilation of small or large bowel. No evidence of acute bowel inflammation Vascular/Lymphatic: Normal caliber abdominal aorta. Smooth IVC contours. Portal, splenic and superior mesenteric veins are patent. Overall decreased retrocrural and retroperitoneal adenopathy. For reference: -retrocrural lymph node measures 4 mm in short axis on image 44/2 previously 16 mm in short axis. -aortocaval lymph node measures 5 mm in short axis on image 75/2 previously 2.2 cm. -left periaortic lymph node at the level of the renal hilum measures 7 mm in short axis on image 64/2 previously 18  mm. Centrally necrotic left periaortic lymph node/lymph node conglomerate with adjacent infiltrative/desmoplastic stranding measures 3.8 x 3.6 cm on image 71/2 previously 2.8 x 4.4 cm. Pathologically enlarged pelvic lymph nodes. Reproductive: Persistent heterogeneous irregularity of the endometrium with uterine enlargement compatible with known endometrial malignancy. New heterogeneity of the 2.8 cm left ovarian lesion on image 97/2 Other: No significant abdominopelvic free fluid. No discrete peritoneal or omental nodularity. Musculoskeletal: New pathologic compression deformity of the L2 vertebral body. No new suspicious osseous lesions identified. Multilevel degenerative changes spine. Degenerative change of the bilateral hips. IMPRESSION: 1. Persistent heterogeneous irregularity of the endometrium with uterine enlargement compatible with known endometrial malignancy. 2. New heterogeneity of the 2.8 cm left ovarian lesion, nonspecific but possibly reflecting metastatic disease. Consider further evaluation with pelvic ultrasound. 3. Overall decreased retrocrural and retroperitoneal adenopathy. However, there is interval increase in size and adjacent infiltrative/desmoplastic stranding adjacent to a centrally necrotic left periaortic lymph node/lymph node conglomerate. 4. Continued decrease in size of the posterior right lower lobe pulmonary nodule. 5. New pathologic compression deformity of the L2 vertebral body. 6. Diffuse hepatic steatosis. 7. Mild diffuse bronchial wall thickening with mosaic attenuation of the lungs, suggestive of small airways disease. Electronically Signed   By: Maudry Mayhew M.D.   On: 03/17/2022 10:21   DG Chest 2 View  Result Date: 03/10/2022 CLINICAL DATA:  Shortness of breath. EXAM: CHEST - 2 VIEW COMPARISON:  None Available. FINDINGS: The heart size and mediastinal contours are within normal limits. Similar position right IJ Port-A-Cath with the tip at the superior cavoatrial  junction. Both lungs are clear. Known right lower lobe pulmonary nodules not visible radiographically. No visible pleural effusions or pneumothorax. No acute osseous abnormality. IMPRESSION: No active cardiopulmonary disease. Electronically Signed   By: Feliberto Harts M.D.   On: 03/10/2022 10:29    Assessment and plan- Patient is a 47 y.o. female diagnosed with  stage IV endometrioid endometrial cancer, currently s/p 4 cycles of carbo-taxol-pembro chemo-immunotherapy who presents to [facility] for complaints of:    Intertriginous dermatitis- d/t moisture. Clinically not consistent with immunotherapy dermatitis or chemotherapy dermatitis. Keep area clean and dry. Frequent position changes to aid in moisture evaporation. Prescription for nystatin powder sent to pharmacy.  Hypokalemia- K 2.7. Plan for IV KCl 20 meq today. Encouraged home oral potassium as prescribed. Plan to repeat potassium repletion tomorrow.  Sore throat- possible allergies vs chemo changes. Mild. Not limiting oral intake however, limiting intake of oral potassium tablets (large size). We reviewed how to dissolve tablets, swallow in applesauce, etc. Salt water gargles and magic mouthwash recommended (swish and swallow). If worsening or develops white spots, notify clinic for re-evaluation. Ok to take claritin or zyrtec for seasonal allergies.  Impaired intake- weight stable but impaired intake of certain foods/textures/pills, and taste changes. Worried about foods that might be promoting tumor growth. Will refer to [facility] for evaluation tomorrow during infusion.  Port-a-cath: will refill emla cream today.   Disposition:  Fluids & potassium today Fluids & potassium tomorrow Ref to Dietician Drema Halon to see her tomorrow) RTC as scheduled for f/u with Dr Smith Robert or sooner if symptoms don't improve or worsen.   Visit Diagnosis 1. Port-A-Cath in place   2. Endometrial cancer (HCC)   3. Sore throat     Patient expressed  understanding and was in agreement with this plan. She also understands that She can call clinic at any time with any questions, concerns, or complaints.   Thank you for allowing me to participate in the care of this very pleasant patient.   Consuello Masse, DNP, AGNP-C, AOCNP [facility] [phone]

## 2022-04-02 ENCOUNTER — Inpatient Hospital Stay: Payer: BC Managed Care – PPO

## 2022-04-02 VITALS — BP 141/100 | HR 92 | Temp 96.5°F

## 2022-04-02 DIAGNOSIS — Z5112 Encounter for antineoplastic immunotherapy: Secondary | ICD-10-CM | POA: Diagnosis not present

## 2022-04-02 DIAGNOSIS — R000 Tachycardia, unspecified: Secondary | ICD-10-CM | POA: Diagnosis not present

## 2022-04-02 DIAGNOSIS — R911 Solitary pulmonary nodule: Secondary | ICD-10-CM | POA: Diagnosis not present

## 2022-04-02 DIAGNOSIS — K5900 Constipation, unspecified: Secondary | ICD-10-CM | POA: Diagnosis not present

## 2022-04-02 DIAGNOSIS — M4856XA Collapsed vertebra, not elsewhere classified, lumbar region, initial encounter for fracture: Secondary | ICD-10-CM | POA: Diagnosis not present

## 2022-04-02 DIAGNOSIS — R590 Localized enlarged lymph nodes: Secondary | ICD-10-CM | POA: Diagnosis not present

## 2022-04-02 DIAGNOSIS — R059 Cough, unspecified: Secondary | ICD-10-CM | POA: Diagnosis not present

## 2022-04-02 DIAGNOSIS — D6481 Anemia due to antineoplastic chemotherapy: Secondary | ICD-10-CM | POA: Diagnosis not present

## 2022-04-02 DIAGNOSIS — C541 Malignant neoplasm of endometrium: Secondary | ICD-10-CM | POA: Diagnosis not present

## 2022-04-02 DIAGNOSIS — I10 Essential (primary) hypertension: Secondary | ICD-10-CM | POA: Diagnosis not present

## 2022-04-02 DIAGNOSIS — G620 Drug-induced polyneuropathy: Secondary | ICD-10-CM | POA: Diagnosis not present

## 2022-04-02 DIAGNOSIS — R509 Fever, unspecified: Secondary | ICD-10-CM | POA: Diagnosis not present

## 2022-04-02 DIAGNOSIS — D509 Iron deficiency anemia, unspecified: Secondary | ICD-10-CM | POA: Diagnosis not present

## 2022-04-02 DIAGNOSIS — I2699 Other pulmonary embolism without acute cor pulmonale: Secondary | ICD-10-CM | POA: Diagnosis not present

## 2022-04-02 DIAGNOSIS — R042 Hemoptysis: Secondary | ICD-10-CM | POA: Diagnosis not present

## 2022-04-02 DIAGNOSIS — Z95828 Presence of other vascular implants and grafts: Secondary | ICD-10-CM

## 2022-04-02 DIAGNOSIS — Z79899 Other long term (current) drug therapy: Secondary | ICD-10-CM | POA: Diagnosis not present

## 2022-04-02 DIAGNOSIS — E876 Hypokalemia: Secondary | ICD-10-CM

## 2022-04-02 DIAGNOSIS — D508 Other iron deficiency anemias: Secondary | ICD-10-CM

## 2022-04-02 MED ORDER — HEPARIN SOD (PORK) LOCK FLUSH 100 UNIT/ML IV SOLN
500.0000 [IU] | Freq: Once | INTRAVENOUS | Status: AC
  Administered 2022-04-02: 500 [IU]
  Filled 2022-04-02: qty 5

## 2022-04-02 MED ORDER — SODIUM CHLORIDE 0.9 % IV SOLN
INTRAVENOUS | Status: DC
  Filled 2022-04-02 (×2): qty 250

## 2022-04-02 MED ORDER — POTASSIUM CHLORIDE 20 MEQ/100ML IV SOLN
20.0000 meq | Freq: Once | INTRAVENOUS | Status: AC
  Administered 2022-04-02: 20 meq via INTRAVENOUS

## 2022-04-02 MED ORDER — SODIUM CHLORIDE 0.9% FLUSH
10.0000 mL | Freq: Once | INTRAVENOUS | Status: AC
  Administered 2022-04-02: 10 mL via INTRAVENOUS
  Filled 2022-04-02: qty 10

## 2022-04-02 NOTE — Progress Notes (Signed)
Nutrition Assessment   Reason for Assessment:  Diet Education    ASSESSMENT:  47 year old female with stage IV endometrial cancer.  Past medical history of HTN, PE Patient receiving carbotaxol and [place].    Met with patient during fluids and K supplementation.  Patient reports that appetite is better.  Denies nausea.  Has lots of questions regarding foods that she can eat during treatment.  Has been limiting her intake due to unsure what she can eat.  Has been having trouble with constipation but that is better after starting miralax daily.  Has not added senna.  Had bowel movement yesterday and today.  Reports some issues with reflux but takes protonix to reduce symptoms   Medications: miralax, senna, [place], mom, protonix   Labs: K 2.7   Anthropometrics:   Height: 65 inches Weight: 217 lb on 1/31 244 lb on 12/6 BMI: 36  11 % weight loss in the last 2 months, significant   Estimated Energy Needs  Kcals: 2200 Protein: 110 g Fluid: 2200 ml   NUTRITION DIAGNOSIS: Inadequate oral intake related to cancer related treatment side effects and unsure what foods to eat as evidenced by 11% weight loss in the last 2 months   INTERVENTION:  Answered patient questions regarding foods to eat during treatment and food safety concerns Discussed strategies to help with constipation. Handout provided Discussed foods to choose with reflux.   Discussed foods to choose with low potassium.  Written information provided Contact information provided   MONITORING, EVALUATION, GOAL: weight trends, intake   Next Visit: Wednesday, Feb 21 during infusion  Claudeen Leason B. Zenia Resides, [place], [place] Registered Dietitian [phone]

## 2022-04-04 ENCOUNTER — Other Ambulatory Visit: Payer: Self-pay

## 2022-04-07 ENCOUNTER — Telehealth: Payer: Self-pay | Admitting: *Deleted

## 2022-04-07 NOTE — Telephone Encounter (Signed)
Patient called reporting that she has allergies or a cold which she usually gets this time of year. She has congestion and runny nose, cough, watery eyes and is asking what she can take for it. She also reports that she has tingling in her left hand and both feet. Please advise

## 2022-04-07 NOTE — Telephone Encounter (Signed)
Uri symptoms- ok to take otc decongesant and antihistamines like loratidine or cetrizine. Neuropathy- discuss at next visit

## 2022-04-07 NOTE — Telephone Encounter (Signed)
I called patient who said that she already spoke with Judeen Hammans

## 2022-04-15 MED FILL — Dexamethasone Sodium Phosphate Inj 100 MG/10ML: INTRAMUSCULAR | Qty: 1 | Status: AC

## 2022-04-15 MED FILL — Fosaprepitant Dimeglumine For IV Infusion 150 MG (Base Eq): INTRAVENOUS | Qty: 5 | Status: AC

## 2022-04-16 ENCOUNTER — Encounter: Payer: Self-pay | Admitting: Oncology

## 2022-04-16 ENCOUNTER — Other Ambulatory Visit: Payer: Self-pay | Admitting: *Deleted

## 2022-04-16 ENCOUNTER — Telehealth: Payer: Self-pay | Admitting: *Deleted

## 2022-04-16 ENCOUNTER — Inpatient Hospital Stay: Payer: BC Managed Care – PPO

## 2022-04-16 ENCOUNTER — Inpatient Hospital Stay (HOSPITAL_BASED_OUTPATIENT_CLINIC_OR_DEPARTMENT_OTHER): Payer: BC Managed Care – PPO | Admitting: Oncology

## 2022-04-16 VITALS — HR 97

## 2022-04-16 VITALS — BP 133/91 | HR 109 | Temp 98.5°F | Resp 18 | Ht 65.0 in | Wt 220.0 lb

## 2022-04-16 DIAGNOSIS — R590 Localized enlarged lymph nodes: Secondary | ICD-10-CM | POA: Diagnosis not present

## 2022-04-16 DIAGNOSIS — G620 Drug-induced polyneuropathy: Secondary | ICD-10-CM | POA: Diagnosis not present

## 2022-04-16 DIAGNOSIS — R059 Cough, unspecified: Secondary | ICD-10-CM | POA: Diagnosis not present

## 2022-04-16 DIAGNOSIS — D649 Anemia, unspecified: Secondary | ICD-10-CM

## 2022-04-16 DIAGNOSIS — D508 Other iron deficiency anemias: Secondary | ICD-10-CM

## 2022-04-16 DIAGNOSIS — R509 Fever, unspecified: Secondary | ICD-10-CM | POA: Diagnosis not present

## 2022-04-16 DIAGNOSIS — C541 Malignant neoplasm of endometrium: Secondary | ICD-10-CM

## 2022-04-16 DIAGNOSIS — Z5112 Encounter for antineoplastic immunotherapy: Secondary | ICD-10-CM | POA: Diagnosis not present

## 2022-04-16 DIAGNOSIS — T451X5A Adverse effect of antineoplastic and immunosuppressive drugs, initial encounter: Secondary | ICD-10-CM

## 2022-04-16 DIAGNOSIS — I2699 Other pulmonary embolism without acute cor pulmonale: Secondary | ICD-10-CM | POA: Diagnosis not present

## 2022-04-16 DIAGNOSIS — D509 Iron deficiency anemia, unspecified: Secondary | ICD-10-CM | POA: Diagnosis not present

## 2022-04-16 DIAGNOSIS — Z5111 Encounter for antineoplastic chemotherapy: Secondary | ICD-10-CM | POA: Diagnosis not present

## 2022-04-16 DIAGNOSIS — K5900 Constipation, unspecified: Secondary | ICD-10-CM | POA: Diagnosis not present

## 2022-04-16 DIAGNOSIS — R000 Tachycardia, unspecified: Secondary | ICD-10-CM | POA: Diagnosis not present

## 2022-04-16 DIAGNOSIS — I10 Essential (primary) hypertension: Secondary | ICD-10-CM | POA: Diagnosis not present

## 2022-04-16 DIAGNOSIS — D6481 Anemia due to antineoplastic chemotherapy: Secondary | ICD-10-CM | POA: Diagnosis not present

## 2022-04-16 DIAGNOSIS — R911 Solitary pulmonary nodule: Secondary | ICD-10-CM | POA: Diagnosis not present

## 2022-04-16 DIAGNOSIS — R042 Hemoptysis: Secondary | ICD-10-CM | POA: Diagnosis not present

## 2022-04-16 DIAGNOSIS — M4856XA Collapsed vertebra, not elsewhere classified, lumbar region, initial encounter for fracture: Secondary | ICD-10-CM | POA: Diagnosis not present

## 2022-04-16 DIAGNOSIS — Z79899 Other long term (current) drug therapy: Secondary | ICD-10-CM | POA: Diagnosis not present

## 2022-04-16 LAB — COMPREHENSIVE METABOLIC PANEL
ALT: 12 U/L (ref 0–44)
AST: 19 U/L (ref 15–41)
Albumin: 2.8 g/dL — ABNORMAL LOW (ref 3.5–5.0)
Alkaline Phosphatase: 68 U/L (ref 38–126)
Anion gap: 9 (ref 5–15)
BUN: 6 mg/dL (ref 6–20)
CO2: 25 mmol/L (ref 22–32)
Calcium: 9 mg/dL (ref 8.9–10.3)
Chloride: 105 mmol/L (ref 98–111)
Creatinine, Ser: 0.76 mg/dL (ref 0.44–1.00)
GFR, Estimated: >60 mL/min (ref >60)
Glucose, Bld: 88 mg/dL (ref 70–99)
Potassium: 3.5 mmol/L (ref 3.5–5.1)
Sodium: 139 mmol/L (ref 135–145)
Total Bilirubin: 0.6 mg/dL (ref 0.3–1.2)
Total Protein: 7.3 g/dL (ref 6.5–8.1)

## 2022-04-16 LAB — CBC WITH DIFFERENTIAL/PLATELET
Abs Immature Granulocytes: 0.08 10*3/uL — ABNORMAL HIGH (ref 0.00–0.07)
Basophils Absolute: 0 10*3/uL (ref 0.0–0.1)
Basophils Relative: 1 %
Eosinophils Absolute: 0 10*3/uL (ref 0.0–0.5)
Eosinophils Relative: 1 %
HCT: 25.9 % — ABNORMAL LOW (ref 36.0–46.0)
Hemoglobin: 8.9 g/dL — ABNORMAL LOW (ref 12.0–15.0)
Immature Granulocytes: 1 %
Lymphocytes Relative: 40 %
Lymphs Abs: 2.3 10*3/uL (ref 0.7–4.0)
MCH: 26.4 pg (ref 26.0–34.0)
MCHC: 34.4 g/dL (ref 30.0–36.0)
MCV: 76.9 fL — ABNORMAL LOW (ref 80.0–100.0)
Monocytes Absolute: 0.3 10*3/uL (ref 0.1–1.0)
Monocytes Relative: 5 %
Neutro Abs: 3 10*3/uL (ref 1.7–7.7)
Neutrophils Relative %: 52 %
Platelets: 375 10*3/uL (ref 150–400)
RBC: 3.37 MIL/uL — ABNORMAL LOW (ref 3.87–5.11)
RDW: 23.5 % — ABNORMAL HIGH (ref 11.5–15.5)
WBC: 5.8 10*3/uL (ref 4.0–10.5)
nRBC: 0 % (ref 0.0–0.2)

## 2022-04-16 LAB — IRON AND TIBC
Iron: 59 ug/dL (ref 28–170)
Saturation Ratios: 43 % — ABNORMAL HIGH (ref 10.4–31.8)
TIBC: 137 ug/dL — ABNORMAL LOW (ref 250–450)
UIBC: 78 ug/dL

## 2022-04-16 LAB — HCG, QUANTITATIVE, PREGNANCY: hCG, Beta Chain, Quant, S: 1 m[IU]/mL (ref <5)

## 2022-04-16 LAB — VITAMIN B12: Vitamin B-12: 875 pg/mL (ref 180–914)

## 2022-04-16 LAB — FERRITIN: Ferritin: 287 ng/mL (ref 11–307)

## 2022-04-16 LAB — TSH: TSH: 0.998 u[IU]/mL (ref 0.350–4.500)

## 2022-04-16 MED ORDER — SODIUM CHLORIDE 0.9 % IV SOLN
150.0000 mg | Freq: Once | INTRAVENOUS | Status: AC
  Administered 2022-04-16: 150 mg via INTRAVENOUS
  Filled 2022-04-16: qty 150

## 2022-04-16 MED ORDER — PALONOSETRON HCL INJECTION 0.25 MG/5ML
0.2500 mg | Freq: Once | INTRAVENOUS | Status: AC
  Administered 2022-04-16: 0.25 mg via INTRAVENOUS
  Filled 2022-04-16: qty 5

## 2022-04-16 MED ORDER — SODIUM CHLORIDE 0.9 % IV SOLN
750.0000 mg | Freq: Once | INTRAVENOUS | Status: AC
  Administered 2022-04-16: 750 mg via INTRAVENOUS
  Filled 2022-04-16: qty 75

## 2022-04-16 MED ORDER — SODIUM CHLORIDE 0.9 % IV SOLN
10.0000 mg | Freq: Once | INTRAVENOUS | Status: AC
  Administered 2022-04-16: 10 mg via INTRAVENOUS
  Filled 2022-04-16: qty 10

## 2022-04-16 MED ORDER — DIPHENHYDRAMINE HCL 50 MG/ML IJ SOLN
25.0000 mg | Freq: Once | INTRAMUSCULAR | Status: AC
  Administered 2022-04-16: 25 mg via INTRAVENOUS
  Filled 2022-04-16: qty 1

## 2022-04-16 MED ORDER — FAMOTIDINE IN NACL 20-0.9 MG/50ML-% IV SOLN
20.0000 mg | Freq: Once | INTRAVENOUS | Status: AC
  Administered 2022-04-16: 20 mg via INTRAVENOUS
  Filled 2022-04-16: qty 50

## 2022-04-16 MED ORDER — SODIUM CHLORIDE 0.9 % IV SOLN
200.0000 mg | Freq: Once | INTRAVENOUS | Status: AC
  Administered 2022-04-16: 200 mg via INTRAVENOUS
  Filled 2022-04-16: qty 8

## 2022-04-16 MED ORDER — SODIUM CHLORIDE 0.9 % IV SOLN
175.0000 mg/m2 | Freq: Once | INTRAVENOUS | Status: AC
  Administered 2022-04-16: 402 mg via INTRAVENOUS
  Filled 2022-04-16: qty 67

## 2022-04-16 MED ORDER — SODIUM CHLORIDE 0.9 % IV SOLN
Freq: Once | INTRAVENOUS | Status: AC
  Filled 2022-04-16: qty 250

## 2022-04-16 MED ORDER — HEPARIN SOD (PORK) LOCK FLUSH 100 UNIT/ML IV SOLN
500.0000 [IU] | Freq: Once | INTRAVENOUS | Status: AC | PRN
  Administered 2022-04-16: 500 [IU]
  Filled 2022-04-16: qty 5

## 2022-04-16 NOTE — Progress Notes (Signed)
Reduce taxol dose to 137m/m2 per MD going forward.

## 2022-04-16 NOTE — Telephone Encounter (Signed)
She sent a message my chart and dr Janese Banks responded. I went back to infusion and let her know that she can continue to take claritin and she can use robitussin OTC and if it does not work then let us know and we can get Rx cough syrup. Pt was agreeable

## 2022-04-16 NOTE — Progress Notes (Signed)
Patient does not have any concerns.

## 2022-04-16 NOTE — Progress Notes (Signed)
Nutrition Follow-up:  Patient with endometrial stage IV cancer.  Patient receiving carbotaxol and [place].    Met with patient during infusion. Reports that she has been eating more because she knows what foods she can eat.  Denies nausea.  Reports bowels are moving with miralax daily, sometimes she adds MOM if needed.  Yesterday ate chicken biscuit, hashbrown for breakfast.  Lunch was [employer] and ate leftovers for dinner.  Reports reflux has been controlled    Medications: reviewed  Labs: reviewed  Anthropometrics:   Weight 220 lb today 217 lb on 1/31 244 lb on 12/6   NUTRITION DIAGNOSIS: Inadequate oral intake improved   INTERVENTION:  Answered further questions regarding foods she can eat during treatment Encouraged good sources of lean protein and fruits, vegetables, whole grains Contact information provided    MONITORING, EVALUATION, GOAL: weight trends, intake   NEXT VISIT: Wednesday, Mar 13 during infusion  Elyna Pangilinan B. Zenia Resides, [place], [place] Registered Dietitian [phone]

## 2022-04-16 NOTE — Progress Notes (Signed)
Hematology/Oncology Consult note [facility]  Telephone:([phone] [phone]  Patient Care Team: Center, [facility] as PCP - General (General Practice) [name], RN as Oncology Nurse Navigator   Name of the patient: Jean Morris  [id number]  Jul 05, 1975   Date of visit: 04/16/22  Diagnosis-  stage IVb endometrioid endometrial cancer      Chief complaint/ Reason for visit- on treatment assessment prior to cycle 5 of carbo/ taxol/ keytruda   Heme/Onc history: Patient is a 47 year old female who has seen [name] for irregular menstrual bleeding.  He has had an IUD in place and initially it was attribute it to use of IUD and was tried to be controlled with progesterone agents.  However due to worsening abdominal pain patient underwent a CT abdomen and pelvis with contrast on 12/25/2021 which showed 8 mm   Posterior lung base mass.  Retroperitoneal adenopathy measuring 2.6 cm additional retrocrural adenopathy.  Faint lucent lesion involving L2 vertebral body.  The uterus is anteverted and enlarged.  Endometrium is enlarged heterogeneous and irregular extending to the fundal myometrium.  Findings consistent with endometrial malignancy.  CT chest without contrast showed a 10 mm right lower lobe subpleural nodule.   Patient had endometrial biopsy which was consistent FIGO grade 2 endometrioid endometrial carcinoma.  Patient was seen by GYN oncology and hopes to take her for surgery if she has good response to neoadjuvant chemotherapy.MSI unstable.  BRAF negative.  Loss of major and minor MMR proteins MLH1 and PMS2 less than 5% of tumor expression.  MLH1 hyper methylation present.  This is most likely due to somatic epigenetic modification which can be seen in about 77% of sporadic endometrial cancers.   Patient currently on CarboTaxol Keytruda chemotherapy  Interval history-patient states that she is doing well presently.  Appetite  is improving.  Pain is well-controlled.  Reports occasional tingling numbness in her index finger and thumb which is intermittent  ECOG PS- 2 Pain scale- 0 Opioid associated constipation- no  Review of systems- Review of Systems  Constitutional:  Negative for chills, fever, malaise/fatigue and weight loss.  HENT:  Negative for congestion, ear discharge and nosebleeds.   Eyes:  Negative for blurred vision.  Respiratory:  Negative for cough, hemoptysis, sputum production, shortness of breath and wheezing.   Cardiovascular:  Negative for chest pain, palpitations, orthopnea and claudication.  Gastrointestinal:  Negative for abdominal pain, blood in stool, constipation, diarrhea, heartburn, melena, nausea and vomiting.  Genitourinary:  Negative for dysuria, flank pain, frequency, hematuria and urgency.  Musculoskeletal:  Negative for back pain, joint pain and myalgias.  Skin:  Negative for rash.  Neurological:  Negative for dizziness, tingling, focal weakness, seizures, weakness and headaches.  Endo/Heme/Allergies:  Does not bruise/bleed easily.  Psychiatric/Behavioral:  Negative for depression and suicidal ideas. The patient does not have insomnia.       Allergies  Allergen Reactions   Bee Pollen Nausea And Vomiting    Itchy, swelling, watery eyes, runny nose.   Pollen Extract Nausea And Vomiting    Itchy, swelling, watery eyes, runny nose.     Past Medical History:  Diagnosis Date   Anemia    Endometrial cancer ([place])    Hypertension    Menorrhagia    Pulmonary embolism [facility])      Past Surgical History:  Procedure Laterality Date   CESAREAN SECTION  1994   COLONOSCOPY WITH PROPOFOL N/A 08/06/2021   Procedure: COLONOSCOPY WITH PROPOFOL;  Surgeon: Jonathon Bellows, MD;  Location[facility] ENDOSCOPY;  Service: Gastroenterology;  Laterality: N/A;   ESOPHAGOGASTRODUODENOSCOPY (EGD) WITH PROPOFOL N/A 02/09/2020   Procedure: ESOPHAGOGASTRODUODENOSCOPY (EGD) WITH PROPOFOL;  Surgeon: Jonathon Bellows, MD;  Location[facility] ENDOSCOPY;  Service: Gastroenterology;  Laterality: N/A;   IR IMAGING GUIDED PORT INSERTION  01/17/2022    Social History   Socioeconomic History   Marital status: Soil scientist    Spouse name: Not on file   Number of children: Not on file   Years of education: Not on file   Highest education level: Not on file  Occupational History   Not on file  Tobacco Use   Smoking status: Never   Smokeless tobacco: Never  Vaping Use   Vaping Use: Never used  Substance and Sexual Activity   Alcohol use: No   Drug use: No   Sexual activity: Not Currently    Birth control/protection: I.U.D.    Comment: patient states MD said did not see in CT today 01/17/2022  Other Topics Concern   Not on file  Social History Narrative   Lives with [facility], partner, and no pets.   Social Determinants of Health   Financial Resource Strain: Not on file  Food Insecurity: Not on file  Transportation Needs: Not on file  Physical Activity: Not on file  Stress: Not on file  Social Connections: Not on file  Intimate Partner Violence: Not on file    Family History  Problem Relation Age of Onset   Hypertension Mother    Stroke Mother    Heart attack Mother    Cancer Father    Hypertension Father    Cancer Sister    Cancer Sister    Cancer Brother    Hypertension Other      Current Outpatient Medications:    apixaban (ELIQUIS) 2.5 MG TABS tablet, Take 1 tablet (2.5 mg total) by mouth 2 (two) times daily., Disp: 60 tablet, Rfl: 3   dexamethasone (DECADRON) 4 MG tablet, Take 2 tablets by mouth starting the day after chemotherapy for 3 days. Take with food., Disp: 30 tablet, Rfl: 1   levonorgestrel (MIRENA) 20 MCG/24HR IUD, 1 Intra Uterine Device (1 each total) by Intrauterine route once., Disp: 1 each, Rfl: 0   lidocaine-prilocaine (EMLA) cream, Apply to port site as directed, Disp: 30 g, Rfl: 3   linaclotide (LINZESS) 290 MCG CAPS capsule, Take 1 capsule (290 mcg total)  by mouth daily before breakfast., Disp: 30 capsule, Rfl: 11   LORazepam (ATIVAN) 0.5 MG tablet, Take 1 tablet (0.5 mg total) by mouth every 8 (eight) hours., Disp: 30 tablet, Rfl: 0   magnesium hydroxide (MILK OF MAGNESIA) 400 MG/5ML suspension, Take 15 mLs by mouth daily as needed for moderate constipation. Take if no bowel movement for 2 days., Disp: , Rfl:    nystatin (MYCOSTATIN/NYSTOP) powder, Apply 1 Application topically 3 (three) times daily., Disp: 30 g, Rfl: 1   ondansetron (ZOFRAN) 8 MG tablet, Take 1 tablet (8 mg total) by mouth every 8 (eight) hours as needed for nausea or vomiting. Start on the third day after chemotherapy., Disp: 30 tablet, Rfl: 1   oxyCODONE (OXY IR/ROXICODONE) 5 MG immediate release tablet, Take 1 tablet (5 mg total) by mouth every 4 (four) hours as needed for severe pain., Disp: 120 tablet, Rfl: 0   pantoprazole (PROTONIX) 40 MG tablet, Take 1 tablet (40 mg total) by mouth daily., Disp: 30 tablet, Rfl: 2   polyethylene glycol (MIRALAX) 17 g packet,  Take 17 g by mouth daily. To prevent constipation, Disp: 30 each, Rfl: 3   potassium chloride SA (KLOR-CON M) 20 MEQ tablet, Take 1 tablet (20 mEq total) by mouth 2 (two) times daily. Dissolve tablets, Disp: 60 tablet, Rfl: 1   prochlorperazine (COMPAZINE) 10 MG tablet, Take 1 tablet (10 mg total) by mouth every 6 (six) hours as needed for nausea or vomiting., Disp: 30 tablet, Rfl: 1   senna (SENOKOT) 8.6 MG TABS tablet, Take 1-2 tablets (8.6-17.2 mg total) by mouth daily. To prevent constipation, Disp: 120 tablet, Rfl: 0 No current facility-administered medications for this visit.  Facility-Administered Medications Ordered in Other Visits:    CARBOplatin (PARAPLATIN) 750 mg in sodium chloride 0.9 % 250 mL chemo infusion, 750 mg, Intravenous, Once, Sindy Guadeloupe, MD   dexamethasone (DECADRON) 10 mg in sodium chloride 0.9 % 50 mL IVPB, 10 mg, Intravenous, Once, Sindy Guadeloupe, MD   diphenhydrAMINE (BENADRYL) injection 25  mg, 25 mg, Intravenous, Once, Sindy Guadeloupe, MD   famotidine (PEPCID) IVPB 20 mg premix, 20 mg, Intravenous, Once, Sindy Guadeloupe, MD, Last Rate: 200 mL/hr at 04/16/22 1017, 20 mg at 04/16/22 1017   heparin lock flush 100 unit/mL, 500 Units, Intracatheter, Once PRN, Sindy Guadeloupe, MD   PACLitaxel (TAXOL) 402 mg in sodium chloride 0.9 % 500 mL chemo infusion (> 70m/m2), 175 mg/m2 (Treatment Plan Recorded), Intravenous, Once, RSindy Guadeloupe MD   palonosetron (ALOXI) injection 0.25 mg, 0.25 mg, Intravenous, Once, RSindy Guadeloupe MD   pembrolizumab ([facility] 200 mg in sodium chloride 0.9 % 50 mL chemo infusion, 200 mg, Intravenous, Once, RSindy Guadeloupe MD  Physical exam:  Vitals:   04/16/22 0826  BP: (!) 133/91  Pulse: (!) 109  Resp: 18  Temp: 98.5 F (36.9 C)  TempSrc: Tympanic  SpO2: 100%  Weight: 220 lb (99.8 kg)  Height: 5' 5"$  (1.651 m)   Physical Exam Cardiovascular:     Rate and Rhythm: Regular rhythm. Tachycardia present.     Heart sounds: Normal heart sounds.  Pulmonary:     Effort: Pulmonary effort is normal.     Breath sounds: Normal breath sounds.  Abdominal:     General: Bowel sounds are normal.     Palpations: Abdomen is soft.  Skin:    General: Skin is warm and dry.  Neurological:     Mental Status: She is alert and oriented to person, place, and time.         Latest Ref Rng & Units 04/16/2022    8:13 AM  CMP  Glucose 70 - 99 mg/dL 88   BUN 6 - 20 mg/dL 6   Creatinine 0.44 - 1.00 mg/dL 0.76   Sodium 135 - 145 mmol/L 139   Potassium 3.5 - 5.1 mmol/L 3.5   Chloride 98 - 111 mmol/L 105   CO2 22 - 32 mmol/L 25   Calcium 8.9 - 10.3 mg/dL 9.0   Total Protein 6.5 - 8.1 g/dL 7.3   Total Bilirubin 0.3 - 1.2 mg/dL 0.6   Alkaline Phos 38 - 126 U/L 68   AST 15 - 41 U/L 19   ALT 0 - 44 U/L 12       Latest Ref Rng & Units 04/16/2022    8:13 AM  CBC  WBC 4.0 - 10.5 K/uL 5.8   Hemoglobin 12.0 - 15.0 g/dL 8.9   Hematocrit 36.0 - 46.0 % 25.9   Platelets 150 -  400 K/uL 375  Assessment and plan- Patient is a 47 y.o. female  with stage IV endometrioid endometrial carcinoma T2 N2 M1.  She is here for on treatment assessment prior to cycle 5 of CarboTaxol Keytruda chemotherapy  Counts okay to proceed with cycle 5 of CarboTaxol Keytruda chemotherapy today.  She will be seen by covering NP in 3 weeks for cycle 6 and I will see her back in 6 weeks for single agent Keytruda alone.  Plan to repeat CT chest abdomen and pelvis with contrast in 5 weeks.  Normocytic anemia: She has had IV iron before.  I am repeating her iron studies at this time.  Patient will continue low-dose Eliquis prophylaxis 2.5 mg twice daily  L5 compression fracture: This was noted to be pathological fracture on imaging.  Patient does not complain of any back pain.  I will reassess this and 5 weeks when I repeat her scans.  We are currently awaiting dental clearance by Dr. Orrin Brigham to proceed with Zometa and hopefully she can get that in 3 weeks time.  Chemo-induced peripheral neuropathy: Presently mild.  If it worsens with next cycle I will reduce Taxol to 135 mg/m   Visit Diagnosis 1. Endometrial cancer ([place])   2. Other iron deficiency anemia   3. Encounter for antineoplastic chemotherapy   4. Normocytic anemia   5. Chemotherapy-induced peripheral neuropathy ([place])      Dr. Randa Evens, MD, MPH [facility] [id number] 04/16/2022 10:24 AM

## 2022-04-16 NOTE — Telephone Encounter (Signed)
Sent message to [place] that pt cna get chemo . Pulse 109, and asked her to check b/P 133/91 let her settle and see if it better but still get chemo today

## 2022-04-17 ENCOUNTER — Other Ambulatory Visit: Payer: Self-pay | Admitting: *Deleted

## 2022-04-17 ENCOUNTER — Other Ambulatory Visit: Payer: Self-pay

## 2022-04-21 ENCOUNTER — Telehealth: Payer: Self-pay | Admitting: *Deleted

## 2022-04-21 ENCOUNTER — Encounter: Payer: Self-pay | Admitting: Medical Oncology

## 2022-04-21 ENCOUNTER — Ambulatory Visit
Admission: RE | Admit: 2022-04-21 | Discharge: 2022-04-21 | Disposition: A | Discharge status: Home / Self Care | Payer: BC Managed Care – PPO | Attending: Medical Oncology | Admitting: Medical Oncology

## 2022-04-21 ENCOUNTER — Inpatient Hospital Stay: Payer: BC Managed Care – PPO

## 2022-04-21 ENCOUNTER — Ambulatory Visit
Admission: RE | Admit: 2022-04-21 | Discharge: 2022-04-21 | Disposition: A | Discharge status: Home / Self Care | Payer: BC Managed Care – PPO | Source: Ambulatory Visit | Attending: Medical Oncology | Admitting: Medical Oncology

## 2022-04-21 ENCOUNTER — Inpatient Hospital Stay (HOSPITAL_BASED_OUTPATIENT_CLINIC_OR_DEPARTMENT_OTHER): Payer: BC Managed Care – PPO | Admitting: Medical Oncology

## 2022-04-21 VITALS — HR 97

## 2022-04-21 VITALS — BP 120/87 | HR 96 | Temp 97.9°F | Resp 20 | Ht 65.0 in | Wt 222.0 lb

## 2022-04-21 DIAGNOSIS — R590 Localized enlarged lymph nodes: Secondary | ICD-10-CM | POA: Diagnosis not present

## 2022-04-21 DIAGNOSIS — R051 Acute cough: Secondary | ICD-10-CM

## 2022-04-21 DIAGNOSIS — R000 Tachycardia, unspecified: Secondary | ICD-10-CM | POA: Diagnosis not present

## 2022-04-21 DIAGNOSIS — D6481 Anemia due to antineoplastic chemotherapy: Secondary | ICD-10-CM | POA: Diagnosis not present

## 2022-04-21 DIAGNOSIS — C541 Malignant neoplasm of endometrium: Secondary | ICD-10-CM

## 2022-04-21 DIAGNOSIS — R509 Fever, unspecified: Secondary | ICD-10-CM | POA: Diagnosis not present

## 2022-04-21 DIAGNOSIS — I10 Essential (primary) hypertension: Secondary | ICD-10-CM | POA: Diagnosis not present

## 2022-04-21 DIAGNOSIS — Z95828 Presence of other vascular implants and grafts: Secondary | ICD-10-CM

## 2022-04-21 DIAGNOSIS — R059 Cough, unspecified: Secondary | ICD-10-CM | POA: Diagnosis not present

## 2022-04-21 DIAGNOSIS — R911 Solitary pulmonary nodule: Secondary | ICD-10-CM | POA: Diagnosis not present

## 2022-04-21 DIAGNOSIS — D509 Iron deficiency anemia, unspecified: Secondary | ICD-10-CM | POA: Diagnosis not present

## 2022-04-21 DIAGNOSIS — K5900 Constipation, unspecified: Secondary | ICD-10-CM | POA: Diagnosis not present

## 2022-04-21 DIAGNOSIS — Z79899 Other long term (current) drug therapy: Secondary | ICD-10-CM | POA: Diagnosis not present

## 2022-04-21 DIAGNOSIS — G620 Drug-induced polyneuropathy: Secondary | ICD-10-CM | POA: Diagnosis not present

## 2022-04-21 DIAGNOSIS — Z5112 Encounter for antineoplastic immunotherapy: Secondary | ICD-10-CM | POA: Diagnosis not present

## 2022-04-21 DIAGNOSIS — R042 Hemoptysis: Secondary | ICD-10-CM | POA: Diagnosis not present

## 2022-04-21 DIAGNOSIS — I2699 Other pulmonary embolism without acute cor pulmonale: Secondary | ICD-10-CM | POA: Diagnosis not present

## 2022-04-21 DIAGNOSIS — M4856XA Collapsed vertebra, not elsewhere classified, lumbar region, initial encounter for fracture: Secondary | ICD-10-CM | POA: Diagnosis not present

## 2022-04-21 LAB — CMP (CANCER CENTER ONLY)
ALT: 28 U/L (ref 0–44)
AST: 39 U/L (ref 15–41)
Albumin: 3.3 g/dL — ABNORMAL LOW (ref 3.5–5.0)
Alkaline Phosphatase: 52 U/L (ref 38–126)
Anion gap: 11 (ref 5–15)
BUN: 15 mg/dL (ref 6–20)
CO2: 24 mmol/L (ref 22–32)
Calcium: 9 mg/dL (ref 8.9–10.3)
Chloride: 99 mmol/L (ref 98–111)
Creatinine: 0.62 mg/dL (ref 0.44–1.00)
GFR, Estimated: >60 mL/min (ref >60)
Glucose, Bld: 98 mg/dL (ref 70–99)
Potassium: 3.3 mmol/L — ABNORMAL LOW (ref 3.5–5.1)
Sodium: 134 mmol/L — ABNORMAL LOW (ref 135–145)
Total Bilirubin: 1.1 mg/dL (ref 0.3–1.2)
Total Protein: 7.4 g/dL (ref 6.5–8.1)

## 2022-04-21 LAB — CBC WITH DIFFERENTIAL (CANCER CENTER ONLY)
Abs Immature Granulocytes: 0 10*3/uL (ref 0.00–0.07)
Basophils Absolute: 0 10*3/uL (ref 0.0–0.1)
Basophils Relative: 1 %
Eosinophils Absolute: 0 10*3/uL (ref 0.0–0.5)
Eosinophils Relative: 0 %
HCT: 26 % — ABNORMAL LOW (ref 36.0–46.0)
Hemoglobin: 9.1 g/dL — ABNORMAL LOW (ref 12.0–15.0)
Immature Granulocytes: 0 %
Lymphocytes Relative: 41 %
Lymphs Abs: 1.5 10*3/uL (ref 0.7–4.0)
MCH: 27.1 pg (ref 26.0–34.0)
MCHC: 35 g/dL (ref 30.0–36.0)
MCV: 77.4 fL — ABNORMAL LOW (ref 80.0–100.0)
Monocytes Absolute: 0 10*3/uL — ABNORMAL LOW (ref 0.1–1.0)
Monocytes Relative: 0 %
Neutro Abs: 2.1 10*3/uL (ref 1.7–7.7)
Neutrophils Relative %: 58 %
Platelet Count: 329 10*3/uL (ref 150–400)
RBC: 3.36 MIL/uL — ABNORMAL LOW (ref 3.87–5.11)
RDW: 21.1 % — ABNORMAL HIGH (ref 11.5–15.5)
WBC Count: 3.6 10*3/uL — ABNORMAL LOW (ref 4.0–10.5)
nRBC: 0 % (ref 0.0–0.2)

## 2022-04-21 MED ORDER — SODIUM CHLORIDE 0.9 % IV SOLN
Freq: Once | INTRAVENOUS | Status: AC
  Filled 2022-04-21: qty 250

## 2022-04-21 MED ORDER — SODIUM CHLORIDE 0.9% FLUSH
10.0000 mL | Freq: Once | INTRAVENOUS | Status: AC
  Administered 2022-04-21: 10 mL via INTRAVENOUS
  Filled 2022-04-21: qty 10

## 2022-04-21 MED ORDER — BENZONATATE 100 MG PO CAPS: 100.0000 mg | ORAL_CAPSULE | Freq: Three times a day (TID) | ORAL | 0 refills | Status: DC | PRN

## 2022-04-21 MED ORDER — HEPARIN SOD (PORK) LOCK FLUSH 100 UNIT/ML IV SOLN
500.0000 [IU] | Freq: Once | INTRAVENOUS | Status: AC
  Administered 2022-04-21: 500 [IU]
  Filled 2022-04-21: qty 5

## 2022-04-21 NOTE — Telephone Encounter (Signed)
Patient called reporting that she tested negative for COVID and she will be at her appointment tomorrow.

## 2022-04-21 NOTE — Telephone Encounter (Signed)
Can you reach out to her?

## 2022-04-21 NOTE — Telephone Encounter (Signed)
Called patient. She will get a covid test prior to coming to clinic and msg [place] with the results. She will get the chest xray first followed up apt in smc/ labs-at 1pm today. Pt aware that if her home covid test is positive then we will not be having her come directly to the clinic.

## 2022-04-21 NOTE — Telephone Encounter (Signed)
  Called patient regarding the mychart msg pt sent.    Hello just wanted to ask the Robitussin don't feel working anymore have kind a wet cough I feel like a little cold maybe stuck but not stopping me from eating or anything having a little pain in bottom of my back been taking oxycodone it helps just wanted to touch base with also got a little dizzy a few minutes but not sure if it was because I got up to fast.. Thanks    Called patient. She will get a covid test prior to coming to clinic and msg [place] with the results. She will get the chest xray first followed up apt in smc/ labs-at 1pm today. Pt aware that if her home covid test is positive then we will not be having her come directly to the clinic.   Per sarah- cbc/metc and chest xray prior

## 2022-04-21 NOTE — Progress Notes (Signed)
Symptom Management [place] at [facility] [phone] [phone]  Patient Care Team: Center, [facility] as PCP - General (General Practice) [name], RN as Oncology Nurse Navigator   Name of the patient: Jean Morris  [id number]  03-Dec-1975   Oncological History:  stage IVb endometrioid endometrial cancer   Current Treatment: s/p cycle 5 cycle 5 of carbo/ taxol/ Beryle Flock   Date of visit: 04/21/22  Reason for Consult: Jean Morris is a 47 y.o. female who presents today for:  Cough: Has been occurring for about 2 days. Cough is wet per patient and is occasionally productive. Sputum is green/yellow in nature. She denies any SOB, chest pain, hemoptysis, fevers. No known sick contacts. Has taken a home COVID-19 test which was negative per patient. Robitussin has not been helpful.   Denies any neurologic complaints. Denies recent fevers or illnesses. Denies any easy bleeding or bruising. Reports good appetite and denies weight loss. Denies chest pain. Denies any nausea, vomiting, constipation, or diarrhea. Denies urinary complaints. Patient offers no further specific complaints today.  Wt Readings from Last 3 Encounters:  04/21/22 222 lb (100.7 kg)  04/16/22 220 lb (99.8 kg)  04/01/22 216 lb (98 kg)     PAST MEDICAL HISTORY: Past Medical History:  Diagnosis Date   Anemia    Endometrial cancer ([place])    Hypertension    Menorrhagia    Pulmonary embolism ([place])     PAST SURGICAL HISTORY:  Past Surgical History:  Procedure Laterality Date   CESAREAN SECTION  1994   COLONOSCOPY WITH PROPOFOL N/A 08/06/2021   Procedure: COLONOSCOPY WITH PROPOFOL;  Surgeon: Jonathon Bellows, MD;  Location: [facility] ENDOSCOPY;  Service: Gastroenterology;  Laterality: N/A;   ESOPHAGOGASTRODUODENOSCOPY (EGD) WITH PROPOFOL N/A 02/09/2020   Procedure: ESOPHAGOGASTRODUODENOSCOPY (EGD) WITH PROPOFOL;  Surgeon: Jonathon Bellows, MD;   Location: [facility] ENDOSCOPY;  Service: Gastroenterology;  Laterality: N/A;   IR IMAGING GUIDED PORT INSERTION  01/17/2022    HEMATOLOGY/ONCOLOGY HISTORY:  Oncology History  Endometrial cancer ([place])  01/15/2022 Initial Diagnosis   Endometrial cancer ([place])   01/15/2022 Cancer Staging   Staging form: Corpus Uteri - Carcinoma and Carcinosarcoma, AJCC 8th Edition - Clinical stage from 01/15/2022: FIGO Stage IVB (cT2, cN2, cM1) - Signed by Sindy Guadeloupe, MD on 01/15/2022 Stage prefix: Initial diagnosis   01/21/2022 -  Chemotherapy   Patient is on Treatment Plan : endometrial Carboplatin + Paclitaxel keytruda q21d       ALLERGIES:  is allergic to bee pollen and pollen extract.  MEDICATIONS:  Current Outpatient Medications  Medication Sig Dispense Refill   apixaban (ELIQUIS) 2.5 MG TABS tablet Take 1 tablet (2.5 mg total) by mouth 2 (two) times daily. 60 tablet 3   benzonatate (TESSALON) 100 MG capsule Take 1 capsule (100 mg total) by mouth 3 (three) times daily as needed for cough. 20 capsule 0   dexamethasone (DECADRON) 4 MG tablet Take 2 tablets by mouth starting the day after chemotherapy for 3 days. Take with food. 30 tablet 1   levonorgestrel (MIRENA) 20 MCG/24HR IUD 1 Intra Uterine Device (1 each total) by Intrauterine route once. 1 each 0   lidocaine-prilocaine (EMLA) cream Apply to port site as directed 30 g 3   linaclotide (LINZESS) 290 MCG CAPS capsule Take 1 capsule (290 mcg total) by mouth daily before breakfast. 30 capsule 11   LORazepam (ATIVAN) 0.5 MG tablet Take 1 tablet (0.5 mg total) by mouth every 8 (eight)  hours. 30 tablet 0   magnesium hydroxide (MILK OF MAGNESIA) 400 MG/5ML suspension Take 15 mLs by mouth daily as needed for moderate constipation. Take if no bowel movement for 2 days.     nystatin (MYCOSTATIN/NYSTOP) powder Apply 1 Application topically 3 (three) times daily. 30 g 1   ondansetron (ZOFRAN) 8 MG tablet Take 1 tablet (8 mg total) by mouth every 8 (eight)  hours as needed for nausea or vomiting. Start on the third day after chemotherapy. 30 tablet 1   oxyCODONE (OXY IR/ROXICODONE) 5 MG immediate release tablet Take 1 tablet (5 mg total) by mouth every 4 (four) hours as needed for severe pain. 120 tablet 0   pantoprazole (PROTONIX) 40 MG tablet Take 1 tablet (40 mg total) by mouth daily. 30 tablet 2   polyethylene glycol (MIRALAX) 17 g packet Take 17 g by mouth daily. To prevent constipation 30 each 3   potassium chloride SA (KLOR-CON M) 20 MEQ tablet Take 1 tablet (20 mEq total) by mouth 2 (two) times daily. Dissolve tablets 60 tablet 1   prochlorperazine (COMPAZINE) 10 MG tablet Take 1 tablet (10 mg total) by mouth every 6 (six) hours as needed for nausea or vomiting. 30 tablet 1   senna (SENOKOT) 8.6 MG TABS tablet Take 1-2 tablets (8.6-17.2 mg total) by mouth daily. To prevent constipation 120 tablet 0   No current facility-administered medications for this visit.   Facility-Administered Medications Ordered in Other Visits  Medication Dose Route Frequency Provider Last Rate Last Admin   0.9 %  sodium chloride infusion   Intravenous Once Hughie Closs, PA-C 999 mL/hr at 04/21/22 1347 New Bag at 04/21/22 1347   heparin lock flush 100 unit/mL  500 Units Intracatheter Once Marilena Trevathan M, [place]        VITAL SIGNS: BP 120/87 (Patient Position: Sitting)   Pulse 96   Temp 97.9 F (36.6 C) (Tympanic)   Resp 20   Ht '5\' 5"'$  (1.651 m)   Wt 222 lb (100.7 kg)   SpO2 100%   BMI 36.94 kg/m  Filed Weights   04/21/22 1300  Weight: 222 lb (100.7 kg)    Estimated body mass index is 36.94 kg/m as calculated from the following:   Height as of this encounter: '5\' 5"'$  (1.651 m).   Weight as of this encounter: 222 lb (100.7 kg).  LABS: CBC:    Component Value Date/Time   WBC 3.6 (L) 04/21/2022 1311   WBC 5.8 04/16/2022 0813   HGB 9.1 (L) 04/21/2022 1311   HGB 10.1 (L) 12/25/2021 0907   HCT 26.0 (L) 04/21/2022 1311   HCT 32.1 (L) 12/25/2021  0907   PLT 329 04/21/2022 1311   PLT 445 12/25/2021 0907   MCV 77.4 (L) 04/21/2022 1311   MCV 67 (L) 12/25/2021 0907   MCV 57 (L) 06/15/2013 1946   NEUTROABS 2.1 04/21/2022 1311   NEUTROABS 2.7 12/25/2021 0907   LYMPHSABS 1.5 04/21/2022 1311   LYMPHSABS 1.6 12/25/2021 0907   MONOABS 0.0 (L) 04/21/2022 1311   EOSABS 0.0 04/21/2022 1311   EOSABS 0.1 12/25/2021 0907   BASOSABS 0.0 04/21/2022 1311   BASOSABS 0.0 12/25/2021 0907   Comprehensive Metabolic Panel:    Component Value Date/Time   NA 134 (L) 04/21/2022 1311   NA 139 10/23/2021 0940   NA 136 06/15/2013 1946   K 3.3 (L) 04/21/2022 1311   K 3.4 (L) 06/15/2013 1946   CL 99 04/21/2022 1311   CL 104 06/15/2013  1946   CO2 24 04/21/2022 1311   CO2 25 06/15/2013 1946   BUN 15 04/21/2022 1311   BUN 7 10/23/2021 0940   BUN 7 06/15/2013 1946   CREATININE 0.62 04/21/2022 1311   CREATININE 0.94 06/15/2013 1946   GLUCOSE 98 04/21/2022 1311   GLUCOSE 110 (H) 06/15/2013 1946   CALCIUM 9.0 04/21/2022 1311   CALCIUM 8.8 06/15/2013 1946   AST 39 04/21/2022 1311   ALT 28 04/21/2022 1311   ALT 16 04/08/2013 1258   ALKPHOS 52 04/21/2022 1311   ALKPHOS 81 04/08/2013 1258   BILITOT 1.1 04/21/2022 1311   PROT 7.4 04/21/2022 1311   PROT 7.6 11/19/2016 1053   PROT 8.6 (H) 04/08/2013 1258   ALBUMIN 3.3 (L) 04/21/2022 1311   ALBUMIN 3.6 11/19/2016 1053   ALBUMIN 3.1 (L) 04/08/2013 1258    RADIOGRAPHIC STUDIES: No results found.  PERFORMANCE STATUS (ECOG) : 2 - Symptomatic, <50% confined to bed  Review of Systems Unless otherwise noted, a complete review of systems is negative.  Physical Exam General: NAD Cardiovascular: tachycardia with regular rhythm Pulmonary: clear ant fields without wheezing, rhonchi, rales  Abdomen: soft, nontender, + bowel sounds GU: no suprapubic tenderness Extremities: no edema, no joint deformities Skin: no rashes Neurological: Weakness but otherwise nonfocal  Assessment and Plan- Patient is a 47  y.o. female    Encounter Diagnoses  Name Primary?   Acute cough Yes   Endometrial cancer ([place])    New to me. Likely viral given symptoms. Home COVID-19 testing is negative. She has mild neutropenia: WBC 3.6, ANC 2.1. Will obtain a chest x ray given her current treatment regimen and symptoms. Will administer her some IVF given her tachycardia- slightly worse than her baseline which is around 110. 1L IVF given and HR improved to 96. Symptomatically she improved a bit as well. Tessalon sent to pharmacy. Reviewed red flags: fever, SOB, hemoptysis. Follow up with PCP or our office as needed for cough. Continue previously planned follow up.    Patient expressed understanding and was in agreement with this plan. She also understands that She can call clinic at any time with any questions, concerns, or complaints.   Thank you for allowing me to participate in the care of this very pleasant patient.   Time Total: 25  Visit consisted of counseling and education dealing with the complex and emotionally intense issues of symptom management in the setting of serious illness.Greater than 50%  of this time was spent counseling and coordinating care related to the above assessment and plan.  Signed by: Nelwyn Salisbury, PA-C

## 2022-04-22 ENCOUNTER — Other Ambulatory Visit: Payer: Self-pay | Admitting: *Deleted

## 2022-04-22 DIAGNOSIS — C541 Malignant neoplasm of endometrium: Secondary | ICD-10-CM

## 2022-04-22 MED ORDER — NYSTATIN 100000 UNIT/GM EX POWD: 1.0000 | Freq: Three times a day (TID) | CUTANEOUS | 3 refills | Status: DC

## 2022-04-23 ENCOUNTER — Encounter: Payer: Self-pay | Admitting: Obstetrics and Gynecology

## 2022-04-23 ENCOUNTER — Inpatient Hospital Stay (HOSPITAL_BASED_OUTPATIENT_CLINIC_OR_DEPARTMENT_OTHER): Payer: BC Managed Care – PPO | Admitting: Obstetrics and Gynecology

## 2022-04-23 VITALS — BP 127/95 | HR 117 | Temp 97.6°F | Resp 20 | Wt 214.3 lb

## 2022-04-23 DIAGNOSIS — R509 Fever, unspecified: Secondary | ICD-10-CM | POA: Diagnosis not present

## 2022-04-23 DIAGNOSIS — D6481 Anemia due to antineoplastic chemotherapy: Secondary | ICD-10-CM | POA: Diagnosis not present

## 2022-04-23 DIAGNOSIS — R042 Hemoptysis: Secondary | ICD-10-CM | POA: Diagnosis not present

## 2022-04-23 DIAGNOSIS — R911 Solitary pulmonary nodule: Secondary | ICD-10-CM | POA: Diagnosis not present

## 2022-04-23 DIAGNOSIS — M4856XA Collapsed vertebra, not elsewhere classified, lumbar region, initial encounter for fracture: Secondary | ICD-10-CM | POA: Diagnosis not present

## 2022-04-23 DIAGNOSIS — K5900 Constipation, unspecified: Secondary | ICD-10-CM | POA: Diagnosis not present

## 2022-04-23 DIAGNOSIS — R059 Cough, unspecified: Secondary | ICD-10-CM | POA: Diagnosis not present

## 2022-04-23 DIAGNOSIS — R000 Tachycardia, unspecified: Secondary | ICD-10-CM | POA: Diagnosis not present

## 2022-04-23 DIAGNOSIS — I10 Essential (primary) hypertension: Secondary | ICD-10-CM | POA: Diagnosis not present

## 2022-04-23 DIAGNOSIS — I2699 Other pulmonary embolism without acute cor pulmonale: Secondary | ICD-10-CM | POA: Diagnosis not present

## 2022-04-23 DIAGNOSIS — Z5112 Encounter for antineoplastic immunotherapy: Secondary | ICD-10-CM | POA: Diagnosis not present

## 2022-04-23 DIAGNOSIS — Z79899 Other long term (current) drug therapy: Secondary | ICD-10-CM | POA: Diagnosis not present

## 2022-04-23 DIAGNOSIS — G620 Drug-induced polyneuropathy: Secondary | ICD-10-CM | POA: Diagnosis not present

## 2022-04-23 DIAGNOSIS — R590 Localized enlarged lymph nodes: Secondary | ICD-10-CM | POA: Diagnosis not present

## 2022-04-23 DIAGNOSIS — C541 Malignant neoplasm of endometrium: Secondary | ICD-10-CM | POA: Diagnosis not present

## 2022-04-23 DIAGNOSIS — D509 Iron deficiency anemia, unspecified: Secondary | ICD-10-CM | POA: Diagnosis not present

## 2022-04-23 NOTE — Progress Notes (Signed)
Gynecologic Oncology Consult Visit   Referring Provider: [name] & Dr Tasia Catchings  Chief Complaint: Endometrial adenocarcinoma, stage IVB  Subjective:  Jean Morris is a 47 y.o. female who is seen in consultation from [name] for irregular bleeding for many years, diagnosed with Stage IVb endometrioid endometrial cancer currently s/p 5 cycles of NA carboplatin with paclitaxel and keytruda added at cycle 2 who returns to clinic for consideration of completion surgery.   No new complaints today. Denies significant back pain.  Still has issues with constipation. No vaginal bleeding.   Treatment Summary:  01/21/22- D1C1- carboplatin 02/12/22- D1C2 carbo-paclitaxel-pembrolizumab 03/05/22- D1C3 carbo-paclitaxel-pembrolizumab 03/26/22- D1C4 carbo-paclitaxel-pembrolizumab 04/16/22- D1C5 carbo-paclitaxel-pembrolizumab  03/17/22- CT Chest Abdomen Pelvis W Contrast IMPRESSION: 1. Persistent heterogeneous irregularity of the endometrium with uterine enlargement compatible with known endometrial malignancy.  2. New heterogeneity of the 2.8 cm left ovarian lesion, nonspecific but possibly reflecting metastatic disease. Consider further evaluation with pelvic ultrasound. 3. Overall decreased retrocrural and retroperitoneal adenopathy. However, there is interval increase in size and adjacent infiltrative/desmoplastic stranding adjacent to a centrally necrotic left periaortic lymph node/lymph node conglomerate. 4. Continued decrease in size of the posterior right lower lobe pulmonary nodule. 5. New pathologic compression deformity of the L2 vertebral body. 6. Diffuse hepatic steatosis. 7. Mild diffuse bronchial wall thickening with mosaic attenuation of the lungs, suggestive of small airways disease.  She saw Dr Janese Banks on 03/26/22 for results. She was asymptomatic of back pain at that time. Dr Janese Banks considered palliative radiation, however, patient was asymptomatic so it was held. She is awaiting dental clearance for  possible bisphosphonates.   Gynecologic Oncology History:  Patient presented to her OB/GYN for irregular bleeding for several months.  She was thought to have an IUD in place and her bleeding was attempted to be controlled with progesterone agents.  Medical history significant for PE in [facility] in 02/2015  12/25/21- CT C/A/P-  for left lower quadrant abdominal pain showed enlarged uterus with thickened endometrium, heterogeneous and irregular extending to the fundal myometrium.  Retroperitoneal and retrocrural adenopathy.  Faint lucent lesion in left L2 vertebrae suspicious for metastasis.  Posterior right lower lobe subpleural nodule measuring 10 mm with surrounding groundglass opacity; suspicious for metastasis though was present on 08/2015 CT but measured 5 mm.   Pelvic ultrasound- Uterus-19 x 13.3 x 15 cm = volume 1974 mls.  Distinct endometrium not visible.  Heterogeneous area which may be surrounded by myometrium may be as much is 7 cm in greatest thickness. Endometrium-thickness, perhaps cystic is 75 mm.  Markedly heterogeneous with signs of internal flow. Right ovary-not visualized Left ovary-5.8 x 3.1 x 4.2 cm.  Simple dominant follicle measuring up to 3.5 cm accounts for much of the left ovarian dimension.  Pulse Doppler evaluation shows low resistance arterial and venous flow within the left ovary Other-small free fluid in the pelvis.  Endometrial biopsy- -endometrioid adenocarcinoma, FIGO grade 2  She was started on norethindrone by [name].   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 She was taking oral contraceptives at that time with increased dosing d/t heavy menstrual periods. Additionally, hx of iron deficiency and has taken oral iron.   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  BRAF V600E not present. The tumor cells are NEGATIVE for Her2 (1+). Estrogen Receptor:       POSITIVE, 90%, MODERATE STAINING  INTENSITY Progesterone Receptor:   POSITIVE, 80%, STRONG STAINING INTENSITY   Problem List: Patient Active  Problem List   Diagnosis Date Noted   Hypokalemia 04/01/2022   Endometrial cancer ([name]) 01/15/2022   Iron deficiency anemia 01/15/2022   Morbid obesity with BMI of 60.0-69.9, adult ([name]) 04/18/2015   Abnormal uterine bleeding 04/18/2015   Anemia 04/18/2015   Pulmonary embolism ([name]) 03/11/2015    Past Medical History: Past Medical History:  Diagnosis Date   Anemia    Endometrial cancer ([name])    Hypertension    Menorrhagia    Pulmonary embolism ([name])     Past Surgical History: Past Surgical History:  Procedure Laterality Date   CESAREAN SECTION  1994   COLONOSCOPY WITH PROPOFOL N/A 08/06/2021   Procedure: COLONOSCOPY WITH PROPOFOL;  Surgeon: Jonathon Bellows, MD;  Location: [facility] ENDOSCOPY;  Service: Gastroenterology;  Laterality: N/A;   ESOPHAGOGASTRODUODENOSCOPY (EGD) WITH PROPOFOL N/A 02/09/2020   Procedure: ESOPHAGOGASTRODUODENOSCOPY (EGD) WITH PROPOFOL;  Surgeon: Jonathon Bellows, MD;  Location: [facility] ENDOSCOPY;  Service: Gastroenterology;  Laterality: N/A;   IR IMAGING GUIDED PORT INSERTION  01/17/2022    Past Gynecologic History:  Menarche: age 14, last 5 days History of OCP/HRT use: yes Last pap:  03/14/21- ASCUS, HPV negative History of STDs: The patient denies history of sexually transmitted disease. Sexually active: yes; not currently  OB History:  OB History  Gravida Para Term Preterm AB Living  '3 3 3     3  '$ SAB IAB Ectopic Multiple Live Births          3    # Outcome Date GA Lbr Len/2nd Weight Sex Delivery Anes PTL Lv  3 Term     M CS-LTranv   LIV  2 Term     M Vag-Spont   LIV  1 Term     F Vag-Spont   LIV   Family History: Family History  Problem Relation Age of Onset   Hypertension Mother    Stroke Mother    Heart attack Mother    Cancer Father    Hypertension Father    Cancer Sister    Cancer Sister    Cancer Brother    Hypertension Other     Social History: Social History   Socioeconomic History   Marital status: Soil scientist    Spouse name: Not on file   Number of children: Not on file   Years of education: Not on file   Highest education level: Not on file  Occupational History   Not on file  Tobacco Use   Smoking status: Never   Smokeless tobacco: Never  Vaping Use   Vaping Use: Never used  Substance and Sexual Activity   Alcohol use: No   Drug use: No   Sexual activity: Not Currently    Birth control/protection: I.U.D.    Comment: patient states MD said did not see in CT today 01/17/2022  Other Topics Concern   Not on file  Social History Narrative   Lives with [facility], partner, and no pets.   Social Determinants of Health   Financial Resource Strain: Not on file  Food Insecurity: Not on file  Transportation Needs: Not on file  Physical Activity: Not on file  Stress: Not on file  Social Connections: Not on file  Intimate Partner Violence: Not on file   Allergies: Allergies  Allergen Reactions   Bee Pollen Nausea And Vomiting    Itchy, swelling, watery eyes, runny nose.   Pollen Extract Nausea And Vomiting    Itchy, swelling, watery eyes, runny nose.   Current Medications: Current Outpatient Medications  Medication Sig Dispense Refill   apixaban (ELIQUIS) 2.5 MG TABS tablet Take 1 tablet (2.5 mg total) by mouth 2 (two) times daily. 60 tablet 3   benzonatate (TESSALON) 100 MG capsule Take 1 capsule (100 mg total) by mouth 3 (three) times daily as needed for cough. 20 capsule 0   dexamethasone (DECADRON) 4 MG tablet Take 2 tablets by mouth starting the day after chemotherapy for 3 days. Take with food. 30 tablet 1   levonorgestrel (MIRENA) 20 MCG/24HR IUD 1 Intra Uterine Device (1 each total) by Intrauterine route once. 1 each 0   lidocaine-prilocaine (EMLA) cream Apply to port site as directed 30 g 3   linaclotide (LINZESS) 290 MCG CAPS capsule Take 1 capsule (290 mcg total) by mouth daily  before breakfast. 30 capsule 11   LORazepam (ATIVAN) 0.5 MG tablet Take 1 tablet (0.5 mg total) by mouth every 8 (eight) hours. 30 tablet 0   magnesium hydroxide (MILK OF MAGNESIA) 400 MG/5ML suspension Take 15 mLs by mouth daily as needed for moderate constipation. Take if no bowel movement for 2 days.     nystatin (MYCOSTATIN/NYSTOP) powder Apply 1 Application topically 3 (three) times daily. 30 g 3   ondansetron (ZOFRAN) 8 MG tablet Take 1 tablet (8 mg total) by mouth every 8 (eight) hours as needed for nausea or vomiting. Start on the third day after chemotherapy. 30 tablet 1   oxyCODONE (OXY IR/ROXICODONE) 5 MG immediate release tablet Take 1 tablet (5 mg total) by mouth every 4 (four) hours as needed for severe pain. 120 tablet 0   pantoprazole (PROTONIX) 40 MG tablet Take 1 tablet (40 mg total) by mouth daily. 30 tablet 2   polyethylene glycol (MIRALAX) 17 g packet Take 17 g by mouth daily. To prevent constipation 30 each 3   potassium chloride SA (KLOR-CON M) 20 MEQ tablet Take 1 tablet (20 mEq total) by mouth 2 (two) times daily. Dissolve tablets 60 tablet 1   prochlorperazine (COMPAZINE) 10 MG tablet Take 1 tablet (10 mg total) by mouth every 6 (six) hours as needed for nausea or vomiting. 30 tablet 1   senna (SENOKOT) 8.6 MG TABS tablet Take 1-2 tablets (8.6-17.2 mg total) by mouth daily. To prevent constipation 120 tablet 0   No current facility-administered medications for this visit.    Review of Systems General:  weak Skin: no complaints Eyes: no complaints HEENT: no complaints Breasts: no complaints Pulmonary: cough Cardiac: no complaints Gastrointestinal: abdominal pain, constipation Genitourinary/Sexual: no complaints Ob/Gyn: no complaints Musculoskeletal: no complaints Hematology: no complaints Neurologic/Psych: no complaints  Objective:  Physical Examination:  BP (!) 127/95   Pulse (!) 117   Temp 97.6 F (36.4 C)   Resp 20   Wt 214 lb 4.8 oz (97.2 kg)   SpO2  100%   BMI 35.66 kg/m     ECOG Performance Status: 3 - Symptomatic, >50% confined to bed  GENERAL: Obese. Well appearing. No acute distress. Arrives in wheelchair. Accompanied by daughter, Jean [place] 'DP' HEENT:  Sclerae anicteric.   NODES:  No cervical, supraclavicular, inguinal, or axillary lymphadenopathy palpated.  LUNGS:  Clear to auscultation bilaterally.  No wheezes or rhonchi. HEART:  tachycardia. Regular. No murmur appreciated. ABDOMEN:  soft. Firm abdominal mass palpated at midline up to level of umbilicus. LLQ tenderness.  MSK:  No focal spinal tenderness to palpation. Full range of motion bilaterally in the upper extremities. EXTREMITIES:  No peripheral edema.   SKIN:  Clear with no obvious rashes or  skin changes. NEURO:  Nonfocal. Well oriented.  Appropriate affect.  Pelvic: Exam Chaperoned by NP EGBUS: no lesions Cervix: deviated to the left. On palpation smooth. Vagina: normal, no discharge present Uterus: enlarged. Sixteen week size Adnexa: no palpable masses Rectovaginal: deferred  Labs Lab Results  Component Value Date   WBC 3.6 (L) 04/21/2022   HGB 9.1 (L) 04/21/2022   HCT 26.0 (L) 04/21/2022   MCV 77.4 (L) 04/21/2022   PLT 329 04/21/2022     Chemistry      Component Value Date/Time   NA 134 (L) 04/21/2022 1311   NA 139 10/23/2021 0940   NA 136 06/15/2013 1946   K 3.3 (L) 04/21/2022 1311   K 3.4 (L) 06/15/2013 1946   CL 99 04/21/2022 1311   CL 104 06/15/2013 1946   CO2 24 04/21/2022 1311   CO2 25 06/15/2013 1946   BUN 15 04/21/2022 1311   BUN 7 10/23/2021 0940   BUN 7 06/15/2013 1946   CREATININE 0.62 04/21/2022 1311   CREATININE 0.94 06/15/2013 1946      Component Value Date/Time   CALCIUM 9.0 04/21/2022 1311   CALCIUM 8.8 06/15/2013 1946   ALKPHOS 52 04/21/2022 1311   ALKPHOS 81 04/08/2013 1258   AST 39 04/21/2022 1311   ALT 28 04/21/2022 1311   ALT 16 04/08/2013 1258   BILITOT 1.1 04/21/2022 1311         Assessment:  Jean Morris is a 47 y.o. female with PMB, anemia and large uterus diagnosed with figo grade 2 endometrial cancer on endometrial biopsy 11/23.  Stage IVB based on suspicious lung lesion, L2 vertebral lesion, significantly enlarged left aortic adenopathy consistent with metastatic disease on imaging.   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  BRAF V600E not present. The tumor cells are NEGATIVE for Her2 (1+). Estrogen Receptor:       POSITIVE, 90%, MODERATE STAINING INTENSITY Progesterone Receptor:   POSITIVE, 80%, STRONG STAINING INTENSITY  Decision made to treat with carboplatin/taxol and Pembrolizumab and has gotten 5 cycles.  Some response on CT scan 1/24 after 3 cycles with decrease in lung nodule and left PA node, but persistent metastatic disease, including new lesion in left adnexa.  Body mass index is 35.66 kg/m.  History of PE  Medical co-morbidities complicating care: HTN, Body mass index is 35.66 kg/m., prior surgery, PE while taking OCs. Plan:   Problem List Items Addressed This Visit       Genitourinary   Endometrial cancer [facility]) - Primary    Discussed with Dr Janese Banks her Medical Oncologist and with patient and daughter.  Would recommend that she have cycle 6 of treatment as scheduled and then PET/CT to assess extent of disease.  Would consider completion surgery to remove the uterus and adnexa if she has continued to respond.  However, if there is still disease in the lung/bone or other distant sites am no sure how much she would benefit from surgery as she does not have vaginal bleeding or pain currently.  Also is not having back pain related to the vertebral metastasis.  So no imperative to radiate this area presently.  If she has persistent distant disease it would be an option to continue Pembrolizumab and add Lenvatanib.  In view of the endometrioid histology and positive ER/PR expression she could also be a candidate for a hormonal regimen such  as alternating Megace and Tamoxifen or Evrolimus/Letrozole.    She is tachycardic today, but this  has been a consistent finding.  She is taking prophylatic Eliquis in view of high risk of VTE and prior PE on OCs.  She had negative CT PE 02/16/22 that was negative.   The patient's diagnosis, an outline of the further diagnostic and laboratory studies which will be required, the recommendation for surgery, and alternatives were discussed with her and her accompanying family members.  All questions were answered to their satisfaction.  I personally interviewed and examined the patient. Agreed with the above/below plan of care. I have directly contributed to assessment and plan of care of this patient and educated and discussed with patient and family.  Mellody Drown, MD  CC:  Referring Provider [name]

## 2022-04-28 ENCOUNTER — Other Ambulatory Visit: Payer: Self-pay

## 2022-04-30 ENCOUNTER — Other Ambulatory Visit: Payer: Self-pay | Admitting: *Deleted

## 2022-04-30 MED ORDER — LORAZEPAM 0.5 MG PO TABS: 0.5000 mg | ORAL_TABLET | Freq: Three times a day (TID) | ORAL | 0 refills | Status: AC

## 2022-05-06 ENCOUNTER — Telehealth: Payer: Self-pay | Admitting: *Deleted

## 2022-05-06 ENCOUNTER — Other Ambulatory Visit: Payer: Self-pay | Admitting: *Deleted

## 2022-05-06 MED ORDER — APIXABAN 2.5 MG PO TABS: 2.5000 mg | ORAL_TABLET | Freq: Two times a day (BID) | ORAL | 3 refills | Status: DC

## 2022-05-06 MED FILL — Fosaprepitant Dimeglumine For IV Infusion 150 MG (Base Eq): INTRAVENOUS | Qty: 5 | Status: AC

## 2022-05-06 MED FILL — Dexamethasone Sodium Phosphate Inj 100 MG/10ML: INTRAMUSCULAR | Qty: 1 | Status: AC

## 2022-05-06 NOTE — Telephone Encounter (Signed)
Patient requested RF on Eliquis. Script sent to patient's preferred pharmacy.

## 2022-05-07 ENCOUNTER — Inpatient Hospital Stay (HOSPITAL_BASED_OUTPATIENT_CLINIC_OR_DEPARTMENT_OTHER): Payer: BC Managed Care – PPO | Admitting: Nurse Practitioner

## 2022-05-07 ENCOUNTER — Inpatient Hospital Stay: Payer: BC Managed Care – PPO

## 2022-05-07 ENCOUNTER — Encounter: Payer: Self-pay | Admitting: Nurse Practitioner

## 2022-05-07 ENCOUNTER — Inpatient Hospital Stay: Payer: BC Managed Care – PPO | Attending: Obstetrics and Gynecology

## 2022-05-07 VITALS — BP 135/94 | HR 96 | Temp 96.8°F | Resp 17 | Wt 214.0 lb

## 2022-05-07 DIAGNOSIS — E876 Hypokalemia: Secondary | ICD-10-CM

## 2022-05-07 DIAGNOSIS — M542 Cervicalgia: Secondary | ICD-10-CM | POA: Insufficient documentation

## 2022-05-07 DIAGNOSIS — Z5112 Encounter for antineoplastic immunotherapy: Secondary | ICD-10-CM

## 2022-05-07 DIAGNOSIS — C541 Malignant neoplasm of endometrium: Secondary | ICD-10-CM | POA: Diagnosis not present

## 2022-05-07 DIAGNOSIS — D508 Other iron deficiency anemias: Secondary | ICD-10-CM

## 2022-05-07 DIAGNOSIS — G620 Drug-induced polyneuropathy: Secondary | ICD-10-CM | POA: Insufficient documentation

## 2022-05-07 DIAGNOSIS — Z86711 Personal history of pulmonary embolism: Secondary | ICD-10-CM | POA: Insufficient documentation

## 2022-05-07 DIAGNOSIS — I10 Essential (primary) hypertension: Secondary | ICD-10-CM | POA: Insufficient documentation

## 2022-05-07 DIAGNOSIS — C772 Secondary and unspecified malignant neoplasm of intra-abdominal lymph nodes: Secondary | ICD-10-CM | POA: Diagnosis not present

## 2022-05-07 DIAGNOSIS — Z79899 Other long term (current) drug therapy: Secondary | ICD-10-CM | POA: Insufficient documentation

## 2022-05-07 DIAGNOSIS — M25511 Pain in right shoulder: Secondary | ICD-10-CM | POA: Insufficient documentation

## 2022-05-07 DIAGNOSIS — D509 Iron deficiency anemia, unspecified: Secondary | ICD-10-CM | POA: Diagnosis not present

## 2022-05-07 DIAGNOSIS — Z5111 Encounter for antineoplastic chemotherapy: Secondary | ICD-10-CM | POA: Diagnosis not present

## 2022-05-07 DIAGNOSIS — Z86718 Personal history of other venous thrombosis and embolism: Secondary | ICD-10-CM | POA: Insufficient documentation

## 2022-05-07 DIAGNOSIS — D649 Anemia, unspecified: Secondary | ICD-10-CM | POA: Diagnosis not present

## 2022-05-07 DIAGNOSIS — Z7901 Long term (current) use of anticoagulants: Secondary | ICD-10-CM | POA: Diagnosis not present

## 2022-05-07 LAB — COMPREHENSIVE METABOLIC PANEL
ALT: 10 U/L (ref 0–44)
AST: 19 U/L (ref 15–41)
Albumin: 3 g/dL — ABNORMAL LOW (ref 3.5–5.0)
Alkaline Phosphatase: 63 U/L (ref 38–126)
Anion gap: 7 (ref 5–15)
BUN: 6 mg/dL (ref 6–20)
CO2: 25 mmol/L (ref 22–32)
Calcium: 8.9 mg/dL (ref 8.9–10.3)
Chloride: 102 mmol/L (ref 98–111)
Creatinine, Ser: 0.72 mg/dL (ref 0.44–1.00)
GFR, Estimated: >60 mL/min (ref >60)
Glucose, Bld: 89 mg/dL (ref 70–99)
Potassium: 3.5 mmol/L (ref 3.5–5.1)
Sodium: 134 mmol/L — ABNORMAL LOW (ref 135–145)
Total Bilirubin: 0.4 mg/dL (ref 0.3–1.2)
Total Protein: 7.5 g/dL (ref 6.5–8.1)

## 2022-05-07 LAB — CBC WITH DIFFERENTIAL/PLATELET
Abs Immature Granulocytes: 0.04 10*3/uL (ref 0.00–0.07)
Basophils Absolute: 0 10*3/uL (ref 0.0–0.1)
Basophils Relative: 0 %
Eosinophils Absolute: 0 10*3/uL (ref 0.0–0.5)
Eosinophils Relative: 0 %
HCT: 24.3 % — ABNORMAL LOW (ref 36.0–46.0)
Hemoglobin: 8.4 g/dL — ABNORMAL LOW (ref 12.0–15.0)
Immature Granulocytes: 1 %
Lymphocytes Relative: 49 %
Lymphs Abs: 2.1 10*3/uL (ref 0.7–4.0)
MCH: 28.3 pg (ref 26.0–34.0)
MCHC: 34.6 g/dL (ref 30.0–36.0)
MCV: 81.8 fL (ref 80.0–100.0)
Monocytes Absolute: 0.3 10*3/uL (ref 0.1–1.0)
Monocytes Relative: 8 %
Neutro Abs: 1.8 10*3/uL (ref 1.7–7.7)
Neutrophils Relative %: 42 %
Platelets: 325 10*3/uL (ref 150–400)
RBC: 2.97 MIL/uL — ABNORMAL LOW (ref 3.87–5.11)
RDW: 21.3 % — ABNORMAL HIGH (ref 11.5–15.5)
WBC: 4.2 10*3/uL (ref 4.0–10.5)
nRBC: 0 % (ref 0.0–0.2)

## 2022-05-07 MED ORDER — HEPARIN SOD (PORK) LOCK FLUSH 100 UNIT/ML IV SOLN
500.0000 [IU] | Freq: Once | INTRAVENOUS | Status: AC | PRN
  Filled 2022-05-07: qty 5

## 2022-05-07 MED ORDER — DOXYCYCLINE HYCLATE 100 MG PO TABS: 100.0000 mg | ORAL_TABLET | Freq: Two times a day (BID) | ORAL | 0 refills | Status: AC

## 2022-05-07 MED ORDER — SODIUM CHLORIDE 0.9 % IV SOLN
750.0000 mg | Freq: Once | INTRAVENOUS | Status: AC
  Administered 2022-05-07: 750 mg via INTRAVENOUS
  Filled 2022-05-07: qty 75

## 2022-05-07 MED ORDER — FAMOTIDINE IN NACL 20-0.9 MG/50ML-% IV SOLN
20.0000 mg | Freq: Once | INTRAVENOUS | Status: AC
  Administered 2022-05-07: 20 mg via INTRAVENOUS
  Filled 2022-05-07: qty 50

## 2022-05-07 MED ORDER — SODIUM CHLORIDE 0.9 % IV SOLN
10.0000 mg | Freq: Once | INTRAVENOUS | Status: AC
  Administered 2022-05-07: 10 mg via INTRAVENOUS
  Filled 2022-05-07: qty 10

## 2022-05-07 MED ORDER — SODIUM CHLORIDE 0.9 % IV SOLN
Freq: Once | INTRAVENOUS | Status: AC
  Filled 2022-05-07: qty 250

## 2022-05-07 MED ORDER — SODIUM CHLORIDE 0.9 % IV SOLN
200.0000 mg | Freq: Once | INTRAVENOUS | Status: AC
  Administered 2022-05-07: 200 mg via INTRAVENOUS
  Filled 2022-05-07: qty 8

## 2022-05-07 MED ORDER — SODIUM CHLORIDE 0.9 % IV SOLN
150.0000 mg | Freq: Once | INTRAVENOUS | Status: AC
  Administered 2022-05-07: 150 mg via INTRAVENOUS
  Filled 2022-05-07: qty 150

## 2022-05-07 MED ORDER — HEPARIN SOD (PORK) LOCK FLUSH 100 UNIT/ML IV SOLN
INTRAVENOUS | Status: AC
  Administered 2022-05-07: 500 [IU]
  Filled 2022-05-07: qty 5

## 2022-05-07 MED ORDER — SODIUM CHLORIDE 0.9 % IV SOLN
135.0000 mg/m2 | Freq: Once | INTRAVENOUS | Status: AC
  Administered 2022-05-07: 312 mg via INTRAVENOUS
  Filled 2022-05-07: qty 52

## 2022-05-07 MED ORDER — DIPHENHYDRAMINE HCL 50 MG/ML IJ SOLN
25.0000 mg | Freq: Once | INTRAMUSCULAR | Status: AC
  Administered 2022-05-07: 25 mg via INTRAVENOUS
  Filled 2022-05-07: qty 1

## 2022-05-07 MED ORDER — PALONOSETRON HCL INJECTION 0.25 MG/5ML
0.2500 mg | Freq: Once | INTRAVENOUS | Status: AC
  Administered 2022-05-07: 0.25 mg via INTRAVENOUS
  Filled 2022-05-07: qty 5

## 2022-05-07 MED ORDER — ZOLEDRONIC ACID 4 MG/100ML IV SOLN
4.0000 mg | INTRAVENOUS | Status: DC
  Administered 2022-05-07: 4 mg via INTRAVENOUS
  Filled 2022-05-07: qty 100

## 2022-05-07 NOTE — Progress Notes (Signed)
Last hCG was drawn 04/16/22. Pt reports that she has not been sexually active since this test was taken. Per Beckey Rutter NP okay to proceed with treatment using 04/16/22 hCG test. Per Beckey Rutter NP pt to also receive Zometa today.

## 2022-05-07 NOTE — Patient Instructions (Signed)

## 2022-05-07 NOTE — Progress Notes (Signed)
Nutrition Follow-up:  Patient with endometrial stage IV cancer.  Patient receiving carbotaxol and [place].    Met with patient during infusion.  Reports that her appetite is good.  She denies nutrition impact symptoms.  She takes miralax and MOM for constipation.  Eating 2-3 meals a day.   Medications: reviewed  Labs: reviewed  Anthropometrics:   Weight 214 lb today  220 lb on 2/21 217 lb on 1/31 244 lb on 12/6   NUTRITION DIAGNOSIS: Inadequate oral intake improved   INTERVENTION:  Answered further questions regarding foods she can eat during treatment Encouraged lean protein foods and fruits, vegetables and whole grains to be included in diet.  Encouraged movement (ie walking) goal of 30 minutes each day to help with fatigue.  Can start small and build up to this amount.  Contact information provided and will contact RD if needed in the future    MONITORING, EVALUATION, GOAL: weight trends, intake   NEXT VISIT: no follow-up Patient will contact RD if needed  Jean Morris, [place], [place] Registered Dietitian [phone]

## 2022-05-07 NOTE — Progress Notes (Signed)
Hematology/Oncology Consult Note [facility]  [phone] [phone]  Patient Care Team: Center, [facility] as PCP - General (General Practice) [name], RN as Oncology Nurse Navigator   Name of the patient: Jean Morris  [id number]  24-Sep-1975   Date of visit: 05/07/22  Diagnosis-  stage IVb endometrioid endometrial cancer     Chief complaint/ Reason for visit- on treatment assessment prior to cycle 6 of carbo/ taxol/ keytruda   Heme/Onc history: Patient is a 47 year old female who has seen [name] for irregular menstrual bleeding.  He has had an IUD in place and initially it was attribute it to use of IUD and was tried to be controlled with progesterone agents.  However due to worsening abdominal pain patient underwent a CT abdomen and pelvis with contrast on 12/25/2021 which showed 8 mm   Posterior lung base mass.  Retroperitoneal adenopathy measuring 2.6 cm additional retrocrural adenopathy.  Faint lucent lesion involving L2 vertebral body.  The uterus is anteverted and enlarged.  Endometrium is enlarged heterogeneous and irregular extending to the fundal myometrium.  Findings consistent with endometrial malignancy.  CT chest without contrast showed a 10 mm right lower lobe subpleural nodule.   Patient had endometrial biopsy which was consistent FIGO grade 2 endometrioid endometrial carcinoma.  Patient was seen by GYN oncology and hopes to take her for surgery if she has good response to neoadjuvant chemotherapy.MSI unstable.  BRAF negative.  Loss of major and minor MMR proteins MLH1 and PMS2 less than 5% of tumor expression.  MLH1 hyper methylation present.  This is most likely due to somatic epigenetic modification which can be seen in about 77% of sporadic endometrial cancers.   Patient currently on CarboTaxol Keytruda chemotherapy  Interval history- Doing well. Complains of voil on left breast that has opened up  and has slight drainage. No vaginal bleeding or pain. Appetite is good. Saw gyn onc last week. Has plan for pet scan.   ECOG PS- 2 Pain scale- 3 Opioid associated constipation- no  Review of systems- Review of Systems  Constitutional:  Negative for chills, fever, malaise/fatigue and weight loss.  HENT:  Negative for congestion, ear discharge and nosebleeds.   Eyes:  Negative for blurred vision.  Respiratory:  Negative for cough, hemoptysis, sputum production, shortness of breath and wheezing.   Cardiovascular:  Negative for chest pain, palpitations, orthopnea and claudication.  Gastrointestinal:  Negative for abdominal pain, blood in stool, constipation, diarrhea, heartburn, melena, nausea and vomiting.  Genitourinary:  Negative for dysuria, flank pain, frequency, hematuria and urgency.  Musculoskeletal:  Negative for back pain, joint pain and myalgias.  Skin:  Negative for rash.  Neurological:  Negative for dizziness, tingling, focal weakness, seizures, weakness and headaches.  Endo/Heme/Allergies:  Does not bruise/bleed easily.  Psychiatric/Behavioral:  Negative for depression and suicidal ideas. The patient does not have insomnia.      Allergies  Allergen Reactions   Bee Pollen Nausea And Vomiting    Itchy, swelling, watery eyes, runny nose.   Pollen Extract Nausea And Vomiting    Itchy, swelling, watery eyes, runny nose.    Past Medical History:  Diagnosis Date   Anemia    Endometrial cancer ([place])    Hypertension    Menorrhagia    Pulmonary embolism [facility])     Past Surgical History:  Procedure Laterality Date   CESAREAN SECTION  1994   COLONOSCOPY WITH PROPOFOL N/A 08/06/2021   Procedure: COLONOSCOPY WITH PROPOFOL;  Surgeon: Jonathon Bellows, MD;  Location: [facility] ENDOSCOPY;  Service: Gastroenterology;  Laterality: N/A;   ESOPHAGOGASTRODUODENOSCOPY (EGD) WITH PROPOFOL N/A 02/09/2020   Procedure: ESOPHAGOGASTRODUODENOSCOPY (EGD) WITH PROPOFOL;  Surgeon: Jonathon Bellows, MD;   Location: [facility] ENDOSCOPY;  Service: Gastroenterology;  Laterality: N/A;   IR IMAGING GUIDED PORT INSERTION  01/17/2022    Social History   Socioeconomic History   Marital status: Soil scientist    Spouse name: Not on file   Number of children: Not on file   Years of education: Not on file   Highest education level: Not on file  Occupational History   Not on file  Tobacco Use   Smoking status: Never   Smokeless tobacco: Never  Vaping Use   Vaping Use: Never used  Substance and Sexual Activity   Alcohol use: No   Drug use: No   Sexual activity: Not Currently    Birth control/protection: I.U.D.    Comment: patient states MD said did not see in CT today 01/17/2022  Other Topics Concern   Not on file  Social History Narrative   Lives with [facility], partner, and no pets.   Social Determinants of Health   Financial Resource Strain: Not on file  Food Insecurity: Not on file  Transportation Needs: Not on file  Physical Activity: Not on file  Stress: Not on file  Social Connections: Not on file  Intimate Partner Violence: Not on file    Family History  Problem Relation Age of Onset   Hypertension Mother    Stroke Mother    Heart attack Mother    Cancer Father    Hypertension Father    Cancer Sister    Cancer Sister    Cancer Brother    Hypertension Other     Current Outpatient Medications:    apixaban (ELIQUIS) 2.5 MG TABS tablet, Take 1 tablet (2.5 mg total) by mouth 2 (two) times daily., Disp: 60 tablet, Rfl: 3   benzonatate (TESSALON) 100 MG capsule, Take 1 capsule (100 mg total) by mouth 3 (three) times daily as needed for cough., Disp: 20 capsule, Rfl: 0   dexamethasone (DECADRON) 4 MG tablet, Take 2 tablets by mouth starting the day after chemotherapy for 3 days. Take with food., Disp: 30 tablet, Rfl: 1   levonorgestrel (MIRENA) 20 MCG/24HR IUD, 1 Intra Uterine Device (1 each total) by Intrauterine route once., Disp: 1 each, Rfl: 0   lidocaine-prilocaine (EMLA)  cream, Apply to port site as directed, Disp: 30 g, Rfl: 3   LORazepam (ATIVAN) 0.5 MG tablet, Take 1 tablet (0.5 mg total) by mouth every 8 (eight) hours., Disp: 30 tablet, Rfl: 0   magnesium hydroxide (MILK OF MAGNESIA) 400 MG/5ML suspension, Take 15 mLs by mouth daily as needed for moderate constipation. Take if no bowel movement for 2 days., Disp: , Rfl:    nystatin (MYCOSTATIN/NYSTOP) powder, Apply 1 Application topically 3 (three) times daily., Disp: 30 g, Rfl: 3   ondansetron (ZOFRAN) 8 MG tablet, Take 1 tablet (8 mg total) by mouth every 8 (eight) hours as needed for nausea or vomiting. Start on the third day after chemotherapy., Disp: 30 tablet, Rfl: 1   oxyCODONE (OXY IR/ROXICODONE) 5 MG immediate release tablet, Take 1 tablet (5 mg total) by mouth every 4 (four) hours as needed for severe pain., Disp: 120 tablet, Rfl: 0   pantoprazole (PROTONIX) 40 MG tablet, Take 1 tablet (40 mg total) by mouth daily., Disp: 30 tablet, Rfl: 2   polyethylene glycol (MIRALAX)  17 g packet, Take 17 g by mouth daily. To prevent constipation, Disp: 30 each, Rfl: 3   potassium chloride SA (KLOR-CON M) 20 MEQ tablet, Take 1 tablet (20 mEq total) by mouth 2 (two) times daily. Dissolve tablets, Disp: 60 tablet, Rfl: 1   prochlorperazine (COMPAZINE) 10 MG tablet, Take 1 tablet (10 mg total) by mouth every 6 (six) hours as needed for nausea or vomiting., Disp: 30 tablet, Rfl: 1   senna (SENOKOT) 8.6 MG TABS tablet, Take 1-2 tablets (8.6-17.2 mg total) by mouth daily. To prevent constipation, Disp: 120 tablet, Rfl: 0   linaclotide (LINZESS) 290 MCG CAPS capsule, Take 1 capsule (290 mcg total) by mouth daily before breakfast. (Patient not taking: Reported on 05/07/2022), Disp: 30 capsule, Rfl: 11  Physical exam:  Vitals:   05/07/22 0905  BP: (!) 135/94  Pulse: 96  Resp: 17  Temp: (!) 96.8 F (36 C)  TempSrc: Tympanic  SpO2: 100%  Weight: 214 lb (97.1 kg)   Physical Exam Constitutional:      Appearance: She is  not ill-appearing.  Cardiovascular:     Rate and Rhythm: Regular rhythm. Tachycardia present.  Pulmonary:     Effort: Pulmonary effort is normal.     Breath sounds: Normal breath sounds.  Abdominal:     General: There is no distension.     Palpations: Abdomen is soft.     Tenderness: There is no abdominal tenderness.  Skin:    General: Skin is warm and dry.     Comments: Fluctuant mass and focal erythema of skin of left inner breast, approx 2 mm skin opening. No drainage. Mildly tender.   Neurological:     Mental Status: She is alert and oriented to person, place, and time.  Psychiatric:        Mood and Affect: Mood normal.        Behavior: Behavior normal.         Latest Ref Rng & Units 05/07/2022    8:50 AM  CMP  Glucose 70 - 99 mg/dL 89   BUN 6 - 20 mg/dL 6   Creatinine 0.44 - 1.00 mg/dL 0.72   Sodium 135 - 145 mmol/L 134   Potassium 3.5 - 5.1 mmol/L 3.5   Chloride 98 - 111 mmol/L 102   CO2 22 - 32 mmol/L 25   Calcium 8.9 - 10.3 mg/dL 8.9   Total Protein 6.5 - 8.1 g/dL 7.5   Total Bilirubin 0.3 - 1.2 mg/dL 0.4   Alkaline Phos 38 - 126 U/L 63   AST 15 - 41 U/L 19   ALT 0 - 44 U/L 10       Latest Ref Rng & Units 05/07/2022    8:50 AM  CBC  WBC 4.0 - 10.5 K/uL 4.2   Hemoglobin 12.0 - 15.0 g/dL 8.4   Hematocrit 36.0 - 46.0 % 24.3   Platelets 150 - 400 K/uL 325     Assessment and plan- Patient is a 47 y.o. female  with   Stage IV endometrioid endometrial carcinoma T2 N2 M1.  She is here for on treatment assessment prior to cycle 6 of CarboTaxol Keytruda chemotherapy. Keytruda was added at cycle 2. She saw Dr Fransisca Connors last week and PET was recommended after 6 cycles for evaluation of role of completion surgery. She will see gyn onc for results in 2 weeks. Can consider maintenance keytruda based on Jean Morris data. If progression, consider adding lenvatinib.  Boil- start doxycycline 100 mg BID  x 7 days and warm compresses 4 times a day until improving. L5 compression  fracture- previously reported back pain and was found to have pathological fracture on imaging. Currently asymptomatic so radiation has been held. She has received dental clearance from Dr. Orrin Brigham and will proceed with zometa today. Advised again on risk of ONJ. She will start calcium 1200 mg and vitamin d 800 iu daily. May use tylenol after infusion if she experiences flu like symptoms.  Normocytic anemia- previously received IV iron. Repeat iron studies were well replenished. B12 normal. Likely related to bone marrow suppression d/t chemotherapy. Hmg 8.4 today. Plan to recheck counts at nadir. If < 7, plan for transfusion.  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  Chemo-induced peripheral neuropathy- mild. Has not worsened. Therefore, continue current dosing of paclitaxel.   Disposition:  1 week- lab (cbc, hold tube) Day 2- possible 1 unit pRBCs Follow up as scheduled - la   Visit Diagnosis 1. Encounter for antineoplastic chemotherapy   2. Encounter for antineoplastic immunotherapy   3. Endometrial cancer ([place])    Beckey Rutter, [place], AGNP-C, [place] at [facility] [phone] (clinic) 05/07/2022

## 2022-05-07 NOTE — Patient Instructions (Addendum)
[facility]  Discharge Instructions: Thank you for choosing [facility] to provide your oncology and hematology care.  If you have a lab appointment with the [facility], please go directly to the [facility] and check in at the registration area.  Wear comfortable clothing and clothing appropriate for easy access to any Portacath or PICC line.   We strive to give you quality time with your provider. You may need to reschedule your appointment if you arrive late (15 or more minutes).  Arriving late affects you and other patients whose appointments are after yours.  Also, if you miss three or more appointments without notifying the office, you may be dismissed from the clinic at the provider's discretion.      For prescription refill requests, have your pharmacy contact our office and allow 72 hours for refills to be completed.    Today you received the following chemotherapy and/or immunotherapy agents Keytruda, Taxol, and Carboplatin       To help prevent nausea and vomiting after your treatment, we encourage you to take your nausea medication as directed.  BELOW ARE SYMPTOMS THAT SHOULD BE REPORTED IMMEDIATELY: *FEVER GREATER THAN 100.4 F (38 C) OR HIGHER *CHILLS OR SWEATING *NAUSEA AND VOMITING THAT IS NOT CONTROLLED WITH YOUR NAUSEA MEDICATION *UNUSUAL SHORTNESS OF BREATH *UNUSUAL BRUISING OR BLEEDING *URINARY PROBLEMS (pain or burning when urinating, or frequent urination) *BOWEL PROBLEMS (unusual diarrhea, constipation, pain near the anus) TENDERNESS IN MOUTH AND THROAT WITH OR WITHOUT PRESENCE OF ULCERS (sore throat, sores in mouth, or a toothache) UNUSUAL RASH, SWELLING OR PAIN  UNUSUAL VAGINAL DISCHARGE OR ITCHING   Items with * indicate a potential emergency and should be followed up as soon as possible or go to the Emergency Department if any problems should occur.  Please show the CHEMOTHERAPY ALERT CARD or IMMUNOTHERAPY  ALERT CARD at check-in to the Emergency Department and triage nurse.  Should you have questions after your visit or need to cancel or reschedule your appointment, please contact [facility]  [phone] and follow the prompts.  Office hours are 8:00 a.m. to 4:30 p.m. Monday - Friday. Please note that voicemails left after 4:00 p.m. may not be returned until the following business day.  We are closed weekends and major holidays. You have access to a nurse at all times for urgent questions. Please call the main number to the clinic [phone] and follow the prompts.  For any non-urgent questions, you may also contact your provider using MyChart. We now offer e-Visits for anyone 63 and older to request care online for non-urgent symptoms. For details visit [email or web address].   Also download the MyChart app! Go to the app store, search "MyChart", open the app, select [facility], and log in with your MyChart username and password.   Zoledronic Acid Injection (Cancer) What is this medication? ZOLEDRONIC ACID (ZOE le dron ik AS id) treats high calcium levels in the blood caused by cancer. It may also be used with chemotherapy to treat weakened bones caused by cancer. It works by slowing down the release of calcium from bones. This lowers calcium levels in your blood. It also makes your bones stronger and less likely to break (fracture). It belongs to a group of medications called bisphosphonates. This medicine may be used for other purposes; ask your health care provider or pharmacist if you have questions. COMMON BRAND NAME(S): Zometa, Zometa Powder What should I tell my  care team before I take this medication? They need to know if you have any of these conditions: Dehydration Dental disease Kidney disease Liver disease Low levels of calcium in the blood Lung or breathing disease, such as asthma Receiving steroids, such as dexamethasone or prednisone An  unusual or allergic reaction to zoledronic acid, other medications, foods, dyes, or preservatives Pregnant or trying to get pregnant Breast-feeding How should I use this medication? This medication is injected into a vein. It is given by your care team in a hospital or clinic setting. Talk to your care team about the use of this medication in children. Special care may be needed. Overdosage: If you think you have taken too much of this medicine contact a poison control center or emergency room at once. NOTE: This medicine is only for you. Do not share this medicine with others. What if I miss a dose? Keep appointments for follow-up doses. It is important not to miss your dose. Call your care team if you are unable to keep an appointment. What may interact with this medication? Certain antibiotics given by injection Diuretics, such as bumetanide, furosemide NSAIDs, medications for pain and inflammation, such as ibuprofen or naproxen Teriparatide Thalidomide This list may not describe all possible interactions. Give your health care provider a list of all the medicines, herbs, non-prescription drugs, or dietary supplements you use. Also tell them if you smoke, drink alcohol, or use illegal drugs. Some items may interact with your medicine. What should I watch for while using this medication? Visit your care team for regular checks on your progress. It may be some time before you see the benefit from this medication. Some people who take this medication have severe bone, joint, or muscle pain. This medication may also increase your risk for jaw problems or a broken thigh bone. Tell your care team right away if you have severe pain in your jaw, bones, joints, or muscles. Tell you care team if you have any pain that does not go away or that gets worse. Tell your dentist and dental surgeon that you are taking this medication. You should not have major dental surgery while on this medication. See your  dentist to have a dental exam and fix any dental problems before starting this medication. Take good care of your teeth while on this medication. Make sure you see your dentist for regular follow-up appointments. You should make sure you get enough calcium and vitamin D while you are taking this medication. Discuss the foods you eat and the vitamins you take with your care team. Check with your care team if you have severe diarrhea, nausea, and vomiting, or if you sweat a lot. The loss of too much body fluid may make it dangerous for you to take this medication. You may need bloodwork while taking this medication. Talk to your care team if you wish to become pregnant or think you might be pregnant. This medication can cause serious birth defects. What side effects may I notice from receiving this medication? Side effects that you should report to your care team as soon as possible: Allergic reactions--skin rash, itching, hives, swelling of the face, lips, tongue, or throat Kidney injury--decrease in the amount of urine, swelling of the ankles, hands, or feet Low calcium level--muscle pain or cramps, confusion, tingling, or numbness in the hands or feet Osteonecrosis of the jaw--pain, swelling, or redness in the mouth, numbness of the jaw, poor healing after dental work, unusual discharge from the mouth,  visible bones in the mouth Severe bone, joint, or muscle pain Side effects that usually do not require medical attention (report to your care team if they continue or are bothersome): Constipation Fatigue Fever Loss of appetite Nausea Stomach pain This list may not describe all possible side effects. Call your doctor for medical advice about side effects. You may report side effects to FDA at 1-800-FDA-1088. Where should I keep my medication? This medication is given in a hospital or clinic. It will not be stored at home. NOTE: This sheet is a summary. It may not cover all possible information.  If you have questions about this medicine, talk to your doctor, pharmacist, or health care provider.  2023 Elsevier/Gold Standard (2007-04-03 00:00:00)

## 2022-05-15 ENCOUNTER — Ambulatory Visit
Admission: RE | Admit: 2022-05-15 | Discharge: 2022-05-15 | Disposition: A | Discharge status: Home / Self Care | Payer: BC Managed Care – PPO | Source: Ambulatory Visit | Attending: Nurse Practitioner | Admitting: Nurse Practitioner

## 2022-05-15 ENCOUNTER — Inpatient Hospital Stay (HOSPITAL_BASED_OUTPATIENT_CLINIC_OR_DEPARTMENT_OTHER): Payer: BC Managed Care – PPO | Admitting: Nurse Practitioner

## 2022-05-15 ENCOUNTER — Inpatient Hospital Stay: Payer: BC Managed Care – PPO

## 2022-05-15 ENCOUNTER — Other Ambulatory Visit: Payer: Self-pay | Admitting: *Deleted

## 2022-05-15 VITALS — BP 113/81 | Temp 97.0°F | Resp 18

## 2022-05-15 DIAGNOSIS — Z95828 Presence of other vascular implants and grafts: Secondary | ICD-10-CM | POA: Diagnosis not present

## 2022-05-15 DIAGNOSIS — Z5111 Encounter for antineoplastic chemotherapy: Secondary | ICD-10-CM

## 2022-05-15 DIAGNOSIS — M542 Cervicalgia: Secondary | ICD-10-CM | POA: Diagnosis not present

## 2022-05-15 DIAGNOSIS — M7989 Other specified soft tissue disorders: Secondary | ICD-10-CM | POA: Diagnosis not present

## 2022-05-15 DIAGNOSIS — M25511 Pain in right shoulder: Secondary | ICD-10-CM | POA: Diagnosis not present

## 2022-05-15 DIAGNOSIS — R221 Localized swelling, mass and lump, neck: Secondary | ICD-10-CM | POA: Diagnosis not present

## 2022-05-15 DIAGNOSIS — C541 Malignant neoplasm of endometrium: Secondary | ICD-10-CM

## 2022-05-15 DIAGNOSIS — G620 Drug-induced polyneuropathy: Secondary | ICD-10-CM | POA: Diagnosis not present

## 2022-05-15 DIAGNOSIS — Z7901 Long term (current) use of anticoagulants: Secondary | ICD-10-CM | POA: Diagnosis not present

## 2022-05-15 DIAGNOSIS — Z86718 Personal history of other venous thrombosis and embolism: Secondary | ICD-10-CM | POA: Diagnosis not present

## 2022-05-15 DIAGNOSIS — Z5112 Encounter for antineoplastic immunotherapy: Secondary | ICD-10-CM | POA: Diagnosis not present

## 2022-05-15 DIAGNOSIS — E876 Hypokalemia: Secondary | ICD-10-CM | POA: Diagnosis not present

## 2022-05-15 DIAGNOSIS — Z79899 Other long term (current) drug therapy: Secondary | ICD-10-CM | POA: Diagnosis not present

## 2022-05-15 DIAGNOSIS — D509 Iron deficiency anemia, unspecified: Secondary | ICD-10-CM | POA: Diagnosis not present

## 2022-05-15 DIAGNOSIS — I10 Essential (primary) hypertension: Secondary | ICD-10-CM | POA: Diagnosis not present

## 2022-05-15 DIAGNOSIS — C772 Secondary and unspecified malignant neoplasm of intra-abdominal lymph nodes: Secondary | ICD-10-CM | POA: Diagnosis not present

## 2022-05-15 DIAGNOSIS — D649 Anemia, unspecified: Secondary | ICD-10-CM | POA: Diagnosis not present

## 2022-05-15 LAB — CBC WITH DIFFERENTIAL/PLATELET
Abs Immature Granulocytes: 0 10*3/uL (ref 0.00–0.07)
Basophils Absolute: 0 10*3/uL (ref 0.0–0.1)
Basophils Relative: 1 %
Eosinophils Absolute: 0 10*3/uL (ref 0.0–0.5)
Eosinophils Relative: 1 %
HCT: 22.8 % — ABNORMAL LOW (ref 36.0–46.0)
Hemoglobin: 8.2 g/dL — ABNORMAL LOW (ref 12.0–15.0)
Immature Granulocytes: 0 %
Lymphocytes Relative: 64 %
Lymphs Abs: 1.2 10*3/uL (ref 0.7–4.0)
MCH: 28.9 pg (ref 26.0–34.0)
MCHC: 36 g/dL (ref 30.0–36.0)
MCV: 80.3 fL (ref 80.0–100.0)
Monocytes Absolute: 0.1 10*3/uL (ref 0.1–1.0)
Monocytes Relative: 5 %
Neutro Abs: 0.6 10*3/uL — ABNORMAL LOW (ref 1.7–7.7)
Neutrophils Relative %: 29 %
Platelets: 179 10*3/uL (ref 150–400)
RBC: 2.84 MIL/uL — ABNORMAL LOW (ref 3.87–5.11)
RDW: 19.5 % — ABNORMAL HIGH (ref 11.5–15.5)
WBC: 1.9 10*3/uL — ABNORMAL LOW (ref 4.0–10.5)
nRBC: 0 % (ref 0.0–0.2)

## 2022-05-15 LAB — SAMPLE TO BLOOD BANK

## 2022-05-15 MED ORDER — HEPARIN SOD (PORK) LOCK FLUSH 100 UNIT/ML IV SOLN
INTRAVENOUS | Status: AC
  Filled 2022-05-15: qty 5

## 2022-05-15 MED ORDER — MORPHINE SULFATE (PF) 2 MG/ML IV SOLN
2.0000 mg | Freq: Once | INTRAVENOUS | Status: AC
  Administered 2022-05-15: 2 mg via INTRAVENOUS
  Filled 2022-05-15: qty 1

## 2022-05-15 MED ORDER — HEPARIN SOD (PORK) LOCK FLUSH 100 UNIT/ML IV SOLN
500.0000 [IU] | Freq: Once | INTRAVENOUS | Status: AC
  Filled 2022-05-15: qty 5

## 2022-05-15 MED ORDER — SODIUM CHLORIDE 0.9% FLUSH
10.0000 mL | INTRAVENOUS | Status: DC | PRN
  Administered 2022-05-15: 10 mL via INTRAVENOUS
  Filled 2022-05-15: qty 10

## 2022-05-15 MED ORDER — SODIUM CHLORIDE 0.9 % IV SOLN
Freq: Once | INTRAVENOUS | Status: AC
  Filled 2022-05-15: qty 250

## 2022-05-15 MED ORDER — HEPARIN SOD (PORK) LOCK FLUSH 100 UNIT/ML IV SOLN
INTRAVENOUS | Status: AC
  Administered 2022-05-15: 500 [IU] via INTRAVENOUS
  Filled 2022-05-15: qty 5

## 2022-05-15 NOTE — Progress Notes (Signed)
Pt reports that she bumped up against the bathroom wall Tuesday, and since then the pain in right arm has increasingly gotten worse. Pt reports that this morning she was un able to lift her right arm, due to the pain. Pt reports pain starts in the neck and moves down the arm, but the pain is primarily in the right shoulder and upper arm. Beckey Rutter NP seen pt at chairside. Pt received 2 mg Morphine IV per Beckey Rutter NP verbal order with read back. Per Beckey Rutter NP pt to be scheduled for Ultrasound and Xray.

## 2022-05-15 NOTE — Progress Notes (Signed)
Symptom Management [place] at [place]. [facility] [address] [place], [place] [address] [phone] (phone) [phone] (fax)  Patient Care Team: Center, [facility] as PCP - General (General Practice) [name], RN as Oncology Nurse Navigator   Name of the patient: Jean Morris  [id number]  1975/05/24   Date of visit: 05/15/22  Diagnosis- Endometrial Cancer  Chief complaint/ Reason for visit- right arm pain  Heme/Onc history:  Oncology History  Endometrial cancer ([place])  01/15/2022 Initial Diagnosis   Endometrial cancer ([place])   01/15/2022 Cancer Staging   Staging form: Corpus Uteri - Carcinoma and Carcinosarcoma, AJCC 8th Edition - Clinical stage from 01/15/2022: FIGO Stage IVB (cT2, cN2, cM1) - Signed by Sindy Guadeloupe, MD on 01/15/2022 Stage prefix: Initial diagnosis   01/21/2022 -  Chemotherapy   Patient is on Treatment Plan : endometrial Carboplatin + Paclitaxel keytruda q21d       Interval history- Patient is 47 year old female, currently undergoing chemotherapy with carbo-taxol-pembro who presents to symptom management clinic for complaints of right arm pain. Symptoms started 2 days ago after she bumped the back of her arm going into the bathroom and then bumped front of her arm leaving the bathroom. Hit the door frame. She says it was not hard and she did not fall. Arm became sore but over past 24 hours, increasingly painful and she is not able to lift her arm. Pain primarily in the shoulder joint but radiates up right side of her neck. She has port in right check. No swelling. Took prescribed oxycodone for pain last night but nothing this morning. Feels tired and run down but ok otherwise. No cough or shortness of breath. Reports compliance with eliquis.   ECOG FS:2 - Symptomatic, <50% confined to bed  Review of systems- Review of Systems   Constitutional:  Positive for malaise/fatigue. Negative for chills, fever and weight loss.  HENT:  Negative for hearing loss, nosebleeds, sore throat and tinnitus.   Eyes:  Negative for blurred vision and double vision.  Respiratory:  Negative for cough, hemoptysis, shortness of breath and wheezing.   Cardiovascular:  Negative for chest pain, palpitations and leg swelling.  Gastrointestinal:  Negative for abdominal pain, blood in stool, constipation, diarrhea, melena, nausea and vomiting.  Genitourinary:  Negative for dysuria and urgency.  Musculoskeletal:  Positive for joint pain and neck pain. Negative for back pain, falls and myalgias.  Skin:  Negative for itching and rash.  Neurological:  Negative for dizziness, tingling, sensory change, loss of consciousness, weakness and headaches.  Endo/Heme/Allergies:  Negative for environmental allergies. Does not bruise/bleed easily.  Psychiatric/Behavioral:  Negative for depression. The patient is not nervous/anxious and does not have insomnia.      Allergies  Allergen Reactions   Bee Pollen Nausea And Vomiting    Itchy, swelling, watery eyes, runny nose.   Pollen Extract Nausea And Vomiting    Itchy, swelling, watery eyes, runny nose.    Past Medical History:  Diagnosis Date   Anemia    Endometrial cancer ([place])    Hypertension    Menorrhagia    Pulmonary embolism [facility])     Past Surgical History:  Procedure Laterality Date   CESAREAN SECTION  1994   COLONOSCOPY WITH PROPOFOL N/A 08/06/2021   Procedure: COLONOSCOPY WITH PROPOFOL;  Surgeon: Jonathon Bellows, MD;  Location: [facility] ENDOSCOPY;  Service: Gastroenterology;  Laterality: N/A;  ESOPHAGOGASTRODUODENOSCOPY (EGD) WITH PROPOFOL N/A 02/09/2020   Procedure: ESOPHAGOGASTRODUODENOSCOPY (EGD) WITH PROPOFOL;  Surgeon: Jonathon Bellows, MD;  Location: [facility] ENDOSCOPY;  Service: Gastroenterology;  Laterality: N/A;   IR IMAGING GUIDED PORT INSERTION  01/17/2022    Social History   Socioeconomic  History   Marital status: Soil scientist    Spouse name: Not on file   Number of children: Not on file   Years of education: Not on file   Highest education level: Not on file  Occupational History   Not on file  Tobacco Use   Smoking status: Never   Smokeless tobacco: Never  Vaping Use   Vaping Use: Never used  Substance and Sexual Activity   Alcohol use: No   Drug use: No   Sexual activity: Not Currently    Birth control/protection: I.U.D.    Comment: patient states MD said did not see in CT today 01/17/2022  Other Topics Concern   Not on file  Social History Narrative   Lives with [facility], partner, and no pets.   Social Determinants of Health   Financial Resource Strain: Not on file  Food Insecurity: Not on file  Transportation Needs: Not on file  Physical Activity: Not on file  Stress: Not on file  Social Connections: Not on file  Intimate Partner Violence: Not on file    Family History  Problem Relation Age of Onset   Hypertension Mother    Stroke Mother    Heart attack Mother    Cancer Father    Hypertension Father    Cancer Sister    Cancer Sister    Cancer Brother    Hypertension Other      Current Outpatient Medications:    apixaban (ELIQUIS) 2.5 MG TABS tablet, Take 1 tablet (2.5 mg total) by mouth 2 (two) times daily., Disp: 60 tablet, Rfl: 3   benzonatate (TESSALON) 100 MG capsule, Take 1 capsule (100 mg total) by mouth 3 (three) times daily as needed for cough., Disp: 20 capsule, Rfl: 0   dexamethasone (DECADRON) 4 MG tablet, Take 2 tablets by mouth starting the day after chemotherapy for 3 days. Take with food., Disp: 30 tablet, Rfl: 1   levonorgestrel (MIRENA) 20 MCG/24HR IUD, 1 Intra Uterine Device (1 each total) by Intrauterine route once., Disp: 1 each, Rfl: 0   lidocaine-prilocaine (EMLA) cream, Apply to port site as directed, Disp: 30 g, Rfl: 3   linaclotide (LINZESS) 290 MCG CAPS capsule, Take 1 capsule (290 mcg total) by mouth daily before  breakfast. (Patient not taking: Reported on 05/07/2022), Disp: 30 capsule, Rfl: 11   LORazepam (ATIVAN) 0.5 MG tablet, Take 1 tablet (0.5 mg total) by mouth every 8 (eight) hours., Disp: 30 tablet, Rfl: 0   magnesium hydroxide (MILK OF MAGNESIA) 400 MG/5ML suspension, Take 15 mLs by mouth daily as needed for moderate constipation. Take if no bowel movement for 2 days., Disp: , Rfl:    nystatin (MYCOSTATIN/NYSTOP) powder, Apply 1 Application topically 3 (three) times daily., Disp: 30 g, Rfl: 3   ondansetron (ZOFRAN) 8 MG tablet, Take 1 tablet (8 mg total) by mouth every 8 (eight) hours as needed for nausea or vomiting. Start on the third day after chemotherapy., Disp: 30 tablet, Rfl: 1   oxyCODONE (OXY IR/ROXICODONE) 5 MG immediate release tablet, Take 1 tablet (5 mg total) by mouth every 4 (four) hours as needed for severe pain., Disp: 120 tablet, Rfl: 0   pantoprazole (PROTONIX) 40 MG tablet, Take 1 tablet (40  mg total) by mouth daily., Disp: 30 tablet, Rfl: 2   polyethylene glycol (MIRALAX) 17 g packet, Take 17 g by mouth daily. To prevent constipation, Disp: 30 each, Rfl: 3   potassium chloride SA (KLOR-CON M) 20 MEQ tablet, Take 1 tablet (20 mEq total) by mouth 2 (two) times daily. Dissolve tablets, Disp: 60 tablet, Rfl: 1   prochlorperazine (COMPAZINE) 10 MG tablet, Take 1 tablet (10 mg total) by mouth every 6 (six) hours as needed for nausea or vomiting., Disp: 30 tablet, Rfl: 1   senna (SENOKOT) 8.6 MG TABS tablet, Take 1-2 tablets (8.6-17.2 mg total) by mouth daily. To prevent constipation, Disp: 120 tablet, Rfl: 0 No current facility-administered medications for this visit.  Facility-Administered Medications Ordered in Other Visits:    heparin lock flush 100 UNIT/ML injection, , , ,    heparin lock flush 100 UNIT/ML injection, , , ,    heparin lock flush 100 unit/mL, 500 Units, Intravenous, Once, Sindy Guadeloupe, MD   sodium chloride flush (NS) 0.9 % injection 10 mL, 10 mL, Intravenous, PRN,  Sindy Guadeloupe, MD, 10 mL at 05/15/22 1007  Physical exam:  Vitals:   05/15/22 1048  BP: 113/81  Resp: 18  Temp: (!) 97 F (36.1 C)  TempSrc: Tympanic  SpO2: 100%   Physical Exam Constitutional:      Appearance: She is not ill-appearing.     Comments: Fatigued appearing. In wheelchair.   Chest:     Comments: Right chest- port in place. Mild prominence of right IJ. Non-tender. No warmth.  Musculoskeletal:     Right shoulder: Tenderness and bony tenderness present. No swelling or deformity. Decreased range of motion.     Right upper arm: No swelling, tenderness or bony tenderness.     Right elbow: No swelling. Normal range of motion. No tenderness.     Right forearm: No swelling, deformity, tenderness or bony tenderness.     Right hand: No swelling. Normal capillary refill. Normal pulse.     Comments: Bony tenderness to tip of acromion and glenohumeral joint. Unable to tolerate passive ROM.   Lymphadenopathy:     Upper Body:     Right upper body: No axillary adenopathy.  Neurological:     Mental Status: She is alert.        Latest Ref Rng & Units 05/15/2022   10:09 AM  CBC  WBC 4.0 - 10.5 K/uL 1.9   Hemoglobin 12.0 - 15.0 g/dL 8.2   Hematocrit 36.0 - 46.0 % 22.8   Platelets 150 - 400 K/uL 179     DG Chest 2 View  Result Date: 04/21/2022 CLINICAL DATA:  Productive cough for 3 days, history of uterine cancer EXAM: CHEST - 2 VIEW COMPARISON:  03/10/2022 FINDINGS: Frontal and lateral views of the chest demonstrate stable right chest wall port. Cardiac silhouette is unremarkable. No acute airspace disease, effusion, or pneumothorax. No acute bony abnormalities. IMPRESSION: 1. No acute intrathoracic process. Electronically Signed   By: Randa Ngo M.D.   On: 04/21/2022 15:37    Assessment and plan- Patient is a 47 y.o. female   Acute right shoulder pain- post fall. Unable to assess d/t pain. Does not tolerate palpation of joint or passive ROM. Morphine 2 mg IV in clinic and  xray. She also has some neck edema, right sided port-a-cath. Will get ultrasound to r/o dvt. She is on eliquis for prior dvt and denies missing doses. Xray and ultrasound reviewed. No acute finding of dvt  or fracture. Suspect msk etiology. Will start robaxin for pain and topical voltaren Anemia- d/t chemotherapy. Hmg 8.2. No need for transfusion. Previously iron stores were normal.   Disposition:  Follow up as scheduled. RTC sooner if symptoms don't improve or worsen in interim.    Visit Diagnosis 1. Acute pain of right shoulder   2. Neck pain on right side   3. Endometrial cancer ([place])   4. Port-A-Cath in place    Patient expressed understanding and was in agreement with this plan. She also understands that She can call clinic at any time with any questions, concerns, or complaints.   Thank you for allowing me to participate in the care of this pleasant patient.   Beckey Rutter, DNP, AGNP-C, [place] [facility] [place]

## 2022-05-15 NOTE — Progress Notes (Signed)
Patient evaluated in infusion by Lauren for right upper extremity pain/neck pain/swelling.

## 2022-05-16 ENCOUNTER — Telehealth: Payer: Self-pay

## 2022-05-16 ENCOUNTER — Inpatient Hospital Stay: Payer: BC Managed Care – PPO

## 2022-05-16 NOTE — Telephone Encounter (Signed)
Per secure chat from Beckey Rutter:  can you call Jean Morris from yesterday and let her know that she does not have a fracture or a blood clot. Can use topical voltaren gel to shoulder for pain and I'll send prescription for robaxin which is a muscle relaxer  Called and informed patient of above message. Advised patient to use otc Voltaren prn for pain or discomfort. Also advised to use muscle relaxer prn as well. Advised to call back with any questions or concerns. Patient verbalized understanding.

## 2022-05-19 ENCOUNTER — Other Ambulatory Visit: Payer: Self-pay | Admitting: Nurse Practitioner

## 2022-05-19 ENCOUNTER — Telehealth: Payer: Self-pay | Admitting: *Deleted

## 2022-05-19 ENCOUNTER — Other Ambulatory Visit: Payer: Self-pay | Admitting: *Deleted

## 2022-05-19 ENCOUNTER — Ambulatory Visit
Admission: RE | Admit: 2022-05-19 | Discharge: 2022-05-19 | Disposition: A | Discharge status: Home / Self Care | Payer: BC Managed Care – PPO | Source: Ambulatory Visit | Attending: Nurse Practitioner | Admitting: Nurse Practitioner

## 2022-05-19 DIAGNOSIS — E876 Hypokalemia: Secondary | ICD-10-CM

## 2022-05-19 DIAGNOSIS — C541 Malignant neoplasm of endometrium: Secondary | ICD-10-CM | POA: Diagnosis not present

## 2022-05-19 DIAGNOSIS — N852 Hypertrophy of uterus: Secondary | ICD-10-CM | POA: Diagnosis not present

## 2022-05-19 DIAGNOSIS — C772 Secondary and unspecified malignant neoplasm of intra-abdominal lymph nodes: Secondary | ICD-10-CM | POA: Insufficient documentation

## 2022-05-19 DIAGNOSIS — R9389 Abnormal findings on diagnostic imaging of other specified body structures: Secondary | ICD-10-CM | POA: Insufficient documentation

## 2022-05-19 DIAGNOSIS — M4856XD Collapsed vertebra, not elsewhere classified, lumbar region, subsequent encounter for fracture with routine healing: Secondary | ICD-10-CM | POA: Diagnosis not present

## 2022-05-19 LAB — GLUCOSE, CAPILLARY: Glucose-Capillary: 82 mg/dL (ref 70–99)

## 2022-05-19 MED ORDER — POTASSIUM CHLORIDE CRYS ER 20 MEQ PO TBCR: 20.0000 meq | EXTENDED_RELEASE_TABLET | Freq: Two times a day (BID) | ORAL | 2 refills | Status: DC

## 2022-05-19 MED ORDER — METHOCARBAMOL 500 MG PO TABS: 500.0000 mg | ORAL_TABLET | Freq: Two times a day (BID) | ORAL | 0 refills | Status: DC | PRN

## 2022-05-19 MED ORDER — FLUDEOXYGLUCOSE F - 18 (FDG) INJECTION
11.9100 | Freq: Once | INTRAVENOUS | Status: AC | PRN
  Administered 2022-05-19: 11.91 via INTRAVENOUS

## 2022-05-19 NOTE — Telephone Encounter (Signed)
Pt is requesting RF on potassium

## 2022-05-19 NOTE — Telephone Encounter (Signed)
Jean Morris- patient needs her robaxin prescription sent to her pharmacy

## 2022-05-20 ENCOUNTER — Encounter: Payer: Self-pay | Admitting: Nurse Practitioner

## 2022-05-20 ENCOUNTER — Encounter: Payer: Self-pay | Admitting: Oncology

## 2022-05-21 ENCOUNTER — Inpatient Hospital Stay (HOSPITAL_BASED_OUTPATIENT_CLINIC_OR_DEPARTMENT_OTHER): Payer: BC Managed Care – PPO | Admitting: Obstetrics and Gynecology

## 2022-05-21 ENCOUNTER — Other Ambulatory Visit: Payer: Self-pay

## 2022-05-21 ENCOUNTER — Other Ambulatory Visit: Payer: BC Managed Care – PPO

## 2022-05-21 VITALS — BP 127/96 | HR 111 | Temp 96.6°F | Resp 17 | Wt 207.0 lb

## 2022-05-21 DIAGNOSIS — E876 Hypokalemia: Secondary | ICD-10-CM | POA: Diagnosis not present

## 2022-05-21 DIAGNOSIS — C541 Malignant neoplasm of endometrium: Secondary | ICD-10-CM

## 2022-05-21 DIAGNOSIS — Z5111 Encounter for antineoplastic chemotherapy: Secondary | ICD-10-CM | POA: Diagnosis not present

## 2022-05-21 DIAGNOSIS — Z5112 Encounter for antineoplastic immunotherapy: Secondary | ICD-10-CM | POA: Diagnosis not present

## 2022-05-21 DIAGNOSIS — Z95828 Presence of other vascular implants and grafts: Secondary | ICD-10-CM

## 2022-05-21 DIAGNOSIS — Z86718 Personal history of other venous thrombosis and embolism: Secondary | ICD-10-CM | POA: Diagnosis not present

## 2022-05-21 DIAGNOSIS — G620 Drug-induced polyneuropathy: Secondary | ICD-10-CM | POA: Diagnosis not present

## 2022-05-21 DIAGNOSIS — Z79899 Other long term (current) drug therapy: Secondary | ICD-10-CM | POA: Diagnosis not present

## 2022-05-21 DIAGNOSIS — M542 Cervicalgia: Secondary | ICD-10-CM | POA: Diagnosis not present

## 2022-05-21 DIAGNOSIS — D649 Anemia, unspecified: Secondary | ICD-10-CM

## 2022-05-21 DIAGNOSIS — C772 Secondary and unspecified malignant neoplasm of intra-abdominal lymph nodes: Secondary | ICD-10-CM

## 2022-05-21 DIAGNOSIS — D509 Iron deficiency anemia, unspecified: Secondary | ICD-10-CM | POA: Diagnosis not present

## 2022-05-21 DIAGNOSIS — Z7901 Long term (current) use of anticoagulants: Secondary | ICD-10-CM | POA: Diagnosis not present

## 2022-05-21 DIAGNOSIS — D508 Other iron deficiency anemias: Secondary | ICD-10-CM | POA: Diagnosis not present

## 2022-05-21 DIAGNOSIS — M25511 Pain in right shoulder: Secondary | ICD-10-CM | POA: Diagnosis not present

## 2022-05-21 DIAGNOSIS — Z7189 Other specified counseling: Secondary | ICD-10-CM

## 2022-05-21 DIAGNOSIS — I10 Essential (primary) hypertension: Secondary | ICD-10-CM | POA: Diagnosis not present

## 2022-05-21 NOTE — Progress Notes (Signed)
Gynecologic Oncology Interval Visit   Referring Provider: [name] & Dr Tasia Catchings  Chief Complaint: Endometrial adenocarcinoma, stage IVB  Subjective:  Jean Morris is a 47 y.o. female who is seen in consultation from [name] for irregular bleeding for many years, diagnosed with Stage IVb endometrioid endometrial cancer currently s/p 5 cycles of NA carboplatin with paclitaxel and Keytruda added at cycle 2 who returns to clinic for consideration of completion surgery.   05/07/2022- D1C6 carbo-paclitaxel-pembrolizumab  05/19/2022 PET  COMPARISON:  CT chest abdomen pelvis dated 03/17/2022.   FINDINGS:  NECK: Small bilateral level 2 cervical nodes measuring up to 6 mm short axis (series 4/image 20), max SUV 3.8 on the left.   CHEST: Small right axillary nodes measuring up to 6 mm short axis inferiorly (series 4/image 53), max SUV 3.0.   Small left axillary/intramammary nodes measuring up to 8 mm short axis inferiorly (series 4/image 58), max SUV 4.9.   No hypermetabolic mediastinal lymphadenopathy.   No hypermetabolic pulmonary nodules.   Right chest port terminates at the cavoatrial junction.   ABDOMEN/PELVIS: Abnormal soft tissue in the left periaortic region measuring 1.6 x 2.9 cm (series 4/image 99), max SUV 3.1. Adjacent discrete 9 mm short axis left para-aortic node (series 4/image 99), max SUV 5.8. When compared to the prior, the dominant left periaortic nodal soft tissue has improved.   Small bilateral inferior inguinal nodes measuring up to 6 mm short axis on the left (series 4/image 153), max SUV 2.2.   Enlarged uterus with endometrial thickening, corresponding to the patient's known endometrial cancer. Associated central endometrial hypermetabolism, max SUV 4.7.   IMPRESSION: Enlarged uterus with hypermetabolic endometrial thickening, corresponding to the patient's known endometrial cancer.   Improving left periaortic nodal metastases, as above. Additional small cervical,  axillary, and inguinal nodes likely reflect nodal metastases, although low-grade lymphoma is also possible.      Gynecologic Oncology History:  Patient presented to her OB/GYN for irregular bleeding for several months.  She was thought to have an IUD in place and her bleeding was attempted to be controlled with progesterone agents.  Medical history significant for PE in [facility] in 02/2015  12/25/21- CT C/A/P-  for left lower quadrant abdominal pain showed enlarged uterus with thickened endometrium, heterogeneous and irregular extending to the fundal myometrium.  Retroperitoneal and retrocrural adenopathy.  Faint lucent lesion in left L2 vertebrae suspicious for metastasis.  Posterior right lower lobe subpleural nodule measuring 10 mm with surrounding groundglass opacity; suspicious for metastasis though was present on 08/2015 CT but measured 5 mm.   Pelvic ultrasound- Uterus-19 x 13.3 x 15 cm = volume 1974 mls.  Distinct endometrium not visible.  Heterogeneous area which may be surrounded by myometrium may be as much is 7 cm in greatest thickness. Endometrium-thickness, perhaps cystic is 75 mm.  Markedly heterogeneous with signs of internal flow. Right ovary-not visualized Left ovary-5.8 x 3.1 x 4.2 cm.  Simple dominant follicle measuring up to 3.5 cm accounts for much of the left ovarian dimension.  Pulse Doppler evaluation shows low resistance arterial and venous flow within the left ovary Other-small free fluid in the pelvis.  Endometrial biopsy- -endometrioid adenocarcinoma, FIGO grade 2  She was started on norethindrone by [name].   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 She was taking oral contraceptives at that time with increased dosing d/t heavy menstrual periods. Additionally, hx of iron deficiency and has taken oral iron. Given extent of disease and  VTE neoaduvant therapy was recommended.    Treatment Summary:  01/21/22- D1C1- carboplatin 02/12/22- D1C2  carbo-paclitaxel-pembrolizumab 03/05/22- D1C3 carbo-paclitaxel-pembrolizumab   03/17/22- CT Chest Abdomen Pelvis W Contrast IMPRESSION: 1. Persistent heterogeneous irregularity of the endometrium with uterine enlargement compatible with known endometrial malignancy.  2. New heterogeneity of the 2.8 cm left ovarian lesion, nonspecific but possibly reflecting metastatic disease. Consider further evaluation with pelvic ultrasound. 3. Overall decreased retrocrural and retroperitoneal adenopathy. However, there is interval increase in size and adjacent infiltrative/desmoplastic stranding adjacent to a centrally necrotic left periaortic lymph node/lymph node conglomerate. 4. Continued decrease in size of the posterior right lower lobe pulmonary nodule. 5. New pathologic compression deformity of the L2 vertebral body. 6. Diffuse hepatic steatosis. 7. Mild diffuse bronchial wall thickening with mosaic attenuation of the lungs, suggestive of small airways disease.  She saw Dr Janese Banks on 03/26/22 for results. She was asymptomatic of back pain at that time. Dr Janese Banks considered palliative radiation, however, patient was asymptomatic so it was held. She is awaiting dental clearance for possible bisphosphonates.   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  BRAF V600E not present. The tumor cells are NEGATIVE for Her2 (1+). Estrogen Receptor:       POSITIVE, 90%, MODERATE STAINING INTENSITY Progesterone Receptor:   POSITIVE, 80%, STRONG STAINING INTENSITY   Problem List: Patient Active Problem List   Diagnosis Date Noted   Hypokalemia 04/01/2022   Endometrial cancer ([place]) 01/15/2022   Iron deficiency anemia 01/15/2022   Morbid obesity with BMI of 60.0-69.9, adult ([place]) 04/18/2015   Abnormal uterine bleeding 04/18/2015   Anemia 04/18/2015   Pulmonary embolism ([place]) 03/11/2015     Past Medical History: Past Medical History:  Diagnosis Date   Anemia    Endometrial cancer ([place])    Hypertension    Menorrhagia    Pulmonary embolism ([place])     Past Surgical History: Past Surgical History:  Procedure Laterality Date   CESAREAN SECTION  1994   COLONOSCOPY WITH PROPOFOL N/A 08/06/2021   Procedure: COLONOSCOPY WITH PROPOFOL;  Surgeon: Jonathon Bellows, MD;  Location: [facility] ENDOSCOPY;  Service: Gastroenterology;  Laterality: N/A;   ESOPHAGOGASTRODUODENOSCOPY (EGD) WITH PROPOFOL N/A 02/09/2020   Procedure: ESOPHAGOGASTRODUODENOSCOPY (EGD) WITH PROPOFOL;  Surgeon: Jonathon Bellows, MD;  Location: [facility] ENDOSCOPY;  Service: Gastroenterology;  Laterality: N/A;   IR IMAGING GUIDED PORT INSERTION  01/17/2022    Past Gynecologic History:  Menarche: age 27, last 5 days History of OCP/HRT use: yes Last pap:  03/14/21- ASCUS, HPV negative History of STDs: The patient denies history of sexually transmitted disease. Sexually active: yes; not currently  OB History:  OB History  Gravida Para Term Preterm AB Living  3 3 3     3   SAB IAB Ectopic Multiple Live Births          3    # Outcome Date GA Lbr Len/2nd Weight Sex Delivery Anes PTL Lv  3 Term     M CS-LTranv   LIV  2 Term     M Vag-Spont   LIV  1 Term     F Vag-Spont   LIV   Family History: Family History  Problem Relation Age of Onset   Hypertension Mother    Stroke Mother    Heart attack Mother    Cancer Father    Hypertension Father    Cancer Sister    Cancer Sister    Cancer Brother  Hypertension Other    Social History: Social History   Socioeconomic History   Marital status: Soil scientist    Spouse name: Not on file   Number of children: Not on file   Years of education: Not on file   Highest education level: Not on file  Occupational History   Not on file  Tobacco Use   Smoking status: Never   Smokeless tobacco: Never  Vaping Use   Vaping Use: Never used  Substance and Sexual Activity    Alcohol use: No   Drug use: No   Sexual activity: Not Currently    Birth control/protection: I.U.D.    Comment: patient states MD said did not see in CT today 01/17/2022  Other Topics Concern   Not on file  Social History Narrative   Lives with [facility], partner, and no pets.   Social Determinants of Health   Financial Resource Strain: Not on file  Food Insecurity: Not on file  Transportation Needs: Not on file  Physical Activity: Not on file  Stress: Not on file  Social Connections: Not on file  Intimate Partner Violence: Not on file   Allergies: Allergies  Allergen Reactions   Bee Pollen Nausea And Vomiting    Itchy, swelling, watery eyes, runny nose.   Pollen Extract Nausea And Vomiting    Itchy, swelling, watery eyes, runny nose.   Current Medications: Current Outpatient Medications  Medication Sig Dispense Refill   apixaban (ELIQUIS) 2.5 MG TABS tablet Take 1 tablet (2.5 mg total) by mouth 2 (two) times daily. 60 tablet 3   benzonatate (TESSALON) 100 MG capsule Take 1 capsule (100 mg total) by mouth 3 (three) times daily as needed for cough. 20 capsule 0   dexamethasone (DECADRON) 4 MG tablet Take 2 tablets by mouth starting the day after chemotherapy for 3 days. Take with food. 30 tablet 1   levonorgestrel (MIRENA) 20 MCG/24HR IUD 1 Intra Uterine Device (1 each total) by Intrauterine route once. 1 each 0   lidocaine-prilocaine (EMLA) cream Apply to port site as directed 30 g 3   LORazepam (ATIVAN) 0.5 MG tablet Take 1 tablet (0.5 mg total) by mouth every 8 (eight) hours. 30 tablet 0   magnesium hydroxide (MILK OF MAGNESIA) 400 MG/5ML suspension Take 15 mLs by mouth daily as needed for moderate constipation. Take if no bowel movement for 2 days.     methocarbamol (ROBAXIN) 500 MG tablet Take 1 tablet (500 mg total) by mouth 2 (two) times daily as needed for muscle spasms. 30 tablet 0   nystatin (MYCOSTATIN/NYSTOP) powder Apply 1 Application topically 3 (three) times daily.  30 g 3   ondansetron (ZOFRAN) 8 MG tablet Take 1 tablet (8 mg total) by mouth every 8 (eight) hours as needed for nausea or vomiting. Start on the third day after chemotherapy. 30 tablet 1   oxyCODONE (OXY IR/ROXICODONE) 5 MG immediate release tablet Take 1 tablet (5 mg total) by mouth every 4 (four) hours as needed for severe pain. 120 tablet 0   pantoprazole (PROTONIX) 40 MG tablet Take 1 tablet (40 mg total) by mouth daily. 30 tablet 2   polyethylene glycol (MIRALAX) 17 g packet Take 17 g by mouth daily. To prevent constipation 30 each 3   potassium chloride SA (KLOR-CON M) 20 MEQ tablet Take 1 tablet (20 mEq total) by mouth 2 (two) times daily. Dissolve tablets 60 tablet 2   prochlorperazine (COMPAZINE) 10 MG tablet Take 1 tablet (10 mg total) by mouth  every 6 (six) hours as needed for nausea or vomiting. 30 tablet 1   senna (SENOKOT) 8.6 MG TABS tablet Take 1-2 tablets (8.6-17.2 mg total) by mouth daily. To prevent constipation 120 tablet 0   linaclotide (LINZESS) 290 MCG CAPS capsule Take 1 capsule (290 mcg total) by mouth daily before breakfast. (Patient not taking: Reported on 05/07/2022) 30 capsule 11   No current facility-administered medications for this visit.    Review of Systems General: fatigue  HEENT: no complaints  Lungs: shortness of breath/cough  Cardiac: no complaints  [id number] bloating/distension o/w no complaints  GU: vaginal bleeding/discharge   Musculoskeletal: back pain  Extremities: no complaints  Skin: no complaints  Neuro: numbness/tingling  Endocrine: no complaints  Psych: no complaints       Objective:  Physical Examination:  BP (!) 127/96 (Patient Position: Sitting)   Pulse (!) 111   Temp (!) 96.6 F (35.9 C) (Tympanic)   Resp 17   Wt 207 lb (93.9 kg)   LMP 02/17/2022 (Approximate)   SpO2 100%   BMI 34.45 kg/m     ECOG Performance Status: 2 - Symptomatic, <50% confined to bed  GENERAL: Patient is a well appearing female in no acute  distress; she did present to clinic in a wheelchair. She can walk independently but has DOE with long distances (a few blocks) HEENT:  PERRL, neck supple with midline trachea.   NODES:  No cervical, supraclavicular, axillary, or inguinal lymphadenopathy palpated.  LUNGS:  Normal respiratory effort ABDOMEN:  Soft, nontender.  Firm abdominal mass palpated, 14-16 weeks, at midline (decreased from prior exams).  EXTREMITIES:  No peripheral edema.   SKIN:  Clear with no obvious rashes NEURO:  Nonfocal. Well oriented.  Appropriate affect.  Pelvic: EGBUS: no lesions Cervix: no lesions, nontender, mobile Vagina: no lesions, no discharge or bleeding Uterus:  Enlarged - 14-16 week size, with decreased mobility. On BME parametria smooth bilaterally. The uterus extends closer to the left side wall but is not firmly fixed.  Adnexa: no palpable masses Rectovaginal: deferred  Labs Lab Results  Component Value Date   WBC 1.9 (L) 05/15/2022   HGB 8.2 (L) 05/15/2022   HCT 22.8 (L) 05/15/2022   MCV 80.3 05/15/2022   PLT 179 05/15/2022     Chemistry      Component Value Date/Time   NA 134 (L) 05/07/2022 0850   NA 139 10/23/2021 0940   NA 136 06/15/2013 1946   K 3.5 05/07/2022 0850   K 3.4 (L) 06/15/2013 1946   CL 102 05/07/2022 0850   CL 104 06/15/2013 1946   CO2 25 05/07/2022 0850   CO2 25 06/15/2013 1946   BUN 6 05/07/2022 0850   BUN 7 10/23/2021 0940   BUN 7 06/15/2013 1946   CREATININE 0.72 05/07/2022 0850   CREATININE 0.62 04/21/2022 1311   CREATININE 0.94 06/15/2013 1946      Component Value Date/Time   CALCIUM 8.9 05/07/2022 0850   CALCIUM 8.8 06/15/2013 1946   ALKPHOS 63 05/07/2022 0850   ALKPHOS 81 04/08/2013 1258   AST 19 05/07/2022 0850   AST 39 04/21/2022 1311   ALT 10 05/07/2022 0850   ALT 28 04/21/2022 1311   ALT 16 04/08/2013 1258   BILITOT 0.4 05/07/2022 0850   BILITOT 1.1 04/21/2022 1311     Lab Results  Component Value Date   IRON 59 04/16/2022   TIBC 137  (L) 04/16/2022   FERRITIN 287 04/16/2022   Lab Results  Component Value  Date   [id number] 04/16/2022   Lab Results  Component Value Date   FOLATE 10.7 01/15/2022   05/07/2022 Albumin 3.0    IMAGING:   PET SCAn 05/19/2022 05/19/2022 PET    COMPARISON:  CT chest abdomen pelvis dated 03/17/2022.   FINDINGS: Mediastinal blood pool activity: SUV max 2.8   Liver activity: SUV max NA   NECK: Small bilateral level 2 cervical nodes measuring up to 6 mm short axis (series 4/image 20), max SUV 3.8 on the left.   Incidental CT findings: None.   CHEST: Small right axillary nodes measuring up to 6 mm short axis inferiorly (series 4/image 53), max SUV 3.0.   Small left axillary/intramammary nodes measuring up to 8 mm short axis inferiorly (series 4/image 58), max SUV 4.9.   No hypermetabolic mediastinal lymphadenopathy.   No hypermetabolic pulmonary nodules.   Right chest port terminates at the cavoatrial junction.   Incidental CT findings: None.   ABDOMEN/PELVIS: Abnormal soft tissue in the left periaortic region measuring 1.6 x 2.9 cm (series 4/image 99), max SUV 3.1. Adjacent discrete 9 mm short axis left para-aortic node (series 4/image 99), max SUV 5.8. When compared to the prior, the dominant left periaortic nodal soft tissue has improved.   Small bilateral inferior inguinal nodes measuring up to 6 mm short axis on the left (series 4/image 153), max SUV 2.2.   Enlarged uterus with endometrial thickening, corresponding to the patient's known endometrial cancer. Associated central endometrial hypermetabolism, max SUV 4.7.   Incidental CT findings: None.   SKELETON: No focal hypermetabolic activity to suggest skeletal metastasis.   Incidental CT findings: Degenerative changes of the lumbar spine with known L2 compression fracture deformity.   IMPRESSION: Enlarged uterus with hypermetabolic endometrial thickening, corresponding to the patient's known endometrial  cancer.   Improving left periaortic nodal metastases, as above.   Additional small cervical, axillary, and inguinal nodes likely reflect nodal metastases, although low-grade lymphoma is also possible.            Assessment:  Bruchy L Bachtell is a 47 y.o. female with PMB, anemia and large uterus diagnosed with figo grade 2 endometrial cancer on endometrial biopsy 11/23.  Stage IVB based on suspicious lung lesion, L2 vertebral lesion, significantly enlarged left aortic adenopathy consistent with metastatic disease on imaging. Improved disease control s/p 6 cycles of therapy.   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  BRAF V600E not present. The tumor cells are NEGATIVE for Her2 (1+). Estrogen Receptor:       POSITIVE, 90%, MODERATE STAINING INTENSITY Progesterone Receptor:   POSITIVE, 80%, STRONG STAINING INTENSITY  Anemia, with prior normal iron, B12, and folate with low decreased TIBC and Tsats.   Hypokalemia, asymptomatic  Prior abnormal EKG 02/2022  Hypoalbuminemia  Deconditioning  Body mass index is 34.45 kg/m.  History of PE currently on Eliquis  Medical co-morbidities complicating care: HTN, Body mass index is 34.45 kg/m., prior surgery, PE while taking OCs. Plan:   Problem List Items Addressed This Visit       Genitourinary   Endometrial cancer ([place]) - Primary     Other   Hypokalemia   Iron deficiency anemia   Other Visit Diagnoses     Port-A-Cath in place       Counseling and coordination of care            Recommend continuing pembrolizumab maintenance with Dr Janese Banks her Medical Oncologist.  We discussed additional management options including surgery  and radiation.  Given her excellent response we recommended surgery with a minimally invasive approach.  There may be benefit of local disease control with hysterectomy given her persistent spotting.  If she does not have any evidence of peritoneal carcinomatosis she may  also benefit from removal of the periaortic lymph node.  I discussed recommendations with Dr. Fransisca Connors and he agrees.  We will perform the procedure at [facility] and I have contacted Verdell Carmine to check on this operative availability.  While we do plan for minimally invasive approach she understands that there may be a need for conversion to a laparotomy procedure. Maudie Mercury will contact her to schedule her preoperative appointment in [facility].  We will check reticulocytes to determine if she may have an indication for an iron transfusion.  If she continues to be persistently anemic transfusion preoperatively or consideration for type and cross may be needed. She also has labs scheduled next week that we can use for preop labs. We will order a EKG given her symptoms of DOE and prior EKG 03/10/2022 with nonspecific ST and [place] abnormalities.   Nutrition consult to discuss increasing protein in her diet, optimize nutrition and wound healing in anticipation of surgery.   PT consult for conditioning.   She will need counseling on when to stop her Eliquis prior to surgery.  After surgery consideration can be made for the role of radiation therapy for local nodal basin control.   If she develops progressive disease it would be an option to continue Pembrolizumab and add Lenvatanib.  In view of the endometrioid histology and positive ER/PR expression she could also be a candidate for a hormonal regimen such as alternating Megace and Tamoxifen or Everolimus/Letrozole.    At her [facility] visit they can discuss the [place] study.   The patient's diagnosis, an outline of the further diagnostic and laboratory studies which will be required, the recommendation for surgery, and alternatives were discussed with her and her accompanying family members.  All questions were answered to their satisfaction.  I personally interviewed and examined the patient. Agreed with the above/below plan of care. I have directly contributed to  assessment and plan of care of this patient and educated and discussed with patient and family.   Julyssa Kyer Gaetana Michaelis, MD

## 2022-05-21 NOTE — Progress Notes (Signed)
  Patient here for gyn onc concerns of light brown spotting, abdominal distension and cough/SOB

## 2022-05-21 NOTE — Progress Notes (Signed)
EKG scheduled and she has been notified. Made aware of need for PT referral. They will contact her with appointment. We will work to get her back in with dietician.

## 2022-05-22 ENCOUNTER — Ambulatory Visit
Admission: RE | Admit: 2022-05-22 | Discharge: 2022-05-22 | Disposition: A | Discharge status: Home / Self Care | Payer: BC Managed Care – PPO | Source: Ambulatory Visit | Attending: Obstetrics and Gynecology | Admitting: Obstetrics and Gynecology

## 2022-05-22 DIAGNOSIS — Z01818 Encounter for other preprocedural examination: Secondary | ICD-10-CM | POA: Diagnosis not present

## 2022-05-22 DIAGNOSIS — D500 Iron deficiency anemia secondary to blood loss (chronic): Secondary | ICD-10-CM | POA: Diagnosis not present

## 2022-05-22 DIAGNOSIS — D649 Anemia, unspecified: Secondary | ICD-10-CM | POA: Diagnosis not present

## 2022-05-22 DIAGNOSIS — C541 Malignant neoplasm of endometrium: Secondary | ICD-10-CM | POA: Insufficient documentation

## 2022-05-27 ENCOUNTER — Other Ambulatory Visit: Payer: Self-pay

## 2022-05-27 MED FILL — Dexamethasone Sodium Phosphate Inj 100 MG/10ML: INTRAMUSCULAR | Qty: 1 | Status: AC

## 2022-05-28 ENCOUNTER — Inpatient Hospital Stay (HOSPITAL_BASED_OUTPATIENT_CLINIC_OR_DEPARTMENT_OTHER): Payer: BC Managed Care – PPO | Admitting: Oncology

## 2022-05-28 ENCOUNTER — Encounter: Payer: Self-pay | Admitting: Oncology

## 2022-05-28 ENCOUNTER — Other Ambulatory Visit: Payer: Self-pay | Admitting: *Deleted

## 2022-05-28 ENCOUNTER — Inpatient Hospital Stay: Payer: BC Managed Care – PPO | Attending: Obstetrics and Gynecology

## 2022-05-28 ENCOUNTER — Inpatient Hospital Stay: Payer: BC Managed Care – PPO

## 2022-05-28 VITALS — BP 132/85 | HR 115 | Temp 97.5°F | Resp 18 | Ht 65.0 in | Wt 207.3 lb

## 2022-05-28 DIAGNOSIS — R5383 Other fatigue: Secondary | ICD-10-CM | POA: Insufficient documentation

## 2022-05-28 DIAGNOSIS — Z5112 Encounter for antineoplastic immunotherapy: Secondary | ICD-10-CM | POA: Diagnosis not present

## 2022-05-28 DIAGNOSIS — C541 Malignant neoplasm of endometrium: Secondary | ICD-10-CM

## 2022-05-28 DIAGNOSIS — Z86711 Personal history of pulmonary embolism: Secondary | ICD-10-CM | POA: Insufficient documentation

## 2022-05-28 DIAGNOSIS — M4856XA Collapsed vertebra, not elsewhere classified, lumbar region, initial encounter for fracture: Secondary | ICD-10-CM | POA: Insufficient documentation

## 2022-05-28 DIAGNOSIS — D649 Anemia, unspecified: Secondary | ICD-10-CM | POA: Insufficient documentation

## 2022-05-28 DIAGNOSIS — G620 Drug-induced polyneuropathy: Secondary | ICD-10-CM | POA: Diagnosis not present

## 2022-05-28 DIAGNOSIS — M25511 Pain in right shoulder: Secondary | ICD-10-CM | POA: Diagnosis not present

## 2022-05-28 DIAGNOSIS — R000 Tachycardia, unspecified: Secondary | ICD-10-CM | POA: Diagnosis not present

## 2022-05-28 DIAGNOSIS — Z7901 Long term (current) use of anticoagulants: Secondary | ICD-10-CM | POA: Insufficient documentation

## 2022-05-28 DIAGNOSIS — I10 Essential (primary) hypertension: Secondary | ICD-10-CM | POA: Insufficient documentation

## 2022-05-28 DIAGNOSIS — R590 Localized enlarged lymph nodes: Secondary | ICD-10-CM | POA: Insufficient documentation

## 2022-05-28 DIAGNOSIS — N920 Excessive and frequent menstruation with regular cycle: Secondary | ICD-10-CM | POA: Insufficient documentation

## 2022-05-28 DIAGNOSIS — D508 Other iron deficiency anemias: Secondary | ICD-10-CM

## 2022-05-28 DIAGNOSIS — Z79899 Other long term (current) drug therapy: Secondary | ICD-10-CM | POA: Insufficient documentation

## 2022-05-28 LAB — COMPREHENSIVE METABOLIC PANEL
ALT: 11 U/L (ref 0–44)
AST: 22 U/L (ref 15–41)
Albumin: 3.3 g/dL — ABNORMAL LOW (ref 3.5–5.0)
Alkaline Phosphatase: 54 U/L (ref 38–126)
Anion gap: 9 (ref 5–15)
BUN: 8 mg/dL (ref 6–20)
CO2: 23 mmol/L (ref 22–32)
Calcium: 8.9 mg/dL (ref 8.9–10.3)
Chloride: 104 mmol/L (ref 98–111)
Creatinine, Ser: 0.66 mg/dL (ref 0.44–1.00)
GFR, Estimated: >60 mL/min (ref >60)
Glucose, Bld: 103 mg/dL — ABNORMAL HIGH (ref 70–99)
Potassium: 3.4 mmol/L — ABNORMAL LOW (ref 3.5–5.1)
Sodium: 136 mmol/L (ref 135–145)
Total Bilirubin: 0.5 mg/dL (ref 0.3–1.2)
Total Protein: 7.2 g/dL (ref 6.5–8.1)

## 2022-05-28 LAB — CBC WITH DIFFERENTIAL/PLATELET
Abs Immature Granulocytes: 0.03 10*3/uL (ref 0.00–0.07)
Basophils Absolute: 0 10*3/uL (ref 0.0–0.1)
Basophils Relative: 1 %
Eosinophils Absolute: 0 10*3/uL (ref 0.0–0.5)
Eosinophils Relative: 0 %
HCT: 25.6 % — ABNORMAL LOW (ref 36.0–46.0)
Hemoglobin: 9 g/dL — ABNORMAL LOW (ref 12.0–15.0)
Immature Granulocytes: 1 %
Lymphocytes Relative: 55 %
Lymphs Abs: 2 10*3/uL (ref 0.7–4.0)
MCH: 29.3 pg (ref 26.0–34.0)
MCHC: 35.2 g/dL (ref 30.0–36.0)
MCV: 83.4 fL (ref 80.0–100.0)
Monocytes Absolute: 0.3 10*3/uL (ref 0.1–1.0)
Monocytes Relative: 8 %
Neutro Abs: 1.3 10*3/uL — ABNORMAL LOW (ref 1.7–7.7)
Neutrophils Relative %: 35 %
Platelets: 253 10*3/uL (ref 150–400)
RBC: 3.07 MIL/uL — ABNORMAL LOW (ref 3.87–5.11)
RDW: 19.8 % — ABNORMAL HIGH (ref 11.5–15.5)
WBC: 3.7 10*3/uL — ABNORMAL LOW (ref 4.0–10.5)
nRBC: 0.5 % — ABNORMAL HIGH (ref 0.0–0.2)

## 2022-05-28 MED ORDER — HEPARIN SOD (PORK) LOCK FLUSH 100 UNIT/ML IV SOLN
500.0000 [IU] | Freq: Once | INTRAVENOUS | Status: AC | PRN
  Filled 2022-05-28: qty 5

## 2022-05-28 MED ORDER — HEPARIN SOD (PORK) LOCK FLUSH 100 UNIT/ML IV SOLN
INTRAVENOUS | Status: AC
  Administered 2022-05-28: 500 [IU]
  Filled 2022-05-28: qty 5

## 2022-05-28 MED ORDER — SODIUM CHLORIDE 0.9 % IV SOLN
10.0000 mg | Freq: Once | INTRAVENOUS | Status: AC
  Administered 2022-05-28: 10 mg via INTRAVENOUS
  Filled 2022-05-28: qty 10

## 2022-05-28 MED ORDER — SODIUM CHLORIDE 0.9 % IV SOLN
200.0000 mg | Freq: Once | INTRAVENOUS | Status: AC
  Administered 2022-05-28: 200 mg via INTRAVENOUS
  Filled 2022-05-28: qty 8

## 2022-05-28 MED ORDER — SODIUM CHLORIDE 0.9 % IV SOLN
Freq: Once | INTRAVENOUS | Status: AC
  Filled 2022-05-28: qty 250

## 2022-05-28 NOTE — Progress Notes (Signed)
Zometa approval

## 2022-05-28 NOTE — Progress Notes (Signed)
No concerns for the provider today. 

## 2022-05-28 NOTE — Progress Notes (Signed)
Hematology/Oncology Consult note [facility]  [phone] [phone]  Patient Care Team: [name], RN as Oncology Nurse Navigator Sindy Guadeloupe, MD as Medical Oncologist (Oncology)   Name of the patient: Jean Morris  [id number]  [birth year]   Date of visit: 05/28/22  Diagnosis- stage IVb endometrioid endometrial cancer     Chief complaint/ Reason for visit-on treatment assessment prior to cycle 1 of palliative Keytruda  Heme/Onc history: Patient is a 47 year old female who has seen [name] for irregular menstrual bleeding.  He has had an IUD in place and initially it was attribute it to use of IUD and was tried to be controlled with progesterone agents.  However due to worsening abdominal pain patient underwent a CT abdomen and pelvis with contrast on 12/25/2021 which showed 8 mm   Posterior lung base mass.  Retroperitoneal adenopathy measuring 2.6 cm additional retrocrural adenopathy.  Faint lucent lesion involving L2 vertebral body.  The uterus is anteverted and enlarged.  Endometrium is enlarged heterogeneous and irregular extending to the fundal myometrium.  Findings consistent with endometrial malignancy.  CT chest without contrast showed a 10 mm right lower lobe subpleural nodule.   Patient had endometrial biopsy which was consistent FIGO grade 2 endometrioid endometrial carcinoma.  Patient was seen by GYN oncology and hopes to take her for surgery if she has good response to neoadjuvant chemotherapy.MSI unstable.  BRAF negative.  Loss of major and minor MMR proteins MLH1 and PMS2 less than 5% of tumor expression.  MLH1 hyper methylation present.  This is most likely due to somatic epigenetic modification which can be seen in about 77% of sporadic endometrial cancers.   Patient currently on CarboTaxol Keytruda chemotherapySo you are in which she will so when we give chemo hold permissive chemo and PET CT scan on 05/19/2022 showed  enlarged uterus with hypermetabolic endometrial thickening.  Improving left periaortic nodal metastases.  No definitive bone metastases was noted.  Plan is to proceed with cytoreductive surgery at [facility]  Interval history-she is doing well overall today.  Denies any significant nausea or vomiting.  She has some ongoing fatigue.  She is looking forward for proceeding with surgery later this month.  ECOG PS- 1 Pain scale- 0 Opioid associated constipation- no  Review of systems- Review of Systems  Constitutional:  Positive for malaise/fatigue. Negative for chills, fever and weight loss.  HENT:  Negative for congestion, ear discharge and nosebleeds.   Eyes:  Negative for blurred vision.  Respiratory:  Negative for cough, hemoptysis, sputum production, shortness of breath and wheezing.   Cardiovascular:  Negative for chest pain, palpitations, orthopnea and claudication.  Gastrointestinal:  Negative for abdominal pain, blood in stool, constipation, diarrhea, heartburn, melena, nausea and vomiting.  Genitourinary:  Negative for dysuria, flank pain, frequency, hematuria and urgency.  Musculoskeletal:  Negative for back pain, joint pain and myalgias.  Skin:  Negative for rash.  Neurological:  Negative for dizziness, tingling, focal weakness, seizures, weakness and headaches.  Endo/Heme/Allergies:  Does not bruise/bleed easily.  Psychiatric/Behavioral:  Negative for depression and suicidal ideas. The patient does not have insomnia.       Allergies  Allergen Reactions   Bee Pollen Nausea And Vomiting    Itchy, swelling, watery eyes, runny nose.   Pollen Extract Nausea And Vomiting    Itchy, swelling, watery eyes, runny nose.     Past Medical History:  Diagnosis Date   Anemia    Endometrial cancer  Hypertension    Menorrhagia    Pulmonary embolism      Past Surgical History:  Procedure Laterality Date   CESAREAN SECTION  1994   COLONOSCOPY WITH PROPOFOL N/A 08/06/2021   Procedure:  COLONOSCOPY WITH PROPOFOL;  Surgeon: Jonathon Bellows, MD;  Location: [facility] ENDOSCOPY;  Service: Gastroenterology;  Laterality: N/A;   ESOPHAGOGASTRODUODENOSCOPY (EGD) WITH PROPOFOL N/A 02/09/2020   Procedure: ESOPHAGOGASTRODUODENOSCOPY (EGD) WITH PROPOFOL;  Surgeon: Jonathon Bellows, MD;  Location: [facility] ENDOSCOPY;  Service: Gastroenterology;  Laterality: N/A;   IR IMAGING GUIDED PORT INSERTION  01/17/2022    Social History   Socioeconomic History   Marital status: Soil scientist    Spouse name: Not on file   Number of children: Not on file   Years of education: Not on file   Highest education level: Not on file  Occupational History   Not on file  Tobacco Use   Smoking status: Never   Smokeless tobacco: Never  Vaping Use   Vaping Use: Never used  Substance and Sexual Activity   Alcohol use: No   Drug use: No   Sexual activity: Not Currently    Birth control/protection: I.U.D.    Comment: patient states MD said did not see in CT today 01/17/2022  Other Topics Concern   Not on file  Social History Narrative   Lives with [facility], partner, and no pets.   Social Determinants of Health   Financial Resource Strain: Not on file  Food Insecurity: Not on file  Transportation Needs: Not on file  Physical Activity: Not on file  Stress: Not on file  Social Connections: Not on file  Intimate Partner Violence: Not on file    Family History  Problem Relation Age of Onset   Hypertension Mother    Stroke Mother    Heart attack Mother    Cancer Father    Hypertension Father    Cancer Sister    Cancer Sister    Cancer Brother    Hypertension Other      Current Outpatient Medications:    apixaban (ELIQUIS) 2.5 MG TABS tablet, Take 1 tablet (2.5 mg total) by mouth 2 (two) times daily., Disp: 60 tablet, Rfl: 3   benzonatate (TESSALON) 100 MG capsule, Take 1 capsule (100 mg total) by mouth 3 (three) times daily as needed for cough., Disp: 20 capsule, Rfl: 0   dexamethasone (DECADRON) 4 MG  tablet, Take 2 tablets by mouth starting the day after chemotherapy for 3 days. Take with food., Disp: 30 tablet, Rfl: 1   levonorgestrel (MIRENA) 20 MCG/24HR IUD, 1 Intra Uterine Device (1 each total) by Intrauterine route once., Disp: 1 each, Rfl: 0   lidocaine-prilocaine (EMLA) cream, Apply to port site as directed, Disp: 30 g, Rfl: 3   LORazepam (ATIVAN) 0.5 MG tablet, Take 1 tablet (0.5 mg total) by mouth every 8 (eight) hours., Disp: 30 tablet, Rfl: 0   magnesium hydroxide (MILK OF MAGNESIA) 400 MG/5ML suspension, Take 15 mLs by mouth daily as needed for moderate constipation. Take if no bowel movement for 2 days., Disp: , Rfl:    methocarbamol (ROBAXIN) 500 MG tablet, Take 1 tablet (500 mg total) by mouth 2 (two) times daily as needed for muscle spasms., Disp: 30 tablet, Rfl: 0   nystatin (MYCOSTATIN/NYSTOP) powder, Apply 1 Application topically 3 (three) times daily., Disp: 30 g, Rfl: 3   ondansetron (ZOFRAN) 8 MG tablet, Take 1 tablet (8 mg total) by mouth every 8 (eight) hours as needed for nausea  or vomiting. Start on the third day after chemotherapy., Disp: 30 tablet, Rfl: 1   oxyCODONE (OXY IR/ROXICODONE) 5 MG immediate release tablet, Take 1 tablet (5 mg total) by mouth every 4 (four) hours as needed for severe pain., Disp: 120 tablet, Rfl: 0   pantoprazole (PROTONIX) 40 MG tablet, Take 1 tablet (40 mg total) by mouth daily., Disp: 30 tablet, Rfl: 2   polyethylene glycol (MIRALAX) 17 g packet, Take 17 g by mouth daily. To prevent constipation, Disp: 30 each, Rfl: 3   potassium chloride SA (KLOR-CON M) 20 MEQ tablet, Take 1 tablet (20 mEq total) by mouth 2 (two) times daily. Dissolve tablets, Disp: 60 tablet, Rfl: 2   prochlorperazine (COMPAZINE) 10 MG tablet, Take 1 tablet (10 mg total) by mouth every 6 (six) hours as needed for nausea or vomiting., Disp: 30 tablet, Rfl: 1   senna (SENOKOT) 8.6 MG TABS tablet, Take 1-2 tablets (8.6-17.2 mg total) by mouth daily. To prevent constipation,  Disp: 120 tablet, Rfl: 0  Physical exam:  Vitals:   05/28/22 0919  BP: 132/85  Pulse: (!) 115  Resp: 18  Temp: (!) 97.5 F (36.4 C)  TempSrc: Tympanic  SpO2: 100%  Weight: 207 lb 4.8 oz (94 kg)  Height: 5\' 5"  (1.651 m)   Physical Exam Cardiovascular:     Rate and Rhythm: Regular rhythm. Tachycardia present.     Heart sounds: Normal heart sounds.  Pulmonary:     Effort: Pulmonary effort is normal.     Breath sounds: Normal breath sounds.  Abdominal:     General: Bowel sounds are normal.     Palpations: Abdomen is soft.  Skin:    General: Skin is warm and dry.  Neurological:     Mental Status: She is alert and oriented to person, place, and time.         Latest Ref Rng & Units 05/28/2022    8:58 AM  CMP  Glucose 70 - 99 mg/dL 103   BUN 6 - 20 mg/dL 8   Creatinine 0.44 - 1.00 mg/dL 0.66   Sodium 135 - 145 mmol/L 136   Potassium 3.5 - 5.1 mmol/L 3.4   Chloride 98 - 111 mmol/L 104   CO2 22 - 32 mmol/L 23   Calcium 8.9 - 10.3 mg/dL 8.9   Total Protein 6.5 - 8.1 g/dL 7.2   Total Bilirubin 0.3 - 1.2 mg/dL 0.5   Alkaline Phos 38 - 126 U/L 54   AST 15 - 41 U/L 22   ALT 0 - 44 U/L 11       Latest Ref Rng & Units 05/28/2022    8:58 AM  CBC  WBC 4.0 - 10.5 K/uL 3.7   Hemoglobin 12.0 - 15.0 g/dL 9.0   Hematocrit 36.0 - 46.0 % 25.6   Platelets 150 - 400 K/uL 253     No images are attached to the encounter.  NM PET Image Initial (PI) Skull Base To Thigh  Result Date: 05/21/2022 CLINICAL DATA:  Subsequent treatment strategy for endometrial cancer. Status post chemotherapy, evaluate for possible surgery. EXAM: NUCLEAR MEDICINE PET SKULL BASE TO THIGH TECHNIQUE: 11.9 mCi F-18 FDG was injected intravenously. Full-ring PET imaging was performed from the skull base to thigh after the radiotracer. CT data was obtained and used for attenuation correction and anatomic localization. Fasting blood glucose: 82 mg/dl COMPARISON:  CT chest abdomen pelvis dated 03/17/2022. FINDINGS:  Mediastinal blood pool activity: SUV max 2.8 Liver activity: SUV max NA  NECK: Small bilateral level 2 cervical nodes measuring up to 6 mm short axis (series 4/image 20), max SUV 3.8 on the left. Incidental CT findings: None. CHEST: Small right axillary nodes measuring up to 6 mm short axis inferiorly (series 4/image 53), max SUV 3.0. Small left axillary/intramammary nodes measuring up to 8 mm short axis inferiorly (series 4/image 58), max SUV 4.9. No hypermetabolic mediastinal lymphadenopathy. No hypermetabolic pulmonary nodules. Right chest port terminates at the cavoatrial junction. Incidental CT findings: None. ABDOMEN/PELVIS: Abnormal soft tissue in the left periaortic region measuring 1.6 x 2.9 cm (series 4/image 99), max SUV 3.1. Adjacent discrete 9 mm short axis left para-aortic node (series 4/image 99), max SUV 5.8. When compared to the prior, the dominant left periaortic nodal soft tissue has improved. Small bilateral inferior inguinal nodes measuring up to 6 mm short axis on the left (series 4/image 153), max SUV 2.2. Enlarged uterus with endometrial thickening, corresponding to the patient's known endometrial cancer. Associated central endometrial hypermetabolism, max SUV 4.7. Incidental CT findings: None. SKELETON: No focal hypermetabolic activity to suggest skeletal metastasis. Incidental CT findings: Degenerative changes of the lumbar spine with known L2 compression fracture deformity. IMPRESSION: Enlarged uterus with hypermetabolic endometrial thickening, corresponding to the patient's known endometrial cancer. Improving left periaortic nodal metastases, as above. Additional small cervical, axillary, and inguinal nodes likely reflect nodal metastases, although low-grade lymphoma is also possible. Electronically Signed   By: Julian Hy M.D.   On: 05/21/2022 01:13   DG Shoulder Right  Result Date: 05/15/2022 CLINICAL DATA:  Acute right shoulder pain. Fell into a wall the other day. Bruising  and swelling in right arm. EXAM: RIGHT SHOULDER - 2+ VIEW COMPARISON:  None Available. FINDINGS: Moderate inferior glenohumeral joint space narrowing. Moderate inferior humeral head-neck junction degenerative osteophytosis. Mild-to-moderate peripheral glenoid degenerative osteophytosis. There is a well corticated ossicle measuring up to approximately 12 x 5 mm at the posterosuperior aspect of the glenoid, likely the sequela of remote trauma. Mild acromioclavicular joint space narrowing and peripheral osteophytosis. Right chest wall portacatheter tip overlies the central superior vena cava. IMPRESSION: 1. Moderate glenohumeral osteoarthritis. 2. Mild acromioclavicular osteoarthritis. Electronically Signed   By: Yvonne Kendall M.D.   On: 05/15/2022 11:59   US Venous Img Upper Uni Right  Result Date: 05/15/2022 CLINICAL DATA:  Neck pain and swelling.  Right shoulder pain. EXAM: RIGHT UPPER EXTREMITY VENOUS DOPPLER ULTRASOUND TECHNIQUE: Gray-scale sonography with graded compression, as well as color Doppler and duplex ultrasound were performed to evaluate the upper extremity deep venous system from the level of the subclavian vein and including the jugular, axillary, basilic, radial, ulnar and upper cephalic vein. Spectral Doppler was utilized to evaluate flow at rest and with distal augmentation maneuvers. COMPARISON:  None Available. FINDINGS: Contralateral Subclavian Vein: Respiratory phasicity is normal and symmetric with the symptomatic side. No evidence of thrombus. Normal compressibility. Internal Jugular Vein: No evidence of thrombus. Normal compressibility, respiratory phasicity and response to augmentation. Subclavian Vein: No evidence of thrombus. Normal compressibility, respiratory phasicity and response to augmentation. Axillary Vein: No evidence of thrombus. Normal compressibility, respiratory phasicity and response to augmentation. Cephalic Vein: No evidence of thrombus. Normal compressibility,  respiratory phasicity and response to augmentation. Basilic Vein: No evidence of thrombus. Normal compressibility, respiratory phasicity and response to augmentation. Brachial Veins: No evidence of thrombus. Normal compressibility, respiratory phasicity and response to augmentation. Radial Veins: No evidence of thrombus. Normal compressibility, respiratory phasicity and response to augmentation. Ulnar Veins: No evidence of thrombus. Normal compressibility,  respiratory phasicity and response to augmentation. Venous Reflux:  None visualized. Other Findings:  None visualized. IMPRESSION: No evidence of DVT within the right upper extremity. Electronically Signed   By: Titus Dubin M.D.   On: 05/15/2022 11:41     Assessment and plan- Patient is a 47 y.o. female  with stage IV endometrioid endometrial carcinoma T2 N2 M1.  She is here for on treatment assessment prior to cycle 1 of palliative Keytruda  I have reviewed PET CT scan images independently and discussed findings with the patient.  Patient has completed 6 cyclesOf CarboTaxol Keytruda chemotherapy.  She had a PET CT scan on 05/19/2022 which showed hypermetabolism in the endometrium known endometrial cancer but overall improving periaortic nodal metastases.  There was no definitive evidence of bone metastases.  She has had retrocrural and retroperitoneal adenopathy noted in the past as well which has decreased in size.  Plan is to proceed with cytoreductive surgery which will be done at [facility] on [address].  She will proceed with Keytruda today and I will see her back in 3 weeks.  Overall she is recovering well from her surgery she will continue with single agent Keytruda until progression or toxicity.  TSH to be checked in 3 weeks  Chemo-induced peripheral neuropathy: Overall stable and she has completed Taxol which was also dose reduced  L5 compression fracture was noted on CT scan but was not noted on PET scan.  I am therefore holding off on any  bisphosphonates at this time  Patient is on low-dose Eliquis 2.5 mg twice daily for prophylaxis.  She will need to stop that 3 days prior to surgery and resume postsurgery  Sinus tachycardia: This has been a chronic issue.  If it persists in the future I will consider cardiology referral   Visit Diagnosis 1. Endometrial cancer   2. Encounter for antineoplastic immunotherapy      Dr. Randa Evens, MD, MPH [facility] at [facility] [id number] 05/28/2022 1:45 PM

## 2022-05-28 NOTE — Progress Notes (Signed)
Pt unable to give urine sample for pregnancy test. Per sherry RN per Dr. Janese Banks okay to discharge pt home and pt will have a urine pregnancy next treatment. Pt aware.

## 2022-05-28 NOTE — Progress Notes (Signed)
HR 115 ok to proceed  

## 2022-05-28 NOTE — Patient Instructions (Signed)
[facility] [place] [facility]  Discharge Instructions: Thank you for choosing [facility] to provide your oncology and hematology care.  If you have a lab appointment with the Cancer Center, please go directly to the Cancer Center and check in at the registration area.  Wear comfortable clothing and clothing appropriate for easy access to any Portacath or PICC line.   We strive to give you quality time with your provider. You may need to reschedule your appointment if you arrive late (15 or more minutes).  Arriving late affects you and other patients whose appointments are after yours.  Also, if you miss three or more appointments without notifying the office, you may be dismissed from the clinic at the provider's discretion.      For prescription refill requests, have your pharmacy contact our office and allow 72 hours for refills to be completed.    Today you received the following chemotherapy and/or immunotherapy agents- Keytruda      To help prevent nausea and vomiting after your treatment, we encourage you to take your nausea medication as directed.  BELOW ARE SYMPTOMS THAT SHOULD BE REPORTED IMMEDIATELY: *FEVER GREATER THAN 100.4 F (38 C) OR HIGHER *CHILLS OR SWEATING *NAUSEA AND VOMITING THAT IS NOT CONTROLLED WITH YOUR NAUSEA MEDICATION *UNUSUAL SHORTNESS OF BREATH *UNUSUAL BRUISING OR BLEEDING *URINARY PROBLEMS (pain or burning when urinating, or frequent urination) *BOWEL PROBLEMS (unusual diarrhea, constipation, pain near the anus) TENDERNESS IN MOUTH AND THROAT WITH OR WITHOUT PRESENCE OF ULCERS (sore throat, sores in mouth, or a toothache) UNUSUAL RASH, SWELLING OR PAIN  UNUSUAL VAGINAL DISCHARGE OR ITCHING   Items with * indicate a potential emergency and should be followed up as soon as possible or go to the Emergency Department if any problems should occur.  Please show the CHEMOTHERAPY ALERT CARD or IMMUNOTHERAPY ALERT CARD at check-in to  the Emergency Department and triage nurse.  Should you have questions after your visit or need to cancel or reschedule your appointment, please contact [facility] [place] [facility]  [phone] and follow the prompts.  Office hours are 8:00 a.m. to 4:30 p.m. Monday - Friday. Please note that voicemails left after 4:00 p.m. may not be returned until the following business day.  We are closed weekends and major holidays. You have access to a nurse at all times for urgent questions. Please call the main number to the clinic [phone] and follow the prompts.  For any non-urgent questions, you may also contact your provider using MyChart. We now offer e-Visits for anyone 18 and older to request care online for non-urgent symptoms. For details visit [email or web address].   Also download the MyChart app! Go to the app store, search "MyChart", open the app, select [facility], and log in with your MyChart username and password.   

## 2022-05-29 ENCOUNTER — Other Ambulatory Visit: Payer: Self-pay

## 2022-05-29 DIAGNOSIS — E876 Hypokalemia: Secondary | ICD-10-CM | POA: Diagnosis not present

## 2022-05-29 DIAGNOSIS — D509 Iron deficiency anemia, unspecified: Secondary | ICD-10-CM | POA: Diagnosis not present

## 2022-05-29 DIAGNOSIS — C541 Malignant neoplasm of endometrium: Secondary | ICD-10-CM | POA: Diagnosis not present

## 2022-05-29 DIAGNOSIS — Z01818 Encounter for other preprocedural examination: Secondary | ICD-10-CM | POA: Diagnosis not present

## 2022-05-29 DIAGNOSIS — Z86711 Personal history of pulmonary embolism: Secondary | ICD-10-CM | POA: Diagnosis not present

## 2022-05-29 DIAGNOSIS — C772 Secondary and unspecified malignant neoplasm of intra-abdominal lymph nodes: Secondary | ICD-10-CM | POA: Diagnosis not present

## 2022-05-29 DIAGNOSIS — I10 Essential (primary) hypertension: Secondary | ICD-10-CM | POA: Diagnosis not present

## 2022-05-29 DIAGNOSIS — Z7901 Long term (current) use of anticoagulants: Secondary | ICD-10-CM | POA: Diagnosis not present

## 2022-05-30 DIAGNOSIS — C541 Malignant neoplasm of endometrium: Secondary | ICD-10-CM | POA: Diagnosis not present

## 2022-05-30 DIAGNOSIS — I2699 Other pulmonary embolism without acute cor pulmonale: Secondary | ICD-10-CM | POA: Diagnosis not present

## 2022-05-30 DIAGNOSIS — Z01818 Encounter for other preprocedural examination: Secondary | ICD-10-CM | POA: Diagnosis not present

## 2022-05-30 DIAGNOSIS — D509 Iron deficiency anemia, unspecified: Secondary | ICD-10-CM | POA: Diagnosis not present

## 2022-06-01 ENCOUNTER — Other Ambulatory Visit: Payer: Self-pay

## 2022-06-03 ENCOUNTER — Encounter: Payer: Self-pay | Admitting: Nurse Practitioner

## 2022-06-03 ENCOUNTER — Encounter: Payer: Self-pay | Admitting: Oncology

## 2022-06-04 ENCOUNTER — Inpatient Hospital Stay: Payer: BC Managed Care – PPO

## 2022-06-04 NOTE — Progress Notes (Signed)
Nutrition Follow-up:   Patient with endometrial stage IV cancer.  Patient received carboplatin and keytruda.  Planning surgery on 4/17 at [facility].  Referral to optimize nutrition prior to surgery.   Met with patient in clinic.  Reports good appetite. Reports issues with constipation but took miralax and MOM with relief.  Eating 3 meals a day and tolerating well    Medications: reviewed  Labs: reviewed  Anthropometrics:   Weight 205 lb 6 oz today  214 lb on 3/13 220 lb on 2/21 217 lb on 1/31 244 lb on 12/6  NUTRITION DIAGNOSIS: Inadequate oral intake improved    INTERVENTION:  Discussed foods rich in protein and how to incorporate them in diet.  Protein food list provided with protein goal to reach daily. Recommend starting 30g protein shake daily today and consume until healed from surgery. Examples provided for patient.  Can start MVI daily after surgery until healed.       MONITORING, EVALUATION, GOAL: weight trends, intake   NEXT VISIT: Wed, Zea 24 during infusion  Bricen Victory B. Freida Busman, RD, LDN Registered Dietitian [phone]

## 2022-06-08 ENCOUNTER — Other Ambulatory Visit: Payer: Self-pay

## 2022-06-10 DIAGNOSIS — D251 Intramural leiomyoma of uterus: Secondary | ICD-10-CM | POA: Diagnosis not present

## 2022-06-10 DIAGNOSIS — Z7901 Long term (current) use of anticoagulants: Secondary | ICD-10-CM | POA: Diagnosis not present

## 2022-06-10 DIAGNOSIS — D259 Leiomyoma of uterus, unspecified: Secondary | ICD-10-CM | POA: Diagnosis not present

## 2022-06-10 DIAGNOSIS — C548 Malignant neoplasm of overlapping sites of corpus uteri: Secondary | ICD-10-CM | POA: Diagnosis not present

## 2022-06-10 DIAGNOSIS — C541 Malignant neoplasm of endometrium: Secondary | ICD-10-CM | POA: Diagnosis not present

## 2022-06-10 DIAGNOSIS — N8003 Adenomyosis of the uterus: Secondary | ICD-10-CM | POA: Diagnosis not present

## 2022-06-10 DIAGNOSIS — Z79899 Other long term (current) drug therapy: Secondary | ICD-10-CM | POA: Diagnosis not present

## 2022-06-10 DIAGNOSIS — N736 Female pelvic peritoneal adhesions (postinfective): Secondary | ICD-10-CM | POA: Diagnosis not present

## 2022-06-10 DIAGNOSIS — Z86711 Personal history of pulmonary embolism: Secondary | ICD-10-CM | POA: Diagnosis not present

## 2022-06-10 DIAGNOSIS — Z9221 Personal history of antineoplastic chemotherapy: Secondary | ICD-10-CM | POA: Diagnosis not present

## 2022-06-10 DIAGNOSIS — D509 Iron deficiency anemia, unspecified: Secondary | ICD-10-CM | POA: Diagnosis not present

## 2022-06-10 HISTORY — PX: ABDOMINAL HYSTERECTOMY: SHX81

## 2022-06-11 ENCOUNTER — Ambulatory Visit: Payer: BC Managed Care – PPO

## 2022-06-17 MED FILL — Dexamethasone Sodium Phosphate Inj 100 MG/10ML: INTRAMUSCULAR | Qty: 1 | Status: AC

## 2022-06-18 ENCOUNTER — Inpatient Hospital Stay (HOSPITAL_BASED_OUTPATIENT_CLINIC_OR_DEPARTMENT_OTHER): Payer: BC Managed Care – PPO | Admitting: Oncology

## 2022-06-18 ENCOUNTER — Ambulatory Visit: Payer: BC Managed Care – PPO | Admitting: Oncology

## 2022-06-18 ENCOUNTER — Encounter: Payer: Self-pay | Admitting: Oncology

## 2022-06-18 ENCOUNTER — Ambulatory Visit: Payer: BC Managed Care – PPO

## 2022-06-18 ENCOUNTER — Inpatient Hospital Stay: Payer: BC Managed Care – PPO

## 2022-06-18 ENCOUNTER — Inpatient Hospital Stay (HOSPITAL_BASED_OUTPATIENT_CLINIC_OR_DEPARTMENT_OTHER): Payer: BC Managed Care – PPO | Admitting: Obstetrics and Gynecology

## 2022-06-18 VITALS — BP 133/94 | HR 100 | Temp 97.3°F | Resp 18 | Ht 65.0 in | Wt 210.0 lb

## 2022-06-18 DIAGNOSIS — I10 Essential (primary) hypertension: Secondary | ICD-10-CM | POA: Diagnosis not present

## 2022-06-18 DIAGNOSIS — Z7901 Long term (current) use of anticoagulants: Secondary | ICD-10-CM | POA: Diagnosis not present

## 2022-06-18 DIAGNOSIS — R5383 Other fatigue: Secondary | ICD-10-CM | POA: Diagnosis not present

## 2022-06-18 DIAGNOSIS — N920 Excessive and frequent menstruation with regular cycle: Secondary | ICD-10-CM | POA: Diagnosis not present

## 2022-06-18 DIAGNOSIS — Z5112 Encounter for antineoplastic immunotherapy: Secondary | ICD-10-CM | POA: Diagnosis not present

## 2022-06-18 DIAGNOSIS — C541 Malignant neoplasm of endometrium: Secondary | ICD-10-CM

## 2022-06-18 DIAGNOSIS — R000 Tachycardia, unspecified: Secondary | ICD-10-CM | POA: Diagnosis not present

## 2022-06-18 DIAGNOSIS — R590 Localized enlarged lymph nodes: Secondary | ICD-10-CM | POA: Diagnosis not present

## 2022-06-18 DIAGNOSIS — M4856XA Collapsed vertebra, not elsewhere classified, lumbar region, initial encounter for fracture: Secondary | ICD-10-CM | POA: Diagnosis not present

## 2022-06-18 DIAGNOSIS — Z86711 Personal history of pulmonary embolism: Secondary | ICD-10-CM | POA: Diagnosis not present

## 2022-06-18 DIAGNOSIS — M25511 Pain in right shoulder: Secondary | ICD-10-CM | POA: Diagnosis not present

## 2022-06-18 DIAGNOSIS — Z79899 Other long term (current) drug therapy: Secondary | ICD-10-CM | POA: Diagnosis not present

## 2022-06-18 DIAGNOSIS — D649 Anemia, unspecified: Secondary | ICD-10-CM | POA: Diagnosis not present

## 2022-06-18 DIAGNOSIS — G620 Drug-induced polyneuropathy: Secondary | ICD-10-CM | POA: Diagnosis not present

## 2022-06-18 LAB — COMPREHENSIVE METABOLIC PANEL
ALT: 11 U/L (ref 0–44)
AST: 21 U/L (ref 15–41)
Albumin: 3.6 g/dL (ref 3.5–5.0)
Alkaline Phosphatase: 47 U/L (ref 38–126)
Anion gap: 9 (ref 5–15)
BUN: 13 mg/dL (ref 6–20)
CO2: 24 mmol/L (ref 22–32)
Calcium: 8.9 mg/dL (ref 8.9–10.3)
Chloride: 102 mmol/L (ref 98–111)
Creatinine, Ser: 0.6 mg/dL (ref 0.44–1.00)
GFR, Estimated: >60 mL/min (ref >60)
Glucose, Bld: 98 mg/dL (ref 70–99)
Potassium: 3.4 mmol/L — ABNORMAL LOW (ref 3.5–5.1)
Sodium: 135 mmol/L (ref 135–145)
Total Bilirubin: 0.6 mg/dL (ref 0.3–1.2)
Total Protein: 7.5 g/dL (ref 6.5–8.1)

## 2022-06-18 LAB — CBC WITH DIFFERENTIAL/PLATELET
Abs Immature Granulocytes: 0.02 10*3/uL (ref 0.00–0.07)
Basophils Absolute: 0 10*3/uL (ref 0.0–0.1)
Basophils Relative: 1 %
Eosinophils Absolute: 0.3 10*3/uL (ref 0.0–0.5)
Eosinophils Relative: 6 %
HCT: 29.4 % — ABNORMAL LOW (ref 36.0–46.0)
Hemoglobin: 10.3 g/dL — ABNORMAL LOW (ref 12.0–15.0)
Immature Granulocytes: 0 %
Lymphocytes Relative: 33 %
Lymphs Abs: 1.6 10*3/uL (ref 0.7–4.0)
MCH: 29.5 pg (ref 26.0–34.0)
MCHC: 35 g/dL (ref 30.0–36.0)
MCV: 84.2 fL (ref 80.0–100.0)
Monocytes Absolute: 0.3 10*3/uL (ref 0.1–1.0)
Monocytes Relative: 6 %
Neutro Abs: 2.6 10*3/uL (ref 1.7–7.7)
Neutrophils Relative %: 54 %
Platelets: 276 10*3/uL (ref 150–400)
RBC: 3.49 MIL/uL — ABNORMAL LOW (ref 3.87–5.11)
RDW: 16.2 % — ABNORMAL HIGH (ref 11.5–15.5)
WBC: 4.7 10*3/uL (ref 4.0–10.5)
nRBC: 0 % (ref 0.0–0.2)

## 2022-06-18 LAB — TSH: TSH: 1.296 u[IU]/mL (ref 0.350–4.500)

## 2022-06-18 MED ORDER — FLUCONAZOLE 150 MG PO TABS: 150.0000 mg | ORAL_TABLET | Freq: Every day | ORAL | 0 refills | Status: DC

## 2022-06-18 MED ORDER — SODIUM CHLORIDE 0.9 % IV SOLN
10.0000 mg | Freq: Once | INTRAVENOUS | Status: AC
  Administered 2022-06-18: 10 mg via INTRAVENOUS
  Filled 2022-06-18: qty 10

## 2022-06-18 MED ORDER — HEPARIN SOD (PORK) LOCK FLUSH 100 UNIT/ML IV SOLN
INTRAVENOUS | Status: AC
  Administered 2022-06-18: 500 [IU]
  Filled 2022-06-18: qty 5

## 2022-06-18 MED ORDER — SODIUM CHLORIDE 0.9 % IV SOLN
200.0000 mg | Freq: Once | INTRAVENOUS | Status: AC
  Administered 2022-06-18: 200 mg via INTRAVENOUS
  Filled 2022-06-18: qty 8

## 2022-06-18 MED ORDER — SODIUM CHLORIDE 0.9 % IV SOLN
Freq: Once | INTRAVENOUS | Status: AC
  Filled 2022-06-18: qty 250

## 2022-06-18 MED ORDER — HEPARIN SOD (PORK) LOCK FLUSH 100 UNIT/ML IV SOLN
500.0000 [IU] | Freq: Once | INTRAVENOUS | Status: AC | PRN
  Filled 2022-06-18: qty 5

## 2022-06-18 NOTE — Progress Notes (Signed)
Nutrition Follow-up:  Patient with endometrial stage IV cancer.  Patient s/p hysterectomy and bilatreal salpingo-oophorectomy on 06/10/22.  Starting [place] today.  Met with patient during infusion.  Reports that she is eating well and doing well post op.  Has been drinking 2 protein shakes per day.      Medications: reviewed  Labs: reviewed  Anthropometrics:   Weight 210 lb  205 lb 6 oz on 4/10 214 lb on 3/13 220 lb on 2/21 217 lb on 1/31 244 lb on 12/6   NUTRITION DIAGNOSIS: Inadequate oral intake improved    INTERVENTION:  Continue foods high in protein Since appetite is good and patient has gained weight post op can reduce protein shake (30g) to 1 a day until healed from surgery.      MONITORING, EVALUATION, GOAL: weight trends, intake   NEXT VISIT: ~ 6 weeks during infusion  Ezekiah Massie B. Freida Busman, RD, LDN Registered Dietitian [phone] '

## 2022-06-18 NOTE — Progress Notes (Signed)
Hematology/Oncology Consult note [facility]  Telephone:([phone] [phone]  Patient Care Team: Pcp, No as PCP - General [name], RN as Oncology Nurse Navigator [name], MD as Medical Oncologist (Oncology)   Name of the patient: Jean Morris  [id number]  January 07, 1976   Date of visit: 06/18/22  Diagnosis- stage IVb endometrioid endometrial cancer       Chief complaint/ Reason for visit-on treatment assessment prior to cycle 2 of palliative Keytruda  Heme/Onc history: Patient is a 47 year old female who has seen Dr. Logan Bores for irregular menstrual bleeding.  He has had an IUD in place and initially it was attribute it to use of IUD and was tried to be controlled with progesterone agents.  However due to worsening abdominal pain patient underwent a CT abdomen and pelvis with contrast on 12/25/2021 which showed 8 mm   Posterior lung base mass.  Retroperitoneal adenopathy measuring 2.6 cm additional retrocrural adenopathy.  Faint lucent lesion involving L2 vertebral body.  The uterus is anteverted and enlarged.  Endometrium is enlarged heterogeneous and irregular extending to the fundal myometrium.  Findings consistent with endometrial malignancy.  CT chest without contrast showed a 10 mm right lower lobe subpleural nodule.   Patient had endometrial biopsy which was consistent FIGO grade 2 endometrioid endometrial carcinoma.  Patient was seen by GYN oncology and hopes to take her for surgery if she has good response to neoadjuvant chemotherapy.MSI unstable.  BRAF negative.  Loss of major and minor MMR proteins MLH1 and PMS2 less than 5% of tumor expression.  MLH1 hyper methylation present.  This is most likely due to somatic epigenetic modification which can be seen in about 77% of sporadic endometrial cancers.   Patient completed 6 cycles of CarboTaxol Keytruda chemotherapy and is currently on maintenance Keytruda.  PET CT scan on 05/19/2022  showed enlarged uterus with hypermetabolic endometrial thickening.  Improving left periaortic nodal metastases.  No definitive bone metastases was noted.  Plan is to proceed with cytoreductive surgery at [facility].  Patient underwent hysterectomy and bilateral salpingo-oophorectomy on 06/10/2022.  Final pathology showed residual endometrioid adenocarcinoma of the endometrium FIGO grade 2 with marked treatment effect invading 18 mm and a 34 mm thick myometrium.  Fallopian tubes and ovaries were negative for malignancy.  Pelvic washings were negative for malignancy.    Interval history-patient is recovering well from her surgery.  She is trying to keep herself active and ambulate.  She does report some soreness at the site of surgery.  ECOG PS- 1 Pain scale- 2 Opioid associated constipation- no  Review of systems- Review of Systems  Constitutional:  Positive for malaise/fatigue. Negative for chills, fever and weight loss.  HENT:  Negative for congestion, ear discharge and nosebleeds.   Eyes:  Negative for blurred vision.  Respiratory:  Negative for cough, hemoptysis, sputum production, shortness of breath and wheezing.   Cardiovascular:  Negative for chest pain, palpitations, orthopnea and claudication.  Gastrointestinal:  Negative for abdominal pain, blood in stool, constipation, diarrhea, heartburn, melena, nausea and vomiting.  Genitourinary:  Negative for dysuria, flank pain, frequency, hematuria and urgency.  Musculoskeletal:  Negative for back pain, joint pain and myalgias.  Skin:  Negative for rash.  Neurological:  Negative for dizziness, tingling, focal weakness, seizures, weakness and headaches.  Endo/Heme/Allergies:  Does not bruise/bleed easily.  Psychiatric/Behavioral:  Negative for depression and suicidal ideas. The patient does not have insomnia.       Allergies  Allergen Reactions  Bee Pollen Nausea And Vomiting    Itchy, swelling, watery eyes, runny nose.   Pollen Extract Nausea  And Vomiting    Itchy, swelling, watery eyes, runny nose.     Past Medical History:  Diagnosis Date   Anemia    Endometrial cancer    Hypertension    Menorrhagia    Pulmonary embolism      Past Surgical History:  Procedure Laterality Date   CESAREAN SECTION  1994   COLONOSCOPY WITH PROPOFOL N/A 08/06/2021   Procedure: COLONOSCOPY WITH PROPOFOL;  Surgeon: Wyline Mood, MD;  Location: [facility] ENDOSCOPY;  Service: Gastroenterology;  Laterality: N/A;   ESOPHAGOGASTRODUODENOSCOPY (EGD) WITH PROPOFOL N/A 02/09/2020   Procedure: ESOPHAGOGASTRODUODENOSCOPY (EGD) WITH PROPOFOL;  Surgeon: Wyline Mood, MD;  Location: [facility] ENDOSCOPY;  Service: Gastroenterology;  Laterality: N/A;   IR IMAGING GUIDED PORT INSERTION  01/17/2022    Social History   Socioeconomic History   Marital status: Media planner    Spouse name: Not on file   Number of children: Not on file   Years of education: Not on file   Highest education level: Not on file  Occupational History   Not on file  Tobacco Use   Smoking status: Never   Smokeless tobacco: Never  Vaping Use   Vaping Use: Never used  Substance and Sexual Activity   Alcohol use: No   Drug use: No   Sexual activity: Not Currently    Birth control/protection: I.U.D.    Comment: patient states MD said did not see in CT today 01/17/2022  Other Topics Concern   Not on file  Social History Narrative   Lives with [facility], partner, and no pets.   Social Determinants of Health   Financial Resource Strain: Not on file  Food Insecurity: Not on file  Transportation Needs: Not on file  Physical Activity: Not on file  Stress: Not on file  Social Connections: Not on file  Intimate Partner Violence: Not on file    Family History  Problem Relation Age of Onset   Hypertension Mother    Stroke Mother    Heart attack Mother    Cancer Father    Hypertension Father    Cancer Sister    Cancer Sister    Cancer Brother    Hypertension Other       Current Outpatient Medications:    acetaminophen (TYLENOL) 650 MG CR tablet, Take by mouth., Disp: , Rfl:    apixaban (ELIQUIS) 2.5 MG TABS tablet, Take 1 tablet (2.5 mg total) by mouth 2 (two) times daily., Disp: 60 tablet, Rfl: 3   benzonatate (TESSALON) 100 MG capsule, Take 1 capsule (100 mg total) by mouth 3 (three) times daily as needed for cough., Disp: 20 capsule, Rfl: 0   dexamethasone (DECADRON) 4 MG tablet, Take 2 tablets by mouth starting the day after chemotherapy for 3 days. Take with food., Disp: 30 tablet, Rfl: 1   HYDROcodone-acetaminophen (NORCO/VICODIN) 5-325 MG tablet, Take by mouth., Disp: , Rfl:    lidocaine-prilocaine (EMLA) cream, Apply to port site as directed, Disp: 30 g, Rfl: 3   LORazepam (ATIVAN) 0.5 MG tablet, Take 1 tablet (0.5 mg total) by mouth every 8 (eight) hours., Disp: 30 tablet, Rfl: 0   magnesium hydroxide (MILK OF MAGNESIA) 400 MG/5ML suspension, Take 15 mLs by mouth daily as needed for moderate constipation. Take if no bowel movement for 2 days., Disp: , Rfl:    methocarbamol (ROBAXIN) 500 MG tablet, Take 1 tablet (500 mg total)  by mouth 2 (two) times daily as needed for muscle spasms., Disp: 30 tablet, Rfl: 0   nystatin (MYCOSTATIN/NYSTOP) powder, Apply 1 Application topically 3 (three) times daily., Disp: 30 g, Rfl: 3   oxyCODONE (OXY IR/ROXICODONE) 5 MG immediate release tablet, Take 1 tablet (5 mg total) by mouth every 4 (four) hours as needed for severe pain., Disp: 120 tablet, Rfl: 0   pantoprazole (PROTONIX) 40 MG tablet, Take 1 tablet (40 mg total) by mouth daily., Disp: 30 tablet, Rfl: 2   polyethylene glycol (MIRALAX) 17 g packet, Take 17 g by mouth daily. To prevent constipation, Disp: 30 each, Rfl: 3   potassium chloride SA (KLOR-CON M) 20 MEQ tablet, Take 1 tablet (20 mEq total) by mouth 2 (two) times daily. Dissolve tablets, Disp: 60 tablet, Rfl: 2   senna (SENOKOT) 8.6 MG TABS tablet, Take 1-2 tablets (8.6-17.2 mg total) by mouth daily.  To prevent constipation, Disp: 120 tablet, Rfl: 0   clotrimazole-betamethasone (LOTRISONE) cream, Apply topically. (Patient not taking: Reported on 06/18/2022), Disp: , Rfl:    levonorgestrel (MIRENA) 20 MCG/24HR IUD, 1 Intra Uterine Device (1 each total) by Intrauterine route once. (Patient not taking: Reported on 06/18/2022), Disp: 1 each, Rfl: 0   ondansetron (ZOFRAN) 8 MG tablet, Take 1 tablet (8 mg total) by mouth every 8 (eight) hours as needed for nausea or vomiting. Start on the third day after chemotherapy. (Patient not taking: Reported on 06/18/2022), Disp: 30 tablet, Rfl: 1   prochlorperazine (COMPAZINE) 10 MG tablet, Take 1 tablet (10 mg total) by mouth every 6 (six) hours as needed for nausea or vomiting. (Patient not taking: Reported on 06/18/2022), Disp: 30 tablet, Rfl: 1  Physical exam:  Vitals:   06/18/22 0934  BP: (!) 133/94  Pulse: 100  Resp: 18  Temp: (!) 97.3 F (36.3 C)  TempSrc: Tympanic  SpO2: 100%  Weight: 210 lb (95.3 kg)  Height: 5\' 5"  (1.651 m)   Physical Exam Cardiovascular:     Rate and Rhythm: Regular rhythm. Tachycardia present.     Heart sounds: Normal heart sounds.  Pulmonary:     Effort: Pulmonary effort is normal.     Breath sounds: Normal breath sounds.  Abdominal:     General: Bowel sounds are normal.     Palpations: Abdomen is soft.     Comments: Laparoscopic scar of recent surgery is healing well with no signs of infection  Skin:    General: Skin is warm and dry.  Neurological:     Mental Status: She is alert and oriented to person, place, and time.         Latest Ref Rng & Units 06/18/2022    9:18 AM  CMP  Glucose 70 - 99 mg/dL 98   BUN 6 - 20 mg/dL 13   Creatinine 1.61 - 1.00 mg/dL 0.96   Sodium 045 - 409 mmol/L 135   Potassium 3.5 - 5.1 mmol/L 3.4   Chloride 98 - 111 mmol/L 102   CO2 22 - 32 mmol/L 24   Calcium 8.9 - 10.3 mg/dL 8.9   Total Protein 6.5 - 8.1 g/dL 7.5   Total Bilirubin 0.3 - 1.2 mg/dL 0.6   Alkaline Phos 38 - 126  U/L 47   AST 15 - 41 U/L 21   ALT 0 - 44 U/L 11       Latest Ref Rng & Units 06/18/2022    9:18 AM  CBC  WBC 4.0 - 10.5 K/uL 4.7  Hemoglobin 12.0 - 15.0 g/dL 16.1   Hematocrit 09.6 - 46.0 % 29.4   Platelets 150 - 400 K/uL 276     No images are attached to the encounter.  NM PET Image Initial (PI) Skull Base To Thigh  Result Date: 05/21/2022 CLINICAL DATA:  Subsequent treatment strategy for endometrial cancer. Status post chemotherapy, evaluate for possible surgery. EXAM: NUCLEAR MEDICINE PET SKULL BASE TO THIGH TECHNIQUE: 11.9 mCi F-18 FDG was injected intravenously. Full-ring PET imaging was performed from the skull base to thigh after the radiotracer. CT data was obtained and used for attenuation correction and anatomic localization. Fasting blood glucose: 82 mg/dl COMPARISON:  CT chest abdomen pelvis dated 03/17/2022. FINDINGS: Mediastinal blood pool activity: SUV max 2.8 Liver activity: SUV max NA NECK: Small bilateral level 2 cervical nodes measuring up to 6 mm short axis (series 4/image 20), max SUV 3.8 on the left. Incidental CT findings: None. CHEST: Small right axillary nodes measuring up to 6 mm short axis inferiorly (series 4/image 53), max SUV 3.0. Small left axillary/intramammary nodes measuring up to 8 mm short axis inferiorly (series 4/image 58), max SUV 4.9. No hypermetabolic mediastinal lymphadenopathy. No hypermetabolic pulmonary nodules. Right chest port terminates at the cavoatrial junction. Incidental CT findings: None. ABDOMEN/PELVIS: Abnormal soft tissue in the left periaortic region measuring 1.6 x 2.9 cm (series 4/image 99), max SUV 3.1. Adjacent discrete 9 mm short axis left para-aortic node (series 4/image 99), max SUV 5.8. When compared to the prior, the dominant left periaortic nodal soft tissue has improved. Small bilateral inferior inguinal nodes measuring up to 6 mm short axis on the left (series 4/image 153), max SUV 2.2. Enlarged uterus with endometrial thickening,  corresponding to the patient's known endometrial cancer. Associated central endometrial hypermetabolism, max SUV 4.7. Incidental CT findings: None. SKELETON: No focal hypermetabolic activity to suggest skeletal metastasis. Incidental CT findings: Degenerative changes of the lumbar spine with known L2 compression fracture deformity. IMPRESSION: Enlarged uterus with hypermetabolic endometrial thickening, corresponding to the patient's known endometrial cancer. Improving left periaortic nodal metastases, as above. Additional small cervical, axillary, and inguinal nodes likely reflect nodal metastases, although low-grade lymphoma is also possible. Electronically Signed   By: Charline Bills M.D.   On: 05/21/2022 01:13     Assessment and plan- Patient is a 47 y.o. female  with stage IV endometrioid endometrial carcinoma T2 N2 M1.  She is here for on treatment assessment prior to cycle 2 of palliative Keytruda  Pretreatment CT scan showed abnormal appearing uterus as well as retroperitoneal and retrocrural adenopathy consistent with metastatic disease.  There was concern for uptake in the L5 vertebral body but this was not noted on the PET scan.  I discussed the results of pathology with the patient in detail.  Patient underwent cytoreductive hysterectomy and bilateral salpingo-oophorectomy which showed residual endometrioid endometrial carcinoma invading the myometrium.  Ovaries and fallopian tubes as well as pelvic washings were negative for malignancy.  Her most recent PET scan showed decreasing size of periaortic adenopathy.  Plan is therefore to continue palliative Keytruda until progression or toxicity.  She will proceed with cycle 2 of palliative Keytruda today and I will see her back in 3 weeks for cycle 3.  Patient is on low-dose Eliquis 2.5 mg twice a day for prophylaxis which she will continue probably for the next 3 months and following that I will consider stopping it given that her disease burden is  overall low and patient is ambulating better.   Visit  Diagnosis 1. Endometrial cancer   2. Encounter for antineoplastic immunotherapy      Dr. Owens Shark, MD, MPH [facility] [phone] 06/18/2022 10:10 AM

## 2022-06-18 NOTE — Progress Notes (Signed)
Patient has some itching in between her legs she has changed soap.

## 2022-06-18 NOTE — Patient Instructions (Signed)
[facility]  Discharge Instructions: Thank you for choosing [facility] to provide your oncology and hematology care.  If you have a lab appointment with the Cancer Center, please go directly to the Cancer Center and check in at the registration area.  Wear comfortable clothing and clothing appropriate for easy access to any Portacath or PICC line.   We strive to give you quality time with your provider. You may need to reschedule your appointment if you arrive late (15 or more minutes).  Arriving late affects you and other patients whose appointments are after yours.  Also, if you miss three or more appointments without notifying the office, you may be dismissed from the clinic at the provider's discretion.      For prescription refill requests, have your pharmacy contact our office and allow 72 hours for refills to be completed.    Today you received the following chemotherapy and/or immunotherapy agents- Keytruda      To help prevent nausea and vomiting after your treatment, we encourage you to take your nausea medication as directed.  BELOW ARE SYMPTOMS THAT SHOULD BE REPORTED IMMEDIATELY: *FEVER GREATER THAN 100.4 F (38 C) OR HIGHER *CHILLS OR SWEATING *NAUSEA AND VOMITING THAT IS NOT CONTROLLED WITH YOUR NAUSEA MEDICATION *UNUSUAL SHORTNESS OF BREATH *UNUSUAL BRUISING OR BLEEDING *URINARY PROBLEMS (pain or burning when urinating, or frequent urination) *BOWEL PROBLEMS (unusual diarrhea, constipation, pain near the anus) TENDERNESS IN MOUTH AND THROAT WITH OR WITHOUT PRESENCE OF ULCERS (sore throat, sores in mouth, or a toothache) UNUSUAL RASH, SWELLING OR PAIN  UNUSUAL VAGINAL DISCHARGE OR ITCHING   Items with * indicate a potential emergency and should be followed up as soon as possible or go to the Emergency Department if any problems should occur.  Please show the CHEMOTHERAPY ALERT CARD or IMMUNOTHERAPY ALERT CARD at check-in to  the Emergency Department and triage nurse.  Should you have questions after your visit or need to cancel or reschedule your appointment, please contact [facility]  [phone] and follow the prompts.  Office hours are 8:00 a.m. to 4:30 p.m. Monday - Friday. Please note that voicemails left after 4:00 p.m. may not be returned until the following business day.  We are closed weekends and major holidays. You have access to a nurse at all times for urgent questions. Please call the main number to the clinic [phone] and follow the prompts.  For any non-urgent questions, you may also contact your provider using MyChart. We now offer e-Visits for anyone 18 and older to request care online for non-urgent symptoms. For details visit [email or web address].   Also download the MyChart app! Go to the app store, search "MyChart", open the app, select [place], and log in with your MyChart username and password.   

## 2022-06-18 NOTE — Progress Notes (Signed)
Gynecologic Oncology Interval Visit   Referring Provider: Dr. Logan Bores & Dr Cathie Hoops  Chief Complaint: Endometrial adenocarcinoma, stage IVB  Subjective:  Jean Morris is a 47 y.o. female who is seen in consultation from Dr. Logan Bores for irregular bleeding for many years, diagnosed with Stage IVb endometrioid endometrial cancer currently s/p 6 cycles of neoadjuvant carboplatin with paclitaxel and keytruda with single agent keytruda for cycle 7, now s/p TLH BSO at [facility] with Dr Johnnette Litter on 06/10/22 who presents to clinic for wound check and vaginal irritation.    01/21/22 D1C1 Carbo-paclitaxel-keytruda 02/12/22 D1C2 Carbo-paclitaxel-keytruda 03/05/22 D1C3 Carbo-paclitaxel-keytruda 03/26/22 D1C4 Carbo-paclitaxel-keytruda 04/16/22 D1C5 Carbo-paclitaxel-keytruda 05/07/2022-  D1C6 carbo-paclitaxel-pembrolizumab. 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 Zometa 4 mg added.  05/28/22-  D1C7 - pembrolizumab 05/19/2022 PET - enlarged uterus is hypermetabolic endometrial thickening consistent with known endometrial cancer. Improving left periaortic nodal metastases. Addtiional small cervical, axillary, and inguinal nodes reflective of nodal metastases.    06/10/22- TLH BSO at [facility] with Dr. Johnnette Litter.  Pathology: A.  Uterus, cervix, bilateral fallopian tubes and ovaries, hysterectomy, bilateral salpingo-oophorectomy: Residual endometrioid adenocarcinoma of the endometrium (0.6 cm), FIGO grade 2 of 3, with marked treatment effect, invading 18 mm in a 34 mm thick myometrium. 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 Cervix: Negative for malignancy. Serosa: Negative malignancy. Right and left ovaries: Negative for malignancy. Right and left fallopian tubes: Negative malignancy. Microsatellite instability and mismatch repair protein immunohistochemistry: Not performed (obtained on prior specimen). P53 immunohistochemistry: Not performed  A: Pelvic Washings: negative. No evidence of malignancy.      She has restarted prophylactic eliquis. She is seeing Dr Smith Robert today for consideration of restarting chemotherapy.      Gynecologic Oncology History:  Patient presented to her OB/GYN for irregular bleeding for several months.  She was thought to have an IUD in place and her bleeding was attempted to be controlled with progesterone agents.  Medical history significant for PE in [facility] in 02/2015  12/25/21- CT C/A/P-  for left lower quadrant abdominal pain showed enlarged uterus with thickened endometrium, heterogeneous and irregular extending to the fundal myometrium.  Retroperitoneal and retrocrural adenopathy.  Faint lucent lesion in left L2 vertebrae suspicious for metastasis.  Posterior right lower lobe subpleural nodule measuring 10 mm with surrounding groundglass opacity; suspicious for metastasis though was present on 08/2015 CT but measured 5 mm.   Pelvic ultrasound- Uterus-19 x 13.3 x 15 cm = volume 1974 mls.  Distinct endometrium not visible.  Heterogeneous area which may be surrounded by myometrium may be as much is 7 cm in greatest thickness. Endometrium-thickness, perhaps cystic is 75 mm.  Markedly heterogeneous with signs of internal flow. Right ovary-not visualized Left ovary-5.8 x 3.1 x 4.2 cm.  Simple dominant follicle measuring up to 3.5 cm accounts for much of the left ovarian dimension.  Pulse Doppler evaluation shows low resistance arterial and venous flow within the left ovary Other-small free fluid in the pelvis.  Endometrial biopsy- -endometrioid adenocarcinoma, FIGO grade 2  She was started on norethindrone by Dr. Logan Bores.   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 She was taking oral contraceptives at that time with increased dosing d/t heavy menstrual periods. Additionally, hx of iron deficiency and has taken oral iron. Given extent of disease and VTE neoaduvant therapy was recommended.    Treatment Summary:  01/21/22- D1C1-  carboplatin 02/12/22- D1C2 carbo-paclitaxel-pembrolizumab 03/05/22- D1C3 carbo-paclitaxel-pembrolizumab   03/17/22- CT Chest Abdomen Pelvis W Contrast IMPRESSION: 1. Persistent heterogeneous  irregularity of the endometrium with uterine enlargement compatible with known endometrial malignancy.  2. New heterogeneity of the 2.8 cm left ovarian lesion, nonspecific but possibly reflecting metastatic disease. Consider further evaluation with pelvic ultrasound. 3. Overall decreased retrocrural and retroperitoneal adenopathy. However, there is interval increase in size and adjacent infiltrative/desmoplastic stranding adjacent to a centrally necrotic left periaortic lymph node/lymph node conglomerate. 4. Continued decrease in size of the posterior right lower lobe pulmonary nodule. 5. New pathologic compression deformity of the L2 vertebral body. 6. Diffuse hepatic steatosis. 7. Mild diffuse bronchial wall thickening with mosaic attenuation of the lungs, suggestive of small airways disease.  She saw Dr Smith Robert on 03/26/22 for results. She was asymptomatic of back pain at that time. Dr Smith Robert considered palliative radiation, however, patient was asymptomatic so it was held. She is awaiting dental clearance for possible bisphosphonates.   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  BRAF V600E not present. The tumor cells are NEGATIVE for Her2 (1+). Estrogen Receptor:       POSITIVE, 90%, MODERATE STAINING INTENSITY Progesterone Receptor:   POSITIVE, 80%, STRONG STAINING INTENSITY   Problem List: Patient Active Problem List   Diagnosis Date Noted   Hypokalemia 04/01/2022   Endometrial cancer 01/15/2022   Iron deficiency anemia 01/15/2022   Morbid obesity with BMI of 60.0-69.9, adult 04/18/2015   Abnormal uterine bleeding 04/18/2015   Anemia 04/18/2015   Pulmonary embolism  03/11/2015    Past Medical History: Past Medical History:  Diagnosis Date   Anemia    Endometrial cancer    Hypertension    Menorrhagia    Pulmonary embolism     Past Surgical History: Past Surgical History:  Procedure Laterality Date   CESAREAN SECTION  1994   COLONOSCOPY WITH PROPOFOL N/A 08/06/2021   Procedure: COLONOSCOPY WITH PROPOFOL;  Surgeon: Wyline Mood, MD;  Location: [facility] ENDOSCOPY;  Service: Gastroenterology;  Laterality: N/A;   ESOPHAGOGASTRODUODENOSCOPY (EGD) WITH PROPOFOL N/A 02/09/2020   Procedure: ESOPHAGOGASTRODUODENOSCOPY (EGD) WITH PROPOFOL;  Surgeon: Wyline Mood, MD;  Location: [facility] ENDOSCOPY;  Service: Gastroenterology;  Laterality: N/A;   IR IMAGING GUIDED PORT INSERTION  01/17/2022    Past Gynecologic History:  Menarche: age 34, last 5 days History of OCP/HRT use: yes Last pap:  03/14/21- ASCUS, HPV negative History of STDs: The patient denies history of sexually transmitted disease. Sexually active: yes; not currently  OB History:  OB History  Gravida Para Term Preterm AB Living  3 3 3     3   SAB IAB Ectopic Multiple Live Births          3    # Outcome Date GA Lbr Len/2nd Weight Sex Delivery Anes PTL Lv  3 Term     M CS-LTranv   LIV  2 Term     M Vag-Spont   LIV  1 Term     F Vag-Spont   LIV   Family History: Family History  Problem Relation Age of Onset   Hypertension Mother    Stroke Mother    Heart attack Mother    Cancer Father    Hypertension Father    Cancer Sister    Cancer Sister    Cancer Brother    Hypertension Other    Social History: Social History   Socioeconomic History   Marital status: Media planner    Spouse name: Not on file   Number of children: Not on file  Years of education: Not on file   Highest education level: Not on file  Occupational History   Not on file  Tobacco Use   Smoking status: Never   Smokeless tobacco: Never  Vaping Use   Vaping Use: Never used  Substance and Sexual Activity    Alcohol use: No   Drug use: No   Sexual activity: Not Currently    Birth control/protection: I.U.D.    Comment: patient states MD said did not see in CT today 01/17/2022  Other Topics Concern   Not on file  Social History Narrative   Lives with [facility], partner, and no pets.   Social Determinants of Health   Financial Resource Strain: Not on file  Food Insecurity: Not on file  Transportation Needs: Not on file  Physical Activity: Not on file  Stress: Not on file  Social Connections: Not on file  Intimate Partner Violence: Not on file   Allergies: Allergies  Allergen Reactions   Bee Pollen Nausea And Vomiting    Itchy, swelling, watery eyes, runny nose.   Pollen Extract Nausea And Vomiting    Itchy, swelling, watery eyes, runny nose.   Current Medications: Current Outpatient Medications  Medication Sig Dispense Refill   acetaminophen (TYLENOL) 650 MG CR tablet Take by mouth.     apixaban (ELIQUIS) 2.5 MG TABS tablet Take 1 tablet (2.5 mg total) by mouth 2 (two) times daily. 60 tablet 3   benzonatate (TESSALON) 100 MG capsule Take 1 capsule (100 mg total) by mouth 3 (three) times daily as needed for cough. 20 capsule 0   dexamethasone (DECADRON) 4 MG tablet Take 2 tablets by mouth starting the day after chemotherapy for 3 days. Take with food. 30 tablet 1   fluconazole (DIFLUCAN) 150 MG tablet Take 1 tablet (150 mg total) by mouth daily. Take 1 tablet (150 mg) by mouth. Three days later, take second tablet. 2 tablet 0   HYDROcodone-acetaminophen (NORCO/VICODIN) 5-325 MG tablet Take by mouth.     lidocaine-prilocaine (EMLA) cream Apply to port site as directed 30 g 3   LORazepam (ATIVAN) 0.5 MG tablet Take 1 tablet (0.5 mg total) by mouth every 8 (eight) hours. 30 tablet 0   magnesium hydroxide (MILK OF MAGNESIA) 400 MG/5ML suspension Take 15 mLs by mouth daily as needed for moderate constipation. Take if no bowel movement for 2 days.     methocarbamol (ROBAXIN) 500 MG tablet Take  1 tablet (500 mg total) by mouth 2 (two) times daily as needed for muscle spasms. 30 tablet 0   nystatin (MYCOSTATIN/NYSTOP) powder Apply 1 Application topically 3 (three) times daily. 30 g 3   oxyCODONE (OXY IR/ROXICODONE) 5 MG immediate release tablet Take 1 tablet (5 mg total) by mouth every 4 (four) hours as needed for severe pain. 120 tablet 0   pantoprazole (PROTONIX) 40 MG tablet Take 1 tablet (40 mg total) by mouth daily. 30 tablet 2   polyethylene glycol (MIRALAX) 17 g packet Take 17 g by mouth daily. To prevent constipation 30 each 3   potassium chloride SA (KLOR-CON M) 20 MEQ tablet Take 1 tablet (20 mEq total) by mouth 2 (two) times daily. Dissolve tablets 60 tablet 2   senna (SENOKOT) 8.6 MG TABS tablet Take 1-2 tablets (8.6-17.2 mg total) by mouth daily. To prevent constipation 120 tablet 0   clotrimazole-betamethasone (LOTRISONE) cream Apply topically. (Patient not taking: Reported on 06/18/2022)     levonorgestrel (MIRENA) 20 MCG/24HR IUD 1 Intra Uterine Device (1 each  total) by Intrauterine route once. (Patient not taking: Reported on 06/18/2022) 1 each 0   ondansetron (ZOFRAN) 8 MG tablet Take 1 tablet (8 mg total) by mouth every 8 (eight) hours as needed for nausea or vomiting. Start on the third day after chemotherapy. (Patient not taking: Reported on 06/18/2022) 30 tablet 1   prochlorperazine (COMPAZINE) 10 MG tablet Take 1 tablet (10 mg total) by mouth every 6 (six) hours as needed for nausea or vomiting. (Patient not taking: Reported on 06/18/2022) 30 tablet 1   No current facility-administered medications for this visit.   Facility-Administered Medications Ordered in Other Visits  Medication Dose Route Frequency Provider Last Rate Last Admin   dexamethasone (DECADRON) 10 mg in sodium chloride 0.9 % 50 mL IVPB  10 mg Intravenous Once [name], MD 204 mL/hr at 06/18/22 1045 10 mg at 06/18/22 1045   heparin lock flush 100 unit/mL  500 Units Intracatheter Once PRN [name], MD       pembrolizumab [facility]) 200 mg in sodium chloride 0.9 % 50 mL chemo infusion  200 mg Intravenous Once [name], MD        Review of Systems General: fatigue  HEENT: no complaints  Lungs: shortness of breath/cough  Cardiac: no complaints  VW:[id number] oain from incisions  GU: vulvar irritation and itching   Musculoskeletal: back pain  Extremities: no complaints  Skin: no complaints  Neuro: no complaints  Endocrine: no complaints  Psych: no complaints       Objective:  Physical Examination:  BP (!) 133/94   Pulse 100   Temp (!) 97.3 F (36.3 C)   Resp 18   Ht 5\' 5"  (1.651 m)   Wt 210 lb (95.3 kg)   LMP 02/17/2022 (Approximate)   BMI 34.95 kg/m     ECOG Performance Status: 2 - Symptomatic, <50% confined to bed  GENERAL: Patient is a well appearing female in no acute distress; she walked into clinic which is an improvement HEENT:  PERRL, neck supple with midline trachea.   LUNGS:  Normal respiratory effort ABDOMEN:  Soft, nontender. Nondistended.   EXTREMITIES:  No peripheral edema.   SKIN:  Clear with no obvious rashes. Incisions all well healed NEURO:  Nonfocal. Well oriented.  Appropriate affect.  Pelvic: EGBUS: slight erythema with white "yeast-like" discharge.   Labs Lab Results  Component Value Date   WBC 4.7 06/18/2022   HGB 10.3 (L) 06/18/2022   HCT 29.4 (L) 06/18/2022   MCV 84.2 06/18/2022   PLT 276 06/18/2022     Chemistry      Component Value Date/Time   NA 135 06/18/2022 0918   NA 139 10/23/2021 0940   NA 136 06/15/2013 1946   K 3.4 (L) 06/18/2022 0918   K 3.4 (L) 06/15/2013 1946   CL 102 06/18/2022 0918   CL 104 06/15/2013 1946   CO2 24 06/18/2022 0918   CO2 25 06/15/2013 1946   BUN 13 06/18/2022 0918   BUN 7 10/23/2021 0940   BUN 7 06/15/2013 1946   CREATININE 0.60 06/18/2022 0918   CREATININE 0.62 04/21/2022 1311   CREATININE 0.94 06/15/2013 1946      Component Value Date/Time   CALCIUM 8.9 06/18/2022 0918    CALCIUM 8.8 06/15/2013 1946   ALKPHOS 47 06/18/2022 0918   ALKPHOS 81 04/08/2013 1258   AST 21 06/18/2022 0918   AST 39 04/21/2022 1311   ALT 11 06/18/2022 0918   ALT 28 04/21/2022 1311  ALT 16 04/08/2013 1258   BILITOT 0.6 06/18/2022 0918   BILITOT 1.1 04/21/2022 1311     Lab Results  Component Value Date   IRON 59 04/16/2022   TIBC 137 (L) 04/16/2022   FERRITIN 287 04/16/2022   Lab Results  Component Value Date   VITAMINB12 875 04/16/2022   Lab Results  Component Value Date   FOLATE 10.7 01/15/2022   05/07/2022 Albumin 3.0    IMAGING:   PET SCAn 05/19/2022 05/19/2022 PET    COMPARISON:  CT chest abdomen pelvis dated 03/17/2022.   FINDINGS: Mediastinal blood pool activity: SUV max 2.8   Liver activity: SUV max NA   NECK: Small bilateral level 2 cervical nodes measuring up to 6 mm short axis (series 4/image 20), max SUV 3.8 on the left.   Incidental CT findings: None.   CHEST: Small right axillary nodes measuring up to 6 mm short axis inferiorly (series 4/image 53), max SUV 3.0.   Small left axillary/intramammary nodes measuring up to 8 mm short axis inferiorly (series 4/image 58), max SUV 4.9.   No hypermetabolic mediastinal lymphadenopathy.   No hypermetabolic pulmonary nodules.   Right chest port terminates at the cavoatrial junction.   Incidental CT findings: None.   ABDOMEN/PELVIS: Abnormal soft tissue in the left periaortic region measuring 1.6 x 2.9 cm (series 4/image 99), max SUV 3.1. Adjacent discrete 9 mm short axis left para-aortic node (series 4/image 99), max SUV 5.8. When compared to the prior, the dominant left periaortic nodal soft tissue has improved.   Small bilateral inferior inguinal nodes measuring up to 6 mm short axis on the left (series 4/image 153), max SUV 2.2.   Enlarged uterus with endometrial thickening, corresponding to the patient's known endometrial cancer. Associated central endometrial hypermetabolism, max SUV  4.7.   Incidental CT findings: None.   SKELETON: No focal hypermetabolic activity to suggest skeletal metastasis.   Incidental CT findings: Degenerative changes of the lumbar spine with known L2 compression fracture deformity.   IMPRESSION: Enlarged uterus with hypermetabolic endometrial thickening, corresponding to the patient's known endometrial cancer.   Improving left periaortic nodal metastases, as above.   Additional small cervical, axillary, and inguinal nodes likely reflect nodal metastases, although low-grade lymphoma is also possible.            Assessment:  Jean Morris is a 47 y.o. female with PMB, anemia and large uterus diagnosed with figo grade 2 endometrial cancer on endometrial biopsy 11/23.  Stage IVB based on suspicious lung lesion, L2 vertebral lesion, significantly enlarged left aortic adenopathy consistent with metastatic disease on imaging. Improved disease control s/p 6 cycles of therapy s/p TLH BSO for disease control with excellent recovery.   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  BRAF V600E not present. The tumor cells are NEGATIVE for Her2 (1+). Estrogen Receptor:       POSITIVE, 90%, MODERATE STAINING INTENSITY Progesterone Receptor:   POSITIVE, 80%, STRONG STAINING INTENSITY  Anemia, with prior normal iron, B12, and folate with low decreased TIBC and Tsats.   Postop vulvar yeast infection  Hypokalemia, asymptomatic  Prior abnormal EKG 02/2022  Body mass index is 34.95 kg/m.  History of PE currently on Eliquis  Medical co-morbidities complicating care: HTN, Body mass index is 34.95 kg/m., prior surgery, PE while taking OCs. Plan:   Problem List Items Addressed This Visit       Genitourinary   Endometrial cancer - Primary   Relevant Medications   fluconazole (  DIFLUCAN) 150 MG tablet      Recommend continuing pembrolizumab maintenance with Dr Smith Robert her Medical Oncologist.  She will receive  treatment today.   After surgery consideration can be made for the role of radiation therapy for local nodal basin control. Awaiting Tumor Board.   If she develops progressive disease it would be an option to continue Pembrolizumab and add Lenvatanib.  In view of the endometrioid histology and positive ER/PR expression she could also be a candidate for a hormonal regimen such as alternating Megace and Tamoxifen or Everolimus/Letrozole.    She enrolled on the [employer] consortium study.   The patient's diagnosis, an outline of the further diagnostic and laboratory studies which will be required, the recommendation for surgery, and alternatives were discussed with her and her accompanying family members.  All questions were answered to their satisfaction.  I personally interviewed and examined the patient. Agreed with the above/below plan of care. I have directly contributed to assessment and plan of care of this patient and educated and discussed with patient and family. Consuello Masse, NP scribed the note.   Kelsee Preslar Leta Jungling, MD

## 2022-06-18 NOTE — Patient Instructions (Signed)

## 2022-06-19 ENCOUNTER — Other Ambulatory Visit: Payer: Self-pay

## 2022-06-24 ENCOUNTER — Other Ambulatory Visit: Payer: Self-pay | Admitting: *Deleted

## 2022-06-24 NOTE — Telephone Encounter (Signed)
Ok to refill pain meds

## 2022-06-25 ENCOUNTER — Encounter: Payer: Self-pay | Admitting: Oncology

## 2022-06-25 ENCOUNTER — Encounter: Payer: Self-pay | Admitting: Nurse Practitioner

## 2022-06-25 MED ORDER — OXYCODONE HCL 5 MG PO TABS: 5.0000 mg | ORAL_TABLET | ORAL | 0 refills | Status: DC | PRN

## 2022-07-08 MED FILL — Dexamethasone Sodium Phosphate Inj 100 MG/10ML: INTRAMUSCULAR | Qty: 1 | Status: AC

## 2022-07-09 ENCOUNTER — Inpatient Hospital Stay: Payer: BC Managed Care – PPO

## 2022-07-09 ENCOUNTER — Inpatient Hospital Stay (HOSPITAL_BASED_OUTPATIENT_CLINIC_OR_DEPARTMENT_OTHER): Payer: BC Managed Care – PPO | Admitting: Oncology

## 2022-07-09 ENCOUNTER — Inpatient Hospital Stay: Payer: BC Managed Care – PPO | Attending: Obstetrics and Gynecology

## 2022-07-09 ENCOUNTER — Encounter: Payer: Self-pay | Admitting: Oncology

## 2022-07-09 VITALS — BP 108/75 | HR 95 | Temp 96.8°F | Resp 18 | Ht 65.0 in | Wt 218.7 lb

## 2022-07-09 DIAGNOSIS — D509 Iron deficiency anemia, unspecified: Secondary | ICD-10-CM | POA: Insufficient documentation

## 2022-07-09 DIAGNOSIS — Z86711 Personal history of pulmonary embolism: Secondary | ICD-10-CM | POA: Diagnosis not present

## 2022-07-09 DIAGNOSIS — K760 Fatty (change of) liver, not elsewhere classified: Secondary | ICD-10-CM | POA: Insufficient documentation

## 2022-07-09 DIAGNOSIS — G620 Drug-induced polyneuropathy: Secondary | ICD-10-CM | POA: Diagnosis not present

## 2022-07-09 DIAGNOSIS — I10 Essential (primary) hypertension: Secondary | ICD-10-CM | POA: Insufficient documentation

## 2022-07-09 DIAGNOSIS — C541 Malignant neoplasm of endometrium: Secondary | ICD-10-CM

## 2022-07-09 DIAGNOSIS — Z809 Family history of malignant neoplasm, unspecified: Secondary | ICD-10-CM | POA: Diagnosis not present

## 2022-07-09 DIAGNOSIS — C7951 Secondary malignant neoplasm of bone: Secondary | ICD-10-CM | POA: Diagnosis not present

## 2022-07-09 DIAGNOSIS — R911 Solitary pulmonary nodule: Secondary | ICD-10-CM | POA: Insufficient documentation

## 2022-07-09 DIAGNOSIS — Z79899 Other long term (current) drug therapy: Secondary | ICD-10-CM | POA: Insufficient documentation

## 2022-07-09 DIAGNOSIS — Z5112 Encounter for antineoplastic immunotherapy: Secondary | ICD-10-CM

## 2022-07-09 DIAGNOSIS — E876 Hypokalemia: Secondary | ICD-10-CM | POA: Diagnosis not present

## 2022-07-09 DIAGNOSIS — Z7901 Long term (current) use of anticoagulants: Secondary | ICD-10-CM | POA: Insufficient documentation

## 2022-07-09 DIAGNOSIS — T451X5A Adverse effect of antineoplastic and immunosuppressive drugs, initial encounter: Secondary | ICD-10-CM | POA: Diagnosis not present

## 2022-07-09 LAB — COMPREHENSIVE METABOLIC PANEL
ALT: 8 U/L (ref 0–44)
AST: 20 U/L (ref 15–41)
Albumin: 3.4 g/dL — ABNORMAL LOW (ref 3.5–5.0)
Alkaline Phosphatase: 53 U/L (ref 38–126)
Anion gap: 8 (ref 5–15)
BUN: 12 mg/dL (ref 6–20)
CO2: 24 mmol/L (ref 22–32)
Calcium: 8.8 mg/dL — ABNORMAL LOW (ref 8.9–10.3)
Chloride: 104 mmol/L (ref 98–111)
Creatinine, Ser: 0.69 mg/dL (ref 0.44–1.00)
GFR, Estimated: >60 mL/min (ref >60)
Glucose, Bld: 100 mg/dL — ABNORMAL HIGH (ref 70–99)
Potassium: 3.4 mmol/L — ABNORMAL LOW (ref 3.5–5.1)
Sodium: 136 mmol/L (ref 135–145)
Total Bilirubin: 0.4 mg/dL (ref 0.3–1.2)
Total Protein: 7.2 g/dL (ref 6.5–8.1)

## 2022-07-09 LAB — CBC WITH DIFFERENTIAL/PLATELET
Abs Immature Granulocytes: 0.01 10*3/uL (ref 0.00–0.07)
Basophils Absolute: 0 10*3/uL (ref 0.0–0.1)
Basophils Relative: 1 %
Eosinophils Absolute: 0.2 10*3/uL (ref 0.0–0.5)
Eosinophils Relative: 5 %
HCT: 30.5 % — ABNORMAL LOW (ref 36.0–46.0)
Hemoglobin: 10.6 g/dL — ABNORMAL LOW (ref 12.0–15.0)
Immature Granulocytes: 0 %
Lymphocytes Relative: 36 %
Lymphs Abs: 1.6 10*3/uL (ref 0.7–4.0)
MCH: 28.6 pg (ref 26.0–34.0)
MCHC: 34.8 g/dL (ref 30.0–36.0)
MCV: 82.2 fL (ref 80.0–100.0)
Monocytes Absolute: 0.2 10*3/uL (ref 0.1–1.0)
Monocytes Relative: 5 %
Neutro Abs: 2.3 10*3/uL (ref 1.7–7.7)
Neutrophils Relative %: 53 %
Platelets: 213 10*3/uL (ref 150–400)
RBC: 3.71 MIL/uL — ABNORMAL LOW (ref 3.87–5.11)
RDW: 14.3 % (ref 11.5–15.5)
WBC: 4.4 10*3/uL (ref 4.0–10.5)
nRBC: 0 % (ref 0.0–0.2)

## 2022-07-09 MED ORDER — SODIUM CHLORIDE 0.9 % IV SOLN
10.0000 mg | Freq: Once | INTRAVENOUS | Status: AC
  Administered 2022-07-09: 10 mg via INTRAVENOUS
  Filled 2022-07-09: qty 10

## 2022-07-09 MED ORDER — SODIUM CHLORIDE 0.9 % IV SOLN
200.0000 mg | Freq: Once | INTRAVENOUS | Status: AC
  Administered 2022-07-09: 200 mg via INTRAVENOUS
  Filled 2022-07-09: qty 200

## 2022-07-09 MED ORDER — HEPARIN SOD (PORK) LOCK FLUSH 100 UNIT/ML IV SOLN
500.0000 [IU] | Freq: Once | INTRAVENOUS | Status: AC | PRN
  Administered 2022-07-09: 500 [IU]
  Filled 2022-07-09: qty 5

## 2022-07-09 MED ORDER — SODIUM CHLORIDE 0.9 % IV SOLN
Freq: Once | INTRAVENOUS | Status: AC
  Filled 2022-07-09: qty 250

## 2022-07-09 NOTE — Progress Notes (Signed)
Hematology/Oncology Consult note [facility]  [phone] [phone]  Patient Care Team: Pcp, No as PCP - General [name], RN as Oncology Nurse Navigator [name], MD as Medical Oncologist (Oncology)   Name of the patient: Jean Morris  [id number]  [birth year]   Date of visit: 07/09/22  Diagnosis- stage IVb endometrioid endometrial cancer       Chief complaint/ Reason for visit-on treatment assessment prior to cycle 3 of palliative Keytruda  Heme/Onc history: Patient is a 47 year old female who has seen Dr. Logan Bores for irregular menstrual bleeding.  He has had an IUD in place and initially it was attribute it to use of IUD and was tried to be controlled with progesterone agents.  However due to worsening abdominal pain patient underwent a CT abdomen and pelvis with contrast on 12/25/2021 which showed 8 mm   Posterior lung base mass.  Retroperitoneal adenopathy measuring 2.6 cm additional retrocrural adenopathy.  Faint lucent lesion involving L2 vertebral body.  The uterus is anteverted and enlarged.  Endometrium is enlarged heterogeneous and irregular extending to the fundal myometrium.  Findings consistent with endometrial malignancy.  CT chest without contrast showed a 10 mm right lower lobe subpleural nodule.   Patient had endometrial biopsy which was consistent FIGO grade 2 endometrioid endometrial carcinoma.  Patient was seen by GYN oncology and hopes to take her for surgery if she has good response to neoadjuvant chemotherapy.MSI unstable.  BRAF negative.  Loss of major and minor MMR proteins MLH1 and PMS2 less than 5% of tumor expression.  MLH1 hyper methylation present.  This is most likely due to somatic epigenetic modification which can be seen in about 77% of sporadic endometrial cancers.   Patient completed 6 cycles of CarboTaxol Keytruda chemotherapy and is currently on maintenance Keytruda.  PET CT scan on 05/19/2022  showed enlarged uterus with hypermetabolic endometrial thickening.  Improving left periaortic nodal metastases.  No definitive bone metastases was noted.  Plan is to proceed with cytoreductive surgery at [facility].  Patient underwent hysterectomy and bilateral salpingo-oophorectomy on 06/10/2022.  Final pathology showed residual endometrioid adenocarcinoma of the endometrium FIGO grade 2 with marked treatment effect invading 18 mm and a 34 mm thick myometrium.  Fallopian tubes and ovaries were negative for malignancy.  Pelvic washings were negative for malignancy.    Interval history-patient still has some pelvic pressure at the site of recent surgery.  Overall it is getting better.  Reports mild neuropathy in her hands and left foot which is overall stable.  ECOG PS- 1 Pain scale- 0   Review of systems- Review of Systems  Constitutional:  Positive for malaise/fatigue.  Neurological:  Positive for sensory change (Peripheral neuropathy).      Allergies  Allergen Reactions   Bee Pollen Nausea And Vomiting    Itchy, swelling, watery eyes, runny nose.   Pollen Extract Nausea And Vomiting    Itchy, swelling, watery eyes, runny nose.     Past Medical History:  Diagnosis Date   Anemia    Endometrial cancer (HCC)    Hypertension    Menorrhagia    Pulmonary embolism [facility])      Past Surgical History:  Procedure Laterality Date   CESAREAN SECTION  1994   COLONOSCOPY WITH PROPOFOL N/A 08/06/2021   Procedure: COLONOSCOPY WITH PROPOFOL;  Surgeon: Wyline Mood, MD;  Location: [facility] ENDOSCOPY;  Service: Gastroenterology;  Laterality: N/A;   ESOPHAGOGASTRODUODENOSCOPY (EGD) WITH PROPOFOL N/A 02/09/2020   Procedure: ESOPHAGOGASTRODUODENOSCOPY (  EGD) WITH PROPOFOL;  Surgeon: Wyline Mood, MD;  Location: [facility] ENDOSCOPY;  Service: Gastroenterology;  Laterality: N/A;   IR IMAGING GUIDED PORT INSERTION  01/17/2022    Social History   Socioeconomic History   Marital status: Media planner    Spouse  name: Not on file   Number of children: Not on file   Years of education: Not on file   Highest education level: Not on file  Occupational History   Not on file  Tobacco Use   Smoking status: Never   Smokeless tobacco: Never  Vaping Use   Vaping Use: Never used  Substance and Sexual Activity   Alcohol use: No   Drug use: No   Sexual activity: Not Currently    Birth control/protection: I.U.D.    Comment: patient states MD said did not see in CT today 01/17/2022  Other Topics Concern   Not on file  Social History Narrative   Lives with [facility], partner, and no pets.   Social Determinants of Health   Financial Resource Strain: Not on file  Food Insecurity: Not on file  Transportation Needs: Not on file  Physical Activity: Not on file  Stress: Not on file  Social Connections: Not on file  Intimate Partner Violence: Not on file    Family History  Problem Relation Age of Onset   Hypertension Mother    Stroke Mother    Heart attack Mother    Cancer Father    Hypertension Father    Cancer Sister    Cancer Sister    Cancer Brother    Hypertension Other      Current Outpatient Medications:    apixaban (ELIQUIS) 2.5 MG TABS tablet, Take 1 tablet (2.5 mg total) by mouth 2 (two) times daily., Disp: 60 tablet, Rfl: 3   benzonatate (TESSALON) 100 MG capsule, Take 1 capsule (100 mg total) by mouth 3 (three) times daily as needed for cough., Disp: 20 capsule, Rfl: 0   dexamethasone (DECADRON) 4 MG tablet, Take 2 tablets by mouth starting the day after chemotherapy for 3 days. Take with food., Disp: 30 tablet, Rfl: 1   fluconazole (DIFLUCAN) 150 MG tablet, Take 1 tablet (150 mg total) by mouth daily. Take 1 tablet (150 mg) by mouth. Three days later, take second tablet., Disp: 2 tablet, Rfl: 0   lidocaine-prilocaine (EMLA) cream, Apply to port site as directed, Disp: 30 g, Rfl: 3   LORazepam (ATIVAN) 0.5 MG tablet, Take 1 tablet (0.5 mg total) by mouth every 8 (eight) hours., Disp:  30 tablet, Rfl: 0   magnesium hydroxide (MILK OF MAGNESIA) 400 MG/5ML suspension, Take 15 mLs by mouth daily as needed for moderate constipation. Take if no bowel movement for 2 days., Disp: , Rfl:    methocarbamol (ROBAXIN) 500 MG tablet, Take 1 tablet (500 mg total) by mouth 2 (two) times daily as needed for muscle spasms., Disp: 30 tablet, Rfl: 0   nystatin (MYCOSTATIN/NYSTOP) powder, Apply 1 Application topically 3 (three) times daily., Disp: 30 g, Rfl: 3   oxyCODONE (OXY IR/ROXICODONE) 5 MG immediate release tablet, Take 1 tablet (5 mg total) by mouth every 4 (four) hours as needed for severe pain., Disp: 120 tablet, Rfl: 0   pantoprazole (PROTONIX) 40 MG tablet, Take 1 tablet (40 mg total) by mouth daily., Disp: 30 tablet, Rfl: 2   polyethylene glycol (MIRALAX) 17 g packet, Take 17 g by mouth daily. To prevent constipation, Disp: 30 each, Rfl: 3   potassium chloride SA (  KLOR-CON M) 20 MEQ tablet, Take 1 tablet (20 mEq total) by mouth 2 (two) times daily. Dissolve tablets, Disp: 60 tablet, Rfl: 2   senna (SENOKOT) 8.6 MG TABS tablet, Take 1-2 tablets (8.6-17.2 mg total) by mouth daily. To prevent constipation, Disp: 120 tablet, Rfl: 0   levonorgestrel (MIRENA) 20 MCG/24HR IUD, 1 Intra Uterine Device (1 each total) by Intrauterine route once. (Patient not taking: Reported on 06/18/2022), Disp: 1 each, Rfl: 0   prochlorperazine (COMPAZINE) 10 MG tablet, Take 1 tablet (10 mg total) by mouth every 6 (six) hours as needed for nausea or vomiting. (Patient not taking: Reported on 06/18/2022), Disp: 30 tablet, Rfl: 1  Physical exam:  Vitals:   07/09/22 0917  BP: 108/75  Pulse: 95  Resp: 18  Temp: (!) 96.8 F (36 C)  TempSrc: Tympanic  SpO2: 100%  Weight: 218 lb 11.2 oz (99.2 kg)  Height: 5\' 5"  (1.651 m)   Physical Exam Cardiovascular:     Rate and Rhythm: Normal rate and regular rhythm.     Heart sounds: Normal heart sounds.  Pulmonary:     Effort: Pulmonary effort is normal.     Breath  sounds: Normal breath sounds.  Abdominal:     General: Bowel sounds are normal.     Palpations: Abdomen is soft.  Skin:    General: Skin is warm and dry.  Neurological:     Mental Status: She is alert and oriented to person, place, and time.         Latest Ref Rng & Units 07/09/2022    9:07 AM  CMP  Glucose 70 - 99 mg/dL 829   BUN 6 - 20 mg/dL 12   Creatinine 5.62 - 1.00 mg/dL 1.30   Sodium 865 - 784 mmol/L 136   Potassium 3.5 - 5.1 mmol/L 3.4   Chloride 98 - 111 mmol/L 104   CO2 22 - 32 mmol/L 24   Calcium 8.9 - 10.3 mg/dL 8.8   Total Protein 6.5 - 8.1 g/dL 7.2   Total Bilirubin 0.3 - 1.2 mg/dL 0.4   Alkaline Phos 38 - 126 U/L 53   AST 15 - 41 U/L 20   ALT 0 - 44 U/L 8       Latest Ref Rng & Units 07/09/2022    9:07 AM  CBC  WBC 4.0 - 10.5 K/uL 4.4   Hemoglobin 12.0 - 15.0 g/dL 69.6   Hematocrit 29.5 - 46.0 % 30.5   Platelets 150 - 400 K/uL 213      Assessment and plan- Patient is a 47 y.o. female  with stage IV endometrioid endometrial carcinoma T2 N2 M1.  She is here for on treatment assessment prior to cycle 3 of palliative Keytruda  Counts okay to proceed with cycle 3 of palliative Keytruda today.  I will see her back in 3 weeks for cycle 4.  Plan to repeat scans after 5-6 cycles.  Overall tolerating treatments well without any significant side effects.  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  Patient does not wish to start on any medications at this time.  Patient was previously on Eliquis 2.5 mg twice daily for DVT prophylaxis.  Her tumor burden is currently low and she is postsurgery as well.  I will stop her Eliquis at this time   Visit Diagnosis 1. Endometrial cancer (HCC)   2. Encounter for antineoplastic immunotherapy      Dr. Owens Shark, MD, MPH [facility] [phone]  07/09/2022 12:17 PM

## 2022-07-09 NOTE — Patient Instructions (Signed)

## 2022-07-09 NOTE — Patient Instructions (Signed)
[facility]  Discharge Instructions: Thank you for choosing [facility] to provide your oncology and hematology care.  If you have a lab appointment with the Cancer Center, please go directly to the Cancer Center and check in at the registration area.  Wear comfortable clothing and clothing appropriate for easy access to any Portacath or PICC line.   We strive to give you quality time with your provider. You may need to reschedule your appointment if you arrive late (15 or more minutes).  Arriving late affects you and other patients whose appointments are after yours.  Also, if you miss three or more appointments without notifying the office, you may be dismissed from the clinic at the provider's discretion.      For prescription refill requests, have your pharmacy contact our office and allow 72 hours for refills to be completed.    Today you received the following chemotherapy and/or immunotherapy agents- Keytruda      To help prevent nausea and vomiting after your treatment, we encourage you to take your nausea medication as directed.  BELOW ARE SYMPTOMS THAT SHOULD BE REPORTED IMMEDIATELY: *FEVER GREATER THAN 100.4 F (38 C) OR HIGHER *CHILLS OR SWEATING *NAUSEA AND VOMITING THAT IS NOT CONTROLLED WITH YOUR NAUSEA MEDICATION *UNUSUAL SHORTNESS OF BREATH *UNUSUAL BRUISING OR BLEEDING *URINARY PROBLEMS (pain or burning when urinating, or frequent urination) *BOWEL PROBLEMS (unusual diarrhea, constipation, pain near the anus) TENDERNESS IN MOUTH AND THROAT WITH OR WITHOUT PRESENCE OF ULCERS (sore throat, sores in mouth, or a toothache) UNUSUAL RASH, SWELLING OR PAIN  UNUSUAL VAGINAL DISCHARGE OR ITCHING   Items with * indicate a potential emergency and should be followed up as soon as possible or go to the Emergency Department if any problems should occur.  Please show the CHEMOTHERAPY ALERT CARD or IMMUNOTHERAPY ALERT CARD at check-in to  the Emergency Department and triage nurse.  Should you have questions after your visit or need to cancel or reschedule your appointment, please contact [facility]  [phone] and follow the prompts.  Office hours are 8:00 a.m. to 4:30 p.m. Monday - Friday. Please note that voicemails left after 4:00 p.m. may not be returned until the following business day.  We are closed weekends and major holidays. You have access to a nurse at all times for urgent questions. Please call the main number to the clinic [phone] and follow the prompts.  For any non-urgent questions, you may also contact your provider using MyChart. We now offer e-Visits for anyone 18 and older to request care online for non-urgent symptoms. For details visit [email or web address].   Also download the MyChart app! Go to the app store, search "MyChart", open the app, select [facility], and log in with your MyChart username and password.   

## 2022-07-10 ENCOUNTER — Other Ambulatory Visit: Payer: Self-pay

## 2022-07-11 ENCOUNTER — Other Ambulatory Visit: Payer: Self-pay

## 2022-07-16 ENCOUNTER — Inpatient Hospital Stay (HOSPITAL_BASED_OUTPATIENT_CLINIC_OR_DEPARTMENT_OTHER): Payer: BC Managed Care – PPO | Admitting: Obstetrics and Gynecology

## 2022-07-16 ENCOUNTER — Encounter: Payer: Self-pay | Admitting: Obstetrics and Gynecology

## 2022-07-16 VITALS — BP 119/83 | HR 93 | Temp 96.6°F | Resp 20 | Wt 224.7 lb

## 2022-07-16 DIAGNOSIS — C541 Malignant neoplasm of endometrium: Secondary | ICD-10-CM

## 2022-07-16 NOTE — Progress Notes (Signed)
Gynecologic Oncology Interval Visit   Referring Provider: Dr. Logan Bores & Dr Cathie Hoops  Chief Complaint: Endometrial adenocarcinoma, stage IVB, post op  Subjective:  Jean Morris is a 47 y.o. female who is seen in consultation from Dr. Logan Bores for irregular bleeding for many years, diagnosed with Stage IVb endometrioid endometrial cancer currently s/p 6 cycles of neoadjuvant carboplatin with paclitaxel and keytruda with single agent keytruda for cycle 7, now s/p TLH BSO at [facility] with Dr Johnnette Litter on 06/10/22 who presents to clinic for wound check and vaginal irritation.   Here for post op check.  She has started back with Dr Smith Robert today for [facility].  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  Some abdominal tightness.   Gynecologic Oncology History:  Patient presented to her OB/GYN for irregular bleeding for several months.  She was thought to have an IUD in place and her bleeding was attempted to be controlled with progesterone agents.  Medical history significant for PE in [facility] in 02/2015  12/25/21- CT C/A/P-  for left lower quadrant abdominal pain showed enlarged uterus with thickened endometrium, heterogeneous and irregular extending to the fundal myometrium.  Retroperitoneal and retrocrural adenopathy.  Faint lucent lesion in left L2 vertebrae suspicious for metastasis.  Posterior right lower lobe subpleural nodule measuring 10 mm with surrounding groundglass opacity; suspicious for metastasis though was present on 08/2015 CT but measured 5 mm.   Pelvic ultrasound- Uterus-19 x 13.3 x 15 cm = volume 1974 mls.  Distinct endometrium not visible.  Heterogeneous area which may be surrounded by myometrium may be as much is 7 cm in greatest thickness. Endometrium-thickness, perhaps cystic is 75 mm.  Markedly heterogeneous with signs of internal flow. Right ovary-not visualized Left ovary-5.8 x 3.1 x 4.2 cm.  Simple dominant follicle measuring up to 3.5 cm accounts for much of the left ovarian dimension.  Pulse Doppler  evaluation shows low resistance arterial and venous flow within the left ovary Other-small free fluid in the pelvis.  Endometrial biopsy- -endometrioid adenocarcinoma, FIGO grade 2  She was started on norethindrone by Dr. Logan Bores.   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 She was taking oral contraceptives at that time with increased dosing d/t heavy menstrual periods. Additionally, hx of iron deficiency and has taken oral iron. Given extent of disease and VTE neoaduvant therapy was recommended.    Treatment Summary:  01/21/22- D1C1- carboplatin 02/12/22- D1C2 carbo-paclitaxel-pembrolizumab 03/05/22- D1C3 carbo-paclitaxel-pembrolizumab   03/17/22- CT Chest Abdomen Pelvis W Contrast IMPRESSION: 1. Persistent heterogeneous irregularity of the endometrium with uterine enlargement compatible with known endometrial malignancy.  2. New heterogeneity of the 2.8 cm left ovarian lesion, nonspecific but possibly reflecting metastatic disease. Consider further evaluation with pelvic ultrasound. 3. Overall decreased retrocrural and retroperitoneal adenopathy. However, there is interval increase in size and adjacent infiltrative/desmoplastic stranding adjacent to a centrally necrotic left periaortic lymph node/lymph node conglomerate. 4. Continued decrease in size of the posterior right lower lobe pulmonary nodule. 5. New pathologic compression deformity of the L2 vertebral body. 6. Diffuse hepatic steatosis. 7. Mild diffuse bronchial wall thickening with mosaic attenuation of the lungs, suggestive of small airways disease.  01/21/22 D1C1 Carbo-paclitaxel-keytruda 02/12/22 D1C2 Carbo-paclitaxel-keytruda 03/05/22 D1C3 Carbo-paclitaxel-keytruda 03/26/22 D1C4 Carbo-paclitaxel-keytruda 04/16/22 D1C5 Carbo-paclitaxel-keytruda 05/07/2022-  D1C6 carbo-paclitaxel-pembrolizumab. 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 Zometa 4 mg added.  05/28/22-  D1C7 -  pembrolizumab 05/19/2022 PET - enlarged uterus is hypermetabolic endometrial thickening consistent with known endometrial cancer. Improving left periaortic nodal metastases. Addtiional small cervical,  axillary, and inguinal nodes reflective of nodal metastases.    06/10/22- TLH BSO at [facility] with Dr. Johnnette Litter.  Pathology: A.  Uterus, cervix, bilateral fallopian tubes and ovaries, hysterectomy, bilateral salpingo-oophorectomy: Residual endometrioid adenocarcinoma of the endometrium (0.6 cm), FIGO grade 2 of 3, with marked treatment effect, invading 18 mm in a 34 mm thick myometrium. 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 Cervix: Negative for malignancy. Serosa: Negative malignancy. Right and left ovaries: Negative for malignancy. Right and left fallopian tubes: Negative malignancy. Microsatellite instability and mismatch repair protein immunohistochemistry: Not performed (obtained on prior specimen). P53 immunohistochemistry: Not performed  A: Pelvic Washings: negative. No evidence of malignancy.   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  BRAF V600E not present. The tumor cells are NEGATIVE for Her2 (1+). Estrogen Receptor:       POSITIVE, 90%, MODERATE STAINING INTENSITY Progesterone Receptor:   POSITIVE, 80%, STRONG STAINING INTENSITY   Problem List: Patient Active Problem List   Diagnosis Date Noted   Hypokalemia 04/01/2022   Endometrial cancer (HCC) 01/15/2022   Iron deficiency anemia 01/15/2022   Morbid obesity with BMI of 60.0-69.9, adult (HCC) 04/18/2015   Abnormal uterine bleeding 04/18/2015   Anemia 04/18/2015   Pulmonary embolism (HCC) 03/11/2015    Past Medical History: Past Medical History:  Diagnosis Date   Anemia    Endometrial cancer (HCC)    Hypertension    Menorrhagia    Pulmonary embolism (HCC)     Past Surgical History: Past Surgical History:  Procedure Laterality Date   CESAREAN SECTION  1994    COLONOSCOPY WITH PROPOFOL N/A 08/06/2021   Procedure: COLONOSCOPY WITH PROPOFOL;  Surgeon: Wyline Mood, MD;  Location: [facility] ENDOSCOPY;  Service: Gastroenterology;  Laterality: N/A;   ESOPHAGOGASTRODUODENOSCOPY (EGD) WITH PROPOFOL N/A 02/09/2020   Procedure: ESOPHAGOGASTRODUODENOSCOPY (EGD) WITH PROPOFOL;  Surgeon: Wyline Mood, MD;  Location: [facility] ENDOSCOPY;  Service: Gastroenterology;  Laterality: N/A;   IR IMAGING GUIDED PORT INSERTION  01/17/2022    Past Gynecologic History:  Menarche: age 17, last 5 days History of OCP/HRT use: yes Last pap:  03/14/21- ASCUS, HPV negative History of STDs: The patient denies history of sexually transmitted disease. Sexually active: yes; not currently  OB History:  OB History  Gravida Para Term Preterm AB Living  3 3 3     3   SAB IAB Ectopic Multiple Live Births          3    # Outcome Date GA Lbr Len/2nd Weight Sex Delivery Anes PTL Lv  3 Term     M CS-LTranv   LIV  2 Term     M Vag-Spont   LIV  1 Term     F Vag-Spont   LIV   Family History: Family History  Problem Relation Age of Onset   Hypertension Mother    Stroke Mother    Heart attack Mother    Cancer Father    Hypertension Father    Cancer Sister    Cancer Sister    Cancer Brother    Hypertension Other    Social History: Social History   Socioeconomic History   Marital status: Media planner    Spouse name: Not on file   Number of children: Not on file   Years of education: Not on file   Highest education level: Not on file  Occupational History   Not on file  Tobacco Use   Smoking status: Never   Smokeless tobacco: Never  Vaping Use  Vaping Use: Never used  Substance and Sexual Activity   Alcohol use: No   Drug use: No   Sexual activity: Not Currently    Birth control/protection: I.U.D.    Comment: patient states MD said did not see in CT today 01/17/2022  Other Topics Concern   Not on file  Social History Narrative   Lives with [facility], partner, and no pets.    Social Determinants of Health   Financial Resource Strain: Not on file  Food Insecurity: Not on file  Transportation Needs: Not on file  Physical Activity: Not on file  Stress: Not on file  Social Connections: Not on file  Intimate Partner Violence: Not on file   Allergies: Allergies  Allergen Reactions   Bee Pollen Nausea And Vomiting    Itchy, swelling, watery eyes, runny nose.   Pollen Extract Nausea And Vomiting    Itchy, swelling, watery eyes, runny nose.   Current Medications: Current Outpatient Medications  Medication Sig Dispense Refill   benzonatate (TESSALON) 100 MG capsule Take 1 capsule (100 mg total) by mouth 3 (three) times daily as needed for cough. 20 capsule 0   dexamethasone (DECADRON) 4 MG tablet Take 2 tablets by mouth starting the day after chemotherapy for 3 days. Take with food. 30 tablet 1   fluconazole (DIFLUCAN) 150 MG tablet Take 1 tablet (150 mg total) by mouth daily. Take 1 tablet (150 mg) by mouth. Three days later, take second tablet. 2 tablet 0   levonorgestrel (MIRENA) 20 MCG/24HR IUD 1 Intra Uterine Device (1 each total) by Intrauterine route once. (Patient not taking: Reported on 06/18/2022) 1 each 0   lidocaine-prilocaine (EMLA) cream Apply to port site as directed 30 g 3   LORazepam (ATIVAN) 0.5 MG tablet Take 1 tablet (0.5 mg total) by mouth every 8 (eight) hours. 30 tablet 0   magnesium hydroxide (MILK OF MAGNESIA) 400 MG/5ML suspension Take 15 mLs by mouth daily as needed for moderate constipation. Take if no bowel movement for 2 days.     methocarbamol (ROBAXIN) 500 MG tablet Take 1 tablet (500 mg total) by mouth 2 (two) times daily as needed for muscle spasms. 30 tablet 0   nystatin (MYCOSTATIN/NYSTOP) powder Apply 1 Application topically 3 (three) times daily. 30 g 3   oxyCODONE (OXY IR/ROXICODONE) 5 MG immediate release tablet Take 1 tablet (5 mg total) by mouth every 4 (four) hours as needed for severe pain. 120 tablet 0   pantoprazole  (PROTONIX) 40 MG tablet Take 1 tablet (40 mg total) by mouth daily. 30 tablet 2   polyethylene glycol (MIRALAX) 17 g packet Take 17 g by mouth daily. To prevent constipation 30 each 3   potassium chloride SA (KLOR-CON M) 20 MEQ tablet Take 1 tablet (20 mEq total) by mouth 2 (two) times daily. Dissolve tablets 60 tablet 2   prochlorperazine (COMPAZINE) 10 MG tablet Take 1 tablet (10 mg total) by mouth every 6 (six) hours as needed for nausea or vomiting. (Patient not taking: Reported on 06/18/2022) 30 tablet 1   senna (SENOKOT) 8.6 MG TABS tablet Take 1-2 tablets (8.6-17.2 mg total) by mouth daily. To prevent constipation 120 tablet 0   No current facility-administered medications for this visit.    Review of Systems General: fatigue  HEENT: no complaints  Lungs: shortness of breath/cough  Cardiac: no complaints  UJ:[id number] oain from incisions  GU: vulvar irritation and itching   Musculoskeletal: back pain  Extremities: no complaints  Skin: no complaints  Neuro: no complaints  Endocrine: no complaints  Psych: no complaints       Objective:  Physical Examination:  Vitals:   07/16/22 0943  BP: 119/83  Pulse: 93  Resp: 20  Temp: (!) 96.6 F (35.9 C)  SpO2: 100%   Body mass index is 37.39 kg/m.  ECOG Performance Status: 2 - Symptomatic, <50% confined to bed  GENERAL: Patient is a well appearing female in no acute distress; she walked into clinic which is an improvement HEENT:  PERRL, neck supple with midline trachea.   LUNGS:  Normal respiratory effort ABDOMEN:  Soft, nontender. Nondistended.  Incisions well healed.  Slight yeast infection with redness under panus.  EXTREMITIES:  No peripheral edema.   SKIN:  Clear with no obvious rashes. Incisions all well healed NEURO:  Nonfocal. Well oriented.  Appropriate affect.  Pelvic: EGBUS: normal Vagina: cuff well healed.  Bimanual: normal  Labs Lab Results  Component Value Date   WBC 4.4 07/09/2022   HGB 10.6 (L)  07/09/2022   HCT 30.5 (L) 07/09/2022   MCV 82.2 07/09/2022   PLT 213 07/09/2022     Chemistry      Component Value Date/Time   NA 136 07/09/2022 0907   NA 139 10/23/2021 0940   NA 136 06/15/2013 1946   K 3.4 (L) 07/09/2022 0907   K 3.4 (L) 06/15/2013 1946   CL 104 07/09/2022 0907   CL 104 06/15/2013 1946   CO2 24 07/09/2022 0907   CO2 25 06/15/2013 1946   BUN 12 07/09/2022 0907   BUN 7 10/23/2021 0940   BUN 7 06/15/2013 1946   CREATININE 0.69 07/09/2022 0907   CREATININE 0.62 04/21/2022 1311   CREATININE 0.94 06/15/2013 1946      Component Value Date/Time   CALCIUM 8.8 (L) 07/09/2022 0907   CALCIUM 8.8 06/15/2013 1946   ALKPHOS 53 07/09/2022 0907   ALKPHOS 81 04/08/2013 1258   AST 20 07/09/2022 0907   AST 39 04/21/2022 1311   ALT 8 07/09/2022 0907   ALT 28 04/21/2022 1311   ALT 16 04/08/2013 1258   BILITOT 0.4 07/09/2022 0907   BILITOT 1.1 04/21/2022 1311     Lab Results  Component Value Date   IRON 59 04/16/2022   TIBC 137 (L) 04/16/2022   FERRITIN 287 04/16/2022   Lab Results  Component Value Date   VITAMINB12 875 04/16/2022   Lab Results  Component Value Date   FOLATE 10.7 01/15/2022   05/07/2022 Albumin 3.0    IMAGING:   PET scan 05/19/2022 05/19/2022 PET    COMPARISON:  CT chest abdomen pelvis dated 03/17/2022.   FINDINGS: Mediastinal blood pool activity: SUV max 2.8   Liver activity: SUV max NA   NECK: Small bilateral level 2 cervical nodes measuring up to 6 mm short axis (series 4/image 20), max SUV 3.8 on the left.   Incidental CT findings: None.   CHEST: Small right axillary nodes measuring up to 6 mm short axis inferiorly (series 4/image 53), max SUV 3.0.   Small left axillary/intramammary nodes measuring up to 8 mm short axis inferiorly (series 4/image 58), max SUV 4.9.   No hypermetabolic mediastinal lymphadenopathy.   No hypermetabolic pulmonary nodules.   Right chest port terminates at the cavoatrial junction.   Incidental  CT findings: None.   ABDOMEN/PELVIS: Abnormal soft tissue in the left periaortic region measuring 1.6 x 2.9 cm (series 4/image 99), max SUV 3.1. Adjacent discrete 9 mm short axis left para-aortic node (series  4/image 99), max SUV 5.8. When compared to the prior, the dominant left periaortic nodal soft tissue has improved.   Small bilateral inferior inguinal nodes measuring up to 6 mm short axis on the left (series 4/image 153), max SUV 2.2.   Enlarged uterus with endometrial thickening, corresponding to the patient's known endometrial cancer. Associated central endometrial hypermetabolism, max SUV 4.7.   Incidental CT findings: None.   SKELETON: No focal hypermetabolic activity to suggest skeletal metastasis.   Incidental CT findings: Degenerative changes of the lumbar spine with known L2 compression fracture deformity.   IMPRESSION: Enlarged uterus with hypermetabolic endometrial thickening, corresponding to the patient's known endometrial cancer.   Improving left periaortic nodal metastases, as above.   Additional small cervical, axillary, and inguinal nodes likely reflect nodal metastases, although low-grade lymphoma is also possible.  Assessment:  Jean Morris is a 47 y.o. female with PMB, anemia and large uterus diagnosed with figo grade 2 endometrial cancer on endometrial biopsy 11/23.  Stage IVB based on suspicious lung lesion, L2 vertebral lesion, significantly enlarged left aortic adenopathy consistent with metastatic disease on imaging. Improved disease control s/p 6 cycles of chemotherapy with carbo/taxol and [place].  PET/CT 2/34 showed enlarged uterus with hypermetabolic endometrial thickening and no other PET avid lesions.  In view of this, underwent TLH BSO 06/06/22 for disease control with excellent recovery.  Pathology showed grade 2 endometrioid cancer with 50% invasion, cervix, adnexa and washings negative.    Normal post op exam.   Molecular Pathology: MSI-H,  Loss of MLH1/PMS2 with MLH1 promoter methylation.  BRAF V600E not present. The tumor cells are NEGATIVE for Her2 (1+). Estrogen Receptor:       POSITIVE, 90%, MODERATE STAINING INTENSITY Progesterone Receptor:   POSITIVE, 80%, STRONG STAINING INTENSITY  Anemia, with prior normal iron, B12, and folate with low decreased TIBC and Tsats.   Postop vulvar yeast infection treated, now with some under panus and will use nystatin powder.   Hypokalemia, asymptomatic  Prior abnormal EKG 02/2022  Body mass index is 37.39 kg/m.  History of PE currently on Eliquis  Medical co-morbidities complicating care: HTN, There is no height or weight on file to calculate BMI., prior surgery, PE while taking OCs. Plan:   Problem List Items Addressed This Visit       Genitourinary   Endometrial cancer (HCC) - Primary   Recommend continuing pembrolizumab maintenance with Dr Smith Robert her Medical Oncologist.    Dr Smith Robert and I discussed possible role of radiation therapy for persistent PET positive left PA nodal area.  She will repeat PET scan next month and if there is still residual disease there refer her for local radiation.  Would not necessarily radiate entire pelvic and aortic nodal area since she originally had adenopathy above that and this has responded to systemic therapy.    If she develops progressive disease it would be an option to continue Pembrolizumab and add Lenvatanib.  In view of the endometrioid histology and positive ER/PR expression she could also be a candidate for a hormonal regimen such as alternating Megace and Tamoxifen or Everolimus/Letrozole.    She enrolled on the [employer] consortium study.   The patient's diagnosis, an outline of the further diagnostic and laboratory studies which will be required, the recommendation for surgery, and alternatives were discussed with her and her accompanying family members.  All questions were answered to their satisfaction.  I personally interviewed  and examined the patient. Agreed with the above/below plan of care.  I have directly contributed to assessment and plan of care of this patient and educated and discussed with patient and family. Consuello Masse, NP scribed the note.   Alinda Dooms, NP  I personally interviewed and examined the patient. Agreed with the above/below plan of care. I have directly contributed to assessment and plan of care of this patient and educated and discussed with patient and family.  Leida Lauth, MD

## 2022-07-18 ENCOUNTER — Other Ambulatory Visit: Payer: Self-pay | Admitting: Obstetrics and Gynecology

## 2022-07-28 ENCOUNTER — Telehealth: Payer: Self-pay

## 2022-07-28 DIAGNOSIS — C541 Malignant neoplasm of endometrium: Secondary | ICD-10-CM

## 2022-07-28 MED ORDER — LIDOCAINE-PRILOCAINE 2.5-2.5 % EX CREA
TOPICAL_CREAM | CUTANEOUS | 3 refills | Status: DC
Start: 2022-07-28 — End: 2022-12-16

## 2022-07-28 NOTE — Telephone Encounter (Signed)
Just sent to pharmacy. 

## 2022-07-29 MED FILL — Dexamethasone Sodium Phosphate Inj 100 MG/10ML: INTRAMUSCULAR | Qty: 1 | Status: AC

## 2022-07-30 ENCOUNTER — Inpatient Hospital Stay: Payer: BC Managed Care – PPO

## 2022-07-30 ENCOUNTER — Inpatient Hospital Stay (HOSPITAL_BASED_OUTPATIENT_CLINIC_OR_DEPARTMENT_OTHER): Payer: BC Managed Care – PPO | Admitting: Oncology

## 2022-07-30 ENCOUNTER — Inpatient Hospital Stay: Payer: BC Managed Care – PPO | Attending: Obstetrics and Gynecology

## 2022-07-30 ENCOUNTER — Encounter: Payer: Self-pay | Admitting: Oncology

## 2022-07-30 VITALS — BP 122/76 | HR 77 | Temp 96.8°F | Resp 20

## 2022-07-30 VITALS — BP 117/82 | HR 94 | Temp 97.1°F | Resp 18 | Ht 65.0 in | Wt 231.2 lb

## 2022-07-30 DIAGNOSIS — R1030 Lower abdominal pain, unspecified: Secondary | ICD-10-CM | POA: Insufficient documentation

## 2022-07-30 DIAGNOSIS — R918 Other nonspecific abnormal finding of lung field: Secondary | ICD-10-CM | POA: Diagnosis not present

## 2022-07-30 DIAGNOSIS — Z5112 Encounter for antineoplastic immunotherapy: Secondary | ICD-10-CM

## 2022-07-30 DIAGNOSIS — Z86711 Personal history of pulmonary embolism: Secondary | ICD-10-CM | POA: Diagnosis not present

## 2022-07-30 DIAGNOSIS — Z809 Family history of malignant neoplasm, unspecified: Secondary | ICD-10-CM | POA: Insufficient documentation

## 2022-07-30 DIAGNOSIS — D649 Anemia, unspecified: Secondary | ICD-10-CM | POA: Insufficient documentation

## 2022-07-30 DIAGNOSIS — Z90722 Acquired absence of ovaries, bilateral: Secondary | ICD-10-CM | POA: Insufficient documentation

## 2022-07-30 DIAGNOSIS — C541 Malignant neoplasm of endometrium: Secondary | ICD-10-CM | POA: Diagnosis not present

## 2022-07-30 DIAGNOSIS — Z79899 Other long term (current) drug therapy: Secondary | ICD-10-CM | POA: Insufficient documentation

## 2022-07-30 DIAGNOSIS — I10 Essential (primary) hypertension: Secondary | ICD-10-CM | POA: Diagnosis not present

## 2022-07-30 DIAGNOSIS — C772 Secondary and unspecified malignant neoplasm of intra-abdominal lymph nodes: Secondary | ICD-10-CM | POA: Diagnosis not present

## 2022-07-30 LAB — CBC WITH DIFFERENTIAL/PLATELET
Abs Immature Granulocytes: 0.01 10*3/uL (ref 0.00–0.07)
Basophils Absolute: 0 10*3/uL (ref 0.0–0.1)
Basophils Relative: 1 %
Eosinophils Absolute: 0.2 10*3/uL (ref 0.0–0.5)
Eosinophils Relative: 4 %
HCT: 29.5 % — ABNORMAL LOW (ref 36.0–46.0)
Hemoglobin: 10.7 g/dL — ABNORMAL LOW (ref 12.0–15.0)
Immature Granulocytes: 0 %
Lymphocytes Relative: 44 %
Lymphs Abs: 2 10*3/uL (ref 0.7–4.0)
MCH: 28.6 pg (ref 26.0–34.0)
MCHC: 36.3 g/dL — ABNORMAL HIGH (ref 30.0–36.0)
MCV: 78.9 fL — ABNORMAL LOW (ref 80.0–100.0)
Monocytes Absolute: 0.3 10*3/uL (ref 0.1–1.0)
Monocytes Relative: 7 %
Neutro Abs: 2 10*3/uL (ref 1.7–7.7)
Neutrophils Relative %: 44 %
Platelets: 236 10*3/uL (ref 150–400)
RBC: 3.74 MIL/uL — ABNORMAL LOW (ref 3.87–5.11)
RDW: 14.6 % (ref 11.5–15.5)
WBC: 4.4 10*3/uL (ref 4.0–10.5)
nRBC: 0 % (ref 0.0–0.2)

## 2022-07-30 LAB — COMPREHENSIVE METABOLIC PANEL
ALT: 11 U/L (ref 0–44)
AST: 19 U/L (ref 15–41)
Albumin: 3.4 g/dL — ABNORMAL LOW (ref 3.5–5.0)
Alkaline Phosphatase: 61 U/L (ref 38–126)
Anion gap: 7 (ref 5–15)
BUN: 15 mg/dL (ref 6–20)
CO2: 26 mmol/L (ref 22–32)
Calcium: 9 mg/dL (ref 8.9–10.3)
Chloride: 104 mmol/L (ref 98–111)
Creatinine, Ser: 0.66 mg/dL (ref 0.44–1.00)
GFR, Estimated: >60 mL/min (ref >60)
Glucose, Bld: 83 mg/dL (ref 70–99)
Potassium: 3.6 mmol/L (ref 3.5–5.1)
Sodium: 137 mmol/L (ref 135–145)
Total Bilirubin: 0.3 mg/dL (ref 0.3–1.2)
Total Protein: 7.2 g/dL (ref 6.5–8.1)

## 2022-07-30 MED ORDER — SODIUM CHLORIDE 0.9 % IV SOLN
200.0000 mg | Freq: Once | INTRAVENOUS | Status: AC
  Administered 2022-07-30: 200 mg via INTRAVENOUS
  Filled 2022-07-30: qty 8

## 2022-07-30 MED ORDER — HEPARIN SOD (PORK) LOCK FLUSH 100 UNIT/ML IV SOLN
500.0000 [IU] | Freq: Once | INTRAVENOUS | Status: AC | PRN
  Administered 2022-07-30: 500 [IU]
  Filled 2022-07-30: qty 5

## 2022-07-30 MED ORDER — HEPARIN SOD (PORK) LOCK FLUSH 100 UNIT/ML IV SOLN
250.0000 [IU] | Freq: Once | INTRAVENOUS | Status: DC | PRN
  Filled 2022-07-30: qty 5

## 2022-07-30 MED ORDER — SODIUM CHLORIDE 0.9 % IV SOLN
10.0000 mg | Freq: Once | INTRAVENOUS | Status: AC
  Administered 2022-07-30: 10 mg via INTRAVENOUS
  Filled 2022-07-30: qty 10

## 2022-07-30 MED ORDER — SODIUM CHLORIDE 0.9% FLUSH
10.0000 mL | INTRAVENOUS | Status: DC | PRN
  Administered 2022-07-30: 10 mL via INTRAVENOUS
  Filled 2022-07-30: qty 10

## 2022-07-30 MED ORDER — SODIUM CHLORIDE 0.9 % IV SOLN
Freq: Once | INTRAVENOUS | Status: AC
  Filled 2022-07-30: qty 250

## 2022-07-30 NOTE — Progress Notes (Signed)
Hematology/Oncology Consult note [facility]  Telephone:([phone] [phone]  Patient Care Team: Pcp, No as PCP - General [name], RN as Oncology Nurse Navigator [name], MD as Medical Oncologist (Oncology)   Name of the patient: Jean Morris  [id number]  October 26, 1975   Date of visit: 07/30/22  Diagnosis- stage IVb endometrioid endometrial cancer     Chief complaint/ Reason for visit-on treatment assessment prior to cycle 4 of palliative Keytruda  Heme/Onc history: Patient is a 47 year old female who has seen Dr. Logan Bores for irregular menstrual bleeding.  He has had an IUD in place and initially it was attribute it to use of IUD and was tried to be controlled with progesterone agents.  However due to worsening abdominal pain patient underwent a CT abdomen and pelvis with contrast on 12/25/2021 which showed 8 mm   Posterior lung base mass.  Retroperitoneal adenopathy measuring 2.6 cm additional retrocrural adenopathy.  Faint lucent lesion involving L2 vertebral body.  The uterus is anteverted and enlarged.  Endometrium is enlarged heterogeneous and irregular extending to the fundal myometrium.  Findings consistent with endometrial malignancy.  CT chest without contrast showed a 10 mm right lower lobe subpleural nodule.   Patient had endometrial biopsy which was consistent FIGO grade 2 endometrioid endometrial carcinoma.  Patient was seen by GYN oncology and hopes to take her for surgery if she has good response to neoadjuvant chemotherapy.MSI unstable.  BRAF negative.  Loss of major and minor MMR proteins MLH1 and PMS2 less than 5% of tumor expression.  MLH1 hyper methylation present.  This is most likely due to somatic epigenetic modification which can be seen in about 77% of sporadic endometrial cancers.   Patient completed 6 cycles of CarboTaxol Keytruda chemotherapy and is currently on maintenance Keytruda.  PET CT scan on 05/19/2022  showed enlarged uterus with hypermetabolic endometrial thickening.  Improving left periaortic nodal metastases.  No definitive bone metastases was noted.  Plan is to proceed with cytoreductive surgery at [facility].  Patient underwent hysterectomy and bilateral salpingo-oophorectomy on 06/10/2022.  Final pathology showed residual endometrioid adenocarcinoma of the endometrium FIGO grade 2 with marked treatment effect invading 18 mm and a 34 mm thick myometrium.  Fallopian tubes and ovaries were negative for malignancy.  Pelvic washings were negative for malignancy.   Patient is currently on maintenance Keytruda  Interval history-tolerating Keytruda well without any significant side effects.  Reports occasional pulling pain in her lower abdomen when she lies down.  Overall symptoms are self-limited.  She is feeling much better since surgery and will start working next week  ECOG PS- 1 Pain scale- 2 Opioid associated constipation- no  Review of systems- Review of Systems  Constitutional:  Negative for chills, fever, malaise/fatigue and weight loss.  HENT:  Negative for congestion, ear discharge and nosebleeds.   Eyes:  Negative for blurred vision.  Respiratory:  Negative for cough, hemoptysis, sputum production, shortness of breath and wheezing.   Cardiovascular:  Negative for chest pain, palpitations, orthopnea and claudication.  Gastrointestinal:  Negative for abdominal pain, blood in stool, constipation, diarrhea, heartburn, melena, nausea and vomiting.  Genitourinary:  Negative for dysuria, flank pain, frequency, hematuria and urgency.  Musculoskeletal:  Negative for back pain, joint pain and myalgias.  Skin:  Negative for rash.  Neurological:  Negative for dizziness, tingling, focal weakness, seizures, weakness and headaches.  Endo/Heme/Allergies:  Does not bruise/bleed easily.  Psychiatric/Behavioral:  Negative for depression and suicidal ideas. The patient  does not have insomnia.        Allergies  Allergen Reactions   Bee Pollen Nausea And Vomiting    Itchy, swelling, watery eyes, runny nose.   Pollen Extract Nausea And Vomiting    Itchy, swelling, watery eyes, runny nose.     Past Medical History:  Diagnosis Date   Anemia    Endometrial cancer (HCC)    Hypertension    Menorrhagia    Pulmonary embolism [facility])      Past Surgical History:  Procedure Laterality Date   CESAREAN SECTION  1994   COLONOSCOPY WITH PROPOFOL N/A 08/06/2021   Procedure: COLONOSCOPY WITH PROPOFOL;  Surgeon: Wyline Mood, MD;  Location: [name] ENDOSCOPY;  Service: Gastroenterology;  Laterality: N/A;   ESOPHAGOGASTRODUODENOSCOPY (EGD) WITH PROPOFOL N/A 02/09/2020   Procedure: ESOPHAGOGASTRODUODENOSCOPY (EGD) WITH PROPOFOL;  Surgeon: Wyline Mood, MD;  Location: [facility] ENDOSCOPY;  Service: Gastroenterology;  Laterality: N/A;   IR IMAGING GUIDED PORT INSERTION  01/17/2022    Social History   Socioeconomic History   Marital status: Media planner    Spouse name: Not on file   Number of children: Not on file   Years of education: Not on file   Highest education level: Not on file  Occupational History   Not on file  Tobacco Use   Smoking status: Never   Smokeless tobacco: Never  Vaping Use   Vaping Use: Never used  Substance and Sexual Activity   Alcohol use: No   Drug use: No   Sexual activity: Not Currently    Birth control/protection: I.U.D.    Comment: patient states MD said did not see in CT today 01/17/2022  Other Topics Concern   Not on file  Social History Narrative   Lives with [facility], partner, and no pets.   Social Determinants of Health   Financial Resource Strain: Not on file  Food Insecurity: Not on file  Transportation Needs: Not on file  Physical Activity: Not on file  Stress: Not on file  Social Connections: Not on file  Intimate Partner Violence: Not on file    Family History  Problem Relation Age of Onset   Hypertension Mother    Stroke Mother     Heart attack Mother    Cancer Father    Hypertension Father    Cancer Sister    Cancer Sister    Cancer Brother    Hypertension Other      Current Outpatient Medications:    benzonatate (TESSALON) 100 MG capsule, Take 1 capsule (100 mg total) by mouth 3 (three) times daily as needed for cough., Disp: 20 capsule, Rfl: 0   dexamethasone (DECADRON) 4 MG tablet, Take 2 tablets by mouth starting the day after chemotherapy for 3 days. Take with food., Disp: 30 tablet, Rfl: 1   fluconazole (DIFLUCAN) 150 MG tablet, Take 1 tablet (150 mg total) by mouth daily. Take 1 tablet (150 mg) by mouth. Three days later, take second tablet., Disp: 2 tablet, Rfl: 0   lidocaine-prilocaine (EMLA) cream, Apply to port site as directed, Disp: 30 g, Rfl: 3   LORazepam (ATIVAN) 0.5 MG tablet, Take 1 tablet (0.5 mg total) by mouth every 8 (eight) hours., Disp: 30 tablet, Rfl: 0   magnesium hydroxide (MILK OF MAGNESIA) 400 MG/5ML suspension, Take 15 mLs by mouth daily as needed for moderate constipation. Take if no bowel movement for 2 days., Disp: , Rfl:    methocarbamol (ROBAXIN) 500 MG tablet, Take 1 tablet (500 mg total) by mouth  2 (two) times daily as needed for muscle spasms., Disp: 30 tablet, Rfl: 0   nystatin (MYCOSTATIN/NYSTOP) powder, Apply 1 Application topically 3 (three) times daily., Disp: 30 g, Rfl: 3   oxyCODONE (OXY IR/ROXICODONE) 5 MG immediate release tablet, Take 1 tablet (5 mg total) by mouth every 4 (four) hours as needed for severe pain., Disp: 120 tablet, Rfl: 0   pantoprazole (PROTONIX) 40 MG tablet, Take 1 tablet (40 mg total) by mouth daily., Disp: 30 tablet, Rfl: 2   polyethylene glycol (MIRALAX) 17 g packet, Take 17 g by mouth daily. To prevent constipation, Disp: 30 each, Rfl: 3   potassium chloride SA (KLOR-CON M) 20 MEQ tablet, Take 1 tablet (20 mEq total) by mouth 2 (two) times daily. Dissolve tablets, Disp: 60 tablet, Rfl: 2   senna (SENOKOT) 8.6 MG TABS tablet, Take 1-2 tablets  (8.6-17.2 mg total) by mouth daily. To prevent constipation, Disp: 120 tablet, Rfl: 0   levonorgestrel (MIRENA) 20 MCG/24HR IUD, 1 Intra Uterine Device (1 each total) by Intrauterine route once. (Patient not taking: Reported on 06/18/2022), Disp: 1 each, Rfl: 0   prochlorperazine (COMPAZINE) 10 MG tablet, Take 1 tablet (10 mg total) by mouth every 6 (six) hours as needed for nausea or vomiting. (Patient not taking: Reported on 06/18/2022), Disp: 30 tablet, Rfl: 1 No current facility-administered medications for this visit.  Facility-Administered Medications Ordered in Other Visits:    dexamethasone (DECADRON) 10 mg in sodium chloride 0.9 % 50 mL IVPB, 10 mg, Intravenous, Once, [name], MD, Last Rate: 204 mL/hr at 07/30/22 0932, 10 mg at 07/30/22 0932   heparin lock flush 100 unit/mL, 250 Units, Intracatheter, Once PRN, [name], MD   pembrolizumab [facility]) 200 mg in sodium chloride 0.9 % 50 mL chemo infusion, 200 mg, Intravenous, Once, [name], MD  Physical exam:  Vitals:   07/30/22 0849  BP: 117/82  Pulse: 94  Resp: 18  Temp: (!) 97.1 F (36.2 C)  TempSrc: Tympanic  SpO2: 100%  Weight: 231 lb 3.2 oz (104.9 kg)  Height: 5\' 5"  (1.651 m)   Physical Exam Cardiovascular:     Rate and Rhythm: Normal rate and regular rhythm.     Heart sounds: Normal heart sounds.  Pulmonary:     Effort: Pulmonary effort is normal.     Breath sounds: Normal breath sounds.  Abdominal:     General: Bowel sounds are normal.     Palpations: Abdomen is soft.  Skin:    General: Skin is warm and dry.  Neurological:     Mental Status: She is alert and oriented to person, place, and time.         Latest Ref Rng & Units 07/30/2022    8:42 AM  CMP  Glucose 70 - 99 mg/dL 83   BUN 6 - 20 mg/dL 15   Creatinine 5.78 - 1.00 mg/dL 4.69   Sodium 629 - 528 mmol/L 137   Potassium 3.5 - 5.1 mmol/L 3.6   Chloride 98 - 111 mmol/L 104   CO2 22 - 32 mmol/L 26   Calcium 8.9 - 10.3 mg/dL 9.0    Total Protein 6.5 - 8.1 g/dL 7.2   Total Bilirubin 0.3 - 1.2 mg/dL 0.3   Alkaline Phos 38 - 126 U/L 61   AST 15 - 41 U/L 19   ALT 0 - 44 U/L 11       Latest Ref Rng & Units 07/30/2022    8:42 AM  CBC  WBC 4.0 - 10.5 K/uL 4.4   Hemoglobin 12.0 - 15.0 g/dL 52.8   Hematocrit 41.3 - 46.0 % 29.5   Platelets 150 - 400 K/uL 236      Assessment and plan- Patient is a 47 y.o. female with stage IV endometrioid endometrial carcinoma T2 N2 M1. She is here for treatment assessment prior to cycle 4 of palliative Keytruda  Counts okay to proceed with cycle 4 of palliative Keytruda today.  She will be seen by covering NP in 3 weeks for cycle 5 and I plan to increase her dose from 200 mg to 400 mg which she will be getting every 6 weeks instead of every 3 weeks and we will plan to do 12 such cycles if she continues to have stable disease/remain in remission.  Repeat PET CT scan 5 weeks from now.  I will see her back 9 weeks from now for cycle 6 of Keytruda.  If there is residual intra-abdominal adenopathy noted on PET scan we will consider referring her to radiation oncology  Normocytic anemia: Patient has baseline normocytic anemia which which has been gradually improving from the eights in February 20 24-10.7 presently.  Continue to monitor   Visit Diagnosis 1. Endometrial cancer (HCC)   2. Encounter for antineoplastic immunotherapy      Dr. Owens Shark, MD, MPH [facility] at [facility] [phone] 07/30/2022 9:35 AM

## 2022-07-30 NOTE — Patient Instructions (Signed)
[place] [facility]  Discharge Instructions: Thank you for choosing [facility] to provide your oncology and hematology care.  If you have a lab appointment with the Cancer Center, please go directly to the Cancer Center and check in at the registration area.  Wear comfortable clothing and clothing appropriate for easy access to any Portacath or PICC line.   We strive to give you quality time with your provider. You may need to reschedule your appointment if you arrive late (15 or more minutes).  Arriving late affects you and other patients whose appointments are after yours.  Also, if you miss three or more appointments without notifying the office, you may be dismissed from the clinic at the provider's discretion.      For prescription refill requests, have your pharmacy contact our office and allow 72 hours for refills to be completed.    Today you received the following chemotherapy and/or immunotherapy agents:Keytruda      To help prevent nausea and vomiting after your treatment, we encourage you to take your nausea medication as directed.  BELOW ARE SYMPTOMS THAT SHOULD BE REPORTED IMMEDIATELY: *FEVER GREATER THAN 100.4 F (38 C) OR HIGHER *CHILLS OR SWEATING *NAUSEA AND VOMITING THAT IS NOT CONTROLLED WITH YOUR NAUSEA MEDICATION *UNUSUAL SHORTNESS OF BREATH *UNUSUAL BRUISING OR BLEEDING *URINARY PROBLEMS (pain or burning when urinating, or frequent urination) *BOWEL PROBLEMS (unusual diarrhea, constipation, pain near the anus) TENDERNESS IN MOUTH AND THROAT WITH OR WITHOUT PRESENCE OF ULCERS (sore throat, sores in mouth, or a toothache) UNUSUAL RASH, SWELLING OR PAIN  UNUSUAL VAGINAL DISCHARGE OR ITCHING   Items with * indicate a potential emergency and should be followed up as soon as possible or go to the Emergency Department if any problems should occur.  Please show the CHEMOTHERAPY ALERT CARD or IMMUNOTHERAPY ALERT CARD at check-in to  the Emergency Department and triage nurse.  Should you have questions after your visit or need to cancel or reschedule your appointment, please contact [place] [facility]  [phone] and follow the prompts.  Office hours are 8:00 a.m. to 4:30 p.m. Monday - Friday. Please note that voicemails left after 4:00 p.m. may not be returned until the following business day.  We are closed weekends and major holidays. You have access to a nurse at all times for urgent questions. Please call the main number to the clinic [phone] and follow the prompts.  For any non-urgent questions, you may also contact your provider using MyChart. We now offer e-Visits for anyone 41 and older to request care online for non-urgent symptoms. For details visit [email or web address].   Also download the MyChart app! Go to the app store, search "MyChart", open the app, select [place], and log in with your MyChart username and password.   Pembrolizumab Injection What is this medication? PEMBROLIZUMAB (PEM broe LIZ ue mab) treats some types of cancer. It works by helping your immune system slow or stop the spread of cancer cells. It is a monoclonal antibody. This medicine may be used for other purposes; ask your health care provider or pharmacist if you have questions. COMMON BRAND NAME(S): Keytruda What should I tell my care team before I take this medication? They need to know if you have any of these conditions: Allogeneic stem cell transplant (uses someone else's stem cells) Autoimmune diseases, such as Crohn disease, ulcerative colitis, lupus History of chest radiation Nervous system problems, such as Guillain-Barre syndrome, myasthenia gravis  Organ transplant An unusual or allergic reaction to pembrolizumab, other medications, foods, dyes, or preservatives Pregnant or trying to get pregnant Breast-feeding How should I use this medication? This medication is injected into a vein.  It is given by your care team in a hospital or clinic setting. A special MedGuide will be given to you before each treatment. Be sure to read this information carefully each time. Talk to your care team about the use of this medication in children. While it may be prescribed for children as young as 6 months for selected conditions, precautions do apply. Overdosage: If you think you have taken too much of this medicine contact a poison control center or emergency room at once. NOTE: This medicine is only for you. Do not share this medicine with others. What if I miss a dose? Keep appointments for follow-up doses. It is important not to miss your dose. Call your care team if you are unable to keep an appointment. What may interact with this medication? Interactions have not been studied. This list may not describe all possible interactions. Give your health care provider a list of all the medicines, herbs, non-prescription drugs, or dietary supplements you use. Also tell them if you smoke, drink alcohol, or use illegal drugs. Some items may interact with your medicine. What should I watch for while using this medication? Your condition will be monitored carefully while you are receiving this medication. You may need blood work while taking this medication. This medication may cause serious skin reactions. They can happen weeks to months after starting the medication. Contact your care team right away if you notice fevers or flu-like symptoms with a rash. The rash may be red or purple and then turn into blisters or peeling of the skin. You may also notice a red rash with swelling of the face, lips, or lymph nodes in your neck or under your arms. Tell your care team right away if you have any change in your eyesight. Talk to your care team if you may be pregnant. Serious birth defects can occur if you take this medication during pregnancy and for 4 months after the last dose. You will need a negative  pregnancy test before starting this medication. Contraception is recommended while taking this medication and for 4 months after the last dose. Your care team can help you find the option that works for you. Do not breastfeed while taking this medication and for 4 months after the last dose. What side effects may I notice from receiving this medication? Side effects that you should report to your care team as soon as possible: Allergic reactions--skin rash, itching, hives, swelling of the face, lips, tongue, or throat Dry cough, shortness of breath or trouble breathing Eye pain, redness, irritation, or discharge with blurry or decreased vision Heart muscle inflammation--unusual weakness or fatigue, shortness of breath, chest pain, fast or irregular heartbeat, dizziness, swelling of the ankles, feet, or hands Hormone gland problems--headache, sensitivity to light, unusual weakness or fatigue, dizziness, fast or irregular heartbeat, increased sensitivity to cold or heat, excessive sweating, constipation, hair loss, increased thirst or amount of urine, tremors or shaking, irritability Infusion reactions--chest pain, shortness of breath or trouble breathing, feeling faint or lightheaded Kidney injury (glomerulonephritis)--decrease in the amount of urine, red or dark brown urine, foamy or bubbly urine, swelling of the ankles, hands, or feet Liver injury--right upper belly pain, loss of appetite, nausea, light-colored stool, dark yellow or brown urine, yellowing skin or eyes, unusual weakness  or fatigue Pain, tingling, or numbness in the hands or feet, muscle weakness, change in vision, confusion or trouble speaking, loss of balance or coordination, trouble walking, seizures Rash, fever, and swollen lymph nodes Redness, blistering, peeling, or loosening of the skin, including inside the mouth Sudden or severe stomach pain, bloody diarrhea, fever, nausea, vomiting Side effects that usually do not require  medical attention (report to your care team if they continue or are bothersome): Bone, joint, or muscle pain Diarrhea Fatigue Loss of appetite Nausea Skin rash This list may not describe all possible side effects. Call your doctor for medical advice about side effects. You may report side effects to FDA at 1-800-FDA-1088. Where should I keep my medication? This medication is given in a hospital or clinic. It will not be stored at home. NOTE: This sheet is a summary. It may not cover all possible information. If you have questions about this medicine, talk to your doctor, pharmacist, or health care provider.  2024 Elsevier/Gold Standard (2021-06-25 00:00:00)

## 2022-07-30 NOTE — Progress Notes (Signed)
Nutrition Follow-up:  Patient with endometrial stage IV cancer.  Patient s/p hysterectomy and bilateral salpingo-oophorectomy on 06/10/22.  Patient on keytruda.  Met with patient during infusion.  Patient eating well.  Drinking protein shake 1 time a day.  Denies any nutrition impact symptoms.    Medications: reviewed  Labs: reviewed  Anthropometrics:   Weight 231 lb 3.2 oz today (increased) 210 lb om 4/24 205 lb on 4/10 214 lb on 3/13 244 lb on 12/6  NUTRITION DIAGNOSIS: Inadequate oral intake improved   INTERVENTION:  Continue good sources of protein.   Can drop off protein shake if reaching protein goals with solid foods. Encouraged plant foods in diet. Encouraged walking at least 30 minutes daily Contact information given to patient.  She will contact RD if needed in the future.     NEXT VISIT: no follow-up RD available as needed  Kapena Hamme B. Freida Busman, RD, LDN Registered Dietitian [phone]

## 2022-08-06 ENCOUNTER — Telehealth: Payer: Self-pay | Admitting: *Deleted

## 2022-08-06 ENCOUNTER — Other Ambulatory Visit: Payer: Self-pay | Admitting: *Deleted

## 2022-08-06 NOTE — Progress Notes (Signed)
Letter for going back to work

## 2022-08-06 NOTE — Telephone Encounter (Signed)
Pt is requesting a return to work note for tomorrow.  States she has spoken with the team and information has been given. If someone could please call her back.

## 2022-08-08 ENCOUNTER — Encounter: Payer: Self-pay | Admitting: Nurse Practitioner

## 2022-08-08 ENCOUNTER — Encounter: Payer: Self-pay | Admitting: Oncology

## 2022-08-18 ENCOUNTER — Telehealth: Payer: Self-pay | Admitting: *Deleted

## 2022-08-18 NOTE — Telephone Encounter (Signed)
Jean Morris scan scheduled in July- denied by insurance. Pt scheduled to see Jean Morris on Wednesday. Dr. Leonard SchwartzLorain Childes so we can determine what imaging is needed. She is a Jean Morris Robert pt.

## 2022-08-19 MED FILL — Dexamethasone Sodium Phosphate Inj 100 MG/10ML: INTRAMUSCULAR | Qty: 1 | Status: AC

## 2022-08-20 ENCOUNTER — Inpatient Hospital Stay: Payer: BC Managed Care – PPO

## 2022-08-20 ENCOUNTER — Encounter: Payer: Self-pay | Admitting: Internal Medicine

## 2022-08-20 ENCOUNTER — Inpatient Hospital Stay (HOSPITAL_BASED_OUTPATIENT_CLINIC_OR_DEPARTMENT_OTHER): Payer: BC Managed Care – PPO | Admitting: Internal Medicine

## 2022-08-20 VITALS — BP 113/73 | HR 85 | Temp 98.5°F | Resp 18 | Ht 65.0 in | Wt 241.0 lb

## 2022-08-20 DIAGNOSIS — Z90722 Acquired absence of ovaries, bilateral: Secondary | ICD-10-CM | POA: Diagnosis not present

## 2022-08-20 DIAGNOSIS — R918 Other nonspecific abnormal finding of lung field: Secondary | ICD-10-CM | POA: Diagnosis not present

## 2022-08-20 DIAGNOSIS — C772 Secondary and unspecified malignant neoplasm of intra-abdominal lymph nodes: Secondary | ICD-10-CM | POA: Diagnosis not present

## 2022-08-20 DIAGNOSIS — C541 Malignant neoplasm of endometrium: Secondary | ICD-10-CM

## 2022-08-20 DIAGNOSIS — Z79899 Other long term (current) drug therapy: Secondary | ICD-10-CM | POA: Diagnosis not present

## 2022-08-20 DIAGNOSIS — I10 Essential (primary) hypertension: Secondary | ICD-10-CM | POA: Diagnosis not present

## 2022-08-20 DIAGNOSIS — Z86711 Personal history of pulmonary embolism: Secondary | ICD-10-CM | POA: Diagnosis not present

## 2022-08-20 DIAGNOSIS — G629 Polyneuropathy, unspecified: Secondary | ICD-10-CM | POA: Diagnosis not present

## 2022-08-20 DIAGNOSIS — D649 Anemia, unspecified: Secondary | ICD-10-CM | POA: Diagnosis not present

## 2022-08-20 DIAGNOSIS — R1030 Lower abdominal pain, unspecified: Secondary | ICD-10-CM | POA: Diagnosis not present

## 2022-08-20 DIAGNOSIS — Z809 Family history of malignant neoplasm, unspecified: Secondary | ICD-10-CM | POA: Diagnosis not present

## 2022-08-20 LAB — CBC WITH DIFFERENTIAL/PLATELET
Abs Immature Granulocytes: 0.01 10*3/uL (ref 0.00–0.07)
Basophils Absolute: 0 10*3/uL (ref 0.0–0.1)
Basophils Relative: 1 %
Eosinophils Absolute: 0.2 10*3/uL (ref 0.0–0.5)
Eosinophils Relative: 4 %
HCT: 29.5 % — ABNORMAL LOW (ref 36.0–46.0)
Hemoglobin: 10.7 g/dL — ABNORMAL LOW (ref 12.0–15.0)
Immature Granulocytes: 0 %
Lymphocytes Relative: 42 %
Lymphs Abs: 1.7 10*3/uL (ref 0.7–4.0)
MCH: 27.6 pg (ref 26.0–34.0)
MCHC: 36.3 g/dL — ABNORMAL HIGH (ref 30.0–36.0)
MCV: 76.2 fL — ABNORMAL LOW (ref 80.0–100.0)
Monocytes Absolute: 0.2 10*3/uL (ref 0.1–1.0)
Monocytes Relative: 6 %
Neutro Abs: 1.9 10*3/uL (ref 1.7–7.7)
Neutrophils Relative %: 47 %
Platelets: 228 10*3/uL (ref 150–400)
RBC: 3.87 MIL/uL (ref 3.87–5.11)
RDW: 14.6 % (ref 11.5–15.5)
WBC: 4 10*3/uL (ref 4.0–10.5)
nRBC: 0 % (ref 0.0–0.2)

## 2022-08-20 LAB — COMPREHENSIVE METABOLIC PANEL
ALT: 10 U/L (ref 0–44)
AST: 18 U/L (ref 15–41)
Albumin: 3.4 g/dL — ABNORMAL LOW (ref 3.5–5.0)
Alkaline Phosphatase: 54 U/L (ref 38–126)
Anion gap: 6 (ref 5–15)
BUN: 16 mg/dL (ref 6–20)
CO2: 26 mmol/L (ref 22–32)
Calcium: 9.1 mg/dL (ref 8.9–10.3)
Chloride: 104 mmol/L (ref 98–111)
Creatinine, Ser: 0.64 mg/dL (ref 0.44–1.00)
GFR, Estimated: >60 mL/min (ref >60)
Glucose, Bld: 99 mg/dL (ref 70–99)
Potassium: 3.5 mmol/L (ref 3.5–5.1)
Sodium: 136 mmol/L (ref 135–145)
Total Bilirubin: 0.4 mg/dL (ref 0.3–1.2)
Total Protein: 7.1 g/dL (ref 6.5–8.1)

## 2022-08-20 MED ORDER — SODIUM CHLORIDE 0.9 % IV SOLN
400.0000 mg | Freq: Once | INTRAVENOUS | Status: AC
  Administered 2022-08-20: 400 mg via INTRAVENOUS
  Filled 2022-08-20: qty 16

## 2022-08-20 MED ORDER — SODIUM CHLORIDE 0.9 % IV SOLN
Freq: Once | INTRAVENOUS | Status: AC
  Filled 2022-08-20: qty 250

## 2022-08-20 MED ORDER — HEPARIN SOD (PORK) LOCK FLUSH 100 UNIT/ML IV SOLN
500.0000 [IU] | Freq: Once | INTRAVENOUS | Status: DC
  Filled 2022-08-20: qty 5

## 2022-08-20 MED ORDER — GABAPENTIN 100 MG PO CAPS: 300.0000 mg | ORAL_CAPSULE | Freq: Every day | ORAL | 1 refills | Status: DC

## 2022-08-20 MED ORDER — SODIUM CHLORIDE 0.9 % IV SOLN
10.0000 mg | Freq: Once | INTRAVENOUS | Status: AC
  Administered 2022-08-20: 10 mg via INTRAVENOUS
  Filled 2022-08-20: qty 10

## 2022-08-20 MED ORDER — SODIUM CHLORIDE 0.9% FLUSH
10.0000 mL | Freq: Once | INTRAVENOUS | Status: AC
  Administered 2022-08-20: 10 mL via INTRAVENOUS
  Filled 2022-08-20: qty 10

## 2022-08-20 MED ORDER — HEPARIN SOD (PORK) LOCK FLUSH 100 UNIT/ML IV SOLN
500.0000 [IU] | Freq: Once | INTRAVENOUS | Status: AC | PRN
  Administered 2022-08-20: 500 [IU]
  Filled 2022-08-20: qty 5

## 2022-08-20 NOTE — Assessment & Plan Note (Addendum)
#   stage IV endometrioid endometrial carcinoma T2 N2 M1-currently on palliative Keytruda  # Counts okay to proceed with cycle 5  of palliative Keytruda today.  PET scan denied by insurance.  Patient states that she will call insurance to find out the reason.  If PET scan continues to be denied by insurance-discussed regarding ordering a CT scan for further evaluation.  If there is residual intra-abdominal adenopathy noted on PET scan we will consider referring her to radiation oncology.  #  Abdominal pain- on oxycodone 1 at bedtime prn  # PN- 2-Bil LE- Left more than right- recommend gabpentin 100 mg at bedtime; referral to [facility]- re: left foot drop/neuropathy.   # DISPOSITION:  # referral to [facility]- re: left foot drop/neuropathy # [name] # follow up as planned- Dr.Rao; Labs- cbc/cmp; Thyroid profile- Dr.B

## 2022-08-20 NOTE — Progress Notes (Signed)
[facility] CONSULT NOTE  Patient Care Team: Pcp, No as PCP - General [name], RN as Oncology Nurse Navigator [name], MD as Medical Oncologist (Oncology)  CHIEF COMPLAINTS/PURPOSE OF CONSULTATION: Endometrial cancer  Oncology History  Endometrial cancer (HCC)  01/15/2022 Initial Diagnosis   Endometrial cancer (HCC)   01/15/2022 Cancer Staging   Staging form: Corpus Uteri - Carcinoma and Carcinosarcoma, AJCC 8th Edition - Clinical stage from 01/15/2022: FIGO Stage IVB (cT2, cN2, cM1) - Signed by [name], MD on 01/15/2022 Stage prefix: Initial diagnosis   01/21/2022 -  Chemotherapy   Patient is on Treatment Plan : endometrial Carboplatin + Paclitaxel keytruda q21d        HISTORY OF PRESENTING ILLNESS: Patient ambulating-independently/.Accompanied by family.   Jean Morris 47 y.o.  female patient with history of endometrial cancer-metastatic currently on Jean Morris is here for follow-up.  Patient tolerating Keytruda fairly well.  Denies any nausea vomiting diarrhea.  Denies any skin rash.  Review of Systems  Constitutional:  Negative for chills, diaphoresis, fever, malaise/fatigue and weight loss.  HENT:  Negative for nosebleeds and sore throat.   Eyes:  Negative for double vision.  Respiratory:  Negative for cough, hemoptysis, sputum production, shortness of breath and wheezing.   Cardiovascular:  Negative for chest pain, palpitations, orthopnea and leg swelling.  Gastrointestinal:  Positive for abdominal pain. Negative for blood in stool, constipation, diarrhea, heartburn, melena, nausea and vomiting.  Genitourinary:  Negative for dysuria, frequency and urgency.  Musculoskeletal:  Negative for back pain and joint pain.  Skin: Negative.  Negative for itching and rash.  Neurological:  Negative for dizziness, tingling, focal weakness, weakness and headaches.  Endo/Heme/Allergies:  Does not bruise/bleed easily.  Psychiatric/Behavioral:   Negative for depression. The patient is not nervous/anxious and does not have insomnia.     MEDICAL HISTORY:  Past Medical History:  Diagnosis Date   Anemia    Endometrial cancer (HCC)    Hypertension    Menorrhagia    Pulmonary embolism (HCC)     SURGICAL HISTORY: Past Surgical History:  Procedure Laterality Date   CESAREAN SECTION  1994   COLONOSCOPY WITH PROPOFOL N/A 08/06/2021   Procedure: COLONOSCOPY WITH PROPOFOL;  Surgeon: Wyline Mood, MD;  Location: [facility] ENDOSCOPY;  Service: Gastroenterology;  Laterality: N/A;   ESOPHAGOGASTRODUODENOSCOPY (EGD) WITH PROPOFOL N/A 02/09/2020   Procedure: ESOPHAGOGASTRODUODENOSCOPY (EGD) WITH PROPOFOL;  Surgeon: Wyline Mood, MD;  Location: [facility] ENDOSCOPY;  Service: Gastroenterology;  Laterality: N/A;   IR IMAGING GUIDED PORT INSERTION  01/17/2022    SOCIAL HISTORY: Social History   Socioeconomic History   Marital status: Media planner    Spouse name: Not on file   Number of children: Not on file   Years of education: Not on file   Highest education level: Not on file  Occupational History   Not on file  Tobacco Use   Smoking status: Never   Smokeless tobacco: Never  Vaping Use   Vaping Use: Never used  Substance and Sexual Activity   Alcohol use: No   Drug use: No   Sexual activity: Not Currently    Birth control/protection: I.U.D.    Comment: patient states MD said did not see in CT today 01/17/2022  Other Topics Concern   Not on file  Social History Narrative   Lives with [facility], partner, and no pets.   Social Determinants of Health   Financial Resource Strain: Not on file  Food Insecurity: Not on file  Transportation Needs: Not on file  Physical Activity: Not on file  Stress: Not on file  Social Connections: Not on file  Intimate Partner Violence: Not on file    FAMILY HISTORY: Family History  Problem Relation Age of Onset   Hypertension Mother    Stroke Mother    Heart attack Mother    Cancer Father     Hypertension Father    Cancer Sister    Cancer Sister    Cancer Brother    Hypertension Other     ALLERGIES:  is allergic to bee pollen and pollen extract.  MEDICATIONS:  Current Outpatient Medications  Medication Sig Dispense Refill   apixaban (ELIQUIS) 2.5 MG TABS tablet Take 2.5 mg by mouth 2 (two) times daily.     dexamethasone (DECADRON) 4 MG tablet Take 2 tablets by mouth starting the day after chemotherapy for 3 days. Take with food. 30 tablet 1   fluconazole (DIFLUCAN) 150 MG tablet Take 1 tablet (150 mg total) by mouth daily. Take 1 tablet (150 mg) by mouth. Three days later, take second tablet. 2 tablet 0   gabapentin (NEURONTIN) 100 MG capsule Take 3 capsules (300 mg total) by mouth at bedtime. 60 capsule 1   lidocaine-prilocaine (EMLA) cream Apply to port site as directed 30 g 3   LORazepam (ATIVAN) 0.5 MG tablet Take 1 tablet (0.5 mg total) by mouth every 8 (eight) hours. 30 tablet 0   magnesium hydroxide (MILK OF MAGNESIA) 400 MG/5ML suspension Take 15 mLs by mouth daily as needed for moderate constipation. Take if no bowel movement for 2 days.     methocarbamol (ROBAXIN) 500 MG tablet Take 1 tablet (500 mg total) by mouth 2 (two) times daily as needed for muscle spasms. 30 tablet 0   nystatin (MYCOSTATIN/NYSTOP) powder Apply 1 Application topically 3 (three) times daily. 30 g 3   oxyCODONE (OXY IR/ROXICODONE) 5 MG immediate release tablet Take 1 tablet (5 mg total) by mouth every 4 (four) hours as needed for severe pain. 120 tablet 0   pantoprazole (PROTONIX) 40 MG tablet Take 1 tablet (40 mg total) by mouth daily. 30 tablet 2   polyethylene glycol (MIRALAX) 17 g packet Take 17 g by mouth daily. To prevent constipation 30 each 3   potassium chloride SA (KLOR-CON M) 20 MEQ tablet Take 1 tablet (20 mEq total) by mouth 2 (two) times daily. Dissolve tablets 60 tablet 2   senna (SENOKOT) 8.6 MG TABS tablet Take 1-2 tablets (8.6-17.2 mg total) by mouth daily. To prevent constipation  120 tablet 0   benzonatate (TESSALON) 100 MG capsule Take 1 capsule (100 mg total) by mouth 3 (three) times daily as needed for cough. (Patient not taking: Reported on 08/20/2022) 20 capsule 0   levonorgestrel (MIRENA) 20 MCG/24HR IUD 1 Intra Uterine Device (1 each total) by Intrauterine route once. (Patient not taking: Reported on 06/18/2022) 1 each 0   prochlorperazine (COMPAZINE) 10 MG tablet Take 1 tablet (10 mg total) by mouth every 6 (six) hours as needed for nausea or vomiting. (Patient not taking: Reported on 06/18/2022) 30 tablet 1   No current facility-administered medications for this visit.   Facility-Administered Medications Ordered in Other Visits  Medication Dose Route Frequency Provider Last Rate Last Admin   heparin lock flush 100 unit/mL  500 Units Intravenous Once [name], MD        PHYSICAL EXAMINATION:  Vitals:   08/20/22 0858  BP: 113/73  Pulse: 85  Resp: 18  Temp: 98.5 F (36.9 C)  SpO2: 99%   Filed Weights   08/20/22 0858  Weight: 241 lb (109.3 kg)    Physical Exam Vitals and nursing note reviewed.  HENT:     Head: Normocephalic and atraumatic.     Mouth/Throat:     Pharynx: Oropharynx is clear.  Eyes:     Extraocular Movements: Extraocular movements intact.     Pupils: Pupils are equal, round, and reactive to light.  Cardiovascular:     Rate and Rhythm: Normal rate and regular rhythm.  Pulmonary:     Comments: Decreased breath sounds bilaterally.  Abdominal:     Palpations: Abdomen is soft.  Musculoskeletal:        General: Normal range of motion.     Cervical back: Normal range of motion.  Skin:    General: Skin is warm.  Neurological:     General: No focal deficit present.     Mental Status: She is alert and oriented to person, place, and time.  Psychiatric:        Behavior: Behavior normal.        Judgment: Judgment normal.     LABORATORY DATA:  I have reviewed the data as listed Lab Results  Component Value Date   WBC 4.0  08/20/2022   HGB 10.7 (L) 08/20/2022   HCT 29.5 (L) 08/20/2022   MCV 76.2 (L) 08/20/2022   PLT 228 08/20/2022   Recent Labs    07/09/22 0907 07/30/22 0842 08/20/22 0848  NA 136 137 136  K 3.4* 3.6 3.5  CL 104 104 104  CO2 24 26 26   GLUCOSE 100* 83 99  BUN 12 15 16   CREATININE 0.69 0.66 0.64  CALCIUM 8.8* 9.0 9.1  GFRNONAA >60 >60 >60  PROT 7.2 7.2 7.1  ALBUMIN 3.4* 3.4* 3.4*  AST 20 19 18   ALT 8 11 10   ALKPHOS 53 61 54  BILITOT 0.4 0.3 0.4    RADIOGRAPHIC STUDIES: I have personally reviewed the radiological images as listed and agreed with the findings in the report. No results found.   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  PET scan denied by insurance.  Patient states that she will call insurance to find out the reason.  If PET scan continues to be denied by insurance-discussed regarding ordering a CT scan for further evaluation.  If there is residual intra-abdominal adenopathy noted on PET scan we will consider referring her to radiation oncology.  #  Abdominal pain- on oxycodone 1 at bedtime prn  # PN- 2-Bil LE- Left more than right- recommend gabpentin 100 mg at bedtime; referral to [facility]- re: left foot drop/neuropathy.   # DISPOSITION:  # referral to [facility]- re: left foot drop/neuropathy # Jean Morris # follow up as planned- Dr.Rao; Labs- cbc/cmp; Thyroid profile- Dr.B   Above plan of care was discussed with patient/family in detail.  My contact information was given to the patient/family.       Earna Coder, MD 08/20/2022 4:45 PM

## 2022-08-20 NOTE — Progress Notes (Signed)
Complains of neuropathy in left foot and leg. Also complains of abdominal cramping/tightness in lower left quadrant at night since hysterectomy in [place].

## 2022-08-20 NOTE — Patient Instructions (Signed)
[facility]  Discharge Instructions: Thank you for choosing [facility] to provide your oncology and hematology care.  If you have a lab appointment with the Cancer Center, please go directly to the Cancer Center and check in at the registration area.  Wear comfortable clothing and clothing appropriate for easy access to any Portacath or PICC line.   We strive to give you quality time with your provider. You may need to reschedule your appointment if you arrive late (15 or more minutes).  Arriving late affects you and other patients whose appointments are after yours.  Also, if you miss three or more appointments without notifying the office, you may be dismissed from the clinic at the provider's discretion.      For prescription refill requests, have your pharmacy contact our office and allow 72 hours for refills to be completed.    Today you received the following chemotherapy and/or immunotherapy agents:Keytruda      To help prevent nausea and vomiting after your treatment, we encourage you to take your nausea medication as directed.  BELOW ARE SYMPTOMS THAT SHOULD BE REPORTED IMMEDIATELY: *FEVER GREATER THAN 100.4 F (38 C) OR HIGHER *CHILLS OR SWEATING *NAUSEA AND VOMITING THAT IS NOT CONTROLLED WITH YOUR NAUSEA MEDICATION *UNUSUAL SHORTNESS OF BREATH *UNUSUAL BRUISING OR BLEEDING *URINARY PROBLEMS (pain or burning when urinating, or frequent urination) *BOWEL PROBLEMS (unusual diarrhea, constipation, pain near the anus) TENDERNESS IN MOUTH AND THROAT WITH OR WITHOUT PRESENCE OF ULCERS (sore throat, sores in mouth, or a toothache) UNUSUAL RASH, SWELLING OR PAIN  UNUSUAL VAGINAL DISCHARGE OR ITCHING   Items with * indicate a potential emergency and should be followed up as soon as possible or go to the Emergency Department if any problems should occur.  Please show the CHEMOTHERAPY ALERT CARD or IMMUNOTHERAPY ALERT CARD at check-in to  the Emergency Department and triage nurse.  Should you have questions after your visit or need to cancel or reschedule your appointment, please contact [facility]  [phone] and follow the prompts.  Office hours are 8:00 a.m. to 4:30 p.m. Monday - Friday. Please note that voicemails left after 4:00 p.m. may not be returned until the following business day.  We are closed weekends and major holidays. You have access to a nurse at all times for urgent questions. Please call the main number to the clinic [phone] and follow the prompts.  For any non-urgent questions, you may also contact your provider using MyChart. We now offer e-Visits for anyone 18 and older to request care online for non-urgent symptoms. For details visit [email or web address].   Also download the MyChart app! Go to the app store, search "MyChart", open the app, select [facility], and log in with your MyChart username and password.   Pembrolizumab Injection What is this medication? PEMBROLIZUMAB (PEM broe LIZ ue mab) treats some types of cancer. It works by helping your immune system slow or stop the spread of cancer cells. It is a monoclonal antibody. This medicine may be used for other purposes; ask your health care provider or pharmacist if you have questions. COMMON BRAND NAME(S): Keytruda What should I tell my care team before I take this medication? They need to know if you have any of these conditions: Allogeneic stem cell transplant (uses someone else's stem cells) Autoimmune diseases, such as Crohn disease, ulcerative colitis, lupus History of chest radiation Nervous system problems, such as Guillain-Barre syndrome, myasthenia gravis   Organ transplant An unusual or allergic reaction to pembrolizumab, other medications, foods, dyes, or preservatives Pregnant or trying to get pregnant Breast-feeding How should I use this medication? This medication is injected into a vein.  It is given by your care team in a hospital or clinic setting. A special MedGuide will be given to you before each treatment. Be sure to read this information carefully each time. Talk to your care team about the use of this medication in children. While it may be prescribed for children as young as 6 months for selected conditions, precautions do apply. Overdosage: If you think you have taken too much of this medicine contact a poison control center or emergency room at once. NOTE: This medicine is only for you. Do not share this medicine with others. What if I miss a dose? Keep appointments for follow-up doses. It is important not to miss your dose. Call your care team if you are unable to keep an appointment. What may interact with this medication? Interactions have not been studied. This list may not describe all possible interactions. Give your health care provider a list of all the medicines, herbs, non-prescription drugs, or dietary supplements you use. Also tell them if you smoke, drink alcohol, or use illegal drugs. Some items may interact with your medicine. What should I watch for while using this medication? Your condition will be monitored carefully while you are receiving this medication. You may need blood work while taking this medication. This medication may cause serious skin reactions. They can happen weeks to months after starting the medication. Contact your care team right away if you notice fevers or flu-like symptoms with a rash. The rash may be red or purple and then turn into blisters or peeling of the skin. You may also notice a red rash with swelling of the face, lips, or lymph nodes in your neck or under your arms. Tell your care team right away if you have any change in your eyesight. Talk to your care team if you may be pregnant. Serious birth defects can occur if you take this medication during pregnancy and for 4 months after the last dose. You will need a negative  pregnancy test before starting this medication. Contraception is recommended while taking this medication and for 4 months after the last dose. Your care team can help you find the option that works for you. Do not breastfeed while taking this medication and for 4 months after the last dose. What side effects may I notice from receiving this medication? Side effects that you should report to your care team as soon as possible: Allergic reactions--skin rash, itching, hives, swelling of the face, lips, tongue, or throat Dry cough, shortness of breath or trouble breathing Eye pain, redness, irritation, or discharge with blurry or decreased vision Heart muscle inflammation--unusual weakness or fatigue, shortness of breath, chest pain, fast or irregular heartbeat, dizziness, swelling of the ankles, feet, or hands Hormone gland problems--headache, sensitivity to light, unusual weakness or fatigue, dizziness, fast or irregular heartbeat, increased sensitivity to cold or heat, excessive sweating, constipation, hair loss, increased thirst or amount of urine, tremors or shaking, irritability Infusion reactions--chest pain, shortness of breath or trouble breathing, feeling faint or lightheaded Kidney injury (glomerulonephritis)--decrease in the amount of urine, red or dark brown urine, foamy or bubbly urine, swelling of the ankles, hands, or feet Liver injury--right upper belly pain, loss of appetite, nausea, light-colored stool, dark yellow or brown urine, yellowing skin or eyes, unusual weakness   or fatigue Pain, tingling, or numbness in the hands or feet, muscle weakness, change in vision, confusion or trouble speaking, loss of balance or coordination, trouble walking, seizures Rash, fever, and swollen lymph nodes Redness, blistering, peeling, or loosening of the skin, including inside the mouth Sudden or severe stomach pain, bloody diarrhea, fever, nausea, vomiting Side effects that usually do not require  medical attention (report to your care team if they continue or are bothersome): Bone, joint, or muscle pain Diarrhea Fatigue Loss of appetite Nausea Skin rash This list may not describe all possible side effects. Call your doctor for medical advice about side effects. You may report side effects to FDA at 1-800-FDA-1088. Where should I keep my medication? This medication is given in a hospital or clinic. It will not be stored at home. NOTE: This sheet is a summary. It may not cover all possible information. If you have questions about this medicine, talk to your doctor, pharmacist, or health care provider.  2024 Elsevier/Gold Standard (2021-06-25 00:00:00)  

## 2022-08-27 ENCOUNTER — Inpatient Hospital Stay: Payer: BC Managed Care – PPO | Attending: Obstetrics and Gynecology | Admitting: Occupational Therapy

## 2022-08-27 DIAGNOSIS — G629 Polyneuropathy, unspecified: Secondary | ICD-10-CM

## 2022-08-27 DIAGNOSIS — C541 Malignant neoplasm of endometrium: Secondary | ICD-10-CM | POA: Insufficient documentation

## 2022-08-27 DIAGNOSIS — M6281 Muscle weakness (generalized): Secondary | ICD-10-CM

## 2022-08-27 NOTE — Therapy (Signed)
[facility] [address] [place], [place], [address] Phone: [phone]   Fax:  [phone]  Occupational Therapy Screen:  Patient Details  Name: Jean Morris MRN: [id number] Date of Birth: Jan 29, 1976 No data recorded  Encounter Date: 08/27/2022   OT End of Session - 08/27/22 1108     Visit Number 0             Past Medical History:  Diagnosis Date   Anemia    Endometrial cancer (HCC)    Hypertension    Menorrhagia    Pulmonary embolism (HCC)     Past Surgical History:  Procedure Laterality Date   CESAREAN SECTION  1994   COLONOSCOPY WITH PROPOFOL N/A 08/06/2021   Procedure: COLONOSCOPY WITH PROPOFOL;  Surgeon: Wyline Mood, MD;  [facility] END[facility]COPY;  Service: Gastroenterology;  Laterality: N/A;   ESOPHAGOGASTRODUODENOSCOPY (EGD) WITH PROPOFOL N/A 02/09/2020   Procedure: ESOPHAGOGASTRODUODENOSCOPY (EGD) WITH PROPOFOL;  Surgeon: Wyline Mood, MD;  Location: [facility] ENDOSCOPY;  Service: Gastroenterology;  Laterality: N/A;   IR IMAGING GUIDED PORT INSERTION  01/17/2022    There were no vitals filed for this visit.   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  But my left ankle and foot hurts at times so bad on the top of it.  And his left ankle is just not working as well as my left leg just feels heavy.  Had 1 stumble but no falls in the last 6 months.  I do walk at work as well as in the evenings I try walk.                 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  PET scan denied by insurance.  Patient states that she will call insurance to find out the reason.  If PET scan continues to be denied by insurance-discussed regarding ordering a CT scan for further evaluation.  If there is  residual intra-abdominal adenopathy noted on PET scan we will consider referring her to radiation oncology.   #  Abdominal pain- on oxycodone 1 at bedtime prn   # PN- 2-Bil LE- Left more than right- recommend gabpentin 100 mg at bedtime; referral to [facility]- re: left foot drop/neuropathy.    # DISPOSITION:  # referral to [facility]- re: left foot drop/neuropathy # [name] # follow up as planned- Dr.Rao; Labs- cbc/cmp; Thyroid profile- Dr.B    OT SCREEN 08/27/22: Patient presented OT screen for referral for left foot drop as well as bilateral neuropathy. Patient report does have neuropathy in bilateral feet but the left is the worst.  She has at times increased pain from shin down to ankle and dorsal foot.  With increased weakness in the left foot and ankle. Upon assessment patient appears to have decreased strength in left hip, knee and ankle.  With increased stiffness. Patient lives in a two-story house with her bedroom upstairs but not being doing upstairs since her last surgery 12 weeks ago.  Planning to do it now. Tub shower combo-recommend getting a railing for the top edge. Does have an on slippery surface BERG balance test done - score 52/56-low risk for fall But increased weakness in the left lower extremity with neuropathy in bilateral feet. Patient used to  be able to walk 10 times in her cul-de-sac circle - but now only 2 x  Patient is interested in outpatient physical therapy-offered patient's different clinics patient prefer physical sports rehab punches straight Request physical therapy order for evaluate and treat for left lower extremity weakness as well as bilateral neuropathy.                            Visit Diagnosis: Neuropathy  Muscle weakness (generalized)    Problem List Patient Active Problem List   Diagnosis Date Noted   Hypokalemia 04/01/2022   Endometrial cancer (HCC) 01/15/2022   Iron deficiency anemia 01/15/2022   Morbid obesity  with BMI of 60.0-69.9, adult (HCC) 04/18/2015   Abnormal uterine bleeding 04/18/2015   Anemia 04/18/2015   Pulmonary embolism (HCC) 03/11/2015    Oletta Cohn, OTR/L,CLT 08/27/2022, 11:09 AM  [facility] [address], [address] [place], [place], [address] Phone: [phone]   Fax:  [phone]  Name: Jean Morris MRN: [id number] Date of Birth: 16-Dec-1975

## 2022-09-02 ENCOUNTER — Other Ambulatory Visit: Payer: Self-pay | Admitting: *Deleted

## 2022-09-02 DIAGNOSIS — C541 Malignant neoplasm of endometrium: Secondary | ICD-10-CM

## 2022-09-03 ENCOUNTER — Ambulatory Visit: Payer: BC Managed Care – PPO

## 2022-09-09 ENCOUNTER — Ambulatory Visit
Admission: RE | Admit: 2022-09-09 | Discharge: 2022-09-09 | Disposition: A | Discharge status: Home / Self Care | Payer: BC Managed Care – PPO | Source: Ambulatory Visit | Attending: Oncology | Admitting: Oncology

## 2022-09-09 DIAGNOSIS — I517 Cardiomegaly: Secondary | ICD-10-CM | POA: Diagnosis not present

## 2022-09-09 DIAGNOSIS — M799 Soft tissue disorder, unspecified: Secondary | ICD-10-CM | POA: Diagnosis not present

## 2022-09-09 DIAGNOSIS — Z90710 Acquired absence of both cervix and uterus: Secondary | ICD-10-CM | POA: Insufficient documentation

## 2022-09-09 DIAGNOSIS — C541 Malignant neoplasm of endometrium: Secondary | ICD-10-CM | POA: Diagnosis not present

## 2022-09-09 DIAGNOSIS — R590 Localized enlarged lymph nodes: Secondary | ICD-10-CM | POA: Diagnosis not present

## 2022-09-09 LAB — GLUCOSE, CAPILLARY: Glucose-Capillary: 80 mg/dL (ref 70–99)

## 2022-09-09 MED ORDER — FLUDEOXYGLUCOSE F - 18 (FDG) INJECTION
12.0000 | Freq: Once | INTRAVENOUS | Status: AC | PRN
  Administered 2022-09-09: 13.09 via INTRAVENOUS

## 2022-09-15 ENCOUNTER — Other Ambulatory Visit: Payer: Self-pay | Admitting: Nurse Practitioner

## 2022-09-15 DIAGNOSIS — E876 Hypokalemia: Secondary | ICD-10-CM

## 2022-09-17 ENCOUNTER — Encounter: Payer: Self-pay | Admitting: Nurse Practitioner

## 2022-09-17 ENCOUNTER — Encounter: Payer: Self-pay | Admitting: Oncology

## 2022-09-26 ENCOUNTER — Encounter: Payer: Self-pay | Admitting: Oncology

## 2022-09-30 MED FILL — Dexamethasone Sodium Phosphate Inj 100 MG/10ML: INTRAMUSCULAR | Qty: 1 | Status: AC

## 2022-10-01 ENCOUNTER — Encounter: Payer: Self-pay | Admitting: Oncology

## 2022-10-01 ENCOUNTER — Inpatient Hospital Stay: Payer: BC Managed Care – PPO | Attending: Obstetrics and Gynecology

## 2022-10-01 ENCOUNTER — Inpatient Hospital Stay: Payer: BC Managed Care – PPO

## 2022-10-01 ENCOUNTER — Inpatient Hospital Stay (HOSPITAL_BASED_OUTPATIENT_CLINIC_OR_DEPARTMENT_OTHER): Payer: BC Managed Care – PPO | Admitting: Oncology

## 2022-10-01 ENCOUNTER — Other Ambulatory Visit: Payer: Self-pay | Admitting: *Deleted

## 2022-10-01 VITALS — BP 131/83 | HR 74 | Temp 96.1°F | Resp 18 | Ht 65.0 in | Wt 255.4 lb

## 2022-10-01 DIAGNOSIS — Z5111 Encounter for antineoplastic chemotherapy: Secondary | ICD-10-CM | POA: Insufficient documentation

## 2022-10-01 DIAGNOSIS — C541 Malignant neoplasm of endometrium: Secondary | ICD-10-CM | POA: Diagnosis not present

## 2022-10-01 DIAGNOSIS — Z79899 Other long term (current) drug therapy: Secondary | ICD-10-CM | POA: Diagnosis not present

## 2022-10-01 DIAGNOSIS — I10 Essential (primary) hypertension: Secondary | ICD-10-CM | POA: Insufficient documentation

## 2022-10-01 DIAGNOSIS — Z7901 Long term (current) use of anticoagulants: Secondary | ICD-10-CM | POA: Insufficient documentation

## 2022-10-01 DIAGNOSIS — Z86711 Personal history of pulmonary embolism: Secondary | ICD-10-CM | POA: Insufficient documentation

## 2022-10-01 DIAGNOSIS — G620 Drug-induced polyneuropathy: Secondary | ICD-10-CM | POA: Insufficient documentation

## 2022-10-01 DIAGNOSIS — Z5112 Encounter for antineoplastic immunotherapy: Secondary | ICD-10-CM

## 2022-10-01 DIAGNOSIS — D649 Anemia, unspecified: Secondary | ICD-10-CM | POA: Insufficient documentation

## 2022-10-01 DIAGNOSIS — C771 Secondary and unspecified malignant neoplasm of intrathoracic lymph nodes: Secondary | ICD-10-CM | POA: Insufficient documentation

## 2022-10-01 DIAGNOSIS — Z809 Family history of malignant neoplasm, unspecified: Secondary | ICD-10-CM | POA: Insufficient documentation

## 2022-10-01 DIAGNOSIS — T451X5A Adverse effect of antineoplastic and immunosuppressive drugs, initial encounter: Secondary | ICD-10-CM | POA: Diagnosis not present

## 2022-10-01 DIAGNOSIS — Z791 Long term (current) use of non-steroidal anti-inflammatories (NSAID): Secondary | ICD-10-CM | POA: Insufficient documentation

## 2022-10-01 LAB — CBC WITH DIFFERENTIAL/PLATELET
Abs Immature Granulocytes: 0.01 10*3/uL (ref 0.00–0.07)
Basophils Absolute: 0 10*3/uL (ref 0.0–0.1)
Basophils Relative: 1 %
Eosinophils Absolute: 0.2 10*3/uL (ref 0.0–0.5)
Eosinophils Relative: 4 %
HCT: 30.6 % — ABNORMAL LOW (ref 36.0–46.0)
Hemoglobin: 10.9 g/dL — ABNORMAL LOW (ref 12.0–15.0)
Immature Granulocytes: 0 %
Lymphocytes Relative: 37 %
Lymphs Abs: 1.6 10*3/uL (ref 0.7–4.0)
MCH: 26.5 pg (ref 26.0–34.0)
MCHC: 35.6 g/dL (ref 30.0–36.0)
MCV: 74.5 fL — ABNORMAL LOW (ref 80.0–100.0)
Monocytes Absolute: 0.3 10*3/uL (ref 0.1–1.0)
Monocytes Relative: 7 %
Neutro Abs: 2.2 10*3/uL (ref 1.7–7.7)
Neutrophils Relative %: 51 %
Platelets: 235 10*3/uL (ref 150–400)
RBC: 4.11 MIL/uL (ref 3.87–5.11)
RDW: 15.9 % — ABNORMAL HIGH (ref 11.5–15.5)
WBC: 4.3 10*3/uL (ref 4.0–10.5)
nRBC: 0 % (ref 0.0–0.2)

## 2022-10-01 LAB — COMPREHENSIVE METABOLIC PANEL
ALT: 12 U/L (ref 0–44)
AST: 18 U/L (ref 15–41)
Albumin: 3.6 g/dL (ref 3.5–5.0)
Alkaline Phosphatase: 58 U/L (ref 38–126)
Anion gap: 5 (ref 5–15)
BUN: 17 mg/dL (ref 6–20)
CO2: 26 mmol/L (ref 22–32)
Calcium: 9.1 mg/dL (ref 8.9–10.3)
Chloride: 104 mmol/L (ref 98–111)
Creatinine, Ser: 0.77 mg/dL (ref 0.44–1.00)
GFR, Estimated: >60 mL/min (ref >60)
Glucose, Bld: 89 mg/dL (ref 70–99)
Potassium: 3.4 mmol/L — ABNORMAL LOW (ref 3.5–5.1)
Sodium: 135 mmol/L (ref 135–145)
Total Bilirubin: 0.5 mg/dL (ref 0.3–1.2)
Total Protein: 7.2 g/dL (ref 6.5–8.1)

## 2022-10-01 MED ORDER — PREGABALIN 75 MG PO CAPS: 75.0000 mg | ORAL_CAPSULE | Freq: Two times a day (BID) | ORAL | 1 refills | Status: DC

## 2022-10-01 MED ORDER — SODIUM CHLORIDE 0.9 % IV SOLN
10.0000 mg | Freq: Once | INTRAVENOUS | Status: AC
  Administered 2022-10-01: 10 mg via INTRAVENOUS
  Filled 2022-10-01: qty 10

## 2022-10-01 MED ORDER — SODIUM CHLORIDE 0.9 % IV SOLN
Freq: Once | INTRAVENOUS | Status: AC
  Filled 2022-10-01: qty 250

## 2022-10-01 MED ORDER — SODIUM CHLORIDE 0.9 % IV SOLN
400.0000 mg | Freq: Once | INTRAVENOUS | Status: AC
  Administered 2022-10-01: 400 mg via INTRAVENOUS
  Filled 2022-10-01: qty 16

## 2022-10-01 MED ORDER — HEPARIN SOD (PORK) LOCK FLUSH 100 UNIT/ML IV SOLN
500.0000 [IU] | Freq: Once | INTRAVENOUS | Status: AC | PRN
  Administered 2022-10-01: 500 [IU]
  Filled 2022-10-01: qty 5

## 2022-10-01 NOTE — Patient Instructions (Signed)
[facility]  Discharge Instructions: Thank you for choosing [facility] to provide your oncology and hematology care.  If you have a lab appointment with the Cancer Center, please go directly to the Cancer Center and check in at the registration area.  Wear comfortable clothing and clothing appropriate for easy access to any Portacath or PICC line.   We strive to give you quality time with your provider. You may need to reschedule your appointment if you arrive late (15 or more minutes).  Arriving late affects you and other patients whose appointments are after yours.  Also, if you miss three or more appointments without notifying the office, you may be dismissed from the clinic at the provider's discretion.      For prescription refill requests, have your pharmacy contact our office and allow 72 hours for refills to be completed.    Today you received the following chemotherapy and/or immunotherapy agents- Keytruda      To help prevent nausea and vomiting after your treatment, we encourage you to take your nausea medication as directed.  BELOW ARE SYMPTOMS THAT SHOULD BE REPORTED IMMEDIATELY: *FEVER GREATER THAN 100.4 F (38 C) OR HIGHER *CHILLS OR SWEATING *NAUSEA AND VOMITING THAT IS NOT CONTROLLED WITH YOUR NAUSEA MEDICATION *UNUSUAL SHORTNESS OF BREATH *UNUSUAL BRUISING OR BLEEDING *URINARY PROBLEMS (pain or burning when urinating, or frequent urination) *BOWEL PROBLEMS (unusual diarrhea, constipation, pain near the anus) TENDERNESS IN MOUTH AND THROAT WITH OR WITHOUT PRESENCE OF ULCERS (sore throat, sores in mouth, or a toothache) UNUSUAL RASH, SWELLING OR PAIN  UNUSUAL VAGINAL DISCHARGE OR ITCHING   Items with * indicate a potential emergency and should be followed up as soon as possible or go to the Emergency Department if any problems should occur.  Please show the CHEMOTHERAPY ALERT CARD or IMMUNOTHERAPY ALERT CARD at check-in to  the Emergency Department and triage nurse.  Should you have questions after your visit or need to cancel or reschedule your appointment, please contact [facility]  [phone] and follow the prompts.  Office hours are 8:00 a.m. to 4:30 p.m. Monday - Friday. Please note that voicemails left after 4:00 p.m. may not be returned until the following business day.  We are closed weekends and major holidays. You have access to a nurse at all times for urgent questions. Please call the main number to the clinic [phone] and follow the prompts.  For any non-urgent questions, you may also contact your provider using MyChart. We now offer e-Visits for anyone 18 and older to request care online for non-urgent symptoms. For details visit [email or web address].   Also download the MyChart app! Go to the app store, search "MyChart", open the app, select [facility], and log in with your MyChart username and password.   

## 2022-10-01 NOTE — Progress Notes (Signed)
I was asked to review the PET scan and provide treatment recommendations. Given the findings below it is reasonable to complete pembrolizumab maintenance and reimage. Overall response to therapy is reassuring. The aortacaval node measures 8 mm and a S.U.V. max of 3.8 on 82/4 versus 7 mm and a S.U.V. max of 3.5 on the prior exam. While this node is very small the SUV is concerning. If still positive on subsequent PET consider radiation therapy and then reassess. She is scheduled to follow up with Gyn Onc in November 2024. She is seeing Dr. Smith Robert today.   Leta Jungling, MD    CLINICAL DATA:  Subsequent treatment strategy for endometrial cancer, status post hysterectomy. On immunotherapy. Chemotherapy finishing in March.   EXAM: NUCLEAR MEDICINE PET SKULL BASE TO THIGH   TECHNIQUE: 13.1 mCi F-18 FDG was injected intravenously. Full-ring PET imaging was performed from the skull base to thigh after the radiotracer. CT data was obtained and used for attenuation correction and anatomic localization.   Fasting blood glucose: 80 mg/dl   COMPARISON:  27/25/3664   FINDINGS: Mild limitations secondary to patient body habitus   Mediastinal blood pool activity: SUV max 2.2   Liver activity: SUV max NA   NECK: The hypermetabolic left level 2 node is no longer identified. A right-sided level 2 node measures 7 mm and a S.U.V. max of 3.8 today versus similar in size and a S.U.V. max of 3.6 on the prior exam (when remeasured).   Incidental CT findings: No cervical adenopathy.   CHEST: No residual axillary nodal hypermetabolism. The index left axillary node is decreased in size at 6 mm on 50/4 versus 8 mm previously. No pulmonary parenchymal hypermetabolism.   Incidental CT findings: Right Port-A-Cath tip at superior caval/atrial junction. Mild cardiomegaly.   ABDOMEN/PELVIS: Portacaval node measures 8 mm and a S.U.V. max of 3.8 on 82/4 versus 7 mm and a S.U.V. max of 3.5 on the prior  exam (when remeasured).   The left periaortic amorphous soft tissue density measures 2.0 x 1.4 cm and a S.U.V. max of 2.5 on 86/4. Compare 2.9 x 1.6 cm and a S.U.V. max of 3.1 on the prior exam. The adjacent discrete hypermetabolic retroperitoneal lymph node is no longer identified.   Left inguinal hypermetabolic node is no longer identified. There is a right inguinal node which measures 1.4 cm and a S.U.V. max of 4.0 on 153/4 versus 1.1 cm and a S.U.V. max of 2.0 previously.   Incidental CT findings: Interval hysterectomy. Normal adrenal glands.   SKELETON: No abnormal marrow activity.   Incidental CT findings: None.   IMPRESSION: 1. Interval hysterectomy. 2. Response to therapy of abdominal retroperitoneal hypermetabolic nodes. A right inguinal node is mildly enlarged and demonstrates slight increase in hypermetabolism, indeterminate for reactive versus metastatic etiologies. 3. Small portacaval and right cervical nodes with low-level hypermetabolism, favored to be reactive. Hypermetabolic bilateral axillary nodes have resolved. 4. Mild limitations secondary to patient body habitus.

## 2022-10-01 NOTE — Progress Notes (Signed)
I was asked to review the PET scan and provide treatment recommendations. Given the findings below it is reasonable to complete pembrolizumab maintenance and reimage. Overall response to therapy is reassuring. The aortacaval node measures 8 mm and a S.U.V. max of 3.8 on 82/4 versus 7 mm and a S.U.V. max of 3.5 on the prior exam. While this node is very small the SUV is concerning. If still positive on subsequent PET consider radiation therapy and then reassess. She is scheduled to follow up with Gyn Onc in November 2024. She is seeing Dr. Smith Robert today.   Jean Jungling, MD       CLINICAL DATA:  Subsequent treatment strategy for endometrial cancer, status post hysterectomy. On immunotherapy. Chemotherapy finishing in March.   EXAM: NUCLEAR MEDICINE PET SKULL BASE TO THIGH   TECHNIQUE: 13.1 mCi F-18 FDG was injected intravenously. Full-ring PET imaging was performed from the skull base to thigh after the radiotracer. CT data was obtained and used for attenuation correction and anatomic localization.   Fasting blood glucose: 80 mg/dl   COMPARISON:  63/87/5643   FINDINGS: Mild limitations secondary to patient body habitus   Mediastinal blood pool activity: SUV max 2.2   Liver activity: SUV max NA   NECK: The hypermetabolic left level 2 node is no longer identified. A right-sided level 2 node measures 7 mm and a S.U.V. max of 3.8 today versus similar in size and a S.U.V. max of 3.6 on the prior exam (when remeasured).   Incidental CT findings: No cervical adenopathy.   CHEST: No residual axillary nodal hypermetabolism. The index left axillary node is decreased in size at 6 mm on 50/4 versus 8 mm previously. No pulmonary parenchymal hypermetabolism.   Incidental CT findings: Right Port-A-Cath tip at superior caval/atrial junction. Mild cardiomegaly.   ABDOMEN/PELVIS: Portacaval node measures 8 mm and a S.U.V. max of 3.8 on 82/4 versus 7 mm and a S.U.V. max of 3.5 on the  prior exam (when remeasured).   The left periaortic amorphous soft tissue density measures 2.0 x 1.4 cm and a S.U.V. max of 2.5 on 86/4. Compare 2.9 x 1.6 cm and a S.U.V. max of 3.1 on the prior exam. The adjacent discrete hypermetabolic retroperitoneal lymph node is no longer identified.   Left inguinal hypermetabolic node is no longer identified. There is a right inguinal node which measures 1.4 cm and a S.U.V. max of 4.0 on 153/4 versus 1.1 cm and a S.U.V. max of 2.0 previously.   Incidental CT findings: Interval hysterectomy. Normal adrenal glands.   SKELETON: No abnormal marrow activity.   Incidental CT findings: None.   IMPRESSION: 1. Interval hysterectomy. 2. Response to therapy of abdominal retroperitoneal hypermetabolic nodes. A right inguinal node is mildly enlarged and demonstrates slight increase in hypermetabolism, indeterminate for reactive versus metastatic etiologies. 3. Small portacaval and right cervical nodes with low-level hypermetabolism, favored to be reactive. Hypermetabolic bilateral axillary nodes have resolved. 4. Mild limitations secondary to patient body habitus.            Electronically signed by Artelia Laroche, MD at 10/01/2022 11:59 AM

## 2022-10-04 ENCOUNTER — Encounter: Payer: Self-pay | Admitting: Oncology

## 2022-10-04 NOTE — Progress Notes (Signed)
Hematology/Oncology Consult note [facility]  Telephone:([phone] [phone]  Patient Care Team: Center, [facility] as PCP - General (General Practice) [name], RN as Oncology Nurse Navigator [name], MD as Medical Oncologist (Oncology)   Name of the patient: Jean Morris  [id number]  [birth year]   Date of visit: 10/04/22  Diagnosis-stage IVb endometrioid endometrial cancer  Chief complaint/ Reason for visit-on treatment assessment prior to cycle 6 of palliative Keytruda  Heme/Onc history: Patient is a 47 year old female who has seen Dr. Logan Bores for irregular menstrual bleeding.  He has had an IUD in place and initially it was attribute it to use of IUD and was tried to be controlled with progesterone agents.  However due to worsening abdominal pain patient underwent a CT abdomen and pelvis with contrast on 12/25/2021 which showed 8 mm   Posterior lung base mass.  Retroperitoneal adenopathy measuring 2.6 cm additional retrocrural adenopathy.  Faint lucent lesion involving L2 vertebral body.  The uterus is anteverted and enlarged.  Endometrium is enlarged heterogeneous and irregular extending to the fundal myometrium.  Findings consistent with endometrial malignancy.  CT chest without contrast showed a 10 mm right lower lobe subpleural nodule.   Patient had endometrial biopsy which was consistent FIGO grade 2 endometrioid endometrial carcinoma.  Patient was seen by GYN oncology and hopes to take her for surgery if she has good response to neoadjuvant chemotherapy.MSI unstable.  BRAF negative.  Loss of major and minor MMR proteins MLH1 and PMS2 less than 5% of tumor expression.  MLH1 hyper methylation present.  This is most likely due to somatic epigenetic modification which can be seen in about 77% of sporadic endometrial cancers.   Patient completed 6 cycles of CarboTaxol Keytruda chemotherapy and is currently on  maintenance Keytruda.  PET CT scan on 05/19/2022 showed enlarged uterus with hypermetabolic endometrial thickening.  Improving left periaortic nodal metastases.  No definitive bone metastases was noted.  Plan is to proceed with cytoreductive surgery at [facility].  Patient underwent hysterectomy and bilateral salpingo-oophorectomy on 06/10/2022.  Final pathology showed residual endometrioid adenocarcinoma of the endometrium FIGO grade 2 with marked treatment effect invading 18 mm and a 34 mm thick myometrium.  Fallopian tubes and ovaries were negative for malignancy.  Pelvic washings were negative for malignancy.   Patient is currently on maintenance Keytruda  Interval history-tolerating Keytruda well without any significant side effects.  She has occasional pain from her neuropathy in her feet especially at night.  She has tried gabapentin but she could not tolerate the side effects.  Currently on as needed oxycodone.  ECOG PS- 1 Pain scale- 0   Review of systems- Review of Systems  Constitutional:  Negative for chills, fever, malaise/fatigue and weight loss.  HENT:  Negative for congestion, ear discharge and nosebleeds.   Eyes:  Negative for blurred vision.  Respiratory:  Negative for cough, hemoptysis, sputum production, shortness of breath and wheezing.   Cardiovascular:  Negative for chest pain, palpitations, orthopnea and claudication.  Gastrointestinal:  Negative for abdominal pain, blood in stool, constipation, diarrhea, heartburn, melena, nausea and vomiting.  Genitourinary:  Negative for dysuria, flank pain, frequency, hematuria and urgency.  Musculoskeletal:  Negative for back pain, joint pain and myalgias.  Skin:  Negative for rash.  Neurological:  Positive for sensory change (Peripheral neuropathy). Negative for dizziness, tingling, focal weakness, seizures, weakness and headaches.  Endo/Heme/Allergies:  Does not bruise/bleed easily.  Psychiatric/Behavioral:  Negative for depression  and  suicidal ideas. The patient does not have insomnia.       Allergies  Allergen Reactions   Bee Pollen Nausea And Vomiting    Itchy, swelling, watery eyes, runny nose.   Pollen Extract Nausea And Vomiting    Itchy, swelling, watery eyes, runny nose.     Past Medical History:  Diagnosis Date   Anemia    Endometrial cancer (HCC)    Hypertension    Menorrhagia    Pulmonary embolism [facility])      Past Surgical History:  Procedure Laterality Date   ABDOMINAL HYSTERECTOMY Bilateral 06/10/2022   at [facility]   CESAREAN SECTION  02/25/1992   COLONOSCOPY WITH PROPOFOL N/A 08/06/2021   Procedure: COLONOSCOPY WITH PROPOFOL;  Surgeon: Wyline Mood, MD;  Location: [facility] ENDOSCOPY;  Service: Gastroenterology;  Laterality: N/A;   ESOPHAGOGASTRODUODENOSCOPY (EGD) WITH PROPOFOL N/A 02/09/2020   Procedure: ESOPHAGOGASTRODUODENOSCOPY (EGD) WITH PROPOFOL;  Surgeon: Wyline Mood, MD;  Location: [facility] ENDOSCOPY;  Service: Gastroenterology;  Laterality: N/A;   IR IMAGING GUIDED PORT INSERTION  01/17/2022    Social History   Socioeconomic History   Marital status: Media planner    Spouse name: Not on file   Number of children: Not on file   Years of education: Not on file   Highest education level: Not on file  Occupational History   Not on file  Tobacco Use   Smoking status: Never   Smokeless tobacco: Never  Vaping Use   Vaping status: Never Used  Substance and Sexual Activity   Alcohol use: No   Drug use: No   Sexual activity: Not Currently    Birth control/protection: I.U.D.    Comment: patient states MD said did not see in CT today 01/17/2022  Other Topics Concern   Not on file  Social History Narrative   Lives with [facility], partner, and no pets.   Social Determinants of Health   Financial Resource Strain: Not on file  Food Insecurity: Not on file  Transportation Needs: Not on file  Physical Activity: Not on file  Stress: Not on file  Social Connections: Not on file  Intimate  Partner Violence: Not on file    Family History  Problem Relation Age of Onset   Hypertension Mother    Stroke Mother    Heart attack Mother    Cancer Father    Hypertension Father    Cancer Sister    Cancer Sister    Cancer Brother    Hypertension Other      Current Outpatient Medications:    apixaban (ELIQUIS) 2.5 MG TABS tablet, Take 2.5 mg by mouth 2 (two) times daily., Disp: , Rfl:    fluconazole (DIFLUCAN) 150 MG tablet, Take 1 tablet (150 mg total) by mouth daily. Take 1 tablet (150 mg) by mouth. Three days later, take second tablet., Disp: 2 tablet, Rfl: 0   KLOR-CON M20 20 MEQ tablet, TAKE 1 TABLET (20 MEQ TOTAL) BY MOUTH 2 (TWO) TIMES DAILY. DISSOLVE TABLETS, Disp: 60 tablet, Rfl: 2   levonorgestrel (MIRENA) 20 MCG/24HR IUD, 1 Intra Uterine Device (1 each total) by Intrauterine route once., Disp: 1 each, Rfl: 0   lidocaine-prilocaine (EMLA) cream, Apply to port site as directed, Disp: 30 g, Rfl: 3   LORazepam (ATIVAN) 0.5 MG tablet, Take 1 tablet (0.5 mg total) by mouth every 8 (eight) hours., Disp: 30 tablet, Rfl: 0   magnesium hydroxide (MILK OF MAGNESIA) 400 MG/5ML suspension, Take 15 mLs by mouth daily as needed for  moderate constipation. Take if no bowel movement for 2 days., Disp: , Rfl:    methocarbamol (ROBAXIN) 500 MG tablet, Take 1 tablet (500 mg total) by mouth 2 (two) times daily as needed for muscle spasms., Disp: 30 tablet, Rfl: 0   nystatin (MYCOSTATIN/NYSTOP) powder, Apply 1 Application topically 3 (three) times daily., Disp: 30 g, Rfl: 3   oxyCODONE (OXY IR/ROXICODONE) 5 MG immediate release tablet, Take 1 tablet (5 mg total) by mouth every 4 (four) hours as needed for severe pain., Disp: 120 tablet, Rfl: 0   pantoprazole (PROTONIX) 40 MG tablet, Take 1 tablet (40 mg total) by mouth daily., Disp: 30 tablet, Rfl: 2   polyethylene glycol (MIRALAX) 17 g packet, Take 17 g by mouth daily. To prevent constipation, Disp: 30 each, Rfl: 3   prochlorperazine (COMPAZINE)  10 MG tablet, Take 1 tablet (10 mg total) by mouth every 6 (six) hours as needed for nausea or vomiting., Disp: 30 tablet, Rfl: 1   senna (SENOKOT) 8.6 MG TABS tablet, Take 1-2 tablets (8.6-17.2 mg total) by mouth daily. To prevent constipation, Disp: 120 tablet, Rfl: 0   benzonatate (TESSALON) 100 MG capsule, Take 1 capsule (100 mg total) by mouth 3 (three) times daily as needed for cough. (Patient not taking: Reported on 08/20/2022), Disp: 20 capsule, Rfl: 0   pregabalin (LYRICA) 75 MG capsule, Take 1 capsule (75 mg total) by mouth 2 (two) times daily., Disp: 60 capsule, Rfl: 1  Physical exam:  Vitals:   10/01/22 0858  BP: 131/83  Pulse: 74  Resp: 18  Temp: (!) 96.1 F (35.6 C)  TempSrc: Tympanic  SpO2: 100%  Weight: 255 lb 6.4 oz (115.8 kg)  Height: 5\' 5"  (1.651 m)   Physical Exam Cardiovascular:     Rate and Rhythm: Normal rate and regular rhythm.     Heart sounds: Normal heart sounds.  Pulmonary:     Effort: Pulmonary effort is normal.     Breath sounds: Normal breath sounds.  Abdominal:     General: Bowel sounds are normal.     Palpations: Abdomen is soft.  Skin:    General: Skin is warm and dry.  Neurological:     Mental Status: She is alert and oriented to person, place, and time.         Latest Ref Rng & Units 10/01/2022    8:48 AM  CMP  Glucose 70 - 99 mg/dL 89   BUN 6 - 20 mg/dL 17   Creatinine 6.38 - 1.00 mg/dL 7.56   Sodium 433 - 295 mmol/L 135   Potassium 3.5 - 5.1 mmol/L 3.4   Chloride 98 - 111 mmol/L 104   CO2 22 - 32 mmol/L 26   Calcium 8.9 - 10.3 mg/dL 9.1   Total Protein 6.5 - 8.1 g/dL 7.2   Total Bilirubin 0.3 - 1.2 mg/dL 0.5   Alkaline Phos 38 - 126 U/L 58   AST 15 - 41 U/L 18   ALT 0 - 44 U/L 12       Latest Ref Rng & Units 10/01/2022    8:48 AM  CBC  WBC 4.0 - 10.5 K/uL 4.3   Hemoglobin 12.0 - 15.0 g/dL 18.8   Hematocrit 41.6 - 46.0 % 30.6   Platelets 150 - 400 K/uL 235     No images are attached to the encounter.  NM PET Image Restag  (PS) Skull Base To Thigh  Result Date: 09/12/2022 CLINICAL DATA:  Subsequent treatment strategy for endometrial  cancer, status post hysterectomy. On immunotherapy. Chemotherapy finishing in March. EXAM: NUCLEAR MEDICINE PET SKULL BASE TO THIGH TECHNIQUE: 13.1 mCi F-18 FDG was injected intravenously. Full-ring PET imaging was performed from the skull base to thigh after the radiotracer. CT data was obtained and used for attenuation correction and anatomic localization. Fasting blood glucose: 80 mg/dl COMPARISON:  72/53/6644 FINDINGS: Mild limitations secondary to patient body habitus Mediastinal blood pool activity: SUV max 2.2 Liver activity: SUV max NA NECK: The hypermetabolic left level 2 node is no longer identified. A right-sided level 2 node measures 7 mm and a S.U.V. max of 3.8 today versus similar in size and a S.U.V. max of 3.6 on the prior exam (when remeasured). Incidental CT findings: No cervical adenopathy. CHEST: No residual axillary nodal hypermetabolism. The index left axillary node is decreased in size at 6 mm on 50/4 versus 8 mm previously. No pulmonary parenchymal hypermetabolism. Incidental CT findings: Right Port-A-Cath tip at superior caval/atrial junction. Mild cardiomegaly. ABDOMEN/PELVIS: Portacaval node measures 8 mm and a S.U.V. max of 3.8 on 82/4 versus 7 mm and a S.U.V. max of 3.5 on the prior exam (when remeasured). The left periaortic amorphous soft tissue density measures 2.0 x 1.4 cm and a S.U.V. max of 2.5 on 86/4. Compare 2.9 x 1.6 cm and a S.U.V. max of 3.1 on the prior exam. The adjacent discrete hypermetabolic retroperitoneal lymph node is no longer identified. Left inguinal hypermetabolic node is no longer identified. There is a right inguinal node which measures 1.4 cm and a S.U.V. max of 4.0 on 153/4 versus 1.1 cm and a S.U.V. max of 2.0 previously. Incidental CT findings: Interval hysterectomy. Normal adrenal glands. SKELETON: No abnormal marrow activity. Incidental CT  findings: None. IMPRESSION: 1. Interval hysterectomy. 2. Response to therapy of abdominal retroperitoneal hypermetabolic nodes. A right inguinal node is mildly enlarged and demonstrates slight increase in hypermetabolism, indeterminate for reactive versus metastatic etiologies. 3. Small portacaval and right cervical nodes with low-level hypermetabolism, favored to be reactive. Hypermetabolic bilateral axillary nodes have resolved. 4. Mild limitations secondary to patient body habitus. Electronically Signed   By: Jeronimo Greaves M.D.   On: 09/12/2022 16:12     Assessment and plan- Patient is a 47 y.o. female  with stage IV endometrioid endometrial carcinoma T2 N2 M1.  She is here for on treatment assessment prior to cycle 6 of palliative Keytruda  Counts okay to proceed with cycle 6 of palliative Keytruda today.  I will see her back in 3 weeks for cycle 7.  I have reviewedPET CT scan images independently and discussed findings with the patient which overall shows excellent response to treatment so far.  There is some minimal SUV uptake in the periaortic soft tissue mass which currently measures 2 cm with an SUV of 2.5 and plan is to complete maintenance Keytruda as planned.  We will could consider getting a repeat PET scan in about 4 to 5 months and if there is still residual uptake consideration for radiation at that time.  Overall patient is tolerating Keytruda well without any significant side effects.  Chemo-induced peripheral neuropathy: I would like her to try Lyrica for her neuropathy instead of relying on opioids for long-term.  We will send her a prescription for the same   Visit Diagnosis 1. Endometrial cancer (HCC)   2. Encounter for antineoplastic immunotherapy   3. Chemotherapy-induced peripheral neuropathy (HCC)      Dr. Owens Shark, MD, MPH [facility] [phone] 10/04/2022  7:45 AM

## 2022-10-04 NOTE — Addendum Note (Signed)
Addended by: Owens Shark C on: 10/04/2022 07:51 AM   Modules accepted: Level of Service

## 2022-10-05 ENCOUNTER — Emergency Department
Admission: EM | Admit: 2022-10-05 | Discharge: 2022-10-05 | Disposition: A | Discharge status: Home / Self Care | Payer: BC Managed Care – PPO | Attending: Emergency Medicine | Admitting: Emergency Medicine

## 2022-10-05 ENCOUNTER — Encounter: Payer: Self-pay | Admitting: Emergency Medicine

## 2022-10-05 ENCOUNTER — Other Ambulatory Visit: Payer: Self-pay

## 2022-10-05 ENCOUNTER — Emergency Department: Payer: BC Managed Care – PPO

## 2022-10-05 DIAGNOSIS — Y92410 Unspecified street and highway as the place of occurrence of the external cause: Secondary | ICD-10-CM | POA: Diagnosis not present

## 2022-10-05 DIAGNOSIS — M25512 Pain in left shoulder: Secondary | ICD-10-CM | POA: Insufficient documentation

## 2022-10-05 DIAGNOSIS — M19012 Primary osteoarthritis, left shoulder: Secondary | ICD-10-CM | POA: Diagnosis not present

## 2022-10-05 DIAGNOSIS — S4992XA Unspecified injury of left shoulder and upper arm, initial encounter: Secondary | ICD-10-CM | POA: Diagnosis not present

## 2022-10-05 DIAGNOSIS — M25572 Pain in left ankle and joints of left foot: Secondary | ICD-10-CM | POA: Insufficient documentation

## 2022-10-05 DIAGNOSIS — Z041 Encounter for examination and observation following transport accident: Secondary | ICD-10-CM | POA: Diagnosis not present

## 2022-10-05 MED ORDER — DICLOFENAC SODIUM 1 % EX GEL: 4.0000 g | Freq: Four times a day (QID) | CUTANEOUS | 0 refills | Status: DC

## 2022-10-05 MED ORDER — LIDOCAINE 5 % EX PTCH
2.0000 | MEDICATED_PATCH | CUTANEOUS | Status: DC
  Administered 2022-10-05: 2 via TRANSDERMAL
  Filled 2022-10-05: qty 2
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
## 2022-10-06 ENCOUNTER — Telehealth: Payer: Self-pay
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
## 2022-10-06 NOTE — Telephone Encounter (Signed)
Can you look into this? Only thing I can think of is to get a CXR

## 2022-10-08 ENCOUNTER — Inpatient Hospital Stay (HOSPITAL_BASED_OUTPATIENT_CLINIC_OR_DEPARTMENT_OTHER): Payer: BC Managed Care – PPO | Admitting: Medical Oncology

## 2022-10-08 ENCOUNTER — Encounter: Payer: Self-pay | Admitting: Medical Oncology

## 2022-10-08 VITALS — BP 114/75 | HR 92 | Temp 96.7°F | Wt 255.0 lb

## 2022-10-08 DIAGNOSIS — Z95828 Presence of other vascular implants and grafts: Secondary | ICD-10-CM | POA: Diagnosis not present

## 2022-10-08 DIAGNOSIS — Z809 Family history of malignant neoplasm, unspecified: Secondary | ICD-10-CM | POA: Diagnosis not present

## 2022-10-08 DIAGNOSIS — Z86711 Personal history of pulmonary embolism: Secondary | ICD-10-CM | POA: Diagnosis not present

## 2022-10-08 DIAGNOSIS — C771 Secondary and unspecified malignant neoplasm of intrathoracic lymph nodes: Secondary | ICD-10-CM | POA: Diagnosis not present

## 2022-10-08 DIAGNOSIS — Z79899 Other long term (current) drug therapy: Secondary | ICD-10-CM | POA: Diagnosis not present

## 2022-10-08 DIAGNOSIS — Z5111 Encounter for antineoplastic chemotherapy: Secondary | ICD-10-CM | POA: Diagnosis not present

## 2022-10-08 DIAGNOSIS — C541 Malignant neoplasm of endometrium: Secondary | ICD-10-CM

## 2022-10-08 DIAGNOSIS — Z7901 Long term (current) use of anticoagulants: Secondary | ICD-10-CM | POA: Diagnosis not present

## 2022-10-08 DIAGNOSIS — I10 Essential (primary) hypertension: Secondary | ICD-10-CM | POA: Diagnosis not present

## 2022-10-08 DIAGNOSIS — Z791 Long term (current) use of non-steroidal anti-inflammatories (NSAID): Secondary | ICD-10-CM | POA: Diagnosis not present

## 2022-10-08 DIAGNOSIS — D649 Anemia, unspecified: Secondary | ICD-10-CM | POA: Diagnosis not present

## 2022-10-08 DIAGNOSIS — G620 Drug-induced polyneuropathy: Secondary | ICD-10-CM | POA: Diagnosis not present

## 2022-10-08 MED ORDER — NYSTATIN 100000 UNIT/GM EX POWD: 1.0000 | Freq: Three times a day (TID) | CUTANEOUS | 3 refills | Status: DC

## 2022-10-08 MED ORDER — HEPARIN SOD (PORK) LOCK FLUSH 100 UNIT/ML IV SOLN
500.0000 [IU] | Freq: Once | INTRAVENOUS | Status: AC
  Administered 2022-10-08: 500 [IU] via INTRAVENOUS
  Filled 2022-10-08: qty 5

## 2022-10-08 MED ORDER — SODIUM CHLORIDE 0.9% FLUSH
10.0000 mL | Freq: Once | INTRAVENOUS | Status: AC
  Administered 2022-10-08: 10 mL via INTRAVENOUS
  Filled 2022-10-08: qty 10
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
## 2022-10-08 NOTE — Addendum Note (Signed)
Addended by: [name] on: 10/08/2022 10:28 AM   Modules accepted: Orders

## 2022-10-13 ENCOUNTER — Encounter: Payer: Self-pay | Admitting: Oncology

## 2022-10-16 ENCOUNTER — Encounter: Payer: Self-pay | Admitting: Oncology

## 2022-10-21 NOTE — Telephone Encounter (Signed)
Can you please look into this?

## 2022-10-22 ENCOUNTER — Encounter: Payer: Self-pay | Admitting: Oncology

## 2022-10-26 NOTE — Telephone Encounter (Signed)
Please cal her on Tuesday 10/28/22 and see if she wants to come for UA/ urine cx

## 2022-10-28 ENCOUNTER — Inpatient Hospital Stay: Payer: BC Managed Care – PPO | Attending: Obstetrics and Gynecology

## 2022-10-28 ENCOUNTER — Other Ambulatory Visit: Payer: Self-pay | Admitting: *Deleted

## 2022-10-28 ENCOUNTER — Encounter: Payer: Self-pay | Admitting: *Deleted

## 2022-10-28 ENCOUNTER — Other Ambulatory Visit: Payer: Self-pay

## 2022-10-28 DIAGNOSIS — Z90710 Acquired absence of both cervix and uterus: Secondary | ICD-10-CM | POA: Diagnosis not present

## 2022-10-28 DIAGNOSIS — Z975 Presence of (intrauterine) contraceptive device: Secondary | ICD-10-CM | POA: Diagnosis not present

## 2022-10-28 DIAGNOSIS — I10 Essential (primary) hypertension: Secondary | ICD-10-CM | POA: Diagnosis not present

## 2022-10-28 DIAGNOSIS — C541 Malignant neoplasm of endometrium: Secondary | ICD-10-CM | POA: Diagnosis not present

## 2022-10-28 DIAGNOSIS — Z7901 Long term (current) use of anticoagulants: Secondary | ICD-10-CM | POA: Diagnosis not present

## 2022-10-28 DIAGNOSIS — Z791 Long term (current) use of non-steroidal anti-inflammatories (NSAID): Secondary | ICD-10-CM | POA: Diagnosis not present

## 2022-10-28 DIAGNOSIS — G629 Polyneuropathy, unspecified: Secondary | ICD-10-CM | POA: Diagnosis not present

## 2022-10-28 DIAGNOSIS — R300 Dysuria: Secondary | ICD-10-CM

## 2022-10-28 DIAGNOSIS — Z5112 Encounter for antineoplastic immunotherapy: Secondary | ICD-10-CM | POA: Insufficient documentation

## 2022-10-28 DIAGNOSIS — Z809 Family history of malignant neoplasm, unspecified: Secondary | ICD-10-CM | POA: Diagnosis not present

## 2022-10-28 DIAGNOSIS — M899 Disorder of bone, unspecified: Secondary | ICD-10-CM | POA: Diagnosis not present

## 2022-10-28 DIAGNOSIS — G620 Drug-induced polyneuropathy: Secondary | ICD-10-CM | POA: Diagnosis not present

## 2022-10-28 DIAGNOSIS — Z86711 Personal history of pulmonary embolism: Secondary | ICD-10-CM | POA: Insufficient documentation

## 2022-10-28 DIAGNOSIS — C772 Secondary and unspecified malignant neoplasm of intra-abdominal lymph nodes: Secondary | ICD-10-CM | POA: Insufficient documentation

## 2022-10-28 DIAGNOSIS — Z90722 Acquired absence of ovaries, bilateral: Secondary | ICD-10-CM | POA: Diagnosis not present

## 2022-10-28 DIAGNOSIS — R109 Unspecified abdominal pain: Secondary | ICD-10-CM | POA: Diagnosis not present

## 2022-10-28 DIAGNOSIS — R918 Other nonspecific abnormal finding of lung field: Secondary | ICD-10-CM | POA: Diagnosis not present

## 2022-10-28 DIAGNOSIS — Z79899 Other long term (current) drug therapy: Secondary | ICD-10-CM | POA: Diagnosis not present

## 2022-10-28 DIAGNOSIS — D649 Anemia, unspecified: Secondary | ICD-10-CM | POA: Insufficient documentation

## 2022-10-28 LAB — URINALYSIS, COMPLETE (UACMP) WITH MICROSCOPIC
Bilirubin Urine: NEGATIVE
Glucose, UA: NEGATIVE mg/dL
Hgb urine dipstick: NEGATIVE
Ketones, ur: NEGATIVE mg/dL
Nitrite: NEGATIVE
Protein, ur: NEGATIVE mg/dL
Specific Gravity, Urine: 1.015 (ref 1.005–1.030)
pH: 7 (ref 5.0–8.0)

## 2022-10-28 NOTE — Progress Notes (Signed)
urine

## 2022-10-29 LAB — URINE CULTURE: Culture: 40000 — AB

## 2022-11-05 ENCOUNTER — Other Ambulatory Visit: Payer: Self-pay

## 2022-11-11 ENCOUNTER — Telehealth: Payer: Self-pay | Admitting: *Deleted

## 2022-11-11 MED FILL — Dexamethasone Sodium Phosphate Inj 100 MG/10ML: INTRAMUSCULAR | Qty: 1 | Status: AC

## 2022-11-11 NOTE — Telephone Encounter (Signed)
Called the patient to make sure she has the 2 meds that she needed. She was telling me that every night she has severe lower left abd. She is seeing rao in the am tom. And I sent her a secure chat to let her know

## 2022-11-12 ENCOUNTER — Encounter: Payer: Self-pay | Admitting: Oncology

## 2022-11-12 ENCOUNTER — Other Ambulatory Visit: Payer: Self-pay | Admitting: *Deleted

## 2022-11-12 ENCOUNTER — Inpatient Hospital Stay (HOSPITAL_BASED_OUTPATIENT_CLINIC_OR_DEPARTMENT_OTHER): Payer: BC Managed Care – PPO | Admitting: Oncology

## 2022-11-12 ENCOUNTER — Inpatient Hospital Stay: Payer: BC Managed Care – PPO

## 2022-11-12 ENCOUNTER — Telehealth: Payer: Self-pay | Admitting: Oncology

## 2022-11-12 VITALS — BP 118/82 | HR 67 | Temp 97.6°F | Resp 18

## 2022-11-12 VITALS — BP 102/68 | HR 81 | Temp 96.2°F | Resp 18 | Ht 65.0 in | Wt 267.7 lb

## 2022-11-12 DIAGNOSIS — C541 Malignant neoplasm of endometrium: Secondary | ICD-10-CM

## 2022-11-12 DIAGNOSIS — G620 Drug-induced polyneuropathy: Secondary | ICD-10-CM

## 2022-11-12 DIAGNOSIS — M899 Disorder of bone, unspecified: Secondary | ICD-10-CM | POA: Diagnosis not present

## 2022-11-12 DIAGNOSIS — Z79899 Other long term (current) drug therapy: Secondary | ICD-10-CM | POA: Diagnosis not present

## 2022-11-12 DIAGNOSIS — R109 Unspecified abdominal pain: Secondary | ICD-10-CM | POA: Diagnosis not present

## 2022-11-12 DIAGNOSIS — T451X5A Adverse effect of antineoplastic and immunosuppressive drugs, initial encounter: Secondary | ICD-10-CM | POA: Diagnosis not present

## 2022-11-12 DIAGNOSIS — Z809 Family history of malignant neoplasm, unspecified: Secondary | ICD-10-CM | POA: Diagnosis not present

## 2022-11-12 DIAGNOSIS — R918 Other nonspecific abnormal finding of lung field: Secondary | ICD-10-CM | POA: Diagnosis not present

## 2022-11-12 DIAGNOSIS — Z791 Long term (current) use of non-steroidal anti-inflammatories (NSAID): Secondary | ICD-10-CM | POA: Diagnosis not present

## 2022-11-12 DIAGNOSIS — C772 Secondary and unspecified malignant neoplasm of intra-abdominal lymph nodes: Secondary | ICD-10-CM | POA: Diagnosis not present

## 2022-11-12 DIAGNOSIS — Z90722 Acquired absence of ovaries, bilateral: Secondary | ICD-10-CM | POA: Diagnosis not present

## 2022-11-12 DIAGNOSIS — Z975 Presence of (intrauterine) contraceptive device: Secondary | ICD-10-CM | POA: Diagnosis not present

## 2022-11-12 DIAGNOSIS — Z5112 Encounter for antineoplastic immunotherapy: Secondary | ICD-10-CM

## 2022-11-12 DIAGNOSIS — G629 Polyneuropathy, unspecified: Secondary | ICD-10-CM | POA: Diagnosis not present

## 2022-11-12 DIAGNOSIS — D649 Anemia, unspecified: Secondary | ICD-10-CM | POA: Diagnosis not present

## 2022-11-12 DIAGNOSIS — I10 Essential (primary) hypertension: Secondary | ICD-10-CM | POA: Diagnosis not present

## 2022-11-12 DIAGNOSIS — Z90710 Acquired absence of both cervix and uterus: Secondary | ICD-10-CM | POA: Diagnosis not present

## 2022-11-12 DIAGNOSIS — Z86711 Personal history of pulmonary embolism: Secondary | ICD-10-CM | POA: Diagnosis not present

## 2022-11-12 DIAGNOSIS — Z7901 Long term (current) use of anticoagulants: Secondary | ICD-10-CM | POA: Diagnosis not present

## 2022-11-12 LAB — CBC WITH DIFFERENTIAL/PLATELET
Abs Immature Granulocytes: 0.02 10*3/uL (ref 0.00–0.07)
Basophils Absolute: 0 10*3/uL (ref 0.0–0.1)
Basophils Relative: 1 %
Eosinophils Absolute: 0.2 10*3/uL (ref 0.0–0.5)
Eosinophils Relative: 4 %
HCT: 33.3 % — ABNORMAL LOW (ref 36.0–46.0)
Hemoglobin: 11.7 g/dL — ABNORMAL LOW (ref 12.0–15.0)
Immature Granulocytes: 0 %
Lymphocytes Relative: 41 %
Lymphs Abs: 2 10*3/uL (ref 0.7–4.0)
MCH: 26.1 pg (ref 26.0–34.0)
MCHC: 35.1 g/dL (ref 30.0–36.0)
MCV: 74.3 fL — ABNORMAL LOW (ref 80.0–100.0)
Monocytes Absolute: 0.3 10*3/uL (ref 0.1–1.0)
Monocytes Relative: 6 %
Neutro Abs: 2.3 10*3/uL (ref 1.7–7.7)
Neutrophils Relative %: 48 %
Platelets: 245 10*3/uL (ref 150–400)
RBC: 4.48 MIL/uL (ref 3.87–5.11)
RDW: 15.8 % — ABNORMAL HIGH (ref 11.5–15.5)
WBC: 4.8 10*3/uL (ref 4.0–10.5)
nRBC: 0 % (ref 0.0–0.2)

## 2022-11-12 LAB — COMPREHENSIVE METABOLIC PANEL WITH GFR
ALT: 13 U/L (ref 0–44)
AST: 21 U/L (ref 15–41)
Albumin: 3.5 g/dL (ref 3.5–5.0)
Alkaline Phosphatase: 61 U/L (ref 38–126)
Anion gap: 6 (ref 5–15)
BUN: 17 mg/dL (ref 6–20)
CO2: 25 mmol/L (ref 22–32)
Calcium: 9.3 mg/dL (ref 8.9–10.3)
Chloride: 105 mmol/L (ref 98–111)
Creatinine, Ser: 0.68 mg/dL (ref 0.44–1.00)
GFR, Estimated: >60 mL/min (ref >60)
Glucose, Bld: 78 mg/dL (ref 70–99)
Potassium: 3.3 mmol/L — ABNORMAL LOW (ref 3.5–5.1)
Sodium: 136 mmol/L (ref 135–145)
Total Bilirubin: 0.6 mg/dL (ref 0.3–1.2)
Total Protein: 7.5 g/dL (ref 6.5–8.1)

## 2022-11-12 LAB — TSH: TSH: 1.958 u[IU]/mL (ref 0.350–4.500)

## 2022-11-12 MED ORDER — SODIUM CHLORIDE 0.9 % IV SOLN
Freq: Once | INTRAVENOUS | Status: AC
  Filled 2022-11-12: qty 250

## 2022-11-12 MED ORDER — SODIUM CHLORIDE 0.9 % IV SOLN
400.0000 mg | Freq: Once | INTRAVENOUS | Status: AC
  Administered 2022-11-12: 400 mg via INTRAVENOUS
  Filled 2022-11-12: qty 16

## 2022-11-12 MED ORDER — HEPARIN SOD (PORK) LOCK FLUSH 100 UNIT/ML IV SOLN
500.0000 [IU] | Freq: Once | INTRAVENOUS | Status: AC | PRN
  Administered 2022-11-12: 500 [IU]
  Filled 2022-11-12: qty 5

## 2022-11-12 MED ORDER — SODIUM CHLORIDE 0.9 % IV SOLN
10.0000 mg | Freq: Once | INTRAVENOUS | Status: AC
  Administered 2022-11-12: 10 mg via INTRAVENOUS
  Filled 2022-11-12: qty 10

## 2022-11-12 NOTE — Progress Notes (Signed)
Hematology/Oncology Consult note [facility]  Telephone:([phone] [phone]  Patient Care Team: Center, [facility] as PCP - General (General Practice) [name], RN as Oncology Nurse Navigator [name], MD as Medical Oncologist (Oncology)   Name of the patient: Jean Morris  [id number]  1975-06-11   Date of visit: 11/12/22  Diagnosis- stage IVb endometrioid endometrial cancer   Chief complaint/ Reason for visit-on treatment assessment prior to cycle 7 of palliative Keytruda  Heme/Onc history:  Patient is a 47 year old female who has seen Dr. Logan Bores for irregular menstrual bleeding.  He has had an IUD in place and initially it was attribute it to use of IUD and was tried to be controlled with progesterone agents.  However due to worsening abdominal pain patient underwent a CT abdomen and pelvis with contrast on 12/25/2021 which showed 8 mm   Posterior lung base mass.  Retroperitoneal adenopathy measuring 2.6 cm additional retrocrural adenopathy.  Faint lucent lesion involving L2 vertebral body.  The uterus is anteverted and enlarged.  Endometrium is enlarged heterogeneous and irregular extending to the fundal myometrium.  Findings consistent with endometrial malignancy.  CT chest without contrast showed a 10 mm right lower lobe subpleural nodule.   Patient had endometrial biopsy which was consistent FIGO grade 2 endometrioid endometrial carcinoma.  Patient was seen by GYN oncology and hopes to take her for surgery if she has good response to neoadjuvant chemotherapy.MSI unstable.  BRAF negative.  Loss of major and minor MMR proteins MLH1 and PMS2 less than 5% of tumor expression.  MLH1 hyper methylation present.  This is most likely due to somatic epigenetic modification which can be seen in about 77% of sporadic endometrial cancers.   Patient completed 6 cycles of CarboTaxol Keytruda chemotherapy and is currently on  maintenance Keytruda.  PET CT scan on 05/19/2022 showed enlarged uterus with hypermetabolic endometrial thickening.  Improving left periaortic nodal metastases.  No definitive bone metastases was noted.  Plan is to proceed with cytoreductive surgery at [facility].  Patient underwent hysterectomy and bilateral salpingo-oophorectomy on 06/10/2022.  Final pathology showed residual endometrioid adenocarcinoma of the endometrium FIGO grade 2 with marked treatment effect invading 18 mm and a 34 mm thick myometrium.  Fallopian tubes and ovaries were negative for malignancy.  Pelvic washings were negative for malignancy.   Patient is currently on maintenance Keytruda and plan is to do 14 maintenance cycles  Interval history-tolerating Keytruda well so far.  Occasional abdominal pain especially when she lies down but there are days when she goes without pain as well.  She is trying to rely less on her oxycodone.  Neuropathy symptoms currently not controlled with gabapentin 300 mg at night  ECOG PS- 1 Pain scale- 0   Review of systems- Review of Systems  Constitutional:  Negative for chills, fever, malaise/fatigue and weight loss.  HENT:  Negative for congestion, ear discharge and nosebleeds.   Eyes:  Negative for blurred vision.  Respiratory:  Negative for cough, hemoptysis, sputum production, shortness of breath and wheezing.   Cardiovascular:  Negative for chest pain, palpitations, orthopnea and claudication.  Gastrointestinal:  Positive for abdominal pain. Negative for blood in stool, constipation, diarrhea, heartburn, melena, nausea and vomiting.  Genitourinary:  Negative for dysuria, flank pain, frequency, hematuria and urgency.  Musculoskeletal:  Negative for back pain, joint pain and myalgias.  Skin:  Negative for rash.  Neurological:  Negative for dizziness, tingling, focal weakness, seizures, weakness and headaches.  Endo/Heme/Allergies:  Does not bruise/bleed easily.  Psychiatric/Behavioral:  Negative  for depression and suicidal ideas. The patient does not have insomnia.       Allergies  Allergen Reactions   Bee Pollen Nausea And Vomiting    Itchy, swelling, watery eyes, runny nose.   Pollen Extract Nausea And Vomiting    Itchy, swelling, watery eyes, runny nose.     Past Medical History:  Diagnosis Date   Anemia    Endometrial cancer (HCC)    Hypertension    Menorrhagia    Pulmonary embolism [facility])      Past Surgical History:  Procedure Laterality Date   ABDOMINAL HYSTERECTOMY Bilateral 06/10/2022   at [facility]   CESAREAN SECTION  02/25/1992   COLONOSCOPY WITH PROPOFOL N/A 08/06/2021   Procedure: COLONOSCOPY WITH PROPOFOL;  Surgeon: Wyline Mood, MD;  Location: [facility] ENDOSCOPY;  Service: Gastroenterology;  Laterality: N/A;   ESOPHAGOGASTRODUODENOSCOPY (EGD) WITH PROPOFOL N/A 02/09/2020   Procedure: ESOPHAGOGASTRODUODENOSCOPY (EGD) WITH PROPOFOL;  Surgeon: Wyline Mood, MD;  Location: [facility] ENDOSCOPY;  Service: Gastroenterology;  Laterality: N/A;   IR IMAGING GUIDED PORT INSERTION  01/17/2022    Social History   Socioeconomic History   Marital status: Media planner    Spouse name: Not on file   Number of children: Not on file   Years of education: Not on file   Highest education level: Not on file  Occupational History   Not on file  Tobacco Use   Smoking status: Never   Smokeless tobacco: Never  Vaping Use   Vaping status: Never Used  Substance and Sexual Activity   Alcohol use: No   Drug use: No   Sexual activity: Not Currently    Birth control/protection: I.U.D.    Comment: patient states MD said did not see in CT today 01/17/2022  Other Topics Concern   Not on file  Social History Narrative   Lives with [facility], partner, and no pets.   Social Determinants of Health   Financial Resource Strain: Not on file  Food Insecurity: Not on file  Transportation Needs: Not on file  Physical Activity: Not on file  Stress: Not on file  Social Connections: Not on  file  Intimate Partner Violence: Not on file    Family History  Problem Relation Age of Onset   Hypertension Mother    Stroke Mother    Heart attack Mother    Cancer Father    Hypertension Father    Cancer Sister    Cancer Sister    Cancer Brother    Hypertension Other      Current Outpatient Medications:    apixaban (ELIQUIS) 2.5 MG TABS tablet, Take 2.5 mg by mouth 2 (two) times daily., Disp: , Rfl:    diclofenac Sodium (VOLTAREN) 1 % GEL, Apply 4 g topically 4 (four) times daily., Disp: 50 g, Rfl: 0   fluconazole (DIFLUCAN) 150 MG tablet, Take 1 tablet (150 mg total) by mouth daily. Take 1 tablet (150 mg) by mouth. Three days later, take second tablet., Disp: 2 tablet, Rfl: 0   gabapentin (NEURONTIN) 100 MG capsule, Take 300 mg by mouth at bedtime., Disp: , Rfl:    KLOR-CON M20 20 MEQ tablet, TAKE 1 TABLET (20 MEQ TOTAL) BY MOUTH 2 (TWO) TIMES DAILY. DISSOLVE TABLETS, Disp: 60 tablet, Rfl: 2   lidocaine-prilocaine (EMLA) cream, Apply to port site as directed, Disp: 30 g, Rfl: 3   LORazepam (ATIVAN) 0.5 MG tablet, Take 1 tablet (0.5 mg total) by mouth  every 8 (eight) hours., Disp: 30 tablet, Rfl: 0   magnesium hydroxide (MILK OF MAGNESIA) 400 MG/5ML suspension, Take 15 mLs by mouth daily as needed for moderate constipation. Take if no bowel movement for 2 days., Disp: , Rfl:    methocarbamol (ROBAXIN) 500 MG tablet, Take 1 tablet (500 mg total) by mouth 2 (two) times daily as needed for muscle spasms., Disp: 30 tablet, Rfl: 0   nystatin (MYCOSTATIN/NYSTOP) powder, Apply 1 Application topically 3 (three) times daily., Disp: 30 g, Rfl: 3   oxyCODONE (OXY IR/ROXICODONE) 5 MG immediate release tablet, Take 1 tablet (5 mg total) by mouth every 4 (four) hours as needed for severe pain., Disp: 120 tablet, Rfl: 0   pantoprazole (PROTONIX) 40 MG tablet, Take 1 tablet (40 mg total) by mouth daily., Disp: 30 tablet, Rfl: 2   polyethylene glycol (MIRALAX) 17 g packet, Take 17 g by mouth daily.  To prevent constipation, Disp: 30 each, Rfl: 3   pregabalin (LYRICA) 75 MG capsule, Take 1 capsule (75 mg total) by mouth 2 (two) times daily., Disp: 60 capsule, Rfl: 1   prochlorperazine (COMPAZINE) 10 MG tablet, Take 1 tablet (10 mg total) by mouth every 6 (six) hours as needed for nausea or vomiting., Disp: 30 tablet, Rfl: 1   senna (SENOKOT) 8.6 MG TABS tablet, Take 1-2 tablets (8.6-17.2 mg total) by mouth daily. To prevent constipation, Disp: 120 tablet, Rfl: 0   benzonatate (TESSALON) 100 MG capsule, Take 1 capsule (100 mg total) by mouth 3 (three) times daily as needed for cough. (Patient not taking: Reported on 08/20/2022), Disp: 20 capsule, Rfl: 0   levonorgestrel (MIRENA) 20 MCG/24HR IUD, 1 Intra Uterine Device (1 each total) by Intrauterine route once., Disp: 1 each, Rfl: 0  Physical exam:  Vitals:   11/12/22 0916  BP: 102/68  Pulse: 81  Resp: 18  Temp: (!) 96.2 F (35.7 C)  TempSrc: Tympanic  SpO2: 100%  Weight: 267 lb 11.2 oz (121.4 kg)  Height: 5\' 5"  (1.651 m)   Physical Exam Cardiovascular:     Rate and Rhythm: Normal rate and regular rhythm.     Heart sounds: Normal heart sounds.  Pulmonary:     Effort: Pulmonary effort is normal.     Breath sounds: Normal breath sounds.  Abdominal:     General: Bowel sounds are normal.     Palpations: Abdomen is soft.  Skin:    General: Skin is warm and dry.  Neurological:     Mental Status: She is alert and oriented to person, place, and time.         Latest Ref Rng & Units 11/12/2022    8:54 AM  CMP  Glucose 70 - 99 mg/dL 78   BUN 6 - 20 mg/dL 17   Creatinine 9.14 - 1.00 mg/dL 7.82   Sodium 956 - 213 mmol/L 136   Potassium 3.5 - 5.1 mmol/L 3.3   Chloride 98 - 111 mmol/L 105   CO2 22 - 32 mmol/L 25   Calcium 8.9 - 10.3 mg/dL 9.3   Total Protein 6.5 - 8.1 g/dL 7.5   Total Bilirubin 0.3 - 1.2 mg/dL 0.6   Alkaline Phos 38 - 126 U/L 61   AST 15 - 41 U/L 21   ALT 0 - 44 U/L 13       Latest Ref Rng & Units 11/12/2022     8:54 AM  CBC  WBC 4.0 - 10.5 K/uL 4.8   Hemoglobin 12.0 - 15.0  g/dL 59.5   Hematocrit 63.8 - 46.0 % 33.3   Platelets 150 - 400 K/uL 245      Assessment and plan- Patient is a 47 y.o. female with stage IV endometrioid endometrial carcinoma T2 N2 M1.  She is here for on treatment assessment prior to cycle 7 of palliative Keytruda  Counts okay to proceed with cycle 7 of palliative Keytruda today.  TSH is normal.  I will see her back in 6 weeks for cycle 8.  Plan is to do total 14 maintenance cycles.  I will plan to get repeat CT chest abdomen and pelvis with contrast for a PET scan in about 8 weeks time  Abdominal pain: PossiblePostsurgical.  She is going to rely less on her oxycodone.  Chemo-induced peripheral neuropathy: Have asked her to increase her gabapentin to 600 mg at night.  We will send her a new prescription   Visit Diagnosis 1. Endometrial cancer (HCC)   2. Encounter for antineoplastic immunotherapy      Dr. Owens Shark, MD, MPH [facility] at [facility] [phone] 11/12/2022 1:03 PM

## 2022-11-12 NOTE — Telephone Encounter (Signed)
Per infusion  nurse- infusion time is not correct- patient does not nee 5 hours. Requested team check the plan and make sure.

## 2022-11-13 ENCOUNTER — Encounter: Payer: Self-pay | Admitting: *Deleted

## 2022-11-14 ENCOUNTER — Other Ambulatory Visit: Payer: Self-pay | Admitting: *Deleted

## 2022-11-14 MED ORDER — OXYCODONE HCL 5 MG PO TABS: 5.0000 mg | ORAL_TABLET | ORAL | 0 refills | Status: DC | PRN

## 2022-11-14 NOTE — Telephone Encounter (Signed)
She needs to coem to get her iron levels checked. I can't attribute her headaches to low iron levels until we know what her iron levels are

## 2022-11-20 ENCOUNTER — Inpatient Hospital Stay: Payer: BC Managed Care – PPO

## 2022-11-20 ENCOUNTER — Other Ambulatory Visit: Payer: Self-pay

## 2022-11-20 ENCOUNTER — Telehealth: Payer: Self-pay

## 2022-11-20 DIAGNOSIS — D649 Anemia, unspecified: Secondary | ICD-10-CM

## 2022-11-20 DIAGNOSIS — C541 Malignant neoplasm of endometrium: Secondary | ICD-10-CM

## 2022-11-20 MED ORDER — NYSTATIN 100000 UNIT/GM EX POWD: 1.0000 | Freq: Three times a day (TID) | CUTANEOUS | 3 refills | Status: DC

## 2022-11-20 NOTE — Telephone Encounter (Signed)
Jean Morris has asked for a refill of her Nystantin powder & she has asked for labs to see if she needs any IV iron. Appointment has been made for 09/27 at 8:30AM.

## 2022-11-21 ENCOUNTER — Encounter: Payer: Self-pay | Admitting: *Deleted

## 2022-11-21 ENCOUNTER — Inpatient Hospital Stay: Payer: BC Managed Care – PPO

## 2022-11-21 DIAGNOSIS — C541 Malignant neoplasm of endometrium: Secondary | ICD-10-CM | POA: Diagnosis not present

## 2022-11-21 DIAGNOSIS — G620 Drug-induced polyneuropathy: Secondary | ICD-10-CM | POA: Diagnosis not present

## 2022-11-21 DIAGNOSIS — Z79899 Other long term (current) drug therapy: Secondary | ICD-10-CM | POA: Diagnosis not present

## 2022-11-21 DIAGNOSIS — R918 Other nonspecific abnormal finding of lung field: Secondary | ICD-10-CM | POA: Diagnosis not present

## 2022-11-21 DIAGNOSIS — D649 Anemia, unspecified: Secondary | ICD-10-CM

## 2022-11-21 DIAGNOSIS — M899 Disorder of bone, unspecified: Secondary | ICD-10-CM | POA: Diagnosis not present

## 2022-11-21 DIAGNOSIS — G629 Polyneuropathy, unspecified: Secondary | ICD-10-CM | POA: Diagnosis not present

## 2022-11-21 DIAGNOSIS — Z86711 Personal history of pulmonary embolism: Secondary | ICD-10-CM | POA: Diagnosis not present

## 2022-11-21 DIAGNOSIS — Z7901 Long term (current) use of anticoagulants: Secondary | ICD-10-CM | POA: Diagnosis not present

## 2022-11-21 DIAGNOSIS — Z90710 Acquired absence of both cervix and uterus: Secondary | ICD-10-CM | POA: Diagnosis not present

## 2022-11-21 DIAGNOSIS — I10 Essential (primary) hypertension: Secondary | ICD-10-CM | POA: Diagnosis not present

## 2022-11-21 DIAGNOSIS — Z791 Long term (current) use of non-steroidal anti-inflammatories (NSAID): Secondary | ICD-10-CM | POA: Diagnosis not present

## 2022-11-21 DIAGNOSIS — Z975 Presence of (intrauterine) contraceptive device: Secondary | ICD-10-CM | POA: Diagnosis not present

## 2022-11-21 DIAGNOSIS — C772 Secondary and unspecified malignant neoplasm of intra-abdominal lymph nodes: Secondary | ICD-10-CM | POA: Diagnosis not present

## 2022-11-21 DIAGNOSIS — Z5112 Encounter for antineoplastic immunotherapy: Secondary | ICD-10-CM | POA: Diagnosis not present

## 2022-11-21 DIAGNOSIS — Z809 Family history of malignant neoplasm, unspecified: Secondary | ICD-10-CM | POA: Diagnosis not present

## 2022-11-21 DIAGNOSIS — Z90722 Acquired absence of ovaries, bilateral: Secondary | ICD-10-CM | POA: Diagnosis not present

## 2022-11-21 DIAGNOSIS — R109 Unspecified abdominal pain: Secondary | ICD-10-CM | POA: Diagnosis not present

## 2022-11-21 LAB — IRON AND TIBC
Iron: 64 ug/dL (ref 28–170)
Saturation Ratios: 24 % (ref 10.4–31.8)
TIBC: 270 ug/dL (ref 250–450)
UIBC: 206 ug/dL

## 2022-11-21 LAB — CBC (CANCER CENTER ONLY)
HCT: 34.7 % — ABNORMAL LOW (ref 36.0–46.0)
Hemoglobin: 12.3 g/dL (ref 12.0–15.0)
MCH: 26.7 pg (ref 26.0–34.0)
MCHC: 35.4 g/dL (ref 30.0–36.0)
MCV: 75.4 fL — ABNORMAL LOW (ref 80.0–100.0)
Platelet Count: 240 10*3/uL (ref 150–400)
RBC: 4.6 MIL/uL (ref 3.87–5.11)
RDW: 15.7 % — ABNORMAL HIGH (ref 11.5–15.5)
WBC Count: 4.6 10*3/uL (ref 4.0–10.5)
nRBC: 0 % (ref 0.0–0.2)

## 2022-11-21 LAB — FERRITIN: Ferritin: 29 ng/mL (ref 11–307)

## 2022-11-26 ENCOUNTER — Encounter: Payer: Self-pay | Admitting: Oncology

## 2022-11-27 ENCOUNTER — Inpatient Hospital Stay: Payer: BC Managed Care – PPO | Attending: Obstetrics and Gynecology

## 2022-11-27 ENCOUNTER — Other Ambulatory Visit: Payer: Self-pay | Admitting: Oncology

## 2022-11-27 VITALS — BP 128/74 | HR 72 | Temp 98.6°F | Resp 18

## 2022-11-27 DIAGNOSIS — D509 Iron deficiency anemia, unspecified: Secondary | ICD-10-CM | POA: Diagnosis not present

## 2022-11-27 DIAGNOSIS — D508 Other iron deficiency anemias: Secondary | ICD-10-CM

## 2022-11-27 DIAGNOSIS — C541 Malignant neoplasm of endometrium: Secondary | ICD-10-CM | POA: Insufficient documentation

## 2022-11-27 DIAGNOSIS — Z79899 Other long term (current) drug therapy: Secondary | ICD-10-CM | POA: Diagnosis not present

## 2022-11-27 MED ORDER — SODIUM CHLORIDE 0.9 % IV SOLN
Freq: Once | INTRAVENOUS | Status: AC
  Filled 2022-11-27: qty 250

## 2022-11-27 MED ORDER — HEPARIN SOD (PORK) LOCK FLUSH 100 UNIT/ML IV SOLN
500.0000 [IU] | Freq: Once | INTRAVENOUS | Status: AC | PRN
  Administered 2022-11-27: 500 [IU]
  Filled 2022-11-27: qty 5

## 2022-11-27 MED ORDER — SODIUM CHLORIDE 0.9% FLUSH
10.0000 mL | Freq: Once | INTRAVENOUS | Status: AC | PRN
  Administered 2022-11-27: 10 mL
  Filled 2022-11-27: qty 10

## 2022-11-27 MED ORDER — SODIUM CHLORIDE 0.9 % IV SOLN
200.0000 mg | INTRAVENOUS | Status: AC
  Administered 2022-11-27: 200 mg via INTRAVENOUS
  Filled 2022-11-27: qty 200

## 2022-12-01 ENCOUNTER — Other Ambulatory Visit: Payer: Self-pay | Admitting: *Deleted

## 2022-12-02 ENCOUNTER — Encounter: Payer: Self-pay | Admitting: Oncology

## 2022-12-02 MED ORDER — PREGABALIN 75 MG PO CAPS: 75.0000 mg | ORAL_CAPSULE | Freq: Two times a day (BID) | ORAL | 1 refills | Status: DC

## 2022-12-04 ENCOUNTER — Other Ambulatory Visit: Payer: Self-pay | Admitting: Nurse Practitioner

## 2022-12-04 ENCOUNTER — Inpatient Hospital Stay: Payer: BC Managed Care – PPO

## 2022-12-04 VITALS — BP 117/69 | HR 74 | Temp 98.3°F | Resp 18

## 2022-12-04 DIAGNOSIS — D509 Iron deficiency anemia, unspecified: Secondary | ICD-10-CM | POA: Diagnosis not present

## 2022-12-04 DIAGNOSIS — Z79899 Other long term (current) drug therapy: Secondary | ICD-10-CM | POA: Diagnosis not present

## 2022-12-04 DIAGNOSIS — C541 Malignant neoplasm of endometrium: Secondary | ICD-10-CM | POA: Diagnosis not present

## 2022-12-04 DIAGNOSIS — D508 Other iron deficiency anemias: Secondary | ICD-10-CM

## 2022-12-04 MED ORDER — SODIUM CHLORIDE 0.9 % IV SOLN
200.0000 mg | INTRAVENOUS | Status: DC
  Administered 2022-12-04: 200 mg via INTRAVENOUS
  Filled 2022-12-04: qty 200

## 2022-12-04 MED ORDER — SODIUM CHLORIDE 0.9% FLUSH
10.0000 mL | Freq: Once | INTRAVENOUS | Status: AC | PRN
  Administered 2022-12-04: 10 mL
  Filled 2022-12-04: qty 10

## 2022-12-04 MED ORDER — HEPARIN SOD (PORK) LOCK FLUSH 100 UNIT/ML IV SOLN
500.0000 [IU] | Freq: Once | INTRAVENOUS | Status: AC | PRN
  Administered 2022-12-04: 500 [IU]
  Filled 2022-12-04: qty 5

## 2022-12-04 MED ORDER — SODIUM CHLORIDE 0.9 % IV SOLN
Freq: Once | INTRAVENOUS | Status: AC
  Filled 2022-12-04: qty 250

## 2022-12-05 ENCOUNTER — Encounter: Payer: Self-pay | Admitting: Oncology

## 2022-12-09 ENCOUNTER — Inpatient Hospital Stay: Payer: BC Managed Care – PPO

## 2022-12-09 VITALS — BP 131/84 | HR 73 | Temp 97.6°F | Resp 18

## 2022-12-09 DIAGNOSIS — Z79899 Other long term (current) drug therapy: Secondary | ICD-10-CM | POA: Diagnosis not present

## 2022-12-09 DIAGNOSIS — C541 Malignant neoplasm of endometrium: Secondary | ICD-10-CM | POA: Diagnosis not present

## 2022-12-09 DIAGNOSIS — D509 Iron deficiency anemia, unspecified: Secondary | ICD-10-CM | POA: Diagnosis not present

## 2022-12-09 DIAGNOSIS — D508 Other iron deficiency anemias: Secondary | ICD-10-CM

## 2022-12-09 MED ORDER — HEPARIN SOD (PORK) LOCK FLUSH 100 UNIT/ML IV SOLN
500.0000 [IU] | Freq: Once | INTRAVENOUS | Status: AC | PRN
  Administered 2022-12-09: 500 [IU]
  Filled 2022-12-09: qty 5

## 2022-12-09 MED ORDER — SODIUM CHLORIDE 0.9% FLUSH
10.0000 mL | Freq: Once | INTRAVENOUS | Status: AC | PRN
  Administered 2022-12-09: 10 mL
  Filled 2022-12-09: qty 10

## 2022-12-09 MED ORDER — SODIUM CHLORIDE 0.9 % IV SOLN
Freq: Once | INTRAVENOUS | Status: AC
  Filled 2022-12-09: qty 250

## 2022-12-09 MED ORDER — SODIUM CHLORIDE 0.9 % IV SOLN
200.0000 mg | INTRAVENOUS | Status: DC
  Administered 2022-12-09: 200 mg via INTRAVENOUS
  Filled 2022-12-09: qty 200

## 2022-12-11 ENCOUNTER — Inpatient Hospital Stay: Payer: BC Managed Care – PPO

## 2022-12-11 VITALS — BP 124/71 | HR 82 | Temp 97.0°F | Resp 18

## 2022-12-11 DIAGNOSIS — D509 Iron deficiency anemia, unspecified: Secondary | ICD-10-CM | POA: Diagnosis not present

## 2022-12-11 DIAGNOSIS — D508 Other iron deficiency anemias: Secondary | ICD-10-CM

## 2022-12-11 DIAGNOSIS — C541 Malignant neoplasm of endometrium: Secondary | ICD-10-CM | POA: Diagnosis not present

## 2022-12-11 DIAGNOSIS — Z79899 Other long term (current) drug therapy: Secondary | ICD-10-CM | POA: Diagnosis not present

## 2022-12-11 MED ORDER — HEPARIN SOD (PORK) LOCK FLUSH 100 UNIT/ML IV SOLN
500.0000 [IU] | Freq: Once | INTRAVENOUS | Status: AC | PRN
  Administered 2022-12-11: 500 [IU]
  Filled 2022-12-11: qty 5

## 2022-12-11 MED ORDER — SODIUM CHLORIDE 0.9 % IV SOLN
200.0000 mg | Freq: Once | INTRAVENOUS | Status: AC
  Administered 2022-12-11: 200 mg via INTRAVENOUS
  Filled 2022-12-11: qty 200

## 2022-12-11 MED ORDER — SODIUM CHLORIDE 0.9 % IV SOLN
Freq: Once | INTRAVENOUS | Status: AC
  Filled 2022-12-11: qty 250

## 2022-12-16 ENCOUNTER — Inpatient Hospital Stay: Payer: BC Managed Care – PPO

## 2022-12-16 ENCOUNTER — Other Ambulatory Visit: Payer: Self-pay | Admitting: *Deleted

## 2022-12-16 VITALS — BP 103/72 | HR 88 | Temp 98.1°F | Resp 18

## 2022-12-16 DIAGNOSIS — Z79899 Other long term (current) drug therapy: Secondary | ICD-10-CM | POA: Diagnosis not present

## 2022-12-16 DIAGNOSIS — C541 Malignant neoplasm of endometrium: Secondary | ICD-10-CM

## 2022-12-16 DIAGNOSIS — D509 Iron deficiency anemia, unspecified: Secondary | ICD-10-CM | POA: Diagnosis not present

## 2022-12-16 DIAGNOSIS — D508 Other iron deficiency anemias: Secondary | ICD-10-CM

## 2022-12-16 MED ORDER — LIDOCAINE-PRILOCAINE 2.5-2.5 % EX CREA: TOPICAL_CREAM | CUTANEOUS | 3 refills | Status: DC

## 2022-12-16 MED ORDER — HEPARIN SOD (PORK) LOCK FLUSH 100 UNIT/ML IV SOLN
250.0000 [IU] | Freq: Once | INTRAVENOUS | Status: DC | PRN
  Filled 2022-12-16: qty 5

## 2022-12-16 MED ORDER — SODIUM CHLORIDE 0.9% FLUSH
3.0000 mL | Freq: Once | INTRAVENOUS | Status: DC | PRN
  Filled 2022-12-16: qty 3

## 2022-12-16 MED ORDER — HEPARIN SOD (PORK) LOCK FLUSH 100 UNIT/ML IV SOLN
500.0000 [IU] | Freq: Once | INTRAVENOUS | Status: AC | PRN
  Administered 2022-12-16: 500 [IU]
  Filled 2022-12-16: qty 5

## 2022-12-16 MED ORDER — IRON SUCROSE 20 MG/ML IV SOLN
200.0000 mg | INTRAVENOUS | Status: DC
Start: 2022-12-16 — End: 2022-12-16
  Administered 2022-12-16: 200 mg via INTRAVENOUS
  Filled 2022-12-16: qty 10

## 2022-12-16 MED ORDER — SODIUM CHLORIDE 0.9% FLUSH
10.0000 mL | Freq: Once | INTRAVENOUS | Status: AC | PRN
  Administered 2022-12-16: 10 mL
  Filled 2022-12-16: qty 10

## 2022-12-17 ENCOUNTER — Other Ambulatory Visit: Payer: Self-pay | Admitting: Oncology

## 2022-12-17 DIAGNOSIS — C541 Malignant neoplasm of endometrium: Secondary | ICD-10-CM

## 2022-12-19 ENCOUNTER — Encounter: Payer: Self-pay | Admitting: Oncology

## 2022-12-23 ENCOUNTER — Inpatient Hospital Stay: Payer: BC Managed Care – PPO

## 2022-12-23 ENCOUNTER — Encounter: Payer: Self-pay | Admitting: Oncology

## 2022-12-23 ENCOUNTER — Inpatient Hospital Stay (HOSPITAL_BASED_OUTPATIENT_CLINIC_OR_DEPARTMENT_OTHER): Payer: BC Managed Care – PPO | Admitting: Oncology

## 2022-12-23 VITALS — BP 123/82 | HR 71 | Temp 97.2°F | Resp 18 | Wt 288.3 lb

## 2022-12-23 DIAGNOSIS — C541 Malignant neoplasm of endometrium: Secondary | ICD-10-CM

## 2022-12-23 DIAGNOSIS — Z5112 Encounter for antineoplastic immunotherapy: Secondary | ICD-10-CM

## 2022-12-23 DIAGNOSIS — D509 Iron deficiency anemia, unspecified: Secondary | ICD-10-CM | POA: Diagnosis not present

## 2022-12-23 DIAGNOSIS — G620 Drug-induced polyneuropathy: Secondary | ICD-10-CM | POA: Diagnosis not present

## 2022-12-23 DIAGNOSIS — T451X5A Adverse effect of antineoplastic and immunosuppressive drugs, initial encounter: Secondary | ICD-10-CM | POA: Diagnosis not present

## 2022-12-23 DIAGNOSIS — Z79899 Other long term (current) drug therapy: Secondary | ICD-10-CM | POA: Diagnosis not present

## 2022-12-23 LAB — COMPREHENSIVE METABOLIC PANEL
ALT: 15 U/L (ref 0–44)
AST: 20 U/L (ref 15–41)
Albumin: 3.5 g/dL (ref 3.5–5.0)
Alkaline Phosphatase: 63 U/L (ref 38–126)
Anion gap: 6 (ref 5–15)
BUN: 13 mg/dL (ref 6–20)
CO2: 26 mmol/L (ref 22–32)
Calcium: 9 mg/dL (ref 8.9–10.3)
Chloride: 105 mmol/L (ref 98–111)
Creatinine, Ser: 0.73 mg/dL (ref 0.44–1.00)
GFR, Estimated: >60 mL/min (ref >60)
Glucose, Bld: 87 mg/dL (ref 70–99)
Potassium: 3.6 mmol/L (ref 3.5–5.1)
Sodium: 137 mmol/L (ref 135–145)
Total Bilirubin: 0.6 mg/dL (ref 0.3–1.2)
Total Protein: 7.5 g/dL (ref 6.5–8.1)

## 2022-12-23 LAB — CBC WITH DIFFERENTIAL/PLATELET
Abs Immature Granulocytes: 0.02 10*3/uL (ref 0.00–0.07)
Basophils Absolute: 0 10*3/uL (ref 0.0–0.1)
Basophils Relative: 1 %
Eosinophils Absolute: 0.3 10*3/uL (ref 0.0–0.5)
Eosinophils Relative: 6 %
HCT: 34.2 % — ABNORMAL LOW (ref 36.0–46.0)
Hemoglobin: 12.1 g/dL (ref 12.0–15.0)
Immature Granulocytes: 1 %
Lymphocytes Relative: 41 %
Lymphs Abs: 1.8 10*3/uL (ref 0.7–4.0)
MCH: 26.8 pg (ref 26.0–34.0)
MCHC: 35.4 g/dL (ref 30.0–36.0)
MCV: 75.8 fL — ABNORMAL LOW (ref 80.0–100.0)
Monocytes Absolute: 0.3 10*3/uL (ref 0.1–1.0)
Monocytes Relative: 6 %
Neutro Abs: 2.1 10*3/uL (ref 1.7–7.7)
Neutrophils Relative %: 45 %
Platelets: 236 10*3/uL (ref 150–400)
RBC: 4.51 MIL/uL (ref 3.87–5.11)
RDW: 17.1 % — ABNORMAL HIGH (ref 11.5–15.5)
WBC: 4.4 10*3/uL (ref 4.0–10.5)
nRBC: 0 % (ref 0.0–0.2)

## 2022-12-23 MED ORDER — DEXAMETHASONE SODIUM PHOSPHATE 10 MG/ML IJ SOLN
10.0000 mg | Freq: Once | INTRAMUSCULAR | Status: AC
  Administered 2022-12-23: 10 mg via INTRAVENOUS
  Filled 2022-12-23: qty 1

## 2022-12-23 MED ORDER — SODIUM CHLORIDE 0.9 % IV SOLN
400.0000 mg | Freq: Once | INTRAVENOUS | Status: AC
  Administered 2022-12-23: 400 mg via INTRAVENOUS
  Filled 2022-12-23: qty 16

## 2022-12-23 MED ORDER — SODIUM CHLORIDE 0.9 % IV SOLN
Freq: Once | INTRAVENOUS | Status: AC
  Filled 2022-12-23: qty 250

## 2022-12-23 MED ORDER — HEPARIN SOD (PORK) LOCK FLUSH 100 UNIT/ML IV SOLN
500.0000 [IU] | Freq: Once | INTRAVENOUS | Status: AC | PRN
Start: 2022-12-23 — End: 2022-12-23
  Administered 2022-12-23: 500 [IU]
  Filled 2022-12-23: qty 5

## 2022-12-23 NOTE — Patient Instructions (Signed)

## 2022-12-23 NOTE — Progress Notes (Signed)
Hematology/Oncology Consult note [facility]  Telephone:([phone] [phone]  Patient Care Team: Center, [facility] as PCP - General (General Practice) [name], RN as Oncology Nurse Navigator [name], MD as Medical Oncologist (Oncology)   Name of the patient: Jean Morris  [id number]  Dec 16, 1975   Date of visit: 12/23/22  Diagnosis- stage IVb endometrioid endometrial cancer   Chief complaint/ Reason for visit-on treatment assessment prior to cycle 7 of palliative Keytruda  Heme/Onc history: Patient is a 47 year old female who has seen Dr. Logan Bores for irregular menstrual bleeding.  He has had an IUD in place and initially it was attribute it to use of IUD and was tried to be controlled with progesterone agents.  However due to worsening abdominal pain patient underwent a CT abdomen and pelvis with contrast on 12/25/2021 which showed 8 mm   Posterior lung base mass.  Retroperitoneal adenopathy measuring 2.6 cm additional retrocrural adenopathy.  Faint lucent lesion involving L2 vertebral body.  The uterus is anteverted and enlarged.  Endometrium is enlarged heterogeneous and irregular extending to the fundal myometrium.  Findings consistent with endometrial malignancy.  CT chest without contrast showed a 10 mm right lower lobe subpleural nodule.   Patient had endometrial biopsy which was consistent FIGO grade 2 endometrioid endometrial carcinoma.  Patient was seen by GYN oncology and hopes to take her for surgery if she has good response to neoadjuvant chemotherapy.MSI unstable.  BRAF negative.  Loss of major and minor MMR proteins MLH1 and PMS2 less than 5% of tumor expression.  MLH1 hyper methylation present.  This is most likely due to somatic epigenetic modification which can be seen in about 77% of sporadic endometrial cancers.   Patient completed 6 cycles of CarboTaxol Keytruda chemotherapy and is currently on  maintenance Keytruda.  PET CT scan on 05/19/2022 showed enlarged uterus with hypermetabolic endometrial thickening.  Improving left periaortic nodal metastases.  No definitive bone metastases was noted.  Plan is to proceed with cytoreductive surgery at [facility].  Patient underwent hysterectomy and bilateral salpingo-oophorectomy on 06/10/2022.  Final pathology showed residual endometrioid adenocarcinoma of the endometrium FIGO grade 2 with marked treatment effect invading 18 mm and a 34 mm thick myometrium.  Fallopian tubes and ovaries were negative for malignancy.  Pelvic washings were negative for malignancy.   Patient is currently on maintenance Keytruda and plan is to do 14 maintenance cycles    Interval history-occasional abdominal pain at night because of which she has difficulty sleeping.  Denies other complaints at this time  ECOG PS- 1 Pain scale- 0   Review of systems- Review of Systems  Constitutional:  Negative for chills, fever, malaise/fatigue and weight loss.  HENT:  Negative for congestion, ear discharge and nosebleeds.   Eyes:  Negative for blurred vision.  Respiratory:  Negative for cough, hemoptysis, sputum production, shortness of breath and wheezing.   Cardiovascular:  Negative for chest pain, palpitations, orthopnea and claudication.  Gastrointestinal:  Negative for abdominal pain, blood in stool, constipation, diarrhea, heartburn, melena, nausea and vomiting.  Genitourinary:  Negative for dysuria, flank pain, frequency, hematuria and urgency.  Musculoskeletal:  Negative for back pain, joint pain and myalgias.  Skin:  Negative for rash.  Neurological:  Negative for dizziness, tingling, focal weakness, seizures, weakness and headaches.  Endo/Heme/Allergies:  Does not bruise/bleed easily.  Psychiatric/Behavioral:  Negative for depression and suicidal ideas. The patient has insomnia.       Allergies  Allergen  Reactions   Bee Pollen Nausea And Vomiting    Itchy, swelling,  watery eyes, runny nose.   Pollen Extract Nausea And Vomiting    Itchy, swelling, watery eyes, runny nose.     Past Medical History:  Diagnosis Date   Anemia    Endometrial cancer (HCC)    Hypertension    Menorrhagia    Pulmonary embolism [facility])      Past Surgical History:  Procedure Laterality Date   ABDOMINAL HYSTERECTOMY Bilateral 06/10/2022   at [facility]   CESAREAN SECTION  02/25/1992   COLONOSCOPY WITH PROPOFOL N/A 08/06/2021   Procedure: COLONOSCOPY WITH PROPOFOL;  Surgeon: Wyline Mood, MD;  Location: [facility] ENDOSCOPY;  Service: Gastroenterology;  Laterality: N/A;   ESOPHAGOGASTRODUODENOSCOPY (EGD) WITH PROPOFOL N/A 02/09/2020   Procedure: ESOPHAGOGASTRODUODENOSCOPY (EGD) WITH PROPOFOL;  Surgeon: Wyline Mood, MD;  Location: [facility] ENDOSCOPY;  Service: Gastroenterology;  Laterality: N/A;   IR IMAGING GUIDED PORT INSERTION  01/17/2022    Social History   Socioeconomic History   Marital status: Media planner    Spouse name: Not on file   Number of children: Not on file   Years of education: Not on file   Highest education level: Not on file  Occupational History   Not on file  Tobacco Use   Smoking status: Never   Smokeless tobacco: Never  Vaping Use   Vaping status: Never Used  Substance and Sexual Activity   Alcohol use: No   Drug use: No   Sexual activity: Not Currently    Birth control/protection: I.U.D.    Comment: patient states MD said did not see in CT today 01/17/2022  Other Topics Concern   Not on file  Social History Narrative   Lives with [facility], partner, and no pets.   Social Determinants of Health   Financial Resource Strain: Not on file  Food Insecurity: Not on file  Transportation Needs: Not on file  Physical Activity: Not on file  Stress: Not on file  Social Connections: Not on file  Intimate Partner Violence: Not on file    Family History  Problem Relation Age of Onset   Hypertension Mother    Stroke Mother    Heart attack Mother     Cancer Father    Hypertension Father    Cancer Sister    Cancer Sister    Cancer Brother    Hypertension Other      Current Outpatient Medications:    benzonatate (TESSALON) 100 MG capsule, Take 1 capsule (100 mg total) by mouth 3 (three) times daily as needed for cough. (Patient not taking: Reported on 08/20/2022), Disp: 20 capsule, Rfl: 0   diclofenac Sodium (VOLTAREN) 1 % GEL, Apply 4 g topically 4 (four) times daily., Disp: 50 g, Rfl: 0   ELIQUIS 2.5 MG TABS tablet, TAKE 1 TABLET BY MOUTH TWICE A DAY, Disp: 60 tablet, Rfl: 3   fluconazole (DIFLUCAN) 150 MG tablet, Take 1 tablet (150 mg total) by mouth daily. Take 1 tablet (150 mg) by mouth. Three days later, take second tablet., Disp: 2 tablet, Rfl: 0   KLOR-CON M20 20 MEQ tablet, TAKE 1 TABLET (20 MEQ TOTAL) BY MOUTH 2 (TWO) TIMES DAILY. DISSOLVE TABLETS, Disp: 60 tablet, Rfl: 2   levonorgestrel (MIRENA) 20 MCG/24HR IUD, 1 Intra Uterine Device (1 each total) by Intrauterine route once., Disp: 1 each, Rfl: 0   lidocaine-prilocaine (EMLA) cream, Apply to port site as directed, Disp: 30 g, Rfl: 3   LORazepam (ATIVAN) 0.5 MG tablet,  Take 1 tablet (0.5 mg total) by mouth every 8 (eight) hours., Disp: 30 tablet, Rfl: 0   magnesium hydroxide (MILK OF MAGNESIA) 400 MG/5ML suspension, Take 15 mLs by mouth daily as needed for moderate constipation. Take if no bowel movement for 2 days., Disp: , Rfl:    methocarbamol (ROBAXIN) 500 MG tablet, Take 1 tablet (500 mg total) by mouth 2 (two) times daily as needed for muscle spasms., Disp: 30 tablet, Rfl: 0   nystatin (MYCOSTATIN/NYSTOP) powder, Apply 1 Application topically 3 (three) times daily., Disp: 30 g, Rfl: 3   oxyCODONE (OXY IR/ROXICODONE) 5 MG immediate release tablet, Take 1 tablet (5 mg total) by mouth every 4 (four) hours as needed for severe pain., Disp: 60 tablet, Rfl: 0   pantoprazole (PROTONIX) 40 MG tablet, Take 1 tablet (40 mg total) by mouth daily., Disp: 30 tablet, Rfl: 2    polyethylene glycol (MIRALAX) 17 g packet, Take 17 g by mouth daily. To prevent constipation, Disp: 30 each, Rfl: 3   pregabalin (LYRICA) 75 MG capsule, Take 1 capsule (75 mg total) by mouth 2 (two) times daily., Disp: 60 capsule, Rfl: 1   prochlorperazine (COMPAZINE) 10 MG tablet, Take 1 tablet (10 mg total) by mouth every 6 (six) hours as needed for nausea or vomiting., Disp: 30 tablet, Rfl: 1   senna (SENOKOT) 8.6 MG TABS tablet, Take 1-2 tablets (8.6-17.2 mg total) by mouth daily. To prevent constipation, Disp: 120 tablet, Rfl: 0 No current facility-administered medications for this visit.  Facility-Administered Medications Ordered in Other Visits:    iron sucrose (VENOFER) 200 mg in sodium chloride 0.9 % 100 mL IVPB, 200 mg, Intravenous, Weekly, [name], MD, Stopped at 11/27/22 1111  Physical exam:  Vitals:   12/23/22 0931  BP: 123/82  Pulse: 71  Resp: 18  Temp: (!) 97.2 F (36.2 C)  TempSrc: Tympanic  SpO2: 99%  Weight: 288 lb 4.8 oz (130.8 kg)   Physical Exam Cardiovascular:     Rate and Rhythm: Normal rate and regular rhythm.     Heart sounds: Normal heart sounds.  Pulmonary:     Effort: Pulmonary effort is normal.     Breath sounds: Normal breath sounds.  Skin:    General: Skin is warm and dry.  Neurological:     Mental Status: She is alert and oriented to person, place, and time.         Latest Ref Rng & Units 12/23/2022    8:57 AM  CMP  Glucose 70 - 99 mg/dL 87   BUN 6 - 20 mg/dL 13   Creatinine 6.57 - 1.00 mg/dL 8.46   Sodium 962 - 952 mmol/L 137   Potassium 3.5 - 5.1 mmol/L 3.6   Chloride 98 - 111 mmol/L 105   CO2 22 - 32 mmol/L 26   Calcium 8.9 - 10.3 mg/dL 9.0   Total Protein 6.5 - 8.1 g/dL 7.5   Total Bilirubin 0.3 - 1.2 mg/dL 0.6   Alkaline Phos 38 - 126 U/L 63   AST 15 - 41 U/L 20   ALT 0 - 44 U/L 15       Latest Ref Rng & Units 12/23/2022    8:57 AM  CBC  WBC 4.0 - 10.5 K/uL 4.4   Hemoglobin 12.0 - 15.0 g/dL 84.1   Hematocrit 32.4 -  46.0 % 34.2   Platelets 150 - 400 K/uL 236      Assessment and plan- Patient is a 47 y.o. female  with history of stage IV endometrioid endometrial cancer T2 N2 M1 here for on treatment assessment prior to cycle 7 of palliative Keytruda  Counts okay to proceed with cycle 7 of palliative Keytruda today.  I will see her back in 6 weeks for cycle 8 with scans prior.  Plan is to do total 14 maintenance cycles.  Abdominal pain: Likely secondary to prior surgery overall improving with less reliance on oxycodone.  Continue to monitor  Chemo-induced peripheral neuropathy: Continue gabapentin   Visit Diagnosis 1. Encounter for antineoplastic immunotherapy   2. Endometrial cancer (HCC)   3. Chemotherapy-induced peripheral neuropathy (HCC)      Dr. Owens Shark, MD, MPH [facility] at [facility] [phone] 12/23/2022 9:46 AM

## 2022-12-24 ENCOUNTER — Ambulatory Visit: Payer: BC Managed Care – PPO | Admitting: Oncology

## 2022-12-24 ENCOUNTER — Ambulatory Visit: Payer: BC Managed Care – PPO

## 2022-12-24 ENCOUNTER — Other Ambulatory Visit: Payer: BC Managed Care – PPO

## 2022-12-30 ENCOUNTER — Ambulatory Visit: Payer: BC Managed Care – PPO | Attending: Obstetrics and Gynecology | Admitting: Physical Therapy

## 2022-12-30 ENCOUNTER — Encounter: Payer: Self-pay | Admitting: Physical Therapy

## 2022-12-30 DIAGNOSIS — R2689 Other abnormalities of gait and mobility: Secondary | ICD-10-CM | POA: Insufficient documentation

## 2022-12-30 DIAGNOSIS — R200 Anesthesia of skin: Secondary | ICD-10-CM | POA: Diagnosis not present

## 2022-12-30 DIAGNOSIS — E876 Hypokalemia: Secondary | ICD-10-CM | POA: Diagnosis not present

## 2022-12-30 DIAGNOSIS — M533 Sacrococcygeal disorders, not elsewhere classified: Secondary | ICD-10-CM | POA: Diagnosis not present

## 2022-12-30 DIAGNOSIS — G8929 Other chronic pain: Secondary | ICD-10-CM | POA: Diagnosis not present

## 2022-12-30 DIAGNOSIS — R278 Other lack of coordination: Secondary | ICD-10-CM | POA: Diagnosis not present

## 2022-12-30 DIAGNOSIS — D508 Other iron deficiency anemias: Secondary | ICD-10-CM | POA: Diagnosis not present

## 2022-12-30 DIAGNOSIS — M25562 Pain in left knee: Secondary | ICD-10-CM | POA: Diagnosis not present

## 2022-12-30 DIAGNOSIS — C541 Malignant neoplasm of endometrium: Secondary | ICD-10-CM | POA: Diagnosis not present

## 2022-12-30 NOTE — Patient Instructions (Signed)
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
## 2023-01-06 DIAGNOSIS — M25552 Pain in left hip: Secondary | ICD-10-CM | POA: Diagnosis not present

## 2023-01-06 DIAGNOSIS — M545 Low back pain, unspecified: Secondary | ICD-10-CM | POA: Diagnosis not present

## 2023-01-06 DIAGNOSIS — S39012A Strain of muscle, fascia and tendon of lower back, initial encounter: Secondary | ICD-10-CM | POA: Diagnosis not present

## 2023-01-07 ENCOUNTER — Ambulatory Visit: Payer: BC Managed Care – PPO | Admitting: Physical Therapy

## 2023-01-09 DIAGNOSIS — M545 Low back pain, unspecified: Secondary | ICD-10-CM | POA: Insufficient documentation

## 2023-01-14 ENCOUNTER — Inpatient Hospital Stay: Payer: BC Managed Care – PPO | Attending: Obstetrics and Gynecology | Admitting: Obstetrics and Gynecology

## 2023-01-14 ENCOUNTER — Ambulatory Visit: Payer: BC Managed Care – PPO | Admitting: Physical Therapy

## 2023-01-14 ENCOUNTER — Encounter: Payer: Self-pay | Admitting: Obstetrics and Gynecology

## 2023-01-14 VITALS — BP 126/77 | HR 78 | Temp 98.7°F | Resp 20 | Wt 293.8 lb

## 2023-01-14 DIAGNOSIS — C7951 Secondary malignant neoplasm of bone: Secondary | ICD-10-CM | POA: Insufficient documentation

## 2023-01-14 DIAGNOSIS — C772 Secondary and unspecified malignant neoplasm of intra-abdominal lymph nodes: Secondary | ICD-10-CM | POA: Insufficient documentation

## 2023-01-14 DIAGNOSIS — Z170 Estrogen receptor positive status [ER+]: Secondary | ICD-10-CM | POA: Diagnosis not present

## 2023-01-14 DIAGNOSIS — C541 Malignant neoplasm of endometrium: Secondary | ICD-10-CM | POA: Insufficient documentation

## 2023-01-14 DIAGNOSIS — Z86711 Personal history of pulmonary embolism: Secondary | ICD-10-CM | POA: Insufficient documentation

## 2023-01-14 DIAGNOSIS — Z90710 Acquired absence of both cervix and uterus: Secondary | ICD-10-CM | POA: Diagnosis not present

## 2023-01-14 DIAGNOSIS — Z90722 Acquired absence of ovaries, bilateral: Secondary | ICD-10-CM | POA: Insufficient documentation

## 2023-01-14 DIAGNOSIS — Z7901 Long term (current) use of anticoagulants: Secondary | ICD-10-CM | POA: Diagnosis not present

## 2023-01-14 DIAGNOSIS — N842 Polyp of vagina: Secondary | ICD-10-CM | POA: Diagnosis not present

## 2023-01-14 NOTE — Progress Notes (Addendum)
Gynecologic Oncology Interval Visit   Referring Provider: Dr. Logan Bores & Dr Cathie Hoops  Chief Complaint: Endometrial adenocarcinoma, stage IVB  Subjective:  Jean Morris is a 47 y.o. female who is seen in consultation from Dr. Logan Bores for irregular bleeding for many years, diagnosed with Stage IVb endometrioid endometrial cancer currently s/p 6 cycles of neoadjuvant carboplatin with paclitaxel and keytruda with single agent keytruda and TLH BSO at [facility] with Dr Johnnette Litter on 06/10/22, restaging PET June 2024 consistent with response, continues maintenance [place], who returns to clinic for pelvic exam.   09/09/22- PET for restaging. The aortacaval node measures 8 mm and a S.U.V. max of 3.8 on 82/4 versus 7 mm and a S.U.V. max of 3.5 on the prior exam. While this node is very small the SUV is concerning.  Given the findings below it is reasonable to complete pembrolizumab maintenance and reimage. Overall response to therapy is reassuring. If still positive on subsequent PET consider radiation therapy and then reassess.  Patient has no complaints today.  No bleeding, discharge, GI or GU issues.    Gynecologic Oncology History:  Patient presented to her OB/GYN for irregular bleeding for several months.  She was thought to have an IUD in place and her bleeding was attempted to be controlled with progesterone agents.  Medical history significant for PE in [facility] in 02/2015  12/25/21- CT C/A/P-  for left lower quadrant abdominal pain showed enlarged uterus with thickened endometrium, heterogeneous and irregular extending to the fundal myometrium.  Retroperitoneal and retrocrural adenopathy.  Faint lucent lesion in left L2 vertebrae suspicious for metastasis.  Posterior right lower lobe subpleural nodule measuring 10 mm with surrounding groundglass opacity; suspicious for metastasis though was present on 08/2015 CT but measured 5 mm.   Pelvic ultrasound- Uterus-19 x 13.3 x 15 cm = volume 1974 mls.  Distinct endometrium not  visible.  Heterogeneous area which may be surrounded by myometrium may be as much is 7 cm in greatest thickness. Endometrium-thickness, perhaps cystic is 75 mm.  Markedly heterogeneous with signs of internal flow. Right ovary-not visualized Left ovary-5.8 x 3.1 x 4.2 cm.  Simple dominant follicle measuring up to 3.5 cm accounts for much of the left ovarian dimension.  Pulse Doppler evaluation shows low resistance arterial and venous flow within the left ovary Other-small free fluid in the pelvis.  Endometrial biopsy- -endometrioid adenocarcinoma, FIGO grade 2  She was started on norethindrone by Dr. Logan Bores.   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 She was taking oral contraceptives at that time with increased dosing d/t heavy menstrual periods. Additionally, hx of iron deficiency and has taken oral iron. Given extent of disease and VTE neoaduvant therapy was recommended.   Treatment Summary:  01/21/22- D1C1- carboplatin 02/12/22- D1C2 carbo-paclitaxel-pembrolizumab 03/05/22- D1C3 carbo-paclitaxel-pembrolizumab  03/17/22- CT Chest Abdomen Pelvis W Contrast-  IMPRESSION: 1. Persistent heterogeneous irregularity of the endometrium with uterine enlargement compatible with known endometrial malignancy.  2. New heterogeneity of the 2.8 cm left ovarian lesion, nonspecific but possibly reflecting metastatic disease. Consider further evaluation with pelvic ultrasound. 3. Overall decreased retrocrural and retroperitoneal adenopathy. However, there is interval increase in size and adjacent infiltrative/desmoplastic stranding adjacent to a centrally necrotic left periaortic lymph node/lymph node conglomerate. 4. Continued decrease in size of the posterior right lower lobe pulmonary nodule. 5. New pathologic compression deformity of the L2 vertebral body. 6. Diffuse hepatic steatosis. 7. Mild diffuse bronchial wall thickening with mosaic attenuation of the lungs, suggestive of  small  airways disease.  01/21/22 D1C1 Carbo-paclitaxel-keytruda 02/12/22 D1C2 Carbo-paclitaxel-keytruda 03/05/22 D1C3 Carbo-paclitaxel-keytruda 03/26/22 D1C4 Carbo-paclitaxel-keytruda 04/16/22 D1C5 Carbo-paclitaxel-keytruda 05/07/2022-  D1C6 carbo-paclitaxel-pembrolizumab. 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 Zometa 4 mg added.  05/19/2022  PET - enlarged uterus is hypermetabolic endometrial thickening consistent with known endometrial cancer. Improving left periaortic nodal metastases. Addtiional small cervical, axillary, and inguinal nodes reflective of nodal metastases.   05/28/22-  D1C7 - pembrolizumab 06/10/22-  TLH BSO at [facility] with Dr. Johnnette Litter.  Pathology: A.  Uterus, cervix, bilateral fallopian tubes and ovaries, hysterectomy, bilateral salpingo-oophorectomy: Residual endometrioid adenocarcinoma of the endometrium (0.6 cm), FIGO grade 2 of 3, with marked treatment effect, invading 18 mm in a 34 mm thick myometrium. 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 Cervix: Negative for malignancy. Serosa: Negative malignancy. Right and left ovaries: Negative for malignancy. Right and left fallopian tubes: Negative malignancy. Microsatellite instability and mismatch repair protein immunohistochemistry: Not performed (obtained on prior specimen). P53 immunohistochemistry: Not performed A: Pelvic Washings: negative. No evidence of malignancy.   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  BRAF V600E not present. The tumor cells are NEGATIVE for Her2 (1+). Estrogen Receptor:       POSITIVE, 90%, MODERATE STAINING INTENSITY Progesterone Receptor:   POSITIVE, 80%, STRONG STAINING INTENSITY   Problem List: Patient Active Problem List   Diagnosis Date Noted   Hypokalemia 04/01/2022   Endometrial cancer (HCC) 01/15/2022   Iron deficiency anemia 01/15/2022   Morbid obesity with BMI of 60.0-69.9, adult (HCC) 04/18/2015   Abnormal uterine  bleeding 04/18/2015   Anemia 04/18/2015   Pulmonary embolism (HCC) 03/11/2015    Past Medical History: Past Medical History:  Diagnosis Date   Anemia    Endometrial cancer (HCC)    Hypertension    Menorrhagia    Pulmonary embolism (HCC)     Past Surgical History: Past Surgical History:  Procedure Laterality Date   ABDOMINAL HYSTERECTOMY Bilateral 06/10/2022   at [facility]   CESAREAN SECTION  02/25/1992   COLONOSCOPY WITH PROPOFOL N/A 08/06/2021   Procedure: COLONOSCOPY WITH PROPOFOL;  Surgeon: Wyline Mood, MD;  Location: [facility] ENDOSCOPY;  Service: Gastroenterology;  Laterality: N/A;   ESOPHAGOGASTRODUODENOSCOPY (EGD) WITH PROPOFOL N/A 02/09/2020   Procedure: ESOPHAGOGASTRODUODENOSCOPY (EGD) WITH PROPOFOL;  Surgeon: Wyline Mood, MD;  Location: [facility] ENDOSCOPY;  Service: Gastroenterology;  Laterality: N/A;   IR IMAGING GUIDED PORT INSERTION  01/17/2022    Past Gynecologic History:  Menarche: age 83, last 5 days History of OCP/HRT use: yes Last pap:  03/14/21- ASCUS, HPV negative History of STDs: The patient denies history of sexually transmitted disease. Sexually active: yes; not currently  OB History:  OB History  Gravida Para Term Preterm AB Living  3 3 3     3   SAB IAB Ectopic Multiple Live Births          3    # Outcome Date GA Lbr Len/2nd Weight Sex Type Anes PTL Lv  3 Term     M CS-LTranv   LIV  2 Term     M Vag-Spont   LIV  1 Term     F Vag-Spont   LIV   Family History: Family History  Problem Relation Age of Onset   Hypertension Mother    Stroke Mother    Heart attack Mother    Cancer Father    Hypertension Father    Cancer Sister    Cancer Sister    Cancer Brother    Hypertension Other  Social History: Social History   Socioeconomic History   Marital status: Media planner    Spouse name: Not on file   Number of children: Not on file   Years of education: Not on file   Highest education level: Not on file  Occupational History   Not on file   Tobacco Use   Smoking status: Never   Smokeless tobacco: Never  Vaping Use   Vaping status: Never Used  Substance and Sexual Activity   Alcohol use: No   Drug use: No   Sexual activity: Not Currently    Birth control/protection: I.U.D.    Comment: patient states MD said did not see in CT today 01/17/2022  Other Topics Concern   Not on file  Social History Narrative   Lives with [facility], partner, and no pets.   Social Determinants of Health   Financial Resource Strain: Not on file  Food Insecurity: Not on file  Transportation Needs: Not on file  Physical Activity: Not on file  Stress: Not on file  Social Connections: Not on file  Intimate Partner Violence: Not on file   Allergies: Allergies  Allergen Reactions   Bee Pollen Nausea And Vomiting    Itchy, swelling, watery eyes, runny nose.   Pollen Extract Nausea And Vomiting    Itchy, swelling, watery eyes, runny nose.   Current Medications: Current Outpatient Medications  Medication Sig Dispense Refill   diclofenac Sodium (VOLTAREN) 1 % GEL Apply 4 g topically 4 (four) times daily. 50 g 0   ELIQUIS 2.5 MG TABS tablet TAKE 1 TABLET BY MOUTH TWICE A DAY 60 tablet 3   fluconazole (DIFLUCAN) 150 MG tablet Take 1 tablet (150 mg total) by mouth daily. Take 1 tablet (150 mg) by mouth. Three days later, take second tablet. 2 tablet 0   KLOR-CON M20 20 MEQ tablet TAKE 1 TABLET (20 MEQ TOTAL) BY MOUTH 2 (TWO) TIMES DAILY. DISSOLVE TABLETS 60 tablet 2   levonorgestrel (MIRENA) 20 MCG/24HR IUD 1 Intra Uterine Device (1 each total) by Intrauterine route once. 1 each 0   lidocaine-prilocaine (EMLA) cream Apply to port site as directed 30 g 3   LORazepam (ATIVAN) 0.5 MG tablet Take 1 tablet (0.5 mg total) by mouth every 8 (eight) hours. 30 tablet 0   magnesium hydroxide (MILK OF MAGNESIA) 400 MG/5ML suspension Take 15 mLs by mouth daily as needed for moderate constipation. Take if no bowel movement for 2 days.     methocarbamol (ROBAXIN)  500 MG tablet Take 1 tablet (500 mg total) by mouth 2 (two) times daily as needed for muscle spasms. 30 tablet 0   nystatin (MYCOSTATIN/NYSTOP) powder Apply 1 Application topically 3 (three) times daily. 30 g 3   oxyCODONE (OXY IR/ROXICODONE) 5 MG immediate release tablet Take 1 tablet (5 mg total) by mouth every 4 (four) hours as needed for severe pain. 60 tablet 0   pantoprazole (PROTONIX) 40 MG tablet Take 1 tablet (40 mg total) by mouth daily. 30 tablet 2   polyethylene glycol (MIRALAX) 17 g packet Take 17 g by mouth daily. To prevent constipation 30 each 3   prochlorperazine (COMPAZINE) 10 MG tablet Take 1 tablet (10 mg total) by mouth every 6 (six) hours as needed for nausea or vomiting. 30 tablet 1   senna (SENOKOT) 8.6 MG TABS tablet Take 1-2 tablets (8.6-17.2 mg total) by mouth daily. To prevent constipation 120 tablet 0   benzonatate (TESSALON) 100 MG capsule Take 1 capsule (100 mg total)  by mouth 3 (three) times daily as needed for cough. (Patient not taking: Reported on 08/20/2022) 20 capsule 0   pregabalin (LYRICA) 75 MG capsule Take 1 capsule (75 mg total) by mouth 2 (two) times daily. (Patient not taking: Reported on 01/14/2023) 60 capsule 1   No current facility-administered medications for this visit.   Review of Systems General:  weakness Skin: no complaints Eyes: no complaints HEENT: no complaints Breasts: no complaints Pulmonary: no complaints Cardiac: no complaints Gastrointestinal: no complaints Genitourinary/Sexual: no complaints Ob/Gyn: no complaints Musculoskeletal: back pain Hematology: no complaints Neurologic/Psych: numbness tingling    Objective:  Physical Examination:  Vitals:   01/14/23 1017  BP: 126/77  Pulse: 78  Resp: 20  Temp: 98.7 F (37.1 C)  SpO2: 100%   Body mass index is 48.89 kg/m.  ECOG Performance Status: 0 - Asymptomatic  GENERAL: Patient is a well appearing female in no acute distress HEENT:  Sclera clear. Anicteric NODES:   Negative axillary, supraclavicular, inguinal lymph node survery LUNGS:  Clear to auscultation bilaterally.   HEART:  Regular rate and rhythm.  ABDOMEN:  Soft, nontender.  No hernias, incisions well healed. No masses or ascites EXTREMITIES:  No peripheral edema. Atraumatic. No cyanosis SKIN:  Clear with no obvious rashes or skin changes.  NEURO:  Nonfocal. Well oriented.  Appropriate affect.  Pelvic: Exam chaperoned by Nursing EGBUS: normal Vagina: cuff well healed. 6 mm polyp at left fornix, likely granulation tissue. Bimanual: normal  IMAGING:   PET scan 05/19/2022 05/19/2022 PET    COMPARISON:  CT chest abdomen pelvis dated 03/17/2022.   FINDINGS: Mediastinal blood pool activity: SUV max 2.8   Liver activity: SUV max NA   NECK: Small bilateral level 2 cervical nodes measuring up to 6 mm short axis (series 4/image 20), max SUV 3.8 on the left.   Incidental CT findings: None.   CHEST: Small right axillary nodes measuring up to 6 mm short axis inferiorly (series 4/image 53), max SUV 3.0.   Small left axillary/intramammary nodes measuring up to 8 mm short axis inferiorly (series 4/image 58), max SUV 4.9.   No hypermetabolic mediastinal lymphadenopathy.   No hypermetabolic pulmonary nodules.   Right chest port terminates at the cavoatrial junction.   Incidental CT findings: None.   ABDOMEN/PELVIS: Abnormal soft tissue in the left periaortic region measuring 1.6 x 2.9 cm (series 4/image 99), max SUV 3.1. Adjacent discrete 9 mm short axis left para-aortic node (series 4/image 99), max SUV 5.8. When compared to the prior, the dominant left periaortic nodal soft tissue has improved.   Small bilateral inferior inguinal nodes measuring up to 6 mm short axis on the left (series 4/image 153), max SUV 2.2.   Enlarged uterus with endometrial thickening, corresponding to the patient's known endometrial cancer. Associated central endometrial hypermetabolism, max SUV 4.7.    Incidental CT findings: None.   SKELETON: No focal hypermetabolic activity to suggest skeletal metastasis.   Incidental CT findings: Degenerative changes of the lumbar spine with known L2 compression fracture deformity.   IMPRESSION: Enlarged uterus with hypermetabolic endometrial thickening, corresponding to the patient's known endometrial cancer. Improving left periaortic nodal metastases, as above. Additional small cervical, axillary, and inguinal nodes likely reflect nodal metastases, although low-grade lymphoma is also possible.  Procedure note:  Patient signed informed consent for vaginal biopsy and time out performed.  Pain score 0.  Area of probable granulation tissue in left vaginal vault biopsied off and sent to pathology. Silver nitrate applied with good hemostasis.  She tolerated the procedure well.      Assessment:  Jean Morris is a 47 y.o. female with PMB, anemia and large uterus diagnosed with figo grade 2 endometrial cancer on endometrial biopsy 11/23.  Stage IVB based on suspicious lung lesion, L2 vertebral lesion, significantly enlarged left aortic adenopathy consistent with metastatic disease on imaging. Improved disease control s/p 6 cycles of chemotherapy with carbo/taxol and [place].  PET/CT 2/34 showed enlarged uterus with hypermetabolic endometrial thickening and no other PET avid lesions.  In view of this, underwent TLH BSO 06/06/22 for disease control with excellent recovery.  Pathology showed grade 2 endometrioid cancer with 50% invasion, cervix, adnexa and washings negative.  PET scan 7/24 equivoval activity in retroperitoneal nodes.  Continues on Keytruda maintenance q6wks.     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  BRAF V600E not present. Her2 (1+). Estrogen Receptor:       POSITIVE, 90%, MODERATE STAINING INTENSITY Progesterone Receptor:   POSITIVE, 80%, STRONG STAINING INTENSITY  History of PE currently on  Eliquis  Medical co-morbidities complicating care: HTN, Body mass index is 48.89 kg/m., prior surgery, PE while taking OCs. Plan:   Problem List Items Addressed This Visit       Genitourinary   Endometrial cancer (HCC) - Primary   Probable vaginal granulation tissue at the cuff biopsied off and will await path results to be sure this is not recurrent cancer.   Recommend continuing pembrolizumab maintenance with Dr Smith Robert her Medical Oncologist.  Repeat imaging per Dr Smith Robert next month.  Radiation therapy would be an option for additional treatment if there is concern for nodal disease in left PA node or other areas.  Would not necessarily radiate entire pelvic and aortic nodal area since she originally had adenopathy above that and it responded to systemic therapy.    If she develops progressive disease it would be an option to continue Pembrolizumab and add Lenvatanib.  In view of the endometrioid histology and positive ER/PR expression she could also be a candidate for a hormonal regimen such as alternating Megace and Tamoxifen or Everolimus/Letrozole.    She enrolled on the [employer] consortium study.   The patient's diagnosis, an outline of the further diagnostic and laboratory studies which will be required, the recommendation for surgery, and alternatives were discussed with her and her accompanying family members.  All questions were answered to their satisfaction.  Alinda Dooms, NP  I personally interviewed and examined the patient. Agreed with the above/below plan of care. I have directly contributed to assessment and plan of care of this patient and educated and discussed with patient and family.  Leida Lauth, MD

## 2023-01-15 ENCOUNTER — Other Ambulatory Visit: Payer: Self-pay

## 2023-01-16 LAB — SURGICAL PATHOLOGY

## 2023-01-21 ENCOUNTER — Ambulatory Visit: Payer: BC Managed Care – PPO | Admitting: Physical Therapy

## 2023-01-28 ENCOUNTER — Ambulatory Visit: Payer: BC Managed Care – PPO | Attending: Obstetrics and Gynecology | Admitting: Physical Therapy

## 2023-01-28 ENCOUNTER — Telehealth: Payer: Self-pay | Admitting: Physical Therapy

## 2023-01-28 NOTE — Telephone Encounter (Signed)
Physical therapist called pt re: missed appt today. Provided encouragement to keep up with [facility].  Left message that there is no charge to missed appts but there is a waiting list with other patients needing treatment at our clinic.    We would be grateful if pt can please call to confirm whether they wish to keep or cancel remaining appts [phone].  Remaining appts will be removed after 2 no-show appts per rehab department policy.   If pt wants to schedule more appts, they will be scheduled one at a time.

## 2023-02-02 ENCOUNTER — Encounter: Payer: Self-pay | Admitting: Oncology

## 2023-02-03 ENCOUNTER — Encounter: Payer: Self-pay | Admitting: Oncology

## 2023-02-04 ENCOUNTER — Other Ambulatory Visit: Payer: Self-pay

## 2023-02-04 ENCOUNTER — Inpatient Hospital Stay: Payer: BC Managed Care – PPO | Attending: Obstetrics and Gynecology

## 2023-02-04 ENCOUNTER — Ambulatory Visit: Payer: BC Managed Care – PPO | Admitting: Physical Therapy

## 2023-02-04 ENCOUNTER — Other Ambulatory Visit: Payer: Self-pay | Admitting: *Deleted

## 2023-02-04 ENCOUNTER — Inpatient Hospital Stay (HOSPITAL_BASED_OUTPATIENT_CLINIC_OR_DEPARTMENT_OTHER): Payer: BC Managed Care – PPO | Admitting: Oncology

## 2023-02-04 ENCOUNTER — Inpatient Hospital Stay: Payer: BC Managed Care – PPO

## 2023-02-04 ENCOUNTER — Encounter: Payer: Self-pay | Admitting: Oncology

## 2023-02-04 VITALS — BP 115/95 | HR 79 | Temp 97.7°F | Wt 295.0 lb

## 2023-02-04 DIAGNOSIS — I10 Essential (primary) hypertension: Secondary | ICD-10-CM | POA: Insufficient documentation

## 2023-02-04 DIAGNOSIS — Z86711 Personal history of pulmonary embolism: Secondary | ICD-10-CM | POA: Diagnosis not present

## 2023-02-04 DIAGNOSIS — R590 Localized enlarged lymph nodes: Secondary | ICD-10-CM | POA: Insufficient documentation

## 2023-02-04 DIAGNOSIS — R918 Other nonspecific abnormal finding of lung field: Secondary | ICD-10-CM | POA: Insufficient documentation

## 2023-02-04 DIAGNOSIS — Z809 Family history of malignant neoplasm, unspecified: Secondary | ICD-10-CM | POA: Diagnosis not present

## 2023-02-04 DIAGNOSIS — C541 Malignant neoplasm of endometrium: Secondary | ICD-10-CM

## 2023-02-04 DIAGNOSIS — Z791 Long term (current) use of non-steroidal anti-inflammatories (NSAID): Secondary | ICD-10-CM | POA: Insufficient documentation

## 2023-02-04 DIAGNOSIS — G8929 Other chronic pain: Secondary | ICD-10-CM | POA: Diagnosis not present

## 2023-02-04 DIAGNOSIS — R109 Unspecified abdominal pain: Secondary | ICD-10-CM | POA: Insufficient documentation

## 2023-02-04 DIAGNOSIS — Z5112 Encounter for antineoplastic immunotherapy: Secondary | ICD-10-CM

## 2023-02-04 DIAGNOSIS — C772 Secondary and unspecified malignant neoplasm of intra-abdominal lymph nodes: Secondary | ICD-10-CM | POA: Insufficient documentation

## 2023-02-04 DIAGNOSIS — Z90710 Acquired absence of both cervix and uterus: Secondary | ICD-10-CM | POA: Insufficient documentation

## 2023-02-04 DIAGNOSIS — Z90722 Acquired absence of ovaries, bilateral: Secondary | ICD-10-CM | POA: Diagnosis not present

## 2023-02-04 DIAGNOSIS — G620 Drug-induced polyneuropathy: Secondary | ICD-10-CM | POA: Insufficient documentation

## 2023-02-04 DIAGNOSIS — Z79899 Other long term (current) drug therapy: Secondary | ICD-10-CM | POA: Insufficient documentation

## 2023-02-04 LAB — CBC WITH DIFFERENTIAL/PLATELET
Abs Immature Granulocytes: 0.01 10*3/uL (ref 0.00–0.07)
Basophils Absolute: 0 10*3/uL (ref 0.0–0.1)
Basophils Relative: 1 %
Eosinophils Absolute: 0.2 10*3/uL (ref 0.0–0.5)
Eosinophils Relative: 4 %
HCT: 35 % — ABNORMAL LOW (ref 36.0–46.0)
Hemoglobin: 12.6 g/dL (ref 12.0–15.0)
Immature Granulocytes: 0 %
Lymphocytes Relative: 35 %
Lymphs Abs: 1.6 10*3/uL (ref 0.7–4.0)
MCH: 27.3 pg (ref 26.0–34.0)
MCHC: 36 g/dL (ref 30.0–36.0)
MCV: 75.9 fL — ABNORMAL LOW (ref 80.0–100.0)
Monocytes Absolute: 0.3 10*3/uL (ref 0.1–1.0)
Monocytes Relative: 7 %
Neutro Abs: 2.4 10*3/uL (ref 1.7–7.7)
Neutrophils Relative %: 53 %
Platelets: 219 10*3/uL (ref 150–400)
RBC: 4.61 MIL/uL (ref 3.87–5.11)
RDW: 15.9 % — ABNORMAL HIGH (ref 11.5–15.5)
WBC: 4.6 10*3/uL (ref 4.0–10.5)
nRBC: 0 % (ref 0.0–0.2)

## 2023-02-04 LAB — COMPREHENSIVE METABOLIC PANEL
ALT: 11 U/L (ref 0–44)
AST: 17 U/L (ref 15–41)
Albumin: 3.5 g/dL (ref 3.5–5.0)
Alkaline Phosphatase: 62 U/L (ref 38–126)
Anion gap: 6 (ref 5–15)
BUN: 14 mg/dL (ref 6–20)
CO2: 27 mmol/L (ref 22–32)
Calcium: 9.1 mg/dL (ref 8.9–10.3)
Chloride: 104 mmol/L (ref 98–111)
Creatinine, Ser: 0.8 mg/dL (ref 0.44–1.00)
GFR, Estimated: >60 mL/min (ref >60)
Glucose, Bld: 92 mg/dL (ref 70–99)
Potassium: 3.5 mmol/L (ref 3.5–5.1)
Sodium: 137 mmol/L (ref 135–145)
Total Bilirubin: 0.8 mg/dL (ref <1.2)
Total Protein: 7.2 g/dL (ref 6.5–8.1)

## 2023-02-04 LAB — TSH: TSH: 2.522 u[IU]/mL (ref 0.350–4.500)

## 2023-02-04 MED ORDER — SODIUM CHLORIDE 0.9 % IV SOLN
Freq: Once | INTRAVENOUS | Status: AC
  Filled 2023-02-04: qty 250

## 2023-02-04 MED ORDER — OXYCODONE HCL 5 MG PO TABS: 5.0000 mg | ORAL_TABLET | ORAL | 0 refills | Status: DC | PRN

## 2023-02-04 MED ORDER — DEXAMETHASONE SODIUM PHOSPHATE 10 MG/ML IJ SOLN
10.0000 mg | Freq: Once | INTRAMUSCULAR | Status: AC
  Administered 2023-02-04: 10 mg via INTRAVENOUS
  Filled 2023-02-04: qty 1

## 2023-02-04 MED ORDER — HEPARIN SOD (PORK) LOCK FLUSH 100 UNIT/ML IV SOLN
500.0000 [IU] | Freq: Once | INTRAVENOUS | Status: AC | PRN
  Administered 2023-02-04: 500 [IU]
  Filled 2023-02-04: qty 5

## 2023-02-04 MED ORDER — SODIUM CHLORIDE 0.9 % IV SOLN
400.0000 mg | Freq: Once | INTRAVENOUS | Status: AC
  Administered 2023-02-04: 400 mg via INTRAVENOUS
  Filled 2023-02-04: qty 16

## 2023-02-04 NOTE — Patient Instructions (Signed)
 [facility] - A DEPT OF [facility]  Discharge Instructions: Thank you for choosing [facility] to provide your oncology and hematology care.  If you have a lab appointment with the Cancer Center, please go directly to the Cancer Center and check in at the registration area.  Wear comfortable clothing and clothing appropriate for easy access to any Portacath or PICC line.   We strive to give you quality time with your provider. You may need to reschedule your appointment if you arrive late (15 or more minutes).  Arriving late affects you and other patients whose appointments are after yours.  Also, if you miss three or more appointments without notifying the office, you may be dismissed from the clinic at the provider's discretion.      For prescription refill requests, have your pharmacy contact our office and allow 72 hours for refills to be completed.    Today you received the following chemotherapy and/or immunotherapy agents Keytruda      To help prevent nausea and vomiting after your treatment, we encourage you to take your nausea medication as directed.  BELOW ARE SYMPTOMS THAT SHOULD BE REPORTED IMMEDIATELY: *FEVER GREATER THAN 100.4 F (38 C) OR HIGHER *CHILLS OR SWEATING *NAUSEA AND VOMITING THAT IS NOT CONTROLLED WITH YOUR NAUSEA MEDICATION *UNUSUAL SHORTNESS OF BREATH *UNUSUAL BRUISING OR BLEEDING *URINARY PROBLEMS (pain or burning when urinating, or frequent urination) *BOWEL PROBLEMS (unusual diarrhea, constipation, pain near the anus) TENDERNESS IN MOUTH AND THROAT WITH OR WITHOUT PRESENCE OF ULCERS (sore throat, sores in mouth, or a toothache) UNUSUAL RASH, SWELLING OR PAIN  UNUSUAL VAGINAL DISCHARGE OR ITCHING   Items with * indicate a potential emergency and should be followed up as soon as possible or go to the Emergency Department if any problems should occur.  Please show the CHEMOTHERAPY ALERT CARD or IMMUNOTHERAPY  ALERT CARD at check-in to the Emergency Department and triage nurse.  Should you have questions after your visit or need to cancel or reschedule your appointment, please contact [facility] - A DEPT OF [facility]  [phone] and follow the prompts.  Office hours are 8:00 a.m. to 4:30 p.m. Monday - Friday. Please note that voicemails left after 4:00 p.m. may not be returned until the following business day.  We are closed weekends and major holidays. You have access to a nurse at all times for urgent questions. Please call the main number to the clinic [phone] and follow the prompts.  For any non-urgent questions, you may also contact your provider using MyChart. We now offer e-Visits for anyone 42 and older to request care online for non-urgent symptoms. For details visit [email or web address].   Also download the MyChart app! Go to the app store, search "MyChart", open the app, select [place], and log in with your MyChart username and password.

## 2023-02-04 NOTE — Progress Notes (Signed)
Hematology/Oncology Consult note [facility]  Telephone:([phone] [phone]  Patient Care Team: Center, [facility] as PCP - General (General Practice) [name], RN as Oncology Nurse Navigator [name], MD as Medical Oncologist (Oncology)   Name of the patient: Jean Morris  [id number]  10-Jul-1975   Date of visit: 02/04/23  Diagnosis-  stage IVb endometrioid endometrial cancer   Chief complaint/ Reason for visit-on treatment assessment prior to cycle 8 of palliative Keytruda  Heme/Onc history: Patient is a 47 year old female who has seen Dr. Logan Bores for irregular menstrual bleeding.  He has had an IUD in place and initially it was attribute it to use of IUD and was tried to be controlled with progesterone agents.  However due to worsening abdominal pain patient underwent a CT abdomen and pelvis with contrast on 12/25/2021 which showed 8 mm   Posterior lung base mass.  Retroperitoneal adenopathy measuring 2.6 cm additional retrocrural adenopathy.  Faint lucent lesion involving L2 vertebral body.  The uterus is anteverted and enlarged.  Endometrium is enlarged heterogeneous and irregular extending to the fundal myometrium.  Findings consistent with endometrial malignancy.  CT chest without contrast showed a 10 mm right lower lobe subpleural nodule.   Patient had endometrial biopsy which was consistent FIGO grade 2 endometrioid endometrial carcinoma.  Patient was seen by GYN oncology and hopes to take her for surgery if she has good response to neoadjuvant chemotherapy.MSI unstable.  BRAF negative.  Loss of major and minor MMR proteins MLH1 and PMS2 less than 5% of tumor expression.  MLH1 hyper methylation present.  This is most likely due to somatic epigenetic modification which can be seen in about 77% of sporadic endometrial cancers.   Patient completed 6 cycles of CarboTaxol Keytruda chemotherapy and is currently on  maintenance Keytruda.  PET CT scan on 05/19/2022 showed enlarged uterus with hypermetabolic endometrial thickening.  Improving left periaortic nodal metastases.  No definitive bone metastases was noted.  Plan is to proceed with cytoreductive surgery at [facility].  Patient underwent hysterectomy and bilateral salpingo-oophorectomy on 06/10/2022.  Final pathology showed residual endometrioid adenocarcinoma of the endometrium FIGO grade 2 with marked treatment effect invading 18 mm and a 34 mm thick myometrium.  Fallopian tubes and ovaries were negative for malignancy.  Pelvic washings were negative for malignancy.   Patient is currently on maintenance Keytruda and plan is to do 14 maintenance cycles    Interval history-she has mild baseline neuropathy in her feet but did not feel that the gabapentin was helping her and therefore stopped taking it.  Occasional abdominal pain which comes and goes and is self-limited.  ECOG PS- 1 Pain scale- 0   Review of systems- Review of Systems  Constitutional:  Negative for chills, fever, malaise/fatigue and weight loss.  HENT:  Negative for congestion, ear discharge and nosebleeds.   Eyes:  Negative for blurred vision.  Respiratory:  Negative for cough, hemoptysis, sputum production, shortness of breath and wheezing.   Cardiovascular:  Negative for chest pain, palpitations, orthopnea and claudication.  Gastrointestinal:  Negative for abdominal pain, blood in stool, constipation, diarrhea, heartburn, melena, nausea and vomiting.  Genitourinary:  Negative for dysuria, flank pain, frequency, hematuria and urgency.  Musculoskeletal:  Negative for back pain, joint pain and myalgias.  Skin:  Negative for rash.  Neurological:  Negative for dizziness, tingling, focal weakness, seizures, weakness and headaches.  Endo/Heme/Allergies:  Does not bruise/bleed easily.  Psychiatric/Behavioral:  Negative for depression  and suicidal ideas. The patient does not have insomnia.        Allergies  Allergen Reactions   Bee Pollen Nausea And Vomiting    Itchy, swelling, watery eyes, runny nose.   Pollen Extract Nausea And Vomiting    Itchy, swelling, watery eyes, runny nose.     Past Medical History:  Diagnosis Date   Anemia    Endometrial cancer (HCC)    Hypertension    Menorrhagia    Pulmonary embolism [facility])      Past Surgical History:  Procedure Laterality Date   ABDOMINAL HYSTERECTOMY Bilateral 06/10/2022   at [facility]   CESAREAN SECTION  02/25/1992   COLONOSCOPY WITH PROPOFOL N/A 08/06/2021   Procedure: COLONOSCOPY WITH PROPOFOL;  Surgeon: Wyline Mood, MD;  Location: [facility] ENDOSCOPY;  Service: Gastroenterology;  Laterality: N/A;   ESOPHAGOGASTRODUODENOSCOPY (EGD) WITH PROPOFOL N/A 02/09/2020   Procedure: ESOPHAGOGASTRODUODENOSCOPY (EGD) WITH PROPOFOL;  Surgeon: Wyline Mood, MD;  Location: [facility] ENDOSCOPY;  Service: Gastroenterology;  Laterality: N/A;   IR IMAGING GUIDED PORT INSERTION  01/17/2022    Social History   Socioeconomic History   Marital status: Media planner    Spouse name: Not on file   Number of children: Not on file   Years of education: Not on file   Highest education level: Not on file  Occupational History   Not on file  Tobacco Use   Smoking status: Never   Smokeless tobacco: Never  Vaping Use   Vaping status: Never Used  Substance and Sexual Activity   Alcohol use: No   Drug use: No   Sexual activity: Not Currently    Birth control/protection: I.U.D.    Comment: patient states MD said did not see in CT today 01/17/2022  Other Topics Concern   Not on file  Social History Narrative   Lives with [facility], partner, and no pets.   Social Determinants of Health   Financial Resource Strain: Not on file  Food Insecurity: Not on file  Transportation Needs: Not on file  Physical Activity: Not on file  Stress: Not on file  Social Connections: Not on file  Intimate Partner Violence: Not on file    Family History   Problem Relation Age of Onset   Hypertension Mother    Stroke Mother    Heart attack Mother    Cancer Father    Hypertension Father    Cancer Sister    Cancer Sister    Cancer Brother    Hypertension Other      Current Outpatient Medications:    diclofenac Sodium (VOLTAREN) 1 % GEL, Apply 4 g topically 4 (four) times daily., Disp: 50 g, Rfl: 0   ELIQUIS 2.5 MG TABS tablet, TAKE 1 TABLET BY MOUTH TWICE A DAY, Disp: 60 tablet, Rfl: 3   fluconazole (DIFLUCAN) 150 MG tablet, Take 1 tablet (150 mg total) by mouth daily. Take 1 tablet (150 mg) by mouth. Three days later, take second tablet., Disp: 2 tablet, Rfl: 0   KLOR-CON M20 20 MEQ tablet, TAKE 1 TABLET (20 MEQ TOTAL) BY MOUTH 2 (TWO) TIMES DAILY. DISSOLVE TABLETS, Disp: 60 tablet, Rfl: 2   levonorgestrel (MIRENA) 20 MCG/24HR IUD, 1 Intra Uterine Device (1 each total) by Intrauterine route once., Disp: 1 each, Rfl: 0   lidocaine-prilocaine (EMLA) cream, Apply to port site as directed, Disp: 30 g, Rfl: 3   LORazepam (ATIVAN) 0.5 MG tablet, Take 1 tablet (0.5 mg total) by mouth every 8 (eight) hours., Disp: 30 tablet, Rfl:  0   magnesium hydroxide (MILK OF MAGNESIA) 400 MG/5ML suspension, Take 15 mLs by mouth daily as needed for moderate constipation. Take if no bowel movement for 2 days., Disp: , Rfl:    methocarbamol (ROBAXIN) 500 MG tablet, Take 1 tablet (500 mg total) by mouth 2 (two) times daily as needed for muscle spasms., Disp: 30 tablet, Rfl: 0   nystatin (MYCOSTATIN/NYSTOP) powder, Apply 1 Application topically 3 (three) times daily., Disp: 30 g, Rfl: 3   oxyCODONE (OXY IR/ROXICODONE) 5 MG immediate release tablet, Take 1 tablet (5 mg total) by mouth every 4 (four) hours as needed for severe pain (pain score 7-10)., Disp: 60 tablet, Rfl: 0   pantoprazole (PROTONIX) 40 MG tablet, Take 1 tablet (40 mg total) by mouth daily., Disp: 30 tablet, Rfl: 2   polyethylene glycol (MIRALAX) 17 g packet, Take 17 g by mouth daily. To prevent  constipation, Disp: 30 each, Rfl: 3   prochlorperazine (COMPAZINE) 10 MG tablet, Take 1 tablet (10 mg total) by mouth every 6 (six) hours as needed for nausea or vomiting., Disp: 30 tablet, Rfl: 1   senna (SENOKOT) 8.6 MG TABS tablet, Take 1-2 tablets (8.6-17.2 mg total) by mouth daily. To prevent constipation, Disp: 120 tablet, Rfl: 0   benzonatate (TESSALON) 100 MG capsule, Take 1 capsule (100 mg total) by mouth 3 (three) times daily as needed for cough. (Patient not taking: Reported on 08/20/2022), Disp: 20 capsule, Rfl: 0   pregabalin (LYRICA) 75 MG capsule, Take 1 capsule (75 mg total) by mouth 2 (two) times daily. (Patient not taking: Reported on 01/14/2023), Disp: 60 capsule, Rfl: 1  Physical exam:  Vitals:   02/04/23 0905  BP: (!) 115/95  Pulse: 79  Temp: 97.7 F (36.5 C)  TempSrc: Tympanic  SpO2: 99%  Weight: 295 lb (133.8 kg)   Physical Exam Cardiovascular:     Rate and Rhythm: Normal rate and regular rhythm.     Heart sounds: Normal heart sounds.  Pulmonary:     Effort: Pulmonary effort is normal.     Breath sounds: Normal breath sounds.  Abdominal:     General: Bowel sounds are normal.     Palpations: Abdomen is soft.  Skin:    General: Skin is warm and dry.  Neurological:     Mental Status: She is alert and oriented to person, place, and time.         Latest Ref Rng & Units 12/23/2022    8:57 AM  CMP  Glucose 70 - 99 mg/dL 87   BUN 6 - 20 mg/dL 13   Creatinine 4.09 - 1.00 mg/dL 8.11   Sodium 914 - 782 mmol/L 137   Potassium 3.5 - 5.1 mmol/L 3.6   Chloride 98 - 111 mmol/L 105   CO2 22 - 32 mmol/L 26   Calcium 8.9 - 10.3 mg/dL 9.0   Total Protein 6.5 - 8.1 g/dL 7.5   Total Bilirubin 0.3 - 1.2 mg/dL 0.6   Alkaline Phos 38 - 126 U/L 63   AST 15 - 41 U/L 20   ALT 0 - 44 U/L 15       Latest Ref Rng & Units 12/23/2022    8:57 AM  CBC  WBC 4.0 - 10.5 K/uL 4.4   Hemoglobin 12.0 - 15.0 g/dL 95.6   Hematocrit 21.3 - 46.0 % 34.2   Platelets 150 - 400 K/uL  236      Assessment and plan- Patient is a 47 y.o. female  with history of stage IV endometrioid endometrial cancer T2 N2 M1.  She is here for on treatment assessment prior to cycle 8 of palliative Keytruda  Counts okay to proceed with cycle 8 of palliative Keytruda today.  I will see her back in 6 weeks for cycle 9.  Plan is to complete 14 cycles.  Baseline TSH is normal.  We will be getting CT scans in the next 1 to 2 weeks  Chemo induced peripheral neuropathy: I have encouraged her to restart gabapentin and increase the dose if need be to twice a day or 3 times a day to see if her symptoms are better  Abdominal pain: Chronic likely secondary to prior surgery.  I have refilled her oxycodone today but I am going to tell her to keep cutting down on the usage and the next oxycodone prescription will be given after about 10 weeks with a plan to eventually stop using oxycodone in 4 to 5 months time.   Visit Diagnosis 1. Endometrial cancer (HCC)   2. Encounter for antineoplastic immunotherapy      Dr. Owens Shark, MD, MPH [facility] at [facility] [phone] 02/04/2023 9:05 AM

## 2023-02-09 ENCOUNTER — Ambulatory Visit
Admission: RE | Admit: 2023-02-09 | Discharge: 2023-02-09 | Disposition: A | Discharge status: Home / Self Care | Payer: BC Managed Care – PPO | Source: Ambulatory Visit | Attending: Oncology | Admitting: Oncology

## 2023-02-09 DIAGNOSIS — C541 Malignant neoplasm of endometrium: Secondary | ICD-10-CM | POA: Diagnosis not present

## 2023-02-09 DIAGNOSIS — I700 Atherosclerosis of aorta: Secondary | ICD-10-CM | POA: Diagnosis not present

## 2023-02-09 DIAGNOSIS — Z90710 Acquired absence of both cervix and uterus: Secondary | ICD-10-CM | POA: Diagnosis not present

## 2023-02-09 DIAGNOSIS — R590 Localized enlarged lymph nodes: Secondary | ICD-10-CM | POA: Diagnosis not present

## 2023-02-09 MED ORDER — IOHEXOL 300 MG/ML  SOLN
100.0000 mL | Freq: Once | INTRAMUSCULAR | Status: AC | PRN
  Administered 2023-02-09: 100 mL via INTRAVENOUS

## 2023-02-10 ENCOUNTER — Telehealth: Payer: Self-pay

## 2023-02-10 ENCOUNTER — Other Ambulatory Visit: Payer: Self-pay | Admitting: Oncology

## 2023-02-10 MED ORDER — GABAPENTIN 300 MG PO CAPS: 300.0000 mg | ORAL_CAPSULE | Freq: Three times a day (TID) | ORAL | 0 refills | Status: DC

## 2023-02-10 NOTE — Telephone Encounter (Signed)
Notified patient FMLA forms ready for pickup and may require her signature. She asks to go ahead and fax because "they dont normally  ask for signature". Will leave on desk if patient wants to pick them up

## 2023-02-11 ENCOUNTER — Ambulatory Visit: Payer: BC Managed Care – PPO | Admitting: Physical Therapy

## 2023-02-11 ENCOUNTER — Encounter: Payer: Self-pay | Admitting: Oncology

## 2023-02-19 ENCOUNTER — Encounter: Payer: Self-pay | Admitting: *Deleted

## 2023-02-23 ENCOUNTER — Other Ambulatory Visit: Payer: Self-pay | Admitting: *Deleted

## 2023-02-23 DIAGNOSIS — R300 Dysuria: Secondary | ICD-10-CM

## 2023-02-24 ENCOUNTER — Inpatient Hospital Stay: Payer: BC Managed Care – PPO

## 2023-02-24 DIAGNOSIS — R109 Unspecified abdominal pain: Secondary | ICD-10-CM | POA: Diagnosis not present

## 2023-02-24 DIAGNOSIS — G620 Drug-induced polyneuropathy: Secondary | ICD-10-CM | POA: Diagnosis not present

## 2023-02-24 DIAGNOSIS — R590 Localized enlarged lymph nodes: Secondary | ICD-10-CM | POA: Diagnosis not present

## 2023-02-24 DIAGNOSIS — Z791 Long term (current) use of non-steroidal anti-inflammatories (NSAID): Secondary | ICD-10-CM | POA: Diagnosis not present

## 2023-02-24 DIAGNOSIS — C772 Secondary and unspecified malignant neoplasm of intra-abdominal lymph nodes: Secondary | ICD-10-CM | POA: Diagnosis not present

## 2023-02-24 DIAGNOSIS — I10 Essential (primary) hypertension: Secondary | ICD-10-CM | POA: Diagnosis not present

## 2023-02-24 DIAGNOSIS — R300 Dysuria: Secondary | ICD-10-CM

## 2023-02-24 DIAGNOSIS — Z5112 Encounter for antineoplastic immunotherapy: Secondary | ICD-10-CM | POA: Diagnosis not present

## 2023-02-24 DIAGNOSIS — Z809 Family history of malignant neoplasm, unspecified: Secondary | ICD-10-CM | POA: Diagnosis not present

## 2023-02-24 DIAGNOSIS — R918 Other nonspecific abnormal finding of lung field: Secondary | ICD-10-CM | POA: Diagnosis not present

## 2023-02-24 DIAGNOSIS — Z90710 Acquired absence of both cervix and uterus: Secondary | ICD-10-CM | POA: Diagnosis not present

## 2023-02-24 DIAGNOSIS — C541 Malignant neoplasm of endometrium: Secondary | ICD-10-CM | POA: Diagnosis not present

## 2023-02-24 DIAGNOSIS — G8929 Other chronic pain: Secondary | ICD-10-CM | POA: Diagnosis not present

## 2023-02-24 DIAGNOSIS — Z79899 Other long term (current) drug therapy: Secondary | ICD-10-CM | POA: Diagnosis not present

## 2023-02-24 DIAGNOSIS — Z86711 Personal history of pulmonary embolism: Secondary | ICD-10-CM | POA: Diagnosis not present

## 2023-02-24 DIAGNOSIS — Z90722 Acquired absence of ovaries, bilateral: Secondary | ICD-10-CM | POA: Diagnosis not present

## 2023-02-24 LAB — URINALYSIS, COMPLETE (UACMP) WITH MICROSCOPIC
Bilirubin Urine: NEGATIVE
Glucose, UA: NEGATIVE mg/dL
Hgb urine dipstick: NEGATIVE
Ketones, ur: NEGATIVE mg/dL
Nitrite: NEGATIVE
Protein, ur: NEGATIVE mg/dL
Specific Gravity, Urine: 1.01 (ref 1.005–1.030)
pH: 7 (ref 5.0–8.0)

## 2023-02-25 LAB — URINE CULTURE: Culture: NO GROWTH

## 2023-03-01 ENCOUNTER — Telehealth: Payer: Self-pay | Admitting: *Deleted

## 2023-03-01 NOTE — Telephone Encounter (Signed)
 Pt has been out of eliquis  and Friday 02/27/2023. She went to the pharmacy and it was going to pay to much money. She got help last year and so I called her pharmacy and the staff said that If I can get her co pay card then the has the meds and she can get the med if it is approved. The pt was told to see the eliquis  telephone number [phone]. I told her that they will ask about why she needs the drug, you have insurance and she does not enough for her to afford the the med. . 1 hour later she called me back and she got approval and I called back to pharmacy and gave the bin number, grp number PCN number and the pt gave me the card # [id number]. It is good for 1 year. Patient aware of all above  This was done 1/3, bu I did not have access to epic after hours

## 2023-03-02 ENCOUNTER — Encounter: Payer: Self-pay | Admitting: Oncology

## 2023-03-04 ENCOUNTER — Ambulatory Visit: Payer: BC Managed Care – PPO | Attending: Obstetrics and Gynecology | Admitting: Physical Therapy

## 2023-03-05 ENCOUNTER — Telehealth: Payer: Self-pay | Admitting: Physical Therapy

## 2023-03-05 NOTE — Telephone Encounter (Signed)
 Physical therapist called pt re: missed appt today which is her 2nd no show appt. Explained clinic policy that remaining appts are removed after 2 no-show appts.   If pt wants to schedule more appts, they will be scheduled one at a time.

## 2023-03-10 ENCOUNTER — Encounter: Payer: Self-pay | Admitting: Oncology

## 2023-03-11 ENCOUNTER — Ambulatory Visit: Payer: BC Managed Care – PPO | Admitting: Physical Therapy

## 2023-03-12 DIAGNOSIS — M5416 Radiculopathy, lumbar region: Secondary | ICD-10-CM | POA: Diagnosis not present

## 2023-03-18 ENCOUNTER — Encounter: Payer: Self-pay | Admitting: Oncology

## 2023-03-18 ENCOUNTER — Inpatient Hospital Stay (HOSPITAL_BASED_OUTPATIENT_CLINIC_OR_DEPARTMENT_OTHER): Payer: BC Managed Care – PPO | Admitting: Oncology

## 2023-03-18 ENCOUNTER — Inpatient Hospital Stay: Payer: BC Managed Care – PPO | Attending: Obstetrics and Gynecology

## 2023-03-18 ENCOUNTER — Other Ambulatory Visit: Payer: Self-pay | Admitting: Hospice and Palliative Medicine

## 2023-03-18 ENCOUNTER — Inpatient Hospital Stay: Payer: BC Managed Care – PPO

## 2023-03-18 ENCOUNTER — Ambulatory Visit: Payer: BC Managed Care – PPO | Admitting: Physical Therapy

## 2023-03-18 VITALS — BP 111/63 | HR 73 | Temp 98.4°F | Resp 17 | Wt 309.0 lb

## 2023-03-18 DIAGNOSIS — D649 Anemia, unspecified: Secondary | ICD-10-CM | POA: Insufficient documentation

## 2023-03-18 DIAGNOSIS — Z7901 Long term (current) use of anticoagulants: Secondary | ICD-10-CM | POA: Diagnosis not present

## 2023-03-18 DIAGNOSIS — Z86711 Personal history of pulmonary embolism: Secondary | ICD-10-CM | POA: Insufficient documentation

## 2023-03-18 DIAGNOSIS — C541 Malignant neoplasm of endometrium: Secondary | ICD-10-CM

## 2023-03-18 DIAGNOSIS — C7951 Secondary malignant neoplasm of bone: Secondary | ICD-10-CM | POA: Diagnosis not present

## 2023-03-18 DIAGNOSIS — Z5112 Encounter for antineoplastic immunotherapy: Secondary | ICD-10-CM | POA: Diagnosis not present

## 2023-03-18 DIAGNOSIS — Z791 Long term (current) use of non-steroidal anti-inflammatories (NSAID): Secondary | ICD-10-CM | POA: Diagnosis not present

## 2023-03-18 DIAGNOSIS — Z79899 Other long term (current) drug therapy: Secondary | ICD-10-CM | POA: Diagnosis not present

## 2023-03-18 DIAGNOSIS — S32020D Wedge compression fracture of second lumbar vertebra, subsequent encounter for fracture with routine healing: Secondary | ICD-10-CM

## 2023-03-18 DIAGNOSIS — N920 Excessive and frequent menstruation with regular cycle: Secondary | ICD-10-CM | POA: Insufficient documentation

## 2023-03-18 DIAGNOSIS — I10 Essential (primary) hypertension: Secondary | ICD-10-CM | POA: Insufficient documentation

## 2023-03-18 LAB — CBC WITH DIFFERENTIAL/PLATELET
Abs Immature Granulocytes: 0.03 10*3/uL (ref 0.00–0.07)
Basophils Absolute: 0 10*3/uL (ref 0.0–0.1)
Basophils Relative: 1 %
Eosinophils Absolute: 0.2 10*3/uL (ref 0.0–0.5)
Eosinophils Relative: 4 %
HCT: 34.8 % — ABNORMAL LOW (ref 36.0–46.0)
Hemoglobin: 12.7 g/dL (ref 12.0–15.0)
Immature Granulocytes: 1 %
Lymphocytes Relative: 36 %
Lymphs Abs: 1.7 10*3/uL (ref 0.7–4.0)
MCH: 27.9 pg (ref 26.0–34.0)
MCHC: 36.5 g/dL — ABNORMAL HIGH (ref 30.0–36.0)
MCV: 76.5 fL — ABNORMAL LOW (ref 80.0–100.0)
Monocytes Absolute: 0.3 10*3/uL (ref 0.1–1.0)
Monocytes Relative: 6 %
Neutro Abs: 2.5 10*3/uL (ref 1.7–7.7)
Neutrophils Relative %: 52 %
Platelets: 228 10*3/uL (ref 150–400)
RBC: 4.55 MIL/uL (ref 3.87–5.11)
RDW: 14.6 % (ref 11.5–15.5)
WBC: 4.7 10*3/uL (ref 4.0–10.5)
nRBC: 0 % (ref 0.0–0.2)

## 2023-03-18 LAB — COMPREHENSIVE METABOLIC PANEL
ALT: 13 U/L (ref 0–44)
AST: 16 U/L (ref 15–41)
Albumin: 3.6 g/dL (ref 3.5–5.0)
Alkaline Phosphatase: 67 U/L (ref 38–126)
Anion gap: 8 (ref 5–15)
BUN: 17 mg/dL (ref 6–20)
CO2: 27 mmol/L (ref 22–32)
Calcium: 9.3 mg/dL (ref 8.9–10.3)
Chloride: 104 mmol/L (ref 98–111)
Creatinine, Ser: 0.81 mg/dL (ref 0.44–1.00)
GFR, Estimated: >60 mL/min (ref >60)
Glucose, Bld: 93 mg/dL (ref 70–99)
Potassium: 4 mmol/L (ref 3.5–5.1)
Sodium: 139 mmol/L (ref 135–145)
Total Bilirubin: 0.6 mg/dL (ref 0.0–1.2)
Total Protein: 7.3 g/dL (ref 6.5–8.1)

## 2023-03-18 MED ORDER — HEPARIN SOD (PORK) LOCK FLUSH 100 UNIT/ML IV SOLN
500.0000 [IU] | Freq: Once | INTRAVENOUS | Status: DC | PRN
Start: 2023-03-18 — End: 2023-03-18
  Filled 2023-03-18: qty 5

## 2023-03-18 MED ORDER — DEXAMETHASONE SODIUM PHOSPHATE 10 MG/ML IJ SOLN
10.0000 mg | Freq: Once | INTRAMUSCULAR | Status: AC
  Administered 2023-03-18: 10 mg via INTRAVENOUS
  Filled 2023-03-18: qty 1

## 2023-03-18 MED ORDER — PEMBROLIZUMAB CHEMO INJECTION 100 MG/4ML
400.0000 mg | Freq: Once | INTRAVENOUS | Status: AC
  Administered 2023-03-18: 400 mg via INTRAVENOUS
  Filled 2023-03-18: qty 16

## 2023-03-18 MED ORDER — SODIUM CHLORIDE 0.9 % IV SOLN
Freq: Once | INTRAVENOUS | Status: AC
  Filled 2023-03-18: qty 250

## 2023-03-18 NOTE — Progress Notes (Signed)
At Dr. Assunta Gambles request, I spoke with IR regarding possible L2 kyphoplasty/RFA. Will refer to IR for further management.

## 2023-03-18 NOTE — Patient Instructions (Signed)
 [facility] - A DEPT OF [facility]  Discharge Instructions: Thank you for choosing [facility] to provide your oncology and hematology care.  If you have a lab appointment with the Cancer Center, please go directly to the Cancer Center and check in at the registration area.  Wear comfortable clothing and clothing appropriate for easy access to any Portacath or PICC line.   We strive to give you quality time with your provider. You may need to reschedule your appointment if you arrive late (15 or more minutes).  Arriving late affects you and other patients whose appointments are after yours.  Also, if you miss three or more appointments without notifying the office, you may be dismissed from the clinic at the provider's discretion.      For prescription refill requests, have your pharmacy contact our office and allow 72 hours for refills to be completed.    Today you received the following chemotherapy and/or immunotherapy agents Keytruda      To help prevent nausea and vomiting after your treatment, we encourage you to take your nausea medication as directed.  BELOW ARE SYMPTOMS THAT SHOULD BE REPORTED IMMEDIATELY: *FEVER GREATER THAN 100.4 F (38 C) OR HIGHER *CHILLS OR SWEATING *NAUSEA AND VOMITING THAT IS NOT CONTROLLED WITH YOUR NAUSEA MEDICATION *UNUSUAL SHORTNESS OF BREATH *UNUSUAL BRUISING OR BLEEDING *URINARY PROBLEMS (pain or burning when urinating, or frequent urination) *BOWEL PROBLEMS (unusual diarrhea, constipation, pain near the anus) TENDERNESS IN MOUTH AND THROAT WITH OR WITHOUT PRESENCE OF ULCERS (sore throat, sores in mouth, or a toothache) UNUSUAL RASH, SWELLING OR PAIN  UNUSUAL VAGINAL DISCHARGE OR ITCHING   Items with * indicate a potential emergency and should be followed up as soon as possible or go to the Emergency Department if any problems should occur.  Please show the CHEMOTHERAPY ALERT CARD or IMMUNOTHERAPY  ALERT CARD at check-in to the Emergency Department and triage nurse.  Should you have questions after your visit or need to cancel or reschedule your appointment, please contact [facility] - A DEPT OF [facility]  [phone] and follow the prompts.  Office hours are 8:00 a.m. to 4:30 p.m. Monday - Friday. Please note that voicemails left after 4:00 p.m. may not be returned until the following business day.  We are closed weekends and major holidays. You have access to a nurse at all times for urgent questions. Please call the main number to the clinic [phone] and follow the prompts.  For any non-urgent questions, you may also contact your provider using MyChart. We now offer e-Visits for anyone 42 and older to request care online for non-urgent symptoms. For details visit [email or web address].   Also download the MyChart app! Go to the app store, search "MyChart", open the app, select [place], and log in with your MyChart username and password.

## 2023-03-18 NOTE — Progress Notes (Signed)
I connected with Jean Morris on 03/18/23 at  8:45 AM EST by video enabled telemedicine visit and verified that I am speaking with the correct person using two identifiers.   I discussed the limitations, risks, security and privacy concerns of performing an evaluation and management service by telemedicine and the availability of in-person appointments. I also discussed with the patient that there may be a patient responsible charge related to this service. The patient expressed understanding and agreed to proceed.  Other persons participating in the visit and their role in the encounter:  none  Patient's location:  cancer center Provider's location:  home   Diagnosis-metastatic stage IVb endometrioid endometrial cancer  Chief complaint/ Reason for visit-on treatment assessment prior to cycle 9 of palliative Keytruda  Heme/Onc history: Patient is a 48 year old female who has seen Dr. Logan Bores for irregular menstrual bleeding.  He has had an IUD in place and initially it was attribute it to use of IUD and was tried to be controlled with progesterone agents.  However due to worsening abdominal pain patient underwent a CT abdomen and pelvis with contrast on 12/25/2021 which showed 8 mm   Posterior lung base mass.  Retroperitoneal adenopathy measuring 2.6 cm additional retrocrural adenopathy.  Faint lucent lesion involving L2 vertebral body.  The uterus is anteverted and enlarged.  Endometrium is enlarged heterogeneous and irregular extending to the fundal myometrium.  Findings consistent with endometrial malignancy.  CT chest without contrast showed a 10 mm right lower lobe subpleural nodule.   Patient had endometrial biopsy which was consistent FIGO grade 2 endometrioid endometrial carcinoma.  Patient was seen by GYN oncology and hopes to take her for surgery if she has good response to neoadjuvant chemotherapy.MSI unstable.  BRAF negative.  Loss of major and minor MMR proteins MLH1 and PMS2 less than 5%  of tumor expression.  MLH1 hyper methylation present.  This is most likely due to somatic epigenetic modification which can be seen in about 77% of sporadic endometrial cancers.   Patient completed 6 cycles of CarboTaxol Keytruda chemotherapy and is currently on maintenance Keytruda.  PET CT scan on 05/19/2022 showed enlarged uterus with hypermetabolic endometrial thickening.  Improving left periaortic nodal metastases.  No definitive bone metastases was noted.  Plan is to proceed with cytoreductive surgery at [facility].  Patient underwent hysterectomy and bilateral salpingo-oophorectomy on 06/10/2022.  Final pathology showed residual endometrioid adenocarcinoma of the endometrium FIGO grade 2 with marked treatment effect invading 18 mm and a 34 mm thick myometrium.  Fallopian tubes and ovaries were negative for malignancy.  Pelvic washings were negative for malignancy.   Patient is currently on maintenance Keytruda and plan is to do 14 maintenance cycles      Interval history-tolerating Keytruda well and denies any nausea vomiting diarrhea or skin rash.  She has been having on and off back pain especially when she sits after a long time.  Abdominal pain has also been intermittent but usually self-limited.  ECOG PS- 1 Pain scale- 4    Review of Systems  Constitutional:  Negative for chills, fever, malaise/fatigue and weight loss.  HENT:  Negative for congestion, ear discharge and nosebleeds.   Eyes:  Negative for blurred vision.  Respiratory:  Negative for cough, hemoptysis, sputum production, shortness of breath and wheezing.   Cardiovascular:  Negative for chest pain, palpitations, orthopnea and claudication.  Gastrointestinal:  Negative for abdominal pain, blood in stool, constipation, diarrhea, heartburn, melena, nausea and vomiting.  Genitourinary:  Negative for dysuria, flank  pain, frequency, hematuria and urgency.  Musculoskeletal:  Positive for back pain. Negative for joint pain and myalgias.   Skin:  Negative for rash.  Neurological:  Negative for dizziness, tingling, focal weakness, seizures, weakness and headaches.  Endo/Heme/Allergies:  Does not bruise/bleed easily.  Psychiatric/Behavioral:  Negative for depression and suicidal ideas. The patient does not have insomnia.     Allergies  Allergen Reactions   Bee Pollen Nausea And Vomiting    Itchy, swelling, watery eyes, runny nose.   Pollen Extract Nausea And Vomiting    Itchy, swelling, watery eyes, runny nose.    Past Medical History:  Diagnosis Date   Anemia    Endometrial cancer (HCC)    Hypertension    Menorrhagia    Pulmonary embolism [facility])     Past Surgical History:  Procedure Laterality Date   ABDOMINAL HYSTERECTOMY Bilateral 06/10/2022   at [facility]   CESAREAN SECTION  02/25/1992   COLONOSCOPY WITH PROPOFOL N/A 08/06/2021   Procedure: COLONOSCOPY WITH PROPOFOL;  Surgeon: Wyline Mood, MD;  Location: [facility] ENDOSCOPY;  Service: Gastroenterology;  Laterality: N/A;   ESOPHAGOGASTRODUODENOSCOPY (EGD) WITH PROPOFOL N/A 02/09/2020   Procedure: ESOPHAGOGASTRODUODENOSCOPY (EGD) WITH PROPOFOL;  Surgeon: Wyline Mood, MD;  Location: [facility] ENDOSCOPY;  Service: Gastroenterology;  Laterality: N/A;   IR IMAGING GUIDED PORT INSERTION  01/17/2022    Social History   Socioeconomic History   Marital status: Media planner    Spouse name: Not on file   Number of children: Not on file   Years of education: Not on file   Highest education level: Not on file  Occupational History   Not on file  Tobacco Use   Smoking status: Never   Smokeless tobacco: Never  Vaping Use   Vaping status: Never Used  Substance and Sexual Activity   Alcohol use: No   Drug use: No   Sexual activity: Not Currently    Birth control/protection: I.U.D.    Comment: patient states MD said did not see in CT today 01/17/2022  Other Topics Concern   Not on file  Social History Narrative   Lives with [facility], partner, and no pets.   Social  Drivers of Corporate investment banker Strain: Not on file  Food Insecurity: Not on file  Transportation Needs: Not on file  Physical Activity: Not on file  Stress: Not on file  Social Connections: Not on file  Intimate Partner Violence: Not on file    Family History  Problem Relation Age of Onset   Hypertension Mother    Stroke Mother    Heart attack Mother    Cancer Father    Hypertension Father    Cancer Sister    Cancer Sister    Cancer Brother    Hypertension Other      Current Outpatient Medications:    diclofenac Sodium (VOLTAREN) 1 % GEL, Apply 4 g topically 4 (four) times daily., Disp: 50 g, Rfl: 0   ELIQUIS 2.5 MG TABS tablet, TAKE 1 TABLET BY MOUTH TWICE A DAY, Disp: 60 tablet, Rfl: 3   fluconazole (DIFLUCAN) 150 MG tablet, Take 1 tablet (150 mg total) by mouth daily. Take 1 tablet (150 mg) by mouth. Three days later, take second tablet., Disp: 2 tablet, Rfl: 0   gabapentin (NEURONTIN) 300 MG capsule, Take 1 capsule (300 mg total) by mouth 3 (three) times daily. Start with once a day and gradually increase to TID over 1 week, Disp: 90 capsule, Rfl: 0   KLOR-CON M20 20  MEQ tablet, TAKE 1 TABLET (20 MEQ TOTAL) BY MOUTH 2 (TWO) TIMES DAILY. DISSOLVE TABLETS, Disp: 60 tablet, Rfl: 2   levonorgestrel (MIRENA) 20 MCG/24HR IUD, 1 Intra Uterine Device (1 each total) by Intrauterine route once., Disp: 1 each, Rfl: 0   lidocaine-prilocaine (EMLA) cream, Apply to port site as directed, Disp: 30 g, Rfl: 3   LORazepam (ATIVAN) 0.5 MG tablet, Take 1 tablet (0.5 mg total) by mouth every 8 (eight) hours., Disp: 30 tablet, Rfl: 0   magnesium hydroxide (MILK OF MAGNESIA) 400 MG/5ML suspension, Take 15 mLs by mouth daily as needed for moderate constipation. Take if no bowel movement for 2 days., Disp: , Rfl:    methocarbamol (ROBAXIN) 500 MG tablet, Take 1 tablet (500 mg total) by mouth 2 (two) times daily as needed for muscle spasms., Disp: 30 tablet, Rfl: 0   nystatin  (MYCOSTATIN/NYSTOP) powder, Apply 1 Application topically 3 (three) times daily., Disp: 30 g, Rfl: 3   oxyCODONE (OXY IR/ROXICODONE) 5 MG immediate release tablet, Take 1 tablet (5 mg total) by mouth every 4 (four) hours as needed for severe pain (pain score 7-10)., Disp: 60 tablet, Rfl: 0   pantoprazole (PROTONIX) 40 MG tablet, Take 1 tablet (40 mg total) by mouth daily., Disp: 30 tablet, Rfl: 2   polyethylene glycol (MIRALAX) 17 g packet, Take 17 g by mouth daily. To prevent constipation, Disp: 30 each, Rfl: 3   prochlorperazine (COMPAZINE) 10 MG tablet, Take 1 tablet (10 mg total) by mouth every 6 (six) hours as needed for nausea or vomiting., Disp: 30 tablet, Rfl: 1   senna (SENOKOT) 8.6 MG TABS tablet, Take 1-2 tablets (8.6-17.2 mg total) by mouth daily. To prevent constipation, Disp: 120 tablet, Rfl: 0   benzonatate (TESSALON) 100 MG capsule, Take 1 capsule (100 mg total) by mouth 3 (three) times daily as needed for cough. (Patient not taking: Reported on 03/18/2023), Disp: 20 capsule, Rfl: 0  No results found.  No images are attached to the encounter.      Latest Ref Rng & Units 03/18/2023    8:34 AM  CMP  Glucose 70 - 99 mg/dL 93   BUN 6 - 20 mg/dL 17   Creatinine 7.10 - 1.00 mg/dL 6.26   Sodium 948 - 546 mmol/L 139   Potassium 3.5 - 5.1 mmol/L 4.0   Chloride 98 - 111 mmol/L 104   CO2 22 - 32 mmol/L 27   Calcium 8.9 - 10.3 mg/dL 9.3   Total Protein 6.5 - 8.1 g/dL 7.3   Total Bilirubin 0.0 - 1.2 mg/dL 0.6   Alkaline Phos 38 - 126 U/L 67   AST 15 - 41 U/L 16   ALT 0 - 44 U/L 13       Latest Ref Rng & Units 03/18/2023    8:34 AM  CBC  WBC 4.0 - 10.5 K/uL 4.7   Hemoglobin 12.0 - 15.0 g/dL 27.0   Hematocrit 35.0 - 46.0 % 34.8   Platelets 150 - 400 K/uL 228      Observation/objective: Appears in no acute distress over video visit today.  Breathing is nonlabored  Assessment and plan: Patient is a 48 year old female with stage IVb endometrioid endometrial cancer here for on  treatment assessment prior to cycle 9 of palliative Keytruda  Counts okay to proceed with cycle 9 of palliative Keytruda today and I will see her back in 6 weeks for cycle 10.  Plan is to do 14 maintenance cycles.  I have  reviewed CT chest abdomen and pelvis images independently and discussed findings with the patient which overall shows no evidence of Progressive disease.  A small 1.4 cm right inguinal lymph node was minimally hypermetabolic on prior PET and has remained unchanged.  Left periaortic soft tissue density unchanged.  She does have a chronic compression deformity of the L2 vertebral body and it is unclear if this represents a pathologic fracture.  She is currently complaining of on and back pain and I would like her to be seen by interventional radiology to see if kyphoplasty is an option for her.  Follow-up instructions: I will see her back in 6 weeks for cycle 10 of palliative Keytruda  I discussed the assessment and treatment plan with the patient. The patient was provided an opportunity to ask questions and all were answered. The patient agreed with the plan and demonstrated an understanding of the instructions.   The patient was advised to call back or seek an in-person evaluation if the symptoms worsen or if the condition fails to improve as anticipated.  I provided 16 minutes of face-to-face video visit time during this encounter, and > 50% was spent counseling as documented under my assessment & plan.  Visit Diagnosis: 1. Endometrial cancer (HCC)   2. Encounter for antineoplastic immunotherapy   3. Closed compression fracture of L2 lumbar vertebra with routine healing, subsequent encounter     Dr. Owens Shark, MD, MPH [facility] at [facility] Tel- [phone] 03/18/2023 9:30 AM

## 2023-03-18 NOTE — Progress Notes (Signed)
Patient here for oncology follow-up appointment, concerns of constant abdominal pain

## 2023-03-25 ENCOUNTER — Ambulatory Visit: Payer: BC Managed Care – PPO | Admitting: Physical Therapy

## 2023-04-01 ENCOUNTER — Ambulatory Visit: Payer: BC Managed Care – PPO | Admitting: Physical Therapy

## 2023-04-20 ENCOUNTER — Telehealth: Payer: Self-pay | Admitting: *Deleted

## 2023-04-20 NOTE — Telephone Encounter (Signed)
 She would like the rao team to call her and see what they can do  for the issues. Please call or or my chart response

## 2023-04-20 NOTE — Telephone Encounter (Signed)
 I dont see what her issues are presently. What does she need help with?

## 2023-04-21 ENCOUNTER — Telehealth: Payer: Self-pay

## 2023-04-21 NOTE — Telephone Encounter (Signed)
 Can you get her seen by IR for kyphoplasty L2?you had spoken to them and sent a referral but looks like there was no movement?

## 2023-04-21 NOTE — Telephone Encounter (Signed)
 Called patient to get a better understanding of her concerns. Patient stated that she is having severe left lower back pain and can't stand for more than 4hrs. She also stated it is hard for her to bend/lean forward. She stated she has been using ice packs & prescribed pain medication, but does not want to abuse pain medication as well. Also, this pain started Saturday morning per the patient. She also stated that Dr. Smith Robert spoke to her regarding a procedure associated with this pain and is interested to revisit that option/conversation with Dr. Smith Robert.  I advised patient that I would relay the message to Dr. Smith Robert and give her a call back.

## 2023-04-22 ENCOUNTER — Other Ambulatory Visit: Payer: Self-pay

## 2023-04-22 DIAGNOSIS — C541 Malignant neoplasm of endometrium: Secondary | ICD-10-CM

## 2023-04-22 DIAGNOSIS — S32020D Wedge compression fracture of second lumbar vertebra, subsequent encounter for fracture with routine healing: Secondary | ICD-10-CM

## 2023-04-23 ENCOUNTER — Encounter: Payer: Self-pay | Admitting: Oncology

## 2023-04-24 ENCOUNTER — Encounter: Payer: Self-pay | Admitting: *Deleted

## 2023-04-28 ENCOUNTER — Encounter: Payer: Self-pay | Admitting: Oncology

## 2023-04-28 ENCOUNTER — Ambulatory Visit
Admission: RE | Admit: 2023-04-28 | Discharge: 2023-04-28 | Disposition: A | Discharge status: Home / Self Care | Payer: BC Managed Care – PPO | Source: Ambulatory Visit | Attending: Hospice and Palliative Medicine

## 2023-04-28 DIAGNOSIS — C541 Malignant neoplasm of endometrium: Secondary | ICD-10-CM

## 2023-04-28 DIAGNOSIS — M545 Low back pain, unspecified: Secondary | ICD-10-CM | POA: Diagnosis not present

## 2023-04-28 HISTORY — PX: IR RADIOLOGIST EVAL & MGMT: IMG5224
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
## 2023-04-29 ENCOUNTER — Encounter: Payer: Self-pay | Admitting: Oncology

## 2023-04-29 ENCOUNTER — Inpatient Hospital Stay: Payer: BC Managed Care – PPO | Attending: Obstetrics and Gynecology

## 2023-04-29 ENCOUNTER — Other Ambulatory Visit: Payer: Self-pay | Admitting: Interventional Radiology

## 2023-04-29 ENCOUNTER — Other Ambulatory Visit: Payer: Self-pay

## 2023-04-29 ENCOUNTER — Inpatient Hospital Stay: Payer: BC Managed Care – PPO

## 2023-04-29 ENCOUNTER — Inpatient Hospital Stay (HOSPITAL_BASED_OUTPATIENT_CLINIC_OR_DEPARTMENT_OTHER): Payer: BC Managed Care – PPO | Admitting: Oncology

## 2023-04-29 ENCOUNTER — Encounter: Payer: Self-pay | Admitting: *Deleted

## 2023-04-29 VITALS — BP 126/80 | HR 73 | Resp 16

## 2023-04-29 VITALS — BP 114/91 | HR 81 | Temp 98.1°F | Resp 18 | Wt 318.0 lb

## 2023-04-29 DIAGNOSIS — Z79899 Other long term (current) drug therapy: Secondary | ICD-10-CM | POA: Insufficient documentation

## 2023-04-29 DIAGNOSIS — R5383 Other fatigue: Secondary | ICD-10-CM

## 2023-04-29 DIAGNOSIS — I10 Essential (primary) hypertension: Secondary | ICD-10-CM | POA: Diagnosis not present

## 2023-04-29 DIAGNOSIS — Z90722 Acquired absence of ovaries, bilateral: Secondary | ICD-10-CM | POA: Diagnosis not present

## 2023-04-29 DIAGNOSIS — M549 Dorsalgia, unspecified: Secondary | ICD-10-CM | POA: Diagnosis not present

## 2023-04-29 DIAGNOSIS — C772 Secondary and unspecified malignant neoplasm of intra-abdominal lymph nodes: Secondary | ICD-10-CM | POA: Diagnosis not present

## 2023-04-29 DIAGNOSIS — N920 Excessive and frequent menstruation with regular cycle: Secondary | ICD-10-CM | POA: Insufficient documentation

## 2023-04-29 DIAGNOSIS — C541 Malignant neoplasm of endometrium: Secondary | ICD-10-CM

## 2023-04-29 DIAGNOSIS — S32020A Wedge compression fracture of second lumbar vertebra, initial encounter for closed fracture: Secondary | ICD-10-CM

## 2023-04-29 DIAGNOSIS — Z9079 Acquired absence of other genital organ(s): Secondary | ICD-10-CM | POA: Insufficient documentation

## 2023-04-29 DIAGNOSIS — Z90710 Acquired absence of both cervix and uterus: Secondary | ICD-10-CM | POA: Diagnosis not present

## 2023-04-29 DIAGNOSIS — Z86711 Personal history of pulmonary embolism: Secondary | ICD-10-CM | POA: Insufficient documentation

## 2023-04-29 DIAGNOSIS — Z7901 Long term (current) use of anticoagulants: Secondary | ICD-10-CM | POA: Diagnosis not present

## 2023-04-29 DIAGNOSIS — R918 Other nonspecific abnormal finding of lung field: Secondary | ICD-10-CM | POA: Insufficient documentation

## 2023-04-29 DIAGNOSIS — Z5112 Encounter for antineoplastic immunotherapy: Secondary | ICD-10-CM | POA: Insufficient documentation

## 2023-04-29 DIAGNOSIS — Z791 Long term (current) use of non-steroidal anti-inflammatories (NSAID): Secondary | ICD-10-CM | POA: Insufficient documentation

## 2023-04-29 LAB — CBC WITH DIFFERENTIAL/PLATELET
Abs Immature Granulocytes: 0.05 10*3/uL (ref 0.00–0.07)
Basophils Absolute: 0 10*3/uL (ref 0.0–0.1)
Basophils Relative: 1 %
Eosinophils Absolute: 0.2 10*3/uL (ref 0.0–0.5)
Eosinophils Relative: 3 %
HCT: 34.3 % — ABNORMAL LOW (ref 36.0–46.0)
Hemoglobin: 12.4 g/dL (ref 12.0–15.0)
Immature Granulocytes: 1 %
Lymphocytes Relative: 33 %
Lymphs Abs: 1.8 10*3/uL (ref 0.7–4.0)
MCH: 27.7 pg (ref 26.0–34.0)
MCHC: 36.2 g/dL — ABNORMAL HIGH (ref 30.0–36.0)
MCV: 76.7 fL — ABNORMAL LOW (ref 80.0–100.0)
Monocytes Absolute: 0.4 10*3/uL (ref 0.1–1.0)
Monocytes Relative: 7 %
Neutro Abs: 3.1 10*3/uL (ref 1.7–7.7)
Neutrophils Relative %: 55 %
Platelets: 282 10*3/uL (ref 150–400)
RBC: 4.47 MIL/uL (ref 3.87–5.11)
RDW: 14.1 % (ref 11.5–15.5)
WBC: 5.5 10*3/uL (ref 4.0–10.5)
nRBC: 0 % (ref 0.0–0.2)

## 2023-04-29 LAB — COMPREHENSIVE METABOLIC PANEL
ALT: 12 U/L (ref 0–44)
AST: 17 U/L (ref 15–41)
Albumin: 3.4 g/dL — ABNORMAL LOW (ref 3.5–5.0)
Alkaline Phosphatase: 73 U/L (ref 38–126)
Anion gap: 7 (ref 5–15)
BUN: 14 mg/dL (ref 6–20)
CO2: 26 mmol/L (ref 22–32)
Calcium: 8.8 mg/dL — ABNORMAL LOW (ref 8.9–10.3)
Chloride: 101 mmol/L (ref 98–111)
Creatinine, Ser: 0.73 mg/dL (ref 0.44–1.00)
GFR, Estimated: >60 mL/min (ref >60)
Glucose, Bld: 90 mg/dL (ref 70–99)
Potassium: 3.5 mmol/L (ref 3.5–5.1)
Sodium: 134 mmol/L — ABNORMAL LOW (ref 135–145)
Total Bilirubin: 0.7 mg/dL (ref 0.0–1.2)
Total Protein: 7.6 g/dL (ref 6.5–8.1)

## 2023-04-29 LAB — TSH: TSH: 2.49 u[IU]/mL (ref 0.350–4.500)

## 2023-04-29 MED ORDER — LIDOCAINE-PRILOCAINE 2.5-2.5 % EX CREA: TOPICAL_CREAM | CUTANEOUS | 3 refills | Status: AC

## 2023-04-29 MED ORDER — GABAPENTIN 300 MG PO CAPS: 300.0000 mg | ORAL_CAPSULE | Freq: Three times a day (TID) | ORAL | 0 refills | Status: DC

## 2023-04-29 MED ORDER — SODIUM CHLORIDE 0.9 % IV SOLN
400.0000 mg | Freq: Once | INTRAVENOUS | Status: AC
  Administered 2023-04-29: 400 mg via INTRAVENOUS
  Filled 2023-04-29: qty 16

## 2023-04-29 MED ORDER — DEXAMETHASONE SODIUM PHOSPHATE 10 MG/ML IJ SOLN
10.0000 mg | Freq: Once | INTRAMUSCULAR | Status: AC
  Administered 2023-04-29: 10 mg via INTRAVENOUS
  Filled 2023-04-29: qty 1

## 2023-04-29 MED ORDER — DICLOFENAC SODIUM 1 % EX GEL: 4.0000 g | Freq: Four times a day (QID) | CUTANEOUS | 0 refills | Status: DC

## 2023-04-29 MED ORDER — NYSTATIN 100000 UNIT/GM EX POWD: 1.0000 | Freq: Three times a day (TID) | CUTANEOUS | 3 refills | Status: DC

## 2023-04-29 MED ORDER — HEPARIN SOD (PORK) LOCK FLUSH 100 UNIT/ML IV SOLN
500.0000 [IU] | Freq: Once | INTRAVENOUS | Status: AC | PRN
  Administered 2023-04-29: 500 [IU]
  Filled 2023-04-29: qty 5

## 2023-04-29 MED ORDER — SODIUM CHLORIDE 0.9 % IV SOLN
Freq: Once | INTRAVENOUS | Status: AC
  Filled 2023-04-29: qty 250

## 2023-04-29 NOTE — Progress Notes (Signed)
 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 Logan Bores for irregular menstrual bleeding.  He has had an IUD in place and initially it was attribute it to use of IUD and was tried to be controlled with progesterone agents.  However due to worsening abdominal pain patient underwent a CT abdomen and pelvis with contrast on 12/25/2021 which showed 8 mm   Posterior lung base mass.  Retroperitoneal adenopathy measuring 2.6 cm additional retrocrural adenopathy.  Faint lucent lesion involving L2 vertebral body.  The uterus is anteverted and enlarged.  Endometrium is enlarged heterogeneous and irregular extending to the fundal myometrium.  Findings consistent with endometrial malignancy.  CT chest without contrast showed a 10 mm right lower lobe subpleural nodule.   Patient had endometrial biopsy which was consistent FIGO grade 2 endometrioid endometrial carcinoma.  Patient was seen by GYN oncology and hopes to take her for surgery if she has good response to neoadjuvant chemotherapy.MSI unstable.  BRAF negative.  Loss of major and minor MMR proteins MLH1 and PMS2 less than 5% of tumor expression.  MLH1 hyper methylation present.  This is most likely due to somatic epigenetic modification which can be seen in about 77% of sporadic endometrial cancers.   Patient completed 6 cycles of CarboTaxol Keytruda chemotherapy and is  currently on maintenance Keytruda.  PET CT scan on 05/19/2022 showed enlarged uterus with hypermetabolic endometrial thickening.  Improving left periaortic nodal metastases.  No definitive bone metastases was noted.  Plan is to proceed with cytoreductive surgery at [facility].  Patient underwent hysterectomy and bilateral salpingo-oophorectomy on 06/10/2022.  Final pathology showed residual endometrioid adenocarcinoma of the endometrium FIGO grade 2 with marked treatment effect invading 18 mm and a 34 mm thick myometrium.  Fallopian tubes and ovaries were negative for malignancy.  Pelvic washings were negative for malignancy.   Patient is currently on maintenance Keytruda and plan is to do 14 maintenance cycles    Interval history-patient reports occasional left lower quadrant abdominal pain which comes and goes.  With regards to her back pain it is mostly manageable but pain is most noticeable when she sits for a few hours at a stretch.  And mainly in her lower back.  ECOG PS- 1 Pain scale- 4 Opioid associated constipation- no  Review of systems- Review of Systems  Constitutional:  Negative for chills, fever, malaise/fatigue and weight loss.  HENT:  Negative for congestion, ear discharge and nosebleeds.   Eyes:  Negative for blurred vision.  Respiratory:  Negative for cough, hemoptysis, sputum production, shortness of breath and wheezing.   Cardiovascular:  Negative for chest pain, palpitations, orthopnea and claudication.  Gastrointestinal:  Negative for abdominal pain, blood in stool, constipation, diarrhea, heartburn, melena, nausea and vomiting.  Genitourinary:  Negative for dysuria, flank pain, frequency, hematuria and urgency.  Musculoskeletal:  Positive for back pain. Negative for joint pain and myalgias.  Skin:  Negative for rash.  Neurological:  Negative for dizziness, tingling, focal weakness, seizures,  weakness and headaches.  Endo/Heme/Allergies:  Does not bruise/bleed easily.   Psychiatric/Behavioral:  Negative for depression and suicidal ideas. The patient does not have insomnia.       Allergies  Allergen Reactions   Bee Pollen Nausea And Vomiting    Itchy, swelling, watery eyes, runny nose.   Pollen Extract Nausea And Vomiting    Itchy, swelling, watery eyes, runny nose.     Past Medical History:  Diagnosis Date   Anemia    Endometrial cancer (HCC)    Hypertension    Menorrhagia    Pulmonary embolism [facility])      Past Surgical History:  Procedure Laterality Date   ABDOMINAL HYSTERECTOMY Bilateral 06/10/2022   at [facility]   CESAREAN SECTION  02/25/1992   COLONOSCOPY WITH PROPOFOL N/A 08/06/2021   Procedure: COLONOSCOPY WITH PROPOFOL;  Surgeon: Wyline Mood, MD;  Location: [facility] ENDOSCOPY;  Service: Gastroenterology;  Laterality: N/A;   ESOPHAGOGASTRODUODENOSCOPY (EGD) WITH PROPOFOL N/A 02/09/2020   Procedure: ESOPHAGOGASTRODUODENOSCOPY (EGD) WITH PROPOFOL;  Surgeon: Wyline Mood, MD;  Location: [facility] ENDOSCOPY;  Service: Gastroenterology;  Laterality: N/A;   IR IMAGING GUIDED PORT INSERTION  01/17/2022   IR RADIOLOGIST EVAL & MGMT  04/28/2023    Social History   Socioeconomic History   Marital status: Media planner    Spouse name: Not on file   Number of children: Not on file   Years of education: Not on file   Highest education level: Not on file  Occupational History   Not on file  Tobacco Use   Smoking status: Never   Smokeless tobacco: Never  Vaping Use   Vaping status: Never Used  Substance and Sexual Activity   Alcohol use: No   Drug use: No   Sexual activity: Not Currently    Birth control/protection: I.U.D.    Comment: patient states MD said did not see in CT today 01/17/2022  Other Topics Concern   Not on file  Social History Narrative   Lives with [facility], partner, and no pets.   Social Drivers of Corporate investment banker Strain: Not on file  Food Insecurity: Not on file  Transportation Needs: Not on file  Physical  Activity: Not on file  Stress: Not on file  Social Connections: Not on file  Intimate Partner Violence: Not on file    Family History  Problem Relation Age of Onset   Hypertension Mother    Stroke Mother    Heart attack Mother    Cancer Father    Hypertension Father    Cancer Sister    Cancer Sister    Cancer Brother    Hypertension Other      Current Outpatient Medications:    diclofenac Sodium (VOLTAREN) 1 % GEL, Apply 4 g topically 4 (four) times daily., Disp: 50 g, Rfl: 0   ELIQUIS 2.5 MG TABS tablet, TAKE 1 TABLET BY MOUTH TWICE A DAY, Disp: 60 tablet, Rfl: 3   gabapentin (NEURONTIN) 300 MG capsule, Take 1 capsule (300 mg total) by mouth 3 (three) times daily. Start with once a day and gradually increase to TID over 1 week, Disp: 90 capsule, Rfl: 0   KLOR-CON M20 20 MEQ tablet, TAKE 1 TABLET (20 MEQ TOTAL) BY MOUTH 2 (TWO) TIMES DAILY. DISSOLVE TABLETS, Disp: 60 tablet, Rfl: 2   levonorgestrel (MIRENA) 20 MCG/24HR IUD, 1 Intra Uterine Device (1 each total) by Intrauterine route once., Disp: 1 each, Rfl: 0   lidocaine-prilocaine (EMLA) cream, Apply to port site as directed,  Disp: 30 g, Rfl: 3   LORazepam (ATIVAN) 0.5 MG tablet, Take 1 tablet (0.5 mg total) by mouth every 8 (eight) hours., Disp: 30 tablet, Rfl: 0   magnesium hydroxide (MILK OF MAGNESIA) 400 MG/5ML suspension, Take 15 mLs by mouth daily as needed for moderate constipation. Take if no bowel movement for 2 days., Disp: , Rfl:    nystatin (MYCOSTATIN/NYSTOP) powder, Apply 1 Application topically 3 (three) times daily., Disp: 30 g, Rfl: 3   oxyCODONE (OXY IR/ROXICODONE) 5 MG immediate release tablet, Take 1 tablet (5 mg total) by mouth every 4 (four) hours as needed for severe pain (pain score 7-10)., Disp: 60 tablet, Rfl: 0   polyethylene glycol (MIRALAX) 17 g packet, Take 17 g by mouth daily. To prevent constipation, Disp: 30 each, Rfl: 3   benzonatate (TESSALON) 100 MG capsule, Take 1 capsule (100 mg total) by mouth  3 (three) times daily as needed for cough. (Patient not taking: Reported on 04/29/2023), Disp: 20 capsule, Rfl: 0   fluconazole (DIFLUCAN) 150 MG tablet, Take 1 tablet (150 mg total) by mouth daily. Take 1 tablet (150 mg) by mouth. Three days later, take second tablet. (Patient not taking: Reported on 04/29/2023), Disp: 2 tablet, Rfl: 0   methocarbamol (ROBAXIN) 500 MG tablet, Take 1 tablet (500 mg total) by mouth 2 (two) times daily as needed for muscle spasms. (Patient not taking: Reported on 04/29/2023), Disp: 30 tablet, Rfl: 0   pantoprazole (PROTONIX) 40 MG tablet, Take 1 tablet (40 mg total) by mouth daily. (Patient not taking: Reported on 04/29/2023), Disp: 30 tablet, Rfl: 2   prochlorperazine (COMPAZINE) 10 MG tablet, Take 1 tablet (10 mg total) by mouth every 6 (six) hours as needed for nausea or vomiting. (Patient not taking: Reported on 04/28/2023), Disp: 30 tablet, Rfl: 1   senna (SENOKOT) 8.6 MG TABS tablet, Take 1-2 tablets (8.6-17.2 mg total) by mouth daily. To prevent constipation (Patient not taking: Reported on 04/29/2023), Disp: 120 tablet, Rfl: 0  Physical exam:  Vitals:   04/29/23 0854  BP: (!) 114/91  Pulse: 81  Resp: 18  Temp: 98.1 F (36.7 C)  TempSrc: Tympanic  SpO2: 99%  Weight: (!) 318 lb (144.2 kg)   Physical Exam Cardiovascular:     Rate and Rhythm: Normal rate and regular rhythm.     Heart sounds: Normal heart sounds.  Pulmonary:     Effort: Pulmonary effort is normal.     Breath sounds: Normal breath sounds.  Skin:    General: Skin is warm and dry.  Neurological:     Mental Status: She is alert and oriented to person, place, and time.         Latest Ref Rng & Units 03/18/2023    8:34 AM  CMP  Glucose 70 - 99 mg/dL 93   BUN 6 - 20 mg/dL 17   Creatinine 1.30 - 1.00 mg/dL 8.65   Sodium 784 - 696 mmol/L 139   Potassium 3.5 - 5.1 mmol/L 4.0   Chloride 98 - 111 mmol/L 104   CO2 22 - 32 mmol/L 27   Calcium 8.9 - 10.3 mg/dL 9.3   Total Protein 6.5 - 8.1 g/dL 7.3    Total Bilirubin 0.0 - 1.2 mg/dL 0.6   Alkaline Phos 38 - 126 U/L 67   AST 15 - 41 U/L 16   ALT 0 - 44 U/L 13       Latest Ref Rng & Units 03/18/2023    8:34 AM  CBC  WBC 4.0 - 10.5 K/uL 4.7   Hemoglobin 12.0 - 15.0 g/dL 78.4   Hematocrit 69.6 - 46.0 % 34.8   Platelets 150 - 400 K/uL 228     No images are attached to the encounter.  IR Radiologist Eval & Mgmt Result Date: 04/28/2023 EXAM: NEW PATIENT OFFICE VISIT CHIEF COMPLAINT: SEE NOTE IN EPIC HISTORY OF PRESENT ILLNESS: SEE NOTE IN EPIC REVIEW OF SYSTEMS: SEE NOTE IN EPIC PHYSICAL EXAMINATION: SEE NOTE IN EPIC ASSESSMENT AND PLAN: SEE NOTE IN EPIC Electronically Signed   By: Malachy Moan M.D.   On: 04/28/2023 16:00     Assessment and plan- Patient is a 47 y.o. female with stage IVb endometrioid endometrial cancer.  She is here for on treatment assessment prior to cycle 10 of palliative [name]  Okay to proceed with cycle 10 of palliative Keytruda today.  I will see her back in 6 weeks for cycle 11.  Plan is to do 2-14 cycles followed by continued observation off treatment. TSH from today is pending.  Patient has had an excellent response to treatment so far and her CT scans in December 2024 showed no evidence of progressive disease.  There was no active disease noted at that time.  She has an L2 vertebral body fracture and although it has been thought to be pathologic-this has not been biopsy-proven.  There is evidence of sclerosis and healing.  Given her persistent pain she was referred to interventional radiology for consideration for kyphoplasty to this area.  Dr. Archer Asa evaluated the patient and felt that an MRI may be more helpful to see if L2 lesion is truly the cause of her pain or if she has any evidence of spondylosis in her lumbar spine that could explain her back pain.  This is currently pending.  He will be following up with her after the MRI as well.  I have not started her on any bisphosphonates for this L2  fracture but this is something I can consider down the line especially if this is seem to be the source of her pain.   Visit Diagnosis 1. Endometrial cancer (HCC)   2. Encounter for antineoplastic immunotherapy      Dr. Owens Shark, MD, MPH [facility] at [facility] [phone] 04/29/2023 8:54 AM

## 2023-04-29 NOTE — Patient Instructions (Signed)
 [facility] - A DEPT OF [facility]  Discharge Instructions: Thank you for choosing [facility] to provide your oncology and hematology care.  If you have a lab appointment with the Cancer Center, please go directly to the Cancer Center and check in at the registration area.  Wear comfortable clothing and clothing appropriate for easy access to any Portacath or PICC line.   We strive to give you quality time with your provider. You may need to reschedule your appointment if you arrive late (15 or more minutes).  Arriving late affects you and other patients whose appointments are after yours.  Also, if you miss three or more appointments without notifying the office, you may be dismissed from the clinic at the provider's discretion.      For prescription refill requests, have your pharmacy contact our office and allow 72 hours for refills to be completed.    Today you received the following chemotherapy and/or immunotherapy agents Keytruda      To help prevent nausea and vomiting after your treatment, we encourage you to take your nausea medication as directed.  BELOW ARE SYMPTOMS THAT SHOULD BE REPORTED IMMEDIATELY: *FEVER GREATER THAN 100.4 F (38 C) OR HIGHER *CHILLS OR SWEATING *NAUSEA AND VOMITING THAT IS NOT CONTROLLED WITH YOUR NAUSEA MEDICATION *UNUSUAL SHORTNESS OF BREATH *UNUSUAL BRUISING OR BLEEDING *URINARY PROBLEMS (pain or burning when urinating, or frequent urination) *BOWEL PROBLEMS (unusual diarrhea, constipation, pain near the anus) TENDERNESS IN MOUTH AND THROAT WITH OR WITHOUT PRESENCE OF ULCERS (sore throat, sores in mouth, or a toothache) UNUSUAL RASH, SWELLING OR PAIN  UNUSUAL VAGINAL DISCHARGE OR ITCHING   Items with * indicate a potential emergency and should be followed up as soon as possible or go to the Emergency Department if any problems should occur.  Please show the CHEMOTHERAPY ALERT CARD or IMMUNOTHERAPY  ALERT CARD at check-in to the Emergency Department and triage nurse.  Should you have questions after your visit or need to cancel or reschedule your appointment, please contact [facility] - A DEPT OF [facility]  [phone] and follow the prompts.  Office hours are 8:00 a.m. to 4:30 p.m. Monday - Friday. Please note that voicemails left after 4:00 p.m. may not be returned until the following business day.  We are closed weekends and major holidays. You have access to a nurse at all times for urgent questions. Please call the main number to the clinic [phone] and follow the prompts.  For any non-urgent questions, you may also contact your provider using MyChart. We now offer e-Visits for anyone 58 and older to request care online for non-urgent symptoms. For details visit [email or web address].   Also download the MyChart app! Go to the app store, search "MyChart", open the app, select [place], and log in with your MyChart username and password.

## 2023-04-30 ENCOUNTER — Ambulatory Visit
Admission: RE | Admit: 2023-04-30 | Discharge: 2023-04-30 | Disposition: A | Discharge status: Home / Self Care | Source: Ambulatory Visit | Attending: Interventional Radiology | Admitting: Interventional Radiology

## 2023-04-30 ENCOUNTER — Other Ambulatory Visit: Payer: Self-pay

## 2023-04-30 DIAGNOSIS — M5126 Other intervertebral disc displacement, lumbar region: Secondary | ICD-10-CM | POA: Diagnosis not present

## 2023-04-30 DIAGNOSIS — M47816 Spondylosis without myelopathy or radiculopathy, lumbar region: Secondary | ICD-10-CM | POA: Diagnosis not present

## 2023-04-30 DIAGNOSIS — C541 Malignant neoplasm of endometrium: Secondary | ICD-10-CM

## 2023-04-30 DIAGNOSIS — S32020A Wedge compression fracture of second lumbar vertebra, initial encounter for closed fracture: Secondary | ICD-10-CM

## 2023-04-30 DIAGNOSIS — M8448XD Pathological fracture, other site, subsequent encounter for fracture with routine healing: Secondary | ICD-10-CM | POA: Diagnosis not present

## 2023-04-30 MED ORDER — GADOPICLENOL 0.5 MMOL/ML IV SOLN
10.0000 mL | Freq: Once | INTRAVENOUS | Status: AC | PRN
  Administered 2023-04-30: 10 mL via INTRAVENOUS

## 2023-05-01 ENCOUNTER — Other Ambulatory Visit: Payer: Self-pay | Admitting: Interventional Radiology

## 2023-05-01 ENCOUNTER — Encounter: Payer: Self-pay | Admitting: *Deleted

## 2023-05-01 ENCOUNTER — Telehealth: Payer: Self-pay | Admitting: *Deleted

## 2023-05-01 DIAGNOSIS — S32020A Wedge compression fracture of second lumbar vertebra, initial encounter for closed fracture: Secondary | ICD-10-CM

## 2023-05-01 DIAGNOSIS — C541 Malignant neoplasm of endometrium: Secondary | ICD-10-CM

## 2023-05-01 NOTE — Telephone Encounter (Signed)
 Sent message by my chart that the FMLA for her was done ane it went throught transmission

## 2023-05-04 ENCOUNTER — Telehealth: Payer: Self-pay

## 2023-05-04 ENCOUNTER — Other Ambulatory Visit: Payer: Self-pay | Admitting: Oncology

## 2023-05-04 NOTE — Telephone Encounter (Signed)
 Faxed El[facility] clearance to [facility]

## 2023-05-05 ENCOUNTER — Other Ambulatory Visit: Payer: Self-pay

## 2023-05-05 ENCOUNTER — Ambulatory Visit
Admission: RE | Admit: 2023-05-05 | Discharge: 2023-05-05 | Disposition: A | Discharge status: Home / Self Care | Source: Ambulatory Visit | Attending: Interventional Radiology | Admitting: Interventional Radiology

## 2023-05-05 ENCOUNTER — Telehealth: Payer: Self-pay

## 2023-05-05 DIAGNOSIS — S32020A Wedge compression fracture of second lumbar vertebra, initial encounter for closed fracture: Secondary | ICD-10-CM

## 2023-05-05 DIAGNOSIS — M545 Low back pain, unspecified: Secondary | ICD-10-CM | POA: Diagnosis not present

## 2023-05-05 DIAGNOSIS — M47817 Spondylosis without myelopathy or radiculopathy, lumbosacral region: Secondary | ICD-10-CM | POA: Diagnosis not present

## 2023-05-05 DIAGNOSIS — C541 Malignant neoplasm of endometrium: Secondary | ICD-10-CM

## 2023-05-05 HISTORY — PX: IR RADIOLOGIST EVAL & MGMT: IMG5224
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
## 2023-05-05 NOTE — Telephone Encounter (Signed)
 Lauren, please review and advise.  Patient had virtual visit with IR Dr. Malachy Moan today as referred by Franklyn Lor, NP.  Dr. Archer Asa is recommending patient be scheduled for bilateral L5-S1 facet injections to be followed 2 weeks later by bilateral L4-L5 facet injections (note available in chart)  Chip Boer at [facility] is request Dr. Calani Gick Robert or Franklyn Lor, NP to enter orders.  I informed Chip Boer that both providers are out of the office for the week but will forward to another NP for review.

## 2023-05-07 ENCOUNTER — Other Ambulatory Visit: Payer: Self-pay | Admitting: Nurse Practitioner

## 2023-05-07 ENCOUNTER — Encounter: Payer: Self-pay | Admitting: Oncology

## 2023-05-07 DIAGNOSIS — M545 Low back pain, unspecified: Secondary | ICD-10-CM

## 2023-05-07 NOTE — Telephone Encounter (Signed)
 [facility] notified that order has been entered.

## 2023-05-08 NOTE — Telephone Encounter (Signed)
 Per [facility] patient has been scheduled Jean Morris prefers to call [facility] at a later date to schedule second inj.

## 2023-05-11 NOTE — Discharge Instructions (Signed)

## 2023-05-12 ENCOUNTER — Ambulatory Visit
Admission: RE | Admit: 2023-05-12 | Discharge: 2023-05-12 | Disposition: A | Discharge status: Home / Self Care | Source: Ambulatory Visit | Attending: Nurse Practitioner | Admitting: Nurse Practitioner

## 2023-05-12 ENCOUNTER — Other Ambulatory Visit: Payer: Self-pay | Admitting: Nurse Practitioner

## 2023-05-12 ENCOUNTER — Encounter: Payer: Self-pay | Admitting: Nurse Practitioner

## 2023-05-12 ENCOUNTER — Ambulatory Visit
Admission: RE | Admit: 2023-05-12 | Discharge: 2023-05-12 | Disposition: A | Discharge status: Home / Self Care | Source: Ambulatory Visit | Attending: Nurse Practitioner

## 2023-05-12 DIAGNOSIS — M47817 Spondylosis without myelopathy or radiculopathy, lumbosacral region: Secondary | ICD-10-CM | POA: Diagnosis not present

## 2023-05-12 DIAGNOSIS — M545 Low back pain, unspecified: Secondary | ICD-10-CM

## 2023-05-12 DIAGNOSIS — M47816 Spondylosis without myelopathy or radiculopathy, lumbar region: Secondary | ICD-10-CM | POA: Diagnosis not present

## 2023-05-12 MED ORDER — IOPAMIDOL (ISOVUE-M 200) INJECTION 41%
2.0000 mL | Freq: Once | INTRAMUSCULAR | Status: AC
  Administered 2023-05-12: 2 mL via INTRA_ARTICULAR

## 2023-05-12 MED ORDER — METHYLPREDNISOLONE ACETATE 40 MG/ML INJ SUSP (RADIOLOG
80.0000 mg | Freq: Once | INTRAMUSCULAR | Status: AC
  Administered 2023-05-12: 80 mg via INTRA_ARTICULAR

## 2023-05-12 MED ORDER — BUPIVACAINE HCL 0.5 % IJ SOLN
1.0000 mL | Freq: Once | INTRAMUSCULAR | Status: AC
  Administered 2023-05-12: 1 mL via INTRA_ARTICULAR

## 2023-05-12 NOTE — Discharge Instructions (Signed)

## 2023-05-13 ENCOUNTER — Inpatient Hospital Stay (HOSPITAL_BASED_OUTPATIENT_CLINIC_OR_DEPARTMENT_OTHER): Payer: BC Managed Care – PPO | Admitting: Obstetrics and Gynecology

## 2023-05-13 VITALS — BP 144/96 | HR 79 | Temp 98.6°F | Resp 17 | Wt 320.5 lb

## 2023-05-13 DIAGNOSIS — Z79899 Other long term (current) drug therapy: Secondary | ICD-10-CM | POA: Diagnosis not present

## 2023-05-13 DIAGNOSIS — M549 Dorsalgia, unspecified: Secondary | ICD-10-CM | POA: Diagnosis not present

## 2023-05-13 DIAGNOSIS — C541 Malignant neoplasm of endometrium: Secondary | ICD-10-CM | POA: Diagnosis not present

## 2023-05-13 DIAGNOSIS — C772 Secondary and unspecified malignant neoplasm of intra-abdominal lymph nodes: Secondary | ICD-10-CM | POA: Diagnosis not present

## 2023-05-13 DIAGNOSIS — Z90710 Acquired absence of both cervix and uterus: Secondary | ICD-10-CM | POA: Diagnosis not present

## 2023-05-13 DIAGNOSIS — R918 Other nonspecific abnormal finding of lung field: Secondary | ICD-10-CM | POA: Diagnosis not present

## 2023-05-13 DIAGNOSIS — Z7901 Long term (current) use of anticoagulants: Secondary | ICD-10-CM | POA: Diagnosis not present

## 2023-05-13 DIAGNOSIS — Z791 Long term (current) use of non-steroidal anti-inflammatories (NSAID): Secondary | ICD-10-CM | POA: Diagnosis not present

## 2023-05-13 DIAGNOSIS — I10 Essential (primary) hypertension: Secondary | ICD-10-CM | POA: Diagnosis not present

## 2023-05-13 DIAGNOSIS — Z5112 Encounter for antineoplastic immunotherapy: Secondary | ICD-10-CM | POA: Diagnosis not present

## 2023-05-13 DIAGNOSIS — Z90722 Acquired absence of ovaries, bilateral: Secondary | ICD-10-CM | POA: Diagnosis not present

## 2023-05-13 DIAGNOSIS — Z86711 Personal history of pulmonary embolism: Secondary | ICD-10-CM | POA: Diagnosis not present

## 2023-05-13 DIAGNOSIS — Z9079 Acquired absence of other genital organ(s): Secondary | ICD-10-CM | POA: Diagnosis not present

## 2023-05-13 NOTE — Progress Notes (Addendum)
 Gynecologic Oncology Interval Visit   Referring Provider: Dr. Logan Bores & Dr Cathie Hoops  Chief Complaint: Endometrial adenocarcinoma, stage IVB  Subjective:  Jean Morris is a 48 y.o. female who is seen in consultation from Dr. Logan Bores for irregular bleeding for many years, diagnosed with Stage IVb endometrioid endometrial cancer currently s/p 6 cycles of neoadjuvant carboplatin with paclitaxel and keytruda with single agent keytruda and TLH BSO at [facility] with Dr Johnnette Litter on 06/10/22, restaging PET June 2024 consistent with response, continues maintenance pembrolizumab, who returns to clinic for pelvic exam.   Patient has no complaints today.  No bleeding, discharge, GI or GU issues.   02/09/2023 CT C/A/P IMPRESSION: 1. Stable examination without evidence of new or progressive disease within the chest, abdomen, or pelvis. 2. Similar left periaortic amorphous soft tissue density. 3. Unchanged pathologic sclerotic compression deformity of the L2 vertebral body. 4. Unchanged size of the 1.4 cm right inguinal lymph node which was minimally metabolically active on prior examination, nonspecific possibly reactive. 5.  Aortic Atherosclerosis (ICD10-I70.0).   Gynecologic Oncology History:  Patient presented to her OB/GYN for irregular bleeding for several months.  She was thought to have an IUD in place and her bleeding was attempted to be controlled with progesterone agents.  Medical history significant for PE in [facility] in 02/2015  12/25/21- CT C/A/P-  for left lower quadrant abdominal pain showed enlarged uterus with thickened endometrium, heterogeneous and irregular extending to the fundal myometrium.  Retroperitoneal and retrocrural adenopathy.  Faint lucent lesion in left L2 vertebrae suspicious for metastasis.  Posterior right lower lobe subpleural nodule measuring 10 mm with surrounding groundglass opacity; suspicious for metastasis though was present on 08/2015 CT but measured 5 mm.   Pelvic  ultrasound- Uterus-19 x 13.3 x 15 cm = volume 1974 mls.  Distinct endometrium not visible.  Heterogeneous area which may be surrounded by myometrium may be as much is 7 cm in greatest thickness. Endometrium-thickness, perhaps cystic is 75 mm.  Markedly heterogeneous with signs of internal flow. Right ovary-not visualized Left ovary-5.8 x 3.1 x 4.2 cm.  Simple dominant follicle measuring up to 3.5 cm accounts for much of the left ovarian dimension.  Pulse Doppler evaluation shows low resistance arterial and venous flow within the left ovary Other-small free fluid in the pelvis.  Endometrial biopsy- -endometrioid adenocarcinoma, FIGO grade 2  She was started on norethindrone by Dr. Logan Bores.   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 She was taking oral contraceptives at that time with increased dosing d/t heavy menstrual periods. Additionally, hx of iron deficiency and has taken oral iron. Given extent of disease and VTE neoaduvant therapy was recommended.   Treatment Summary:  01/21/22- D1C1- carboplatin 02/12/22- D1C2 carbo-paclitaxel-pembrolizumab 03/05/22- D1C3 carbo-paclitaxel-pembrolizumab  03/17/22- CT Chest Abdomen Pelvis W Contrast-  IMPRESSION: 1. Persistent heterogeneous irregularity of the endometrium with uterine enlargement compatible with known endometrial malignancy.  2. New heterogeneity of the 2.8 cm left ovarian lesion, nonspecific but possibly reflecting metastatic disease. Consider further evaluation with pelvic ultrasound. 3. Overall decreased retrocrural and retroperitoneal adenopathy. However, there is interval increase in size and adjacent infiltrative/desmoplastic stranding adjacent to a centrally necrotic left periaortic lymph node/lymph node conglomerate. 4. Continued decrease in size of the posterior right lower lobe pulmonary nodule. 5. New pathologic compression deformity of the L2 vertebral body. 6. Diffuse hepatic steatosis. 7. Mild  diffuse bronchial wall thickening with mosaic attenuation of the lungs, suggestive of small airways disease.  01/21/22 D1C1 Carbo-paclitaxel-keytruda 02/12/22  D1C2 Carbo-paclitaxel-keytruda 03/05/22 D1C3 Carbo-paclitaxel-keytruda 03/26/22 D1C4 Carbo-paclitaxel-keytruda 04/16/22 D1C5 Carbo-paclitaxel-keytruda 05/07/2022-  D1C6 carbo-paclitaxel-pembrolizumab. 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 Zometa 4 mg added.  05/19/2022  PET - enlarged uterus is hypermetabolic endometrial thickening consistent with known endometrial cancer. Improving left periaortic nodal metastases. Addtiional small cervical, axillary, and inguinal nodes reflective of nodal metastases.   05/28/22-  D1C7 - pembrolizumab 06/10/22-  TLH BSO at [facility] with Dr. Johnnette Litter.  Pathology: A.  Uterus, cervix, bilateral fallopian tubes and ovaries, hysterectomy, bilateral salpingo-oophorectomy: Residual endometrioid adenocarcinoma of the endometrium (0.6 cm), FIGO grade 2 of 3, with marked treatment effect, invading 18 mm in a 34 mm thick myometrium. 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 Cervix: Negative for malignancy. Serosa: Negative malignancy. Right and left ovaries: Negative for malignancy. Right and left fallopian tubes: Negative malignancy. Microsatellite instability and mismatch repair protein immunohistochemistry: Not performed (obtained on prior specimen). P53 immunohistochemistry: Not performed A: Pelvic Washings: negative. No evidence of malignancy.   She continues on maintenance pembrolizumab   09/09/22- PET for restaging. The aortacaval node measures 8 mm and a S.U.V. max of 3.8 on 82/4 versus 7 mm and a S.U.V. max of 3.5 on the prior exam. While this node is very small the SUV is concerning.  Given the findings below it is reasonable to complete pembrolizumab maintenance and reimage. Overall response to therapy is reassuring. If still positive on subsequent PET consider radiation therapy and then  reassess.  01/14/2023 1. 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  BRAF V600E not present. The tumor cells are NEGATIVE for Her2 (1+). Estrogen Receptor:       POSITIVE, 90%, MODERATE STAINING INTENSITY Progesterone Receptor:   POSITIVE, 80%, STRONG STAINING INTENSITY   Problem List: Patient Active Problem List   Diagnosis Date Noted   Hypokalemia 04/01/2022   Endometrial cancer (HCC) 01/15/2022   Iron deficiency anemia 01/15/2022   Morbid obesity with BMI of 60.0-69.9, adult (HCC) 04/18/2015   Abnormal uterine bleeding 04/18/2015   Anemia 04/18/2015   Pulmonary embolism (HCC) 03/11/2015    Past Medical History: Past Medical History:  Diagnosis Date   Anemia    Endometrial cancer (HCC)    Hypertension    Menorrhagia    Pulmonary embolism (HCC)     Past Surgical History: Past Surgical History:  Procedure Laterality Date   ABDOMINAL HYSTERECTOMY Bilateral 06/10/2022   at [facility]   CESAREAN SECTION  02/25/1992   COLONOSCOPY WITH PROPOFOL N/A 08/06/2021   Procedure: COLONOSCOPY WITH PROPOFOL;  Surgeon: Wyline Mood, MD;  Location: [facility] ENDOSCOPY;  Service: Gastroenterology;  Laterality: N/A;   ESOPHAGOGASTRODUODENOSCOPY (EGD) WITH PROPOFOL N/A 02/09/2020   Procedure: ESOPHAGOGASTRODUODENOSCOPY (EGD) WITH PROPOFOL;  Surgeon: Wyline Mood, MD;  Location: [facility] ENDOSCOPY;  Service: Gastroenterology;  Laterality: N/A;   IR IMAGING GUIDED PORT INSERTION  01/17/2022   IR RADIOLOGIST EVAL & MGMT  04/28/2023   IR RADIOLOGIST EVAL & MGMT  05/05/2023    Past Gynecologic History:  Menarche: age 62, last 5 days History of OCP/HRT use: yes Last pap:  03/14/21- ASCUS, HPV negative History of STDs: The patient denies history of sexually transmitted disease. Sexually active: yes; not currently  OB History:  OB History  Gravida Para Term Preterm AB Living  3 3 3   3    SAB IAB Ectopic Multiple Live Births      3    # Outcome  Date GA Lbr Len/2nd Weight Sex Type Anes PTL Lv  3 Term     M CS-LTranv   LIV  2 Term     M Vag-Spont   LIV  1 Term     F Vag-Spont   LIV   Family History: Family History  Problem Relation Age of Onset   Hypertension Mother    Stroke Mother    Heart attack Mother    Cancer Father    Hypertension Father    Cancer Sister    Cancer Sister    Cancer Brother    Hypertension Other    Social History: Social History   Socioeconomic History   Marital status: Media planner    Spouse name: Not on file   Number of children: Not on file   Years of education: Not on file   Highest education level: Not on file  Occupational History   Not on file  Tobacco Use   Smoking status: Never   Smokeless tobacco: Never  Vaping Use   Vaping status: Never Used  Substance and Sexual Activity   Alcohol use: No   Drug use: No   Sexual activity: Not Currently    Birth control/protection: I.U.D.    Comment: patient states MD said did not see in CT today 01/17/2022  Other Topics Concern   Not on file  Social History Narrative   Lives with [facility], partner, and no pets.   Social Drivers of Corporate investment banker Strain: Not on file  Food Insecurity: Not on file  Transportation Needs: Not on file  Physical Activity: Not on file  Stress: Not on file  Social Connections: Not on file  Intimate Partner Violence: Not on file   Allergies: Allergies  Allergen Reactions   Bee Pollen Nausea And Vomiting    Itchy, swelling, watery eyes, runny nose.   Pollen Extract Nausea And Vomiting    Itchy, swelling, watery eyes, runny nose.   Current Medications: Current Outpatient Medications  Medication Sig Dispense Refill   benzonatate (TESSALON) 100 MG capsule Take 1 capsule (100 mg total) by mouth 3 (three) times daily as needed for cough. (Patient not taking: Reported on 08/20/2022) 20 capsule 0   diclofenac Sodium (VOLTAREN) 1 % GEL  Apply 4 g topically 4 (four) times daily. 50 g 0   ELIQUIS 2.5 MG TABS tablet TAKE 1 TABLET BY MOUTH TWICE A DAY 60 tablet 3   fluconazole (DIFLUCAN) 150 MG tablet Take 1 tablet (150 mg total) by mouth daily. Take 1 tablet (150 mg) by mouth. Three days later, take second tablet. (Patient not taking: Reported on 04/28/2023) 2 tablet 0   gabapentin (NEURONTIN) 300 MG capsule Take 1 capsule (300 mg total) by mouth 3 (three) times daily. Start with once a day and gradually increase to TID over 1 week 90 capsule 0   KLOR-CON M20 20 MEQ tablet TAKE 1 TABLET (20 MEQ TOTAL) BY MOUTH 2 (TWO) TIMES DAILY. DISSOLVE TABLETS 60 tablet 2   levonorgestrel (MIRENA) 20 MCG/24HR IUD 1 Intra Uterine Device (1 each total) by Intrauterine route once. 1 each 0   lidocaine-prilocaine (EMLA) cream Apply to port site as directed 30 g 3   LORazepam (ATIVAN) 0.5 MG tablet Take 1 tablet (0.5 mg total) by mouth every 8 (eight) hours. 30 tablet 0   magnesium hydroxide (MILK OF MAGNESIA) 400 MG/5ML suspension Take 15 mLs by mouth daily as needed for moderate constipation. Take if no bowel movement for 2 days.  methocarbamol (ROBAXIN) 500 MG tablet Take 1 tablet (500 mg total) by mouth 2 (two) times daily as needed for muscle spasms. (Patient not taking: Reported on 04/28/2023) 30 tablet 0   nystatin (MYCOSTATIN/NYSTOP) powder Apply 1 Application topically 3 (three) times daily. 30 g 3   oxyCODONE (OXY IR/ROXICODONE) 5 MG immediate release tablet Take 1 tablet (5 mg total) by mouth every 4 (four) hours as needed for severe pain (pain score 7-10). 60 tablet 0   pantoprazole (PROTONIX) 40 MG tablet Take 1 tablet (40 mg total) by mouth daily. (Patient not taking: Reported on 04/28/2023) 30 tablet 2   polyethylene glycol (MIRALAX) 17 g packet Take 17 g by mouth daily. To prevent constipation 30 each 3   prochlorperazine (COMPAZINE) 10 MG tablet Take 1 tablet (10 mg total) by mouth every 6 (six) hours as needed for nausea or vomiting.  (Patient not taking: Reported on 04/28/2023) 30 tablet 1   senna (SENOKOT) 8.6 MG TABS tablet Take 1-2 tablets (8.6-17.2 mg total) by mouth daily. To prevent constipation (Patient not taking: Reported on 04/28/2023) 120 tablet 0   No current facility-administered medications for this visit.   Review of Systems General: no complaints  HEENT: no complaints  Lungs: no complaints  Cardiac: no complaints  GI: no complaints  GU: no complaints  Musculoskeletal: no complaints  Extremities: no complaints  Skin: no complaints  Neuro: no complaints  Endocrine: no complaints  Psych: no complaints         Objective:  Physical Examination:  Vitals:   05/13/23 0952  BP: (!) 144/96  Pulse: 79  Resp: 17  Temp: 98.6 F (37 C)  SpO2: 99%    Body mass index is 53.33 kg/m.  GENERAL: Patient is a well appearing female in no acute distress HEENT:  Atraumatic and normocephalic.  NODES:  No cervical, supraclavicular, axillary, or inguinal lymphadenopathy palpated.  LUNGS: normal respiratory effort ABDOMEN:  Soft, nontender. Nondistended. No masses/ascites/hernia/or hepatomegaly.  EXTREMITIES:  No significant edema.  Skin: Erythema noted around the umbilicus.  The area was probed the incision was noted to be intact.  NEURO:  Nonfocal. Well oriented.  Appropriate affect.  Pelvic: EGBUS: no lesions Cervix: surgically absent Vagina: no lesions, no discharge or bleeding Uterus: surgically absent BME: no palpable masses Rectovaginal: deferred    IMAGING:  As per HPI   Assessment:  Jean Morris is a 48 y.o. female with PMB, anemia and large uterus diagnosed with figo grade 2 endometrial cancer on endometrial biopsy 11/23.  Stage IVB based on suspicious lung lesion, L2 vertebral lesion, significantly enlarged left aortic adenopathy consistent with metastatic disease on imaging. Improved disease control s/p 6 cycles of chemotherapy with carbo/taxol and [place].  PET/CT 2/34 showed enlarged  uterus with hypermetabolic endometrial thickening and no other PET avid lesions.  In view of this, underwent TLH BSO 06/06/22 for disease control with excellent recovery.  Pathology showed grade 2 endometrioid cancer with 50% invasion, cervix, adnexa and washings negative.  PET scan 7/24 equivoval activity in retroperitoneal nodes.  Continues on Keytruda maintenance q6wks.     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  BRAF V600E not present. Her2 (1+). Estrogen Receptor:       POSITIVE, 90%, MODERATE STAINING INTENSITY Progesterone Receptor:   POSITIVE, 80%, STRONG STAINING INTENSITY  History of PE currently on Eliquis  Erythema around the elbow this may represent a candidiasis infection  Medical co-morbidities complicating care: HTN, Body mass index is 53.33  kg/m., prior surgery, PE while taking OCs. Plan:   Problem List Items Addressed This Visit       Genitourinary   Endometrial cancer (HCC) - Primary    Recommend continuing pembrolizumab maintenance with Dr Smith Robert her Medical Oncologist.  Repeat imaging per Dr Smith Robert.  Radiation therapy would be an option for additional treatment if there is concern for nodal disease in left PA node or other areas.  Would not necessarily radiate entire pelvic and aortic nodal area since she originally had adenopathy above that and it responded to systemic therapy.    If she develops progressive disease it would be an option to continue Pembrolizumab and add Lenvatanib.  In view of the endometrioid histology and positive ER/PR expression she could also be a candidate for a hormonal regimen such as alternating Megace and Tamoxifen or Everolimus/Letrozole.  Or consider clinical trials.   I recommended that she use nystatin on the umbilical area and also to clean and dry this area very well.  She enrolled on the [employer] consortium study.   The patient's diagnosis, an outline of the further diagnostic and laboratory  studies which will be required, the recommendation for surgery, and alternatives were discussed with her and her accompanying family members.  All questions were answered to their satisfaction.  Follow-up in 3 months for continued close surveillance.  Tore Carreker Leta Jungling, MD

## 2023-05-14 ENCOUNTER — Other Ambulatory Visit: Payer: Self-pay

## 2023-05-18 ENCOUNTER — Other Ambulatory Visit

## 2023-06-10 ENCOUNTER — Encounter: Payer: Self-pay | Admitting: Oncology

## 2023-06-10 ENCOUNTER — Inpatient Hospital Stay (HOSPITAL_BASED_OUTPATIENT_CLINIC_OR_DEPARTMENT_OTHER): Admitting: Oncology

## 2023-06-10 ENCOUNTER — Inpatient Hospital Stay: Attending: Obstetrics and Gynecology

## 2023-06-10 ENCOUNTER — Inpatient Hospital Stay

## 2023-06-10 VITALS — BP 127/81 | HR 84 | Temp 98.0°F | Resp 18 | Ht 65.0 in | Wt 328.0 lb

## 2023-06-10 DIAGNOSIS — C541 Malignant neoplasm of endometrium: Secondary | ICD-10-CM

## 2023-06-10 DIAGNOSIS — D649 Anemia, unspecified: Secondary | ICD-10-CM | POA: Insufficient documentation

## 2023-06-10 DIAGNOSIS — Z90710 Acquired absence of both cervix and uterus: Secondary | ICD-10-CM | POA: Insufficient documentation

## 2023-06-10 DIAGNOSIS — Z79899 Other long term (current) drug therapy: Secondary | ICD-10-CM | POA: Diagnosis not present

## 2023-06-10 DIAGNOSIS — Z86711 Personal history of pulmonary embolism: Secondary | ICD-10-CM | POA: Diagnosis not present

## 2023-06-10 DIAGNOSIS — Z7901 Long term (current) use of anticoagulants: Secondary | ICD-10-CM | POA: Diagnosis not present

## 2023-06-10 DIAGNOSIS — I10 Essential (primary) hypertension: Secondary | ICD-10-CM | POA: Diagnosis not present

## 2023-06-10 DIAGNOSIS — Z90722 Acquired absence of ovaries, bilateral: Secondary | ICD-10-CM | POA: Diagnosis not present

## 2023-06-10 DIAGNOSIS — Z791 Long term (current) use of non-steroidal anti-inflammatories (NSAID): Secondary | ICD-10-CM | POA: Diagnosis not present

## 2023-06-10 DIAGNOSIS — Z5112 Encounter for antineoplastic immunotherapy: Secondary | ICD-10-CM

## 2023-06-10 DIAGNOSIS — C772 Secondary and unspecified malignant neoplasm of intra-abdominal lymph nodes: Secondary | ICD-10-CM | POA: Insufficient documentation

## 2023-06-10 LAB — CBC WITH DIFFERENTIAL/PLATELET
Abs Immature Granulocytes: 0.06 10*3/uL (ref 0.00–0.07)
Basophils Absolute: 0 10*3/uL (ref 0.0–0.1)
Basophils Relative: 0 %
Eosinophils Absolute: 0.2 10*3/uL (ref 0.0–0.5)
Eosinophils Relative: 4 %
HCT: 35.7 % — ABNORMAL LOW (ref 36.0–46.0)
Hemoglobin: 12.8 g/dL (ref 12.0–15.0)
Immature Granulocytes: 1 %
Lymphocytes Relative: 33 %
Lymphs Abs: 1.9 10*3/uL (ref 0.7–4.0)
MCH: 27.8 pg (ref 26.0–34.0)
MCHC: 35.9 g/dL (ref 30.0–36.0)
MCV: 77.6 fL — ABNORMAL LOW (ref 80.0–100.0)
Monocytes Absolute: 0.3 10*3/uL (ref 0.1–1.0)
Monocytes Relative: 5 %
Neutro Abs: 3.2 10*3/uL (ref 1.7–7.7)
Neutrophils Relative %: 57 %
Platelets: 268 10*3/uL (ref 150–400)
RBC: 4.6 MIL/uL (ref 3.87–5.11)
RDW: 14.6 % (ref 11.5–15.5)
WBC: 5.6 10*3/uL (ref 4.0–10.5)
nRBC: 0 % (ref 0.0–0.2)

## 2023-06-10 LAB — COMPREHENSIVE METABOLIC PANEL WITH GFR
ALT: 13 U/L (ref 0–44)
AST: 16 U/L (ref 15–41)
Albumin: 3.3 g/dL — ABNORMAL LOW (ref 3.5–5.0)
Alkaline Phosphatase: 76 U/L (ref 38–126)
Anion gap: 8 (ref 5–15)
BUN: 17 mg/dL (ref 6–20)
CO2: 24 mmol/L (ref 22–32)
Calcium: 9 mg/dL (ref 8.9–10.3)
Chloride: 105 mmol/L (ref 98–111)
Creatinine, Ser: 0.86 mg/dL (ref 0.44–1.00)
GFR, Estimated: >60 mL/min (ref >60)
Glucose, Bld: 96 mg/dL (ref 70–99)
Potassium: 3.8 mmol/L (ref 3.5–5.1)
Sodium: 137 mmol/L (ref 135–145)
Total Bilirubin: 0.6 mg/dL (ref 0.0–1.2)
Total Protein: 7.6 g/dL (ref 6.5–8.1)

## 2023-06-10 MED ORDER — SODIUM CHLORIDE 0.9% FLUSH
10.0000 mL | INTRAVENOUS | Status: DC | PRN
  Administered 2023-06-10: 10 mL via INTRAVENOUS
  Filled 2023-06-10: qty 10

## 2023-06-10 MED ORDER — SODIUM CHLORIDE 0.9% FLUSH
10.0000 mL | INTRAVENOUS | Status: DC | PRN
  Filled 2023-06-10: qty 10

## 2023-06-10 MED ORDER — HEPARIN SOD (PORK) LOCK FLUSH 100 UNIT/ML IV SOLN
500.0000 [IU] | Freq: Once | INTRAVENOUS | Status: AC | PRN
  Administered 2023-06-10: 500 [IU]
  Filled 2023-06-10: qty 5

## 2023-06-10 MED ORDER — SODIUM CHLORIDE 0.9 % IV SOLN
400.0000 mg | Freq: Once | INTRAVENOUS | Status: AC
  Administered 2023-06-10: 400 mg via INTRAVENOUS
  Filled 2023-06-10: qty 16

## 2023-06-10 MED ORDER — SODIUM CHLORIDE 0.9 % IV SOLN
Freq: Once | INTRAVENOUS | Status: AC
  Filled 2023-06-10: qty 250

## 2023-06-10 MED ORDER — DEXAMETHASONE SODIUM PHOSPHATE 10 MG/ML IJ SOLN
10.0000 mg | Freq: Once | INTRAMUSCULAR | Status: AC
  Administered 2023-06-10: 10 mg via INTRAVENOUS
  Filled 2023-06-10: qty 1

## 2023-06-10 NOTE — Progress Notes (Signed)
 Hematology/Oncology Consult note [facility]  Telephone:([phone] [phone]  Patient Care Team: Center, [facility] as PCP - General (General Practice) [name], RN as Oncology Nurse Navigator [name], MD as Medical Oncologist (Oncology)   Name of the patient: Jean Morris  [id number]  1975/08/11   Date of visit: 06/10/23  Diagnosis- metastatic stage IVb endometrioid endometrial cancer   Chief complaint/ Reason for visit-on treatment assessment prior to cycle 11 palliative Keytruda  Heme/Onc history: Patient is a 48 year old female who has seen Dr. Logan Bores for irregular menstrual bleeding.  He has had an IUD in place and initially it was attribute it to use of IUD and was tried to be controlled with progesterone agents.  However due to worsening abdominal pain patient underwent a CT abdomen and pelvis with contrast on 12/25/2021 which showed 8 mm   Posterior lung base mass.  Retroperitoneal adenopathy measuring 2.6 cm additional retrocrural adenopathy.  Faint lucent lesion involving L2 vertebral body.  The uterus is anteverted and enlarged.  Endometrium is enlarged heterogeneous and irregular extending to the fundal myometrium.  Findings consistent with endometrial malignancy.  CT chest without contrast showed a 10 mm right lower lobe subpleural nodule.   Patient had endometrial biopsy which was consistent FIGO grade 2 endometrioid endometrial carcinoma.  Patient was seen by GYN oncology and hopes to take her for surgery if she has good response to neoadjuvant chemotherapy.MSI unstable.  BRAF negative.  Loss of major and minor MMR proteins MLH1 and PMS2 less than 5% of tumor expression.  MLH1 hyper methylation present.  This is most likely due to somatic epigenetic modification which can be seen in about 77% of sporadic endometrial cancers.   Patient completed 6 cycles of CarboTaxol Keytruda chemotherapy and is currently  on maintenance Keytruda.  PET CT scan on 05/19/2022 showed enlarged uterus with hypermetabolic endometrial thickening.  Improving left periaortic nodal metastases.  No definitive bone metastases was noted.  Plan is to proceed with cytoreductive surgery at [facility].  Patient underwent hysterectomy and bilateral salpingo-oophorectomy on 06/10/2022.  Final pathology showed residual endometrioid adenocarcinoma of the endometrium FIGO grade 2 with marked treatment effect invading 18 mm and a 34 mm thick myometrium.  Fallopian tubes and ovaries were negative for malignancy.  Pelvic washings were negative for malignancy.   Patient is currently on maintenance Keytruda and plan is to do 14 maintenance cycles  Interval history-she received facet injections for L3-L4 arthropathy.  It helped her for about 2 weeks and then the pain came back although it is intermittent and sometimes when she sits up for a long time.  ECOG PS- 1 Pain scale- 3   Review of systems- Review of Systems  Constitutional:  Negative for chills, fever, malaise/fatigue and weight loss.  HENT:  Negative for congestion, ear discharge and nosebleeds.   Eyes:  Negative for blurred vision.  Respiratory:  Negative for cough, hemoptysis, sputum production, shortness of breath and wheezing.   Cardiovascular:  Negative for chest pain, palpitations, orthopnea and claudication.  Gastrointestinal:  Negative for abdominal pain, blood in stool, constipation, diarrhea, heartburn, melena, nausea and vomiting.  Genitourinary:  Negative for dysuria, flank pain, frequency, hematuria and urgency.  Musculoskeletal:  Positive for back pain. Negative for joint pain and myalgias.  Skin:  Negative for rash.  Neurological:  Negative for dizziness, tingling, focal weakness, seizures, weakness and headaches.  Endo/Heme/Allergies:  Does not bruise/bleed easily.  Psychiatric/Behavioral:  Negative for depression  and suicidal ideas. The patient does not have insomnia.        Allergies  Allergen Reactions   Bee Pollen Nausea And Vomiting    Itchy, swelling, watery eyes, runny nose.   Pollen Extract Nausea And Vomiting    Itchy, swelling, watery eyes, runny nose.     Past Medical History:  Diagnosis Date   Anemia    Endometrial cancer (HCC)    Hypertension    Menorrhagia    Pulmonary embolism [facility])      Past Surgical History:  Procedure Laterality Date   ABDOMINAL HYSTERECTOMY Bilateral 06/10/2022   at [facility]   CESAREAN SECTION  02/25/1992   COLONOSCOPY WITH PROPOFOL N/A 08/06/2021   Procedure: COLONOSCOPY WITH PROPOFOL;  Surgeon: Luke Salaam, MD;  Location: [facility] ENDOSCOPY;  Service: Gastroenterology;  Laterality: N/A;   ESOPHAGOGASTRODUODENOSCOPY (EGD) WITH PROPOFOL N/A 02/09/2020   Procedure: ESOPHAGOGASTRODUODENOSCOPY (EGD) WITH PROPOFOL;  Surgeon: Luke Salaam, MD;  Location: [facility] ENDOSCOPY;  Service: Gastroenterology;  Laterality: N/A;   IR IMAGING GUIDED PORT INSERTION  01/17/2022   IR RADIOLOGIST EVAL & MGMT  04/28/2023   IR RADIOLOGIST EVAL & MGMT  05/05/2023    Social History   Socioeconomic History   Marital status: Media planner    Spouse name: Not on file   Number of children: Not on file   Years of education: Not on file   Highest education level: Not on file  Occupational History   Not on file  Tobacco Use   Smoking status: Never   Smokeless tobacco: Never  Vaping Use   Vaping status: Never Used  Substance and Sexual Activity   Alcohol use: No   Drug use: No   Sexual activity: Not Currently    Birth control/protection: I.U.D.    Comment: patient states MD said did not see in CT today 01/17/2022  Other Topics Concern   Not on file  Social History Narrative   Lives with [facility], partner, and no pets.   Social Drivers of Corporate investment banker Strain: Not on file  Food Insecurity: Not on file  Transportation Needs: Not on file  Physical Activity: Not on file  Stress: Not on file  Social Connections: Not on  file  Intimate Partner Violence: Not on file    Family History  Problem Relation Age of Onset   Hypertension Mother    Stroke Mother    Heart attack Mother    Cancer Father    Hypertension Father    Cancer Sister    Cancer Sister    Cancer Brother    Hypertension Other      Current Outpatient Medications:    benzonatate (TESSALON) 100 MG capsule, Take 1 capsule (100 mg total) by mouth 3 (three) times daily as needed for cough. (Patient not taking: Reported on 08/20/2022), Disp: 20 capsule, Rfl: 0   diclofenac Sodium (VOLTAREN) 1 % GEL, Apply 4 g topically 4 (four) times daily., Disp: 50 g, Rfl: 0   ELIQUIS 2.5 MG TABS tablet, TAKE 1 TABLET BY MOUTH TWICE A DAY, Disp: 60 tablet, Rfl: 3   fluconazole (DIFLUCAN) 150 MG tablet, Take 1 tablet (150 mg total) by mouth daily. Take 1 tablet (150 mg) by mouth. Three days later, take second tablet. (Patient not taking: Reported on 04/28/2023), Disp: 2 tablet, Rfl: 0   gabapentin (NEURONTIN) 300 MG capsule, Take 1 capsule (300 mg total) by mouth 3 (three) times daily. Start with once a day and gradually increase to TID over  1 week, Disp: 90 capsule, Rfl: 0   KLOR-CON M20 20 MEQ tablet, TAKE 1 TABLET (20 MEQ TOTAL) BY MOUTH 2 (TWO) TIMES DAILY. DISSOLVE TABLETS, Disp: 60 tablet, Rfl: 2   levonorgestrel (MIRENA) 20 MCG/24HR IUD, 1 Intra Uterine Device (1 each total) by Intrauterine route once., Disp: 1 each, Rfl: 0   lidocaine-prilocaine (EMLA) cream, Apply to port site as directed, Disp: 30 g, Rfl: 3   LORazepam (ATIVAN) 0.5 MG tablet, Take 1 tablet (0.5 mg total) by mouth every 8 (eight) hours., Disp: 30 tablet, Rfl: 0   magnesium hydroxide (MILK OF MAGNESIA) 400 MG/5ML suspension, Take 15 mLs by mouth daily as needed for moderate constipation. Take if no bowel movement for 2 days., Disp: , Rfl:    methocarbamol (ROBAXIN) 500 MG tablet, Take 1 tablet (500 mg total) by mouth 2 (two) times daily as needed for muscle spasms. (Patient not taking: Reported  on 04/28/2023), Disp: 30 tablet, Rfl: 0   nystatin (MYCOSTATIN/NYSTOP) powder, Apply 1 Application topically 3 (three) times daily., Disp: 30 g, Rfl: 3   oxyCODONE (OXY IR/ROXICODONE) 5 MG immediate release tablet, Take 1 tablet (5 mg total) by mouth every 4 (four) hours as needed for severe pain (pain score 7-10)., Disp: 60 tablet, Rfl: 0   pantoprazole (PROTONIX) 40 MG tablet, Take 1 tablet (40 mg total) by mouth daily. (Patient not taking: Reported on 04/28/2023), Disp: 30 tablet, Rfl: 2   polyethylene glycol (MIRALAX) 17 g packet, Take 17 g by mouth daily. To prevent constipation, Disp: 30 each, Rfl: 3   prochlorperazine (COMPAZINE) 10 MG tablet, Take 1 tablet (10 mg total) by mouth every 6 (six) hours as needed for nausea or vomiting. (Patient not taking: Reported on 04/28/2023), Disp: 30 tablet, Rfl: 1   senna (SENOKOT) 8.6 MG TABS tablet, Take 1-2 tablets (8.6-17.2 mg total) by mouth daily. To prevent constipation (Patient not taking: Reported on 04/28/2023), Disp: 120 tablet, Rfl: 0 No current facility-administered medications for this visit.  Facility-Administered Medications Ordered in Other Visits:    sodium chloride flush (NS) 0.9 % injection 10 mL, 10 mL, Intravenous, PRN, Avonne Boettcher, MD  Physical exam:  Vitals:   06/10/23 0903  BP: 127/81  Pulse: 84  Resp: 18  Temp: 98 F (36.7 C)  TempSrc: Tympanic  SpO2: 98%  Weight: (!) 328 lb (148.8 kg)  Height: 5\' 5"  (1.651 m)   Physical Exam Cardiovascular:     Rate and Rhythm: Normal rate and regular rhythm.     Heart sounds: Normal heart sounds.  Pulmonary:     Effort: Pulmonary effort is normal.     Breath sounds: Normal breath sounds.  Abdominal:     General: Bowel sounds are normal.     Palpations: Abdomen is soft.  Skin:    General: Skin is warm and dry.  Neurological:     Mental Status: She is alert and oriented to person, place, and time.      I have personally reviewed labs listed below:    Latest Ref Rng & Units  04/29/2023    8:41 AM  CMP  Glucose 70 - 99 mg/dL 90   BUN 6 - 20 mg/dL 14   Creatinine 9.14 - 1.00 mg/dL 7.82   Sodium 956 - 213 mmol/L 134   Potassium 3.5 - 5.1 mmol/L 3.5   Chloride 98 - 111 mmol/L 101   CO2 22 - 32 mmol/L 26   Calcium 8.9 - 10.3 mg/dL 8.8   Total  Protein 6.5 - 8.1 g/dL 7.6   Total Bilirubin 0.0 - 1.2 mg/dL 0.7   Alkaline Phos 38 - 126 U/L 73   AST 15 - 41 U/L 17   ALT 0 - 44 U/L 12       Latest Ref Rng & Units 04/29/2023    8:41 AM  CBC  WBC 4.0 - 10.5 K/uL 5.5   Hemoglobin 12.0 - 15.0 g/dL 16.1   Hematocrit 09.6 - 46.0 % 34.3   Platelets 150 - 400 K/uL 282    I have personally reviewed Radiology images listed below: No images are attached to the encounter.  DG FACET JT INJ L /S SINGLE LEVEL LEFT W/FL/CT Result Date: 05/13/2023 CLINICAL DATA:  Mechanical low back pain. EXAM: DG FACET JT INJ L OR S SPINE SINGLE LEVEL UNILATERAL COMPARISON:  05/12/2023 PROCEDURE: Please see report from same day for description of both injections. IMPRESSION: Please see report from same day which describes both injections. Electronically Signed   By: Paulina Fusi M.D.   On: 05/13/2023 09:44   DG FACET JT INJ L /S SINGLE LEVEL LEFT W/FL/CT Result Date: 05/12/2023 CLINICAL DATA:  48 year old female with mechanical lower back pain secondary to bilateral L4-L5 and L5-S1 facet arthropathy. Today, her pain is worse on the left than the right. Therefore, we will proceed with left L4-L5 and left L5-S1 facet injections. EXAM: DG FACET JT INJ L OR S SPINE SINGLE LEVEL UNILATERAL DG FACET JT INJ L OR S SPINE SINGLE LEVEL UNILATERAL PROCEDURE: The procedure, risks, benefits, and alternatives were explained to the patient. Questions regarding the procedure were encouraged and answered. The patient understands and consents to the procedure. Left L5-S1 FACET INJECTION: A posterior oblique approach was taken to the facet on the left at L5-S1 using a curved 6 inch 22 gauge spinal needle. 40 mg of  Depo-Medrol mixed with 1.5 mL 0.5% bupivacaine were instilled into the joint. The injection resulted in concordant pain. The procedure was well-tolerated. Left L4-L5 FACET INJECTION: A posterior oblique approach was taken to the facet on the left at L4-L5 using a curved 6 inch 22 gauge spinal needle. Intra-articular positioning was confirmed by injecting a small amount of Isovue-M 200. No vascular opacification is seen. 40 mg of Depo-Medrol mixed with 1.5 mL 0.5% bupivacaine were instilled into the joint. The injection resulted in concordant pain. The procedure was well-tolerated. IMPRESSION: Technically successful left L5-S1 facet injection. Technically successful left L4-L5 facet injection. Electronically Signed   By: Malachy Moan M.D.   On: 05/12/2023 12:19     Assessment and plan- Patient is a 48 y.o. female with history of stage IVb endometrioid endometrial cancer here for on treatment assessment prior to cycle 11 of palliative Keytruda  Counts okay to proceed with cycle 11 of Keytruda today and I will see her back in 6 weeks for cycle 12.  I will repeat scans after 14 cycles roughly in 2 months time.  Patient was found to have L2 vertebral body fracture and it is unclear if it is pathologic versus degenerative etiology.  She was referred to interventional radiology and she had an MRI of her lumbar spine as well.  Based on MRI findings the cause of her back pain was attributed to facet arthropathy at L4-L5 and L5-S1 and she was going to be getting facet injections instead of intervention at the L2 vertebral area.  We will assess this further and have her subsequent scans as well.   Visit Diagnosis 1.  Encounter for antineoplastic immunotherapy   2. Endometrial cancer [facility])      Dr. Seretha Dance, MD, MPH [facility] at [facility] [phone] 06/10/2023 8:45 AM

## 2023-06-10 NOTE — Patient Instructions (Signed)

## 2023-06-24 NOTE — Discharge Instructions (Addendum)
 Post Procedure Spinal Discharge Instruction Sheet  You may resume a regular diet and any medications that you routinely take (including pain medications) unless otherwise noted by MD.  No driving day of procedure.  Light activity throughout the rest of the day.  Do not do any strenuous work, exercise, bending or lifting.  The day following the procedure, you can resume normal physical activity but you should refrain from exercising or physical therapy for at least three days thereafter.  You may apply ice to the injection site, 20 minutes on, 20 minutes off, as needed. Do not apply ice directly to skin.    Common Side Effects:  Headaches- take your usual medications as directed by your physician.  Increase your fluid intake.  Caffeinated beverages may be helpful.  Lie flat in bed until your headache resolves.  Restlessness or inability to sleep- you may have trouble sleeping for the next few days.  Ask your referring physician if you need any medication for sleep.  Facial flushing or redness- should subside within a few days.  Increased pain- a temporary increase in pain a day or two following your procedure is not unusual.  Take your pain medication as prescribed by your referring physician.  Leg cramps  Please contact our office at [phone] for the following symptoms: Fever greater than 100 degrees. Headaches unresolved with medication after 2-3 days. Increased swelling, pain, or redness at injection site.   YOU MAY RESUME TAKING YOUR ELIQUIS  IN 24 HOURS.  Thank you for visiting [facility] today.

## 2023-06-26 ENCOUNTER — Inpatient Hospital Stay
Admission: RE | Admit: 2023-06-26 | Discharge: 2023-06-26 | Disposition: A | Discharge status: Home / Self Care | Source: Ambulatory Visit | Attending: Nurse Practitioner | Admitting: Nurse Practitioner

## 2023-06-26 ENCOUNTER — Inpatient Hospital Stay: Admission: RE | Admit: 2023-06-26 | Source: Ambulatory Visit

## 2023-06-30 ENCOUNTER — Telehealth: Payer: Self-pay | Admitting: *Deleted

## 2023-06-30 NOTE — Discharge Instructions (Signed)
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
## 2023-07-01 ENCOUNTER — Inpatient Hospital Stay: Admission: RE | Admit: 2023-07-01 | Source: Ambulatory Visit

## 2023-07-01 ENCOUNTER — Inpatient Hospital Stay
Admission: RE | Admit: 2023-07-01 | Discharge: 2023-07-01 | Disposition: A | Discharge status: Home / Self Care | Source: Ambulatory Visit | Attending: Nurse Practitioner | Admitting: Nurse Practitioner

## 2023-07-14 ENCOUNTER — Encounter: Payer: Self-pay | Admitting: Oncology

## 2023-07-22 ENCOUNTER — Encounter: Payer: Self-pay | Admitting: Oncology

## 2023-07-22 ENCOUNTER — Inpatient Hospital Stay: Attending: Obstetrics and Gynecology

## 2023-07-22 ENCOUNTER — Inpatient Hospital Stay (HOSPITAL_BASED_OUTPATIENT_CLINIC_OR_DEPARTMENT_OTHER): Admitting: Oncology

## 2023-07-22 ENCOUNTER — Inpatient Hospital Stay

## 2023-07-22 VITALS — BP 122/85 | HR 91 | Temp 98.1°F | Resp 19 | Ht 65.0 in | Wt 348.0 lb

## 2023-07-22 DIAGNOSIS — Z5112 Encounter for antineoplastic immunotherapy: Secondary | ICD-10-CM | POA: Diagnosis not present

## 2023-07-22 DIAGNOSIS — C541 Malignant neoplasm of endometrium: Secondary | ICD-10-CM

## 2023-07-22 DIAGNOSIS — R918 Other nonspecific abnormal finding of lung field: Secondary | ICD-10-CM | POA: Insufficient documentation

## 2023-07-22 DIAGNOSIS — Z809 Family history of malignant neoplasm, unspecified: Secondary | ICD-10-CM | POA: Diagnosis not present

## 2023-07-22 DIAGNOSIS — T451X5A Adverse effect of antineoplastic and immunosuppressive drugs, initial encounter: Secondary | ICD-10-CM | POA: Insufficient documentation

## 2023-07-22 DIAGNOSIS — Z90710 Acquired absence of both cervix and uterus: Secondary | ICD-10-CM | POA: Diagnosis not present

## 2023-07-22 DIAGNOSIS — I10 Essential (primary) hypertension: Secondary | ICD-10-CM | POA: Insufficient documentation

## 2023-07-22 DIAGNOSIS — G620 Drug-induced polyneuropathy: Secondary | ICD-10-CM | POA: Insufficient documentation

## 2023-07-22 DIAGNOSIS — C772 Secondary and unspecified malignant neoplasm of intra-abdominal lymph nodes: Secondary | ICD-10-CM | POA: Diagnosis not present

## 2023-07-22 DIAGNOSIS — Z86711 Personal history of pulmonary embolism: Secondary | ICD-10-CM | POA: Diagnosis not present

## 2023-07-22 DIAGNOSIS — Z79899 Other long term (current) drug therapy: Secondary | ICD-10-CM | POA: Insufficient documentation

## 2023-07-22 DIAGNOSIS — Z90722 Acquired absence of ovaries, bilateral: Secondary | ICD-10-CM | POA: Insufficient documentation

## 2023-07-22 DIAGNOSIS — M545 Low back pain, unspecified: Secondary | ICD-10-CM | POA: Diagnosis not present

## 2023-07-22 DIAGNOSIS — N920 Excessive and frequent menstruation with regular cycle: Secondary | ICD-10-CM | POA: Diagnosis not present

## 2023-07-22 DIAGNOSIS — Z791 Long term (current) use of non-steroidal anti-inflammatories (NSAID): Secondary | ICD-10-CM | POA: Diagnosis not present

## 2023-07-22 DIAGNOSIS — Z7901 Long term (current) use of anticoagulants: Secondary | ICD-10-CM | POA: Diagnosis not present

## 2023-07-22 DIAGNOSIS — D649 Anemia, unspecified: Secondary | ICD-10-CM | POA: Diagnosis not present

## 2023-07-22 LAB — CBC WITH DIFFERENTIAL/PLATELET
Abs Immature Granulocytes: 0.06 10*3/uL (ref 0.00–0.07)
Basophils Absolute: 0 10*3/uL (ref 0.0–0.1)
Basophils Relative: 1 %
Eosinophils Absolute: 0.3 10*3/uL (ref 0.0–0.5)
Eosinophils Relative: 4 %
HCT: 36.5 % (ref 36.0–46.0)
Hemoglobin: 12.8 g/dL (ref 12.0–15.0)
Immature Granulocytes: 1 %
Lymphocytes Relative: 29 %
Lymphs Abs: 2.1 10*3/uL (ref 0.7–4.0)
MCH: 26.5 pg (ref 26.0–34.0)
MCHC: 35.1 g/dL (ref 30.0–36.0)
MCV: 75.6 fL — ABNORMAL LOW (ref 80.0–100.0)
Monocytes Absolute: 0.4 10*3/uL (ref 0.1–1.0)
Monocytes Relative: 6 %
Neutro Abs: 4.3 10*3/uL (ref 1.7–7.7)
Neutrophils Relative %: 59 %
Platelets: 317 10*3/uL (ref 150–400)
RBC: 4.83 MIL/uL (ref 3.87–5.11)
RDW: 14.8 % (ref 11.5–15.5)
WBC: 7.1 10*3/uL (ref 4.0–10.5)
nRBC: 0 % (ref 0.0–0.2)

## 2023-07-22 LAB — COMPREHENSIVE METABOLIC PANEL WITH GFR
ALT: 11 U/L (ref 0–44)
AST: 19 U/L (ref 15–41)
Albumin: 3.7 g/dL (ref 3.5–5.0)
Alkaline Phosphatase: 73 U/L (ref 38–126)
Anion gap: 8 (ref 5–15)
BUN: 15 mg/dL (ref 6–20)
CO2: 24 mmol/L (ref 22–32)
Calcium: 8.9 mg/dL (ref 8.9–10.3)
Chloride: 103 mmol/L (ref 98–111)
Creatinine, Ser: 0.83 mg/dL (ref 0.44–1.00)
GFR, Estimated: >60 mL/min (ref >60)
Glucose, Bld: 98 mg/dL (ref 70–99)
Potassium: 3.7 mmol/L (ref 3.5–5.1)
Sodium: 135 mmol/L (ref 135–145)
Total Bilirubin: 0.7 mg/dL (ref 0.0–1.2)
Total Protein: 7.7 g/dL (ref 6.5–8.1)

## 2023-07-22 MED ORDER — SODIUM CHLORIDE 0.9 % IV SOLN
Freq: Once | INTRAVENOUS | Status: AC
  Filled 2023-07-22: qty 250

## 2023-07-22 MED ORDER — SODIUM CHLORIDE 0.9% FLUSH
10.0000 mL | INTRAVENOUS | Status: DC | PRN
  Administered 2023-07-22: 10 mL
  Filled 2023-07-22: qty 10

## 2023-07-22 MED ORDER — HEPARIN SOD (PORK) LOCK FLUSH 100 UNIT/ML IV SOLN
500.0000 [IU] | Freq: Once | INTRAVENOUS | Status: AC | PRN
  Administered 2023-07-22: 500 [IU]
  Filled 2023-07-22: qty 5

## 2023-07-22 MED ORDER — SODIUM CHLORIDE 0.9 % IV SOLN
400.0000 mg | Freq: Once | INTRAVENOUS | Status: AC
  Administered 2023-07-22: 400 mg via INTRAVENOUS
  Filled 2023-07-22: qty 16

## 2023-07-22 MED ORDER — DEXAMETHASONE SODIUM PHOSPHATE 10 MG/ML IJ SOLN
10.0000 mg | Freq: Once | INTRAMUSCULAR | Status: AC
  Administered 2023-07-22: 10 mg via INTRAVENOUS
  Filled 2023-07-22: qty 1

## 2023-07-22 MED ORDER — GABAPENTIN 300 MG PO CAPS: 300.0000 mg | ORAL_CAPSULE | Freq: Three times a day (TID) | ORAL | 0 refills | Status: DC

## 2023-07-22 NOTE — Progress Notes (Signed)
 Hematology/Oncology Consult note [facility]  [phone] [phone]  Patient Care Team: Center, [facility] as PCP - General (General Practice) Rochell Chroman, RN as Oncology Nurse Navigator Avonne Boettcher, MD as Medical Oncologist (Oncology)   Name of the patient: Jean Morris  [id number]  May 22, 1975   Date of visit: 07/22/23  Diagnosis- metastatic stage IVb endometrioid endometrial cancer   Chief complaint/ Reason for visit-on treatment assessment prior to cycle 12 of palliative Keytruda   Heme/Onc history: Patient is a 48 year old female who has seen Dr. Luster Salters for irregular menstrual bleeding.  He has had an IUD in place and initially it was attribute it to use of IUD and was tried to be controlled with progesterone agents.  However due to worsening abdominal pain patient underwent a CT abdomen and pelvis with contrast on 12/25/2021 which showed 8 mm   Posterior lung base mass.  Retroperitoneal adenopathy measuring 2.6 cm additional retrocrural adenopathy.  Faint lucent lesion involving L2 vertebral body.  The uterus is anteverted and enlarged.  Endometrium is enlarged heterogeneous and irregular extending to the fundal myometrium.  Findings consistent with endometrial malignancy.  CT chest without contrast showed a 10 mm right lower lobe subpleural nodule.   Patient had endometrial biopsy which was consistent FIGO grade 2 endometrioid endometrial carcinoma.  Patient was seen by GYN oncology and hopes to take her for surgery if she has good response to neoadjuvant chemotherapy.MSI unstable.  BRAF negative.  Loss of major and minor MMR proteins MLH1 and PMS2 less than 5% of tumor expression.  MLH1 hyper methylation present.  This is most likely due to somatic epigenetic modification which can be seen in about 77% of sporadic endometrial cancers.   Patient completed 6 cycles of CarboTaxol Keytruda  chemotherapy and is  currently on maintenance Keytruda .  PET CT scan on 05/19/2022 showed enlarged uterus with hypermetabolic endometrial thickening.  Improving left periaortic nodal metastases.  No definitive bone metastases was noted.  Plan is to proceed with cytoreductive surgery at [facility].  Patient underwent hysterectomy and bilateral salpingo-oophorectomy on 06/10/2022.  Final pathology showed residual endometrioid adenocarcinoma of the endometrium FIGO grade 2 with marked treatment effect invading 18 mm and a 34 mm thick myometrium.  Fallopian tubes and ovaries were negative for malignancy.  Pelvic washings were negative for malignancy.   Patient is currently on maintenance Keytruda  and plan is to do 14 maintenance cycles  Interval history-patient is concerned about her ongoing weight gain.  She has gained 28 pounds since March 2025 and is up to 348 pounds.  She is concerned of gabapentin  can lead to weight gain although it has been working well for her neuropathy.  She has been using oxycodone  occasionally for her back pain as well as for her neuropathy  ECOG PS- 1 Pain scale- 2 Opioid associated constipation- no  Review of systems- Review of Systems  Constitutional:  Negative for chills, fever, malaise/fatigue and weight loss.  HENT:  Negative for congestion, ear discharge and nosebleeds.   Eyes:  Negative for blurred vision.  Respiratory:  Negative for cough, hemoptysis, sputum production, shortness of breath and wheezing.   Cardiovascular:  Negative for chest pain, palpitations, orthopnea and claudication.  Gastrointestinal:  Negative for abdominal pain, blood in stool, constipation, diarrhea, heartburn, melena, nausea and vomiting.  Genitourinary:  Negative for dysuria, flank pain, frequency, hematuria and urgency.  Musculoskeletal:  Negative for back pain, joint pain and myalgias.  Skin:  Negative for  rash.  Neurological:  Negative for dizziness, tingling, focal weakness, seizures, weakness and headaches.   Endo/Heme/Allergies:  Does not bruise/bleed easily.  Psychiatric/Behavioral:  Negative for depression and suicidal ideas. The patient does not have insomnia.       Allergies  Allergen Reactions   Bee Pollen Nausea And Vomiting    Itchy, swelling, watery eyes, runny nose.   Pollen Extract Nausea And Vomiting    Itchy, swelling, watery eyes, runny nose.     Past Medical History:  Diagnosis Date   Anemia    Endometrial cancer (HCC)    Hypertension    Menorrhagia    Pulmonary embolism [facility])      Past Surgical History:  Procedure Laterality Date   ABDOMINAL HYSTERECTOMY Bilateral 06/10/2022   at [facility]   CESAREAN SECTION  02/25/1992   COLONOSCOPY WITH PROPOFOL  N/A 08/06/2021   Procedure: COLONOSCOPY WITH PROPOFOL ;  Surgeon: Luke Salaam, MD;  Location: [facility] ENDOSCOPY;  Service: Gastroenterology;  Laterality: N/A;   ESOPHAGOGASTRODUODENOSCOPY (EGD) WITH PROPOFOL  N/A 02/09/2020   Procedure: ESOPHAGOGASTRODUODENOSCOPY (EGD) WITH PROPOFOL ;  Surgeon: Luke Salaam, MD;  Location: [facility] ENDOSCOPY;  Service: Gastroenterology;  Laterality: N/A;   IR IMAGING GUIDED PORT INSERTION  01/17/2022   IR RADIOLOGIST EVAL & MGMT  04/28/2023   IR RADIOLOGIST EVAL & MGMT  05/05/2023    Social History   Socioeconomic History   Marital status: Media planner    Spouse name: Not on file   Number of children: Not on file   Years of education: Not on file   Highest education level: Not on file  Occupational History   Not on file  Tobacco Use   Smoking status: Never   Smokeless tobacco: Never  Vaping Use   Vaping status: Never Used  Substance and Sexual Activity   Alcohol use: No   Drug use: No   Sexual activity: Not Currently    Birth control/protection: I.U.D.    Comment: patient states MD said did not see in CT today 01/17/2022  Other Topics Concern   Not on file  Social History Narrative   Lives with [facility], partner, and no pets.   Social Drivers of Corporate investment banker  Strain: Not on file  Food Insecurity: Not on file  Transportation Needs: Not on file  Physical Activity: Not on file  Stress: Not on file  Social Connections: Not on file  Intimate Partner Violence: Not on file    Family History  Problem Relation Age of Onset   Hypertension Mother    Stroke Mother    Heart attack Mother    Cancer Father    Hypertension Father    Cancer Sister    Cancer Sister    Cancer Brother    Hypertension Other      Current Outpatient Medications:    benzonatate  (TESSALON ) 100 MG capsule, Take 1 capsule (100 mg total) by mouth 3 (three) times daily as needed for cough. (Patient not taking: Reported on 08/20/2022), Disp: 20 capsule, Rfl: 0   diclofenac  Sodium (VOLTAREN ) 1 % GEL, Apply 4 g topically 4 (four) times daily., Disp: 50 g, Rfl: 0   ELIQUIS  2.5 MG TABS tablet, TAKE 1 TABLET BY MOUTH TWICE A DAY, Disp: 60 tablet, Rfl: 3   fluconazole  (DIFLUCAN ) 150 MG tablet, Take 1 tablet (150 mg total) by mouth daily. Take 1 tablet (150 mg) by mouth. Three days later, take second tablet. (Patient not taking: Reported on 04/28/2023), Disp: 2 tablet, Rfl: 0   gabapentin  (  NEURONTIN ) 300 MG capsule, Take 1 capsule (300 mg total) by mouth 3 (three) times daily. Start with once a day and gradually increase to TID over 1 week, Disp: 90 capsule, Rfl: 0   KLOR-CON  M20 20 MEQ tablet, TAKE 1 TABLET (20 MEQ TOTAL) BY MOUTH 2 (TWO) TIMES DAILY. DISSOLVE TABLETS, Disp: 60 tablet, Rfl: 2   levonorgestrel  (MIRENA ) 20 MCG/24HR IUD, 1 Intra Uterine Device (1 each total) by Intrauterine route once., Disp: 1 each, Rfl: 0   lidocaine -prilocaine  (EMLA ) cream, Apply to port site as directed, Disp: 30 g, Rfl: 3   LORazepam  (ATIVAN ) 0.5 MG tablet, Take 1 tablet (0.5 mg total) by mouth every 8 (eight) hours., Disp: 30 tablet, Rfl: 0   magnesium  hydroxide (MILK OF MAGNESIA) 400 MG/5ML suspension, Take 15 mLs by mouth daily as needed for moderate constipation. Take if no bowel movement for 2 days.,  Disp: , Rfl:    methocarbamol  (ROBAXIN ) 500 MG tablet, Take 1 tablet (500 mg total) by mouth 2 (two) times daily as needed for muscle spasms. (Patient not taking: Reported on 04/28/2023), Disp: 30 tablet, Rfl: 0   nystatin  (MYCOSTATIN /NYSTOP ) powder, Apply 1 Application topically 3 (three) times daily., Disp: 30 g, Rfl: 3   oxyCODONE  (OXY IR/ROXICODONE ) 5 MG immediate release tablet, Take 1 tablet (5 mg total) by mouth every 4 (four) hours as needed for severe pain (pain score 7-10)., Disp: 60 tablet, Rfl: 0   pantoprazole  (PROTONIX ) 40 MG tablet, Take 1 tablet (40 mg total) by mouth daily. (Patient not taking: Reported on 04/28/2023), Disp: 30 tablet, Rfl: 2   polyethylene glycol (MIRALAX ) 17 g packet, Take 17 g by mouth daily. To prevent constipation, Disp: 30 each, Rfl: 3   prochlorperazine  (COMPAZINE ) 10 MG tablet, Take 1 tablet (10 mg total) by mouth every 6 (six) hours as needed for nausea or vomiting. (Patient not taking: Reported on 04/28/2023), Disp: 30 tablet, Rfl: 1   senna (SENOKOT) 8.6 MG TABS tablet, Take 1-2 tablets (8.6-17.2 mg total) by mouth daily. To prevent constipation (Patient not taking: Reported on 04/28/2023), Disp: 120 tablet, Rfl: 0  Physical exam:  Vitals:   07/22/23 0916  BP: 122/85  Pulse: 91  Resp: 19  Temp: 98.1 F (36.7 C)  TempSrc: Tympanic  SpO2: 98%  Weight: (!) 348 lb (157.9 kg)  Height: 5\' 5"  (1.651 m)   Physical Exam Cardiovascular:     Rate and Rhythm: Normal rate and regular rhythm.     Heart sounds: Normal heart sounds.  Pulmonary:     Effort: Pulmonary effort is normal.     Breath sounds: Normal breath sounds.  Abdominal:     General: Bowel sounds are normal.     Palpations: Abdomen is soft.  Skin:    General: Skin is warm and dry.  Neurological:     Mental Status: She is alert and oriented to person, place, and time.     I have personally reviewed labs listed below:    Latest Ref Rng & Units 06/10/2023    8:47 AM  CMP  Glucose 70 - 99  mg/dL 96   BUN 6 - 20 mg/dL 17   Creatinine 0.86 - 1.00 mg/dL 5.78   Sodium 469 - 629 mmol/L 137   Potassium 3.5 - 5.1 mmol/L 3.8   Chloride 98 - 111 mmol/L 105   CO2 22 - 32 mmol/L 24   Calcium 8.9 - 10.3 mg/dL 9.0   Total Protein 6.5 - 8.1 g/dL 7.6  Total Bilirubin 0.0 - 1.2 mg/dL 0.6   Alkaline Phos 38 - 126 U/L 76   AST 15 - 41 U/L 16   ALT 0 - 44 U/L 13       Latest Ref Rng & Units 06/10/2023    8:47 AM  CBC  WBC 4.0 - 10.5 K/uL 5.6   Hemoglobin 12.0 - 15.0 g/dL 16.1   Hematocrit 09.6 - 46.0 % 35.7   Platelets 150 - 400 K/uL 268    I have personally reviewed Radiology images listed below: No images are attached to the encounter.  No results found.   Assessment and plan- Patient is a 48 y.o. female with history of stage I 4B endometrioid endometrial carcinoma here for on treatment assessment prior to cycle 12 of palliative Keytruda   Counts okay to proceed with cycle 12 of palliative Keytruda  today.  I will see her back in 6 weeks for cycle 13.  Will plan to repeat scans after 14 cycles.  If there is no evidence of recurrent or progressive disease I will consider stopping Keytruda  at that time.  Low back pain: Improved after receiving facet injections.  She is also on occasional oxycodone   Chemo-induced peripheral neuropathy: Patient would like to continue with gabapentin  for now.  It can be associated with weight gain and about 2 to 3% of patients but it is unclear if that is the sole reason.  We discussed switching from gabapentin  to duloxetine which can be associated with both weight gain or weight loss.  Patient does not want to make the switch just yet.  I have also recommended that she should talk to her primary care doctor about considering weight loss medications given that her BMI is 57 presently   Visit Diagnosis 1. Encounter for antineoplastic immunotherapy   2. Endometrial cancer (HCC)   3. Chemotherapy-induced peripheral neuropathy (HCC)      Dr. Seretha Dance, MD, MPH [facility] at [facility] [phone] 07/22/2023 9:00 AM

## 2023-07-23 ENCOUNTER — Telehealth: Payer: Self-pay | Admitting: *Deleted

## 2023-07-23 ENCOUNTER — Other Ambulatory Visit: Payer: Self-pay | Admitting: *Deleted

## 2023-07-23 ENCOUNTER — Other Ambulatory Visit: Payer: Self-pay

## 2023-07-23 MED ORDER — OXYCODONE HCL 5 MG PO TABS: 5.0000 mg | ORAL_TABLET | ORAL | 0 refills | Status: DC | PRN

## 2023-07-23 NOTE — Telephone Encounter (Signed)
 Patient called yesterday for refill of oxycodone 

## 2023-07-23 NOTE — Telephone Encounter (Signed)
 She was having UTI symptoms but today when she called about her refill of oxycodone  she says that she does not feel that anymore so she thinks that she does not need to come in and get the urine specimen that they did not get yesterday

## 2023-07-24 ENCOUNTER — Telehealth: Payer: Self-pay | Admitting: *Deleted

## 2023-07-24 MED ORDER — SULFAMETHOXAZOLE-TRIMETHOPRIM 800-160 MG PO TABS: 1.0000 | ORAL_TABLET | Freq: Two times a day (BID) | ORAL | 0 refills | Status: AC

## 2023-07-24 NOTE — Telephone Encounter (Signed)
 Bactrim DS for 5 days

## 2023-07-24 NOTE — Telephone Encounter (Signed)
 The patient said yesterday that she was having UTI  issues and then later in the day she said it was better. Today she called and said the it hurt to urinate and  little amount. Some tmes she has urgency to the bathroom. She feels that it is UTI. She wanted to get atb for her.

## 2023-08-18 ENCOUNTER — Other Ambulatory Visit: Payer: Self-pay

## 2023-08-18 ENCOUNTER — Other Ambulatory Visit: Payer: Self-pay | Admitting: Oncology

## 2023-08-18 ENCOUNTER — Encounter: Payer: Self-pay | Admitting: Oncology

## 2023-08-18 NOTE — Telephone Encounter (Signed)
 Closing charts/Signing encounter from 05/04/23

## 2023-08-19 ENCOUNTER — Encounter: Payer: Self-pay | Admitting: Obstetrics and Gynecology

## 2023-08-19 ENCOUNTER — Inpatient Hospital Stay: Attending: Obstetrics and Gynecology | Admitting: Obstetrics and Gynecology

## 2023-08-19 VITALS — BP 132/80 | HR 87 | Temp 96.9°F | Resp 17 | Wt 355.3 lb

## 2023-08-19 DIAGNOSIS — Z90722 Acquired absence of ovaries, bilateral: Secondary | ICD-10-CM | POA: Diagnosis not present

## 2023-08-19 DIAGNOSIS — Z8542 Personal history of malignant neoplasm of other parts of uterus: Secondary | ICD-10-CM | POA: Insufficient documentation

## 2023-08-19 DIAGNOSIS — Z90710 Acquired absence of both cervix and uterus: Secondary | ICD-10-CM | POA: Insufficient documentation

## 2023-08-19 DIAGNOSIS — Z9079 Acquired absence of other genital organ(s): Secondary | ICD-10-CM | POA: Diagnosis not present

## 2023-08-19 DIAGNOSIS — R1032 Left lower quadrant pain: Secondary | ICD-10-CM

## 2023-08-19 DIAGNOSIS — M549 Dorsalgia, unspecified: Secondary | ICD-10-CM | POA: Insufficient documentation

## 2023-08-19 DIAGNOSIS — R109 Unspecified abdominal pain: Secondary | ICD-10-CM | POA: Insufficient documentation

## 2023-08-19 DIAGNOSIS — M545 Low back pain, unspecified: Secondary | ICD-10-CM

## 2023-08-19 DIAGNOSIS — Z9221 Personal history of antineoplastic chemotherapy: Secondary | ICD-10-CM | POA: Insufficient documentation

## 2023-08-19 DIAGNOSIS — C541 Malignant neoplasm of endometrium: Secondary | ICD-10-CM | POA: Diagnosis not present

## 2023-08-19 NOTE — Progress Notes (Signed)
 Gynecologic Oncology Interval Visit   Referring Provider: Dr. Janit & Dr Babara  Chief Complaint: Endometrial adenocarcinoma, stage IVB  Subjective:  Petrona L Sipe is a 48 y.o. female who is seen in consultation from Dr. Janit for irregular bleeding for many years, diagnosed with Stage IVb endometrioid endometrial cancer currently s/p 6 cycles of neoadjuvant carboplatin  with paclitaxel  and keytruda  with single agent keytruda  and TLH BSO at [facility] with Dr Mancil on 06/10/22, restaging PET June 2024 consistent with response, continues maintenance pembrolizumab , who returns to clinic for pelvic exam.   She was last seen by Dr. Melanee for cycle 12 of palliative Keytruda  on Jul 22 2023. Patient does have complaints today -she complains specifically of left-sided abdominal pain and also left back pain.  She also has some symptoms of neuropathy and leg swelling.  She was recently treated for a urinary tract infection.  She has been using the nystatin  around the umbilical area and the redness has resolved.  04/30/2023 MRI Lumbar Spine IMPRESSION: 1. Pathologic compression fracture at L2 with loss of height centrally of 40-50%, not progressive since the CT scan of December. Some persistent increased T2 signal in the central portion of the vertebral body. Cannot determine if this represents successfully treated tumor or if there is any viable residual tumor. There does not appear to be any progression or any extraosseous extension however. 2. No second lesion visible elsewhere in the region. 3. Mild degenerative disc disease at L1-2, L2-3 and L3-4 without compressive stenosis. 4. Moderate bulging of the disc at L4-5 with mild facet osteoarthritis. No compressive stenosis. 5. Mild facet osteoarthritis at L5-S1 without stenosis.  Gynecologic Oncology History:  Patient presented to her OB/GYN for irregular bleeding for several months.  She was thought to have an IUD in place and her bleeding was attempted to be  controlled with progesterone agents.  Medical history significant for PE in [facility] in 02/2015  12/25/21- CT C/A/P-  for left lower quadrant abdominal pain showed enlarged uterus with thickened endometrium, heterogeneous and irregular extending to the fundal myometrium.  Retroperitoneal and retrocrural adenopathy.  Faint lucent lesion in left L2 vertebrae suspicious for metastasis.  Posterior right lower lobe subpleural nodule measuring 10 mm with surrounding groundglass opacity; suspicious for metastasis though was present on 08/2015 CT but measured 5 mm.   Pelvic ultrasound- Uterus-19 x 13.3 x 15 cm = volume 1974 mls.  Distinct endometrium not visible.  Heterogeneous area which may be surrounded by myometrium may be as much is 7 cm in greatest thickness. Endometrium-thickness, perhaps cystic is 75 mm.  Markedly heterogeneous with signs of internal flow. Right ovary-not visualized Left ovary-5.8 x 3.1 x 4.2 cm.  Simple dominant follicle measuring up to 3.5 cm accounts for much of the left ovarian dimension.  Pulse Doppler evaluation shows low resistance arterial and venous flow within the left ovary Other-small free fluid in the pelvis.  Endometrial biopsy- -endometrioid adenocarcinoma, FIGO grade 2  She was started on norethindrone  by Dr. Janit.   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 She was taking oral contraceptives at that time with increased dosing d/t heavy menstrual periods. Additionally, hx of iron  deficiency and has taken oral iron . Given extent of disease and VTE neoaduvant therapy was recommended.   Treatment Summary:  01/21/22- D1C1- carboplatin  02/12/22- D1C2 carbo-paclitaxel -pembrolizumab  03/05/22- D1C3 carbo-paclitaxel -pembrolizumab   03/17/22- CT Chest Abdomen Pelvis W Contrast-  IMPRESSION: 1. Persistent heterogeneous irregularity of the endometrium with uterine enlargement compatible with known endometrial  malignancy.  2. New heterogeneity of the 2.8  cm left ovarian lesion, nonspecific but possibly reflecting metastatic disease. Consider further evaluation with pelvic ultrasound. 3. Overall decreased retrocrural and retroperitoneal adenopathy. However, there is interval increase in size and adjacent infiltrative/desmoplastic stranding adjacent to a centrally necrotic left periaortic lymph node/lymph node conglomerate. 4. Continued decrease in size of the posterior right lower lobe pulmonary nodule. 5. New pathologic compression deformity of the L2 vertebral body. 6. Diffuse hepatic steatosis. 7. Mild diffuse bronchial wall thickening with mosaic attenuation of the lungs, suggestive of small airways disease.  01/21/22 D1C1 Carbo-paclitaxel -keytruda  02/12/22 D1C2 Carbo-paclitaxel -keytruda  03/05/22 D1C3 Carbo-paclitaxel -keytruda  03/26/22 D1C4 Carbo-paclitaxel -keytruda  04/16/22 D1C5 Carbo-paclitaxel -keytruda  05/07/2022-  D1C6 carbo-paclitaxel -pembrolizumab . 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 Zometa  4 mg added.  05/19/2022  PET - enlarged uterus is hypermetabolic endometrial thickening consistent with known endometrial cancer. Improving left periaortic nodal metastases. Addtiional small cervical, axillary, and inguinal nodes reflective of nodal metastases.   05/28/22-  D1C7 - pembrolizumab  06/10/22-  TLH BSO at [facility] with Dr. Mancil.  Pathology: A.  Uterus, cervix, bilateral fallopian tubes and ovaries, hysterectomy, bilateral salpingo-oophorectomy: Residual endometrioid adenocarcinoma of the endometrium (0.6 cm), FIGO grade 2 of 3, with marked treatment effect, invading 18 mm in a 34 mm thick myometrium. 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 Cervix: Negative for malignancy. Serosa: Negative malignancy. Right and left ovaries: Negative for malignancy. Right and left fallopian tubes: Negative malignancy. Microsatellite instability and mismatch repair protein immunohistochemistry: Not performed (obtained on prior  specimen). P53 immunohistochemistry: Not performed A: Pelvic Washings: negative. No evidence of malignancy.   She continues on maintenance pembrolizumab    09/09/22- PET for restaging. The aortacaval node measures 8 mm and a S.U.V. max of 3.8 on 82/4 versus 7 mm and a S.U.V. max of 3.5 on the prior exam. While this node is very small the SUV is concerning.  Given the findings below it is reasonable to complete pembrolizumab  maintenance and reimage. Overall response to therapy is reassuring. If still positive on subsequent PET consider radiation therapy and then reassess.  01/14/2023 1. Vagina, biopsy, Cuff :  POLYPOID, GRANULATION TISSUE and NEGATIVE FOR MALIGNANCY   02/09/2023 CT C/A/P IMPRESSION: 1. Stable examination without evidence of new or progressive disease within the chest, abdomen, or pelvis. 2. Similar left periaortic amorphous soft tissue density. 3. Unchanged pathologic sclerotic compression deformity of the L2 vertebral body. 4. Unchanged size of the 1.4 cm right inguinal lymph node which was minimally metabolically active on prior examination, nonspecific possibly reactive. 5.  Aortic Atherosclerosis (ICD10-I70.0).   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  BRAF V600E not present. The tumor cells are NEGATIVE for Her2 (1+). Estrogen Receptor:       POSITIVE, 90%, MODERATE STAINING INTENSITY Progesterone Receptor:   POSITIVE, 80%, STRONG STAINING INTENSITY   Problem List: Patient Active Problem List   Diagnosis Date Noted   Hypokalemia 04/01/2022   Endometrial cancer (HCC) 01/15/2022   Iron  deficiency anemia 01/15/2022   Morbid obesity with BMI of 60.0-69.9, adult (HCC) 04/18/2015   Abnormal uterine bleeding 04/18/2015   Anemia 04/18/2015   Pulmonary embolism (HCC) 03/11/2015    Past Medical History: Past Medical History:  Diagnosis Date   Anemia    Endometrial cancer (HCC)    Hypertension    Menorrhagia    Pulmonary  embolism (HCC)     Past Surgical History: Past Surgical History:  Procedure Laterality Date   ABDOMINAL HYSTERECTOMY Bilateral 06/10/2022   at [facility]  CESAREAN SECTION  02/25/1992   COLONOSCOPY WITH PROPOFOL  N/A 08/06/2021   Procedure: COLONOSCOPY WITH PROPOFOL ;  Surgeon: Therisa Bi, MD;  Location: [facility] ENDOSCOPY;  Service: Gastroenterology;  Laterality: N/A;   ESOPHAGOGASTRODUODENOSCOPY (EGD) WITH PROPOFOL  N/A 02/09/2020   Procedure: ESOPHAGOGASTRODUODENOSCOPY (EGD) WITH PROPOFOL ;  Surgeon: Therisa Bi, MD;  Location: [facility] ENDOSCOPY;  Service: Gastroenterology;  Laterality: N/A;   IR IMAGING GUIDED PORT INSERTION  01/17/2022   IR RADIOLOGIST EVAL & MGMT  04/28/2023   IR RADIOLOGIST EVAL & MGMT  05/05/2023    Past Gynecologic History:  Menarche: age 37, last 5 days History of OCP/HRT use: yes Last pap: 03/14/21- ASCUS, HPV negative History of STDs: The patient denies history of sexually transmitted disease. Sexually active: yes; not currently  OB History:  OB History  Gravida Para Term Preterm AB Living  3 3 3   3   SAB IAB Ectopic Multiple Live Births      3    # Outcome Date GA Lbr Len/2nd Weight Sex Type Anes PTL Lv  3 Term     M CS-LTranv   LIV  2 Term     M Vag-Spont   LIV  1 Term     F Vag-Spont   LIV   Family History: Family History  Problem Relation Age of Onset   Hypertension Mother    Stroke Mother    Heart attack Mother    Cancer Father    Hypertension Father    Cancer Sister    Cancer Sister    Cancer Brother    Hypertension Other    Social History: Social History   Socioeconomic History   Marital status: Media planner    Spouse name: Not on file   Number of children: Not on file   Years of education: Not on file   Highest education level: Not on file  Occupational History   Not on file  Tobacco Use   Smoking status: Never   Smokeless tobacco: Never  Vaping Use   Vaping status: Never Used  Substance and Sexual Activity   Alcohol use: No    Drug use: No   Sexual activity: Not Currently    Birth control/protection: I.U.D.    Comment: patient states MD said did not see in CT today 01/17/2022  Other Topics Concern   Not on file  Social History Narrative   Lives with [facility], partner, and no pets.   Social Drivers of Corporate investment banker Strain: Not on file  Food Insecurity: Not on file  Transportation Needs: Not on file  Physical Activity: Not on file  Stress: Not on file  Social Connections: Not on file  Intimate Partner Violence: Not on file   Allergies: Allergies  Allergen Reactions   Bee Pollen Nausea And Vomiting    Itchy, swelling, watery eyes, runny nose.   Pollen Extract Nausea And Vomiting    Itchy, swelling, watery eyes, runny nose.   Current Medications: Current Outpatient Medications  Medication Sig Dispense Refill   diclofenac  Sodium (VOLTAREN ) 1 % GEL Apply 4 g topically 4 (four) times daily. 50 g 0   ELIQUIS  2.5 MG TABS tablet TAKE 1 TABLET BY MOUTH TWICE A DAY 60 tablet 3   gabapentin  (NEURONTIN ) 300 MG capsule TAKE 1 CAPSULE (300 MG TOTAL) BY MOUTH 3 (THREE) TIMES DAILY. START WITH ONCE A DAY AND GRADUALLY INCREASE TO TID OVER 1 WEEK 90 capsule 0   KLOR-CON  M20 20 MEQ tablet TAKE 1 TABLET (20 MEQ TOTAL)  BY MOUTH 2 (TWO) TIMES DAILY. DISSOLVE TABLETS 60 tablet 2   levonorgestrel  (MIRENA ) 20 MCG/24HR IUD 1 Intra Uterine Device (1 each total) by Intrauterine route once. 1 each 0   lidocaine -prilocaine  (EMLA ) cream Apply to port site as directed 30 g 3   LORazepam  (ATIVAN ) 0.5 MG tablet Take 1 tablet (0.5 mg total) by mouth every 8 (eight) hours. 30 tablet 0   magnesium  hydroxide (MILK OF MAGNESIA) 400 MG/5ML suspension Take 15 mLs by mouth daily as needed for moderate constipation. Take if no bowel movement for 2 days.     nystatin  (MYCOSTATIN /NYSTOP ) powder Apply 1 Application topically 3 (three) times daily. 30 g 3   oxyCODONE  (OXY IR/ROXICODONE ) 5 MG immediate release tablet Take 1 tablet (5 mg  total) by mouth every 4 (four) hours as needed for severe pain (pain score 7-10). 60 tablet 0   polyethylene glycol (MIRALAX ) 17 g packet Take 17 g by mouth daily. To prevent constipation 30 each 3   benzonatate  (TESSALON ) 100 MG capsule Take 1 capsule (100 mg total) by mouth 3 (three) times daily as needed for cough. (Patient not taking: Reported on 08/19/2023) 20 capsule 0   fluconazole  (DIFLUCAN ) 150 MG tablet Take 1 tablet (150 mg total) by mouth daily. Take 1 tablet (150 mg) by mouth. Three days later, take second tablet. (Patient not taking: Reported on 08/19/2023) 2 tablet 0   methocarbamol  (ROBAXIN ) 500 MG tablet Take 1 tablet (500 mg total) by mouth 2 (two) times daily as needed for muscle spasms. (Patient not taking: Reported on 08/19/2023) 30 tablet 0   pantoprazole  (PROTONIX ) 40 MG tablet Take 1 tablet (40 mg total) by mouth daily. (Patient not taking: Reported on 08/19/2023) 30 tablet 2   prochlorperazine  (COMPAZINE ) 10 MG tablet Take 1 tablet (10 mg total) by mouth every 6 (six) hours as needed for nausea or vomiting. (Patient not taking: Reported on 08/19/2023) 30 tablet 1   senna (SENOKOT) 8.6 MG TABS tablet Take 1-2 tablets (8.6-17.2 mg total) by mouth daily. To prevent constipation (Patient not taking: Reported on 08/19/2023) 120 tablet 0   No current facility-administered medications for this visit.   Review of Systems General: fatigue o/w no complaints  HEENT: no complaints  Lungs: no complaints  Cardiac: no complaints  GI: abdominal pain specifically after bowel movements o/w no GI complaints  GU: no complaints  Musculoskeletal: back pain o/w no complaints  Extremities: leg swelling   Skin: no complaints  Neuro: leg/feet numbness o/w no complaints  Endocrine: no complaints  Psych: no complaints          Objective:  Physical Examination:  Vitals:   08/19/23 0958  BP: 132/80  Pulse: 87  Resp: 17  Temp: (!) 96.9 F (36.1 C)  SpO2: 96%     Body mass index is 59.13  kg/m.  GENERAL: Patient is a well appearing female in no acute distress HEENT:  Atraumatic and normocephalic. PERRL, neck supple. NODES:  No cervical, supraclavicular, axillary, or inguinal lymphadenopathy palpated.  LUNGS:  Normal respiratory effort ABDOMEN:  Soft, nontender. Nondistended. No masses/ascites/hernia/or hepatomegaly.  EXTREMITIES:  No peripheral edema.   SKIN: The umbilical redness has resolved completely.  Clear with no obvious rashes or skin changes o/t mild redness under the pannus. NEURO:  Nonfocal. Well oriented.  Appropriate affect.  Pelvic: EGBUS: no lesions Cervix: surgically absent Vagina: no lesions, no discharge or bleeding Uterus: surgically absent BME: no palpable masses Rectovaginal: deferred      IMAGING:  As per HPI   Assessment:  Kerah L Kreiter is a 48 y.o. female with PMB, anemia and large uterus diagnosed with figo grade 2 endometrial cancer on endometrial biopsy 11/23.  Stage IVB based on suspicious lung lesion, L2 vertebral lesion, significantly enlarged left aortic adenopathy consistent with metastatic disease on imaging. Improved disease control s/p 6 cycles of chemotherapy with carbo/taxol  and keytruda .  PET/CT 2/34 showed enlarged uterus with hypermetabolic endometrial thickening and no other PET avid lesions.  In view of this, underwent TLH BSO 06/06/22 for disease control with excellent recovery.  Pathology showed grade 2 endometrioid cancer with 50% invasion, cervix, adnexa and washings negative.  PET scan 7/24 equivoval activity in retroperitoneal nodes.  Continues on Keytruda  maintenance q6wks.     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  BRAF V600E not present. Her2 (1+). Estrogen Receptor:       POSITIVE, 90%, MODERATE STAINING INTENSITY Progesterone Receptor:   POSITIVE, 80%, STRONG STAINING INTENSITY  History of PE currently on Eliquis   Abdominal and back pain, uncertain etiology   Neuropathy  type symptoms most likely due to prior neuropathy  Medical co-morbidities complicating care: HTN, Body mass index is 59.13 kg/m., prior surgery, PE while taking OCs. Plan:   Problem List Items Addressed This Visit       Genitourinary   Endometrial cancer (HCC) - Primary   Relevant Orders   CT CHEST ABDOMEN PELVIS W CONTRAST   Other Visit Diagnoses       Abdominal pain, left lower quadrant         Acute left-sided low back pain without sciatica            Recommend continuing pembrolizumab  maintenance with Dr Melanee her Medical Oncologist.  Repeat imaging ordered today.  If negative continue follow up in 3 months. Radiation therapy would be an option for additional treatment if there is concern for nodal disease in left PA node or other areas.  Would not necessarily radiate entire pelvic and aortic nodal area since she originally had adenopathy above that and it responded to systemic therapy.    If she develops progressive disease it would be an option to continue Pembrolizumab  and add Lenvatanib.  In view of the endometrioid histology and positive ER/PR expression she could also be a candidate for a hormonal regimen such as alternating Megace and Tamoxifen or Everolimus/Letrozole.  Or consider clinical trials.   Continue use nystatin  on the umbilical area and pannus as needed.   She enrolled on the [employer] consortium study.   The patient's diagnosis, an outline of the further diagnostic and laboratory studies which will be required, the recommendation for surgery, and alternatives were discussed with her and her accompanying family members.  All questions were answered to their satisfaction.  Follow-up in 3 months for continued close surveillance if scan is negative.  Keylin Podolsky Isidor Constable, MD

## 2023-08-20 ENCOUNTER — Other Ambulatory Visit: Payer: Self-pay

## 2023-08-26 ENCOUNTER — Ambulatory Visit: Payer: Self-pay | Admitting: Obstetrics and Gynecology

## 2023-08-26 ENCOUNTER — Ambulatory Visit
Admission: RE | Admit: 2023-08-26 | Discharge: 2023-08-26 | Disposition: A | Discharge status: Home / Self Care | Source: Ambulatory Visit | Attending: Obstetrics and Gynecology | Admitting: Obstetrics and Gynecology

## 2023-08-26 DIAGNOSIS — R911 Solitary pulmonary nodule: Secondary | ICD-10-CM | POA: Diagnosis not present

## 2023-08-26 DIAGNOSIS — Z90710 Acquired absence of both cervix and uterus: Secondary | ICD-10-CM | POA: Diagnosis not present

## 2023-08-26 DIAGNOSIS — C541 Malignant neoplasm of endometrium: Secondary | ICD-10-CM | POA: Diagnosis not present

## 2023-08-26 MED ORDER — IOHEXOL 300 MG/ML  SOLN
100.0000 mL | Freq: Once | INTRAMUSCULAR | Status: AC | PRN
  Administered 2023-08-26: 100 mL via INTRAVENOUS

## 2023-08-30 ENCOUNTER — Other Ambulatory Visit: Payer: Self-pay | Admitting: Oncology

## 2023-08-31 ENCOUNTER — Encounter: Payer: Self-pay | Admitting: Oncology

## 2023-09-02 ENCOUNTER — Inpatient Hospital Stay: Attending: Obstetrics and Gynecology

## 2023-09-02 ENCOUNTER — Inpatient Hospital Stay

## 2023-09-02 ENCOUNTER — Encounter: Payer: Self-pay | Admitting: Oncology

## 2023-09-02 ENCOUNTER — Inpatient Hospital Stay (HOSPITAL_BASED_OUTPATIENT_CLINIC_OR_DEPARTMENT_OTHER): Admitting: Oncology

## 2023-09-02 VITALS — BP 120/84 | HR 84 | Temp 97.8°F | Resp 18 | Ht 65.0 in | Wt 358.0 lb

## 2023-09-02 VITALS — BP 145/82 | HR 78 | Resp 18

## 2023-09-02 DIAGNOSIS — Z79899 Other long term (current) drug therapy: Secondary | ICD-10-CM | POA: Insufficient documentation

## 2023-09-02 DIAGNOSIS — D649 Anemia, unspecified: Secondary | ICD-10-CM | POA: Insufficient documentation

## 2023-09-02 DIAGNOSIS — Z86718 Personal history of other venous thrombosis and embolism: Secondary | ICD-10-CM | POA: Diagnosis not present

## 2023-09-02 DIAGNOSIS — C541 Malignant neoplasm of endometrium: Secondary | ICD-10-CM

## 2023-09-02 DIAGNOSIS — Z5112 Encounter for antineoplastic immunotherapy: Secondary | ICD-10-CM | POA: Diagnosis not present

## 2023-09-02 DIAGNOSIS — Z90710 Acquired absence of both cervix and uterus: Secondary | ICD-10-CM | POA: Diagnosis not present

## 2023-09-02 DIAGNOSIS — Z791 Long term (current) use of non-steroidal anti-inflammatories (NSAID): Secondary | ICD-10-CM | POA: Insufficient documentation

## 2023-09-02 DIAGNOSIS — Z90722 Acquired absence of ovaries, bilateral: Secondary | ICD-10-CM | POA: Insufficient documentation

## 2023-09-02 DIAGNOSIS — Z7901 Long term (current) use of anticoagulants: Secondary | ICD-10-CM | POA: Insufficient documentation

## 2023-09-02 DIAGNOSIS — R590 Localized enlarged lymph nodes: Secondary | ICD-10-CM | POA: Insufficient documentation

## 2023-09-02 DIAGNOSIS — R918 Other nonspecific abnormal finding of lung field: Secondary | ICD-10-CM | POA: Diagnosis not present

## 2023-09-02 DIAGNOSIS — R161 Splenomegaly, not elsewhere classified: Secondary | ICD-10-CM | POA: Insufficient documentation

## 2023-09-02 DIAGNOSIS — I10 Essential (primary) hypertension: Secondary | ICD-10-CM | POA: Insufficient documentation

## 2023-09-02 DIAGNOSIS — C772 Secondary and unspecified malignant neoplasm of intra-abdominal lymph nodes: Secondary | ICD-10-CM | POA: Insufficient documentation

## 2023-09-02 DIAGNOSIS — Z809 Family history of malignant neoplasm, unspecified: Secondary | ICD-10-CM | POA: Insufficient documentation

## 2023-09-02 LAB — COMPREHENSIVE METABOLIC PANEL WITH GFR
ALT: 15 U/L (ref 0–44)
AST: 22 U/L (ref 15–41)
Albumin: 3.4 g/dL — ABNORMAL LOW (ref 3.5–5.0)
Alkaline Phosphatase: 63 U/L (ref 38–126)
Anion gap: 7 (ref 5–15)
BUN: 12 mg/dL (ref 6–20)
CO2: 24 mmol/L (ref 22–32)
Calcium: 8.9 mg/dL (ref 8.9–10.3)
Chloride: 104 mmol/L (ref 98–111)
Creatinine, Ser: 0.94 mg/dL (ref 0.44–1.00)
GFR, Estimated: >60 mL/min (ref >60)
Glucose, Bld: 99 mg/dL (ref 70–99)
Potassium: 3.6 mmol/L (ref 3.5–5.1)
Sodium: 135 mmol/L (ref 135–145)
Total Bilirubin: 0.7 mg/dL (ref 0.0–1.2)
Total Protein: 7.4 g/dL (ref 6.5–8.1)

## 2023-09-02 LAB — CBC WITH DIFFERENTIAL/PLATELET
Abs Immature Granulocytes: 0.06 K/uL (ref 0.00–0.07)
Basophils Absolute: 0 K/uL (ref 0.0–0.1)
Basophils Relative: 1 %
Eosinophils Absolute: 0.3 K/uL (ref 0.0–0.5)
Eosinophils Relative: 6 %
HCT: 34.2 % — ABNORMAL LOW (ref 36.0–46.0)
Hemoglobin: 12.2 g/dL (ref 12.0–15.0)
Immature Granulocytes: 1 %
Lymphocytes Relative: 27 %
Lymphs Abs: 1.5 K/uL (ref 0.7–4.0)
MCH: 26.8 pg (ref 26.0–34.0)
MCHC: 35.7 g/dL (ref 30.0–36.0)
MCV: 75 fL — ABNORMAL LOW (ref 80.0–100.0)
Monocytes Absolute: 0.4 K/uL (ref 0.1–1.0)
Monocytes Relative: 6 %
Neutro Abs: 3.3 K/uL (ref 1.7–7.7)
Neutrophils Relative %: 59 %
Platelets: 273 K/uL (ref 150–400)
RBC: 4.56 MIL/uL (ref 3.87–5.11)
RDW: 14.9 % (ref 11.5–15.5)
WBC: 5.6 K/uL (ref 4.0–10.5)
nRBC: 0 % (ref 0.0–0.2)

## 2023-09-02 MED ORDER — SODIUM CHLORIDE 0.9 % IV SOLN
400.0000 mg | Freq: Once | INTRAVENOUS | Status: AC
  Administered 2023-09-02: 400 mg via INTRAVENOUS
  Filled 2023-09-02: qty 16

## 2023-09-02 MED ORDER — HEPARIN SOD (PORK) LOCK FLUSH 100 UNIT/ML IV SOLN
500.0000 [IU] | Freq: Once | INTRAVENOUS | Status: AC | PRN
  Administered 2023-09-02: 500 [IU]
  Filled 2023-09-02: qty 5

## 2023-09-02 MED ORDER — SODIUM CHLORIDE 0.9 % IV SOLN
Freq: Once | INTRAVENOUS | Status: AC
  Filled 2023-09-02: qty 250

## 2023-09-02 MED ORDER — DEXAMETHASONE SODIUM PHOSPHATE 10 MG/ML IJ SOLN
10.0000 mg | Freq: Once | INTRAMUSCULAR | Status: AC
  Administered 2023-09-02: 10 mg via INTRAVENOUS
  Filled 2023-09-02: qty 1

## 2023-09-02 NOTE — Patient Instructions (Signed)

## 2023-09-02 NOTE — Progress Notes (Signed)
 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 Janit for irregular menstrual bleeding.  He has had an IUD in place and initially it was attribute it to use of IUD and was tried to be controlled with progesterone agents.  However due to worsening abdominal pain patient underwent a CT abdomen and pelvis with contrast on 12/25/2021 which showed 8 mm   Posterior lung base mass.  Retroperitoneal adenopathy measuring 2.6 cm additional retrocrural adenopathy.  Faint lucent lesion involving L2 vertebral body.  The uterus is anteverted and enlarged.  Endometrium is enlarged heterogeneous and irregular extending to the fundal myometrium.  Findings consistent with endometrial malignancy.  CT chest without contrast showed a 10 mm right lower lobe subpleural nodule.   Patient had endometrial biopsy which was consistent FIGO grade 2 endometrioid endometrial carcinoma.  Patient was seen by GYN oncology and hopes to take her for surgery if she has good response to neoadjuvant chemotherapy.MSI unstable.  BRAF negative.  Loss of major and minor MMR proteins MLH1 and PMS2 less than 5% of tumor expression.  MLH1 hyper methylation present.  This is most likely due to somatic epigenetic modification which can be seen in about 77% of sporadic endometrial cancers.   Patient completed 6 cycles of CarboTaxol Keytruda  chemotherapy and is  currently on maintenance Keytruda .  PET CT scan on 05/19/2022 showed enlarged uterus with hypermetabolic endometrial thickening.  Improving left periaortic nodal metastases.  No definitive bone metastases was noted.  Plan is to proceed with cytoreductive surgery at [facility].  Patient underwent hysterectomy and bilateral salpingo-oophorectomy on 06/10/2022.  Final pathology showed residual endometrioid adenocarcinoma of the endometrium FIGO grade 2 with marked treatment effect invading 18 mm and a 34 mm thick myometrium.  Fallopian tubes and ovaries were negative for malignancy.  Pelvic washings were negative for malignancy.   Patient is currently on maintenance Keytruda  and plan is to do 14 maintenance cycles    Interval history-she has chronic lower abdominal pain which comes and goes.  She is tolerating treatments well otherwise.  She is more concerned about her weight gain as her weight has been steadily going up and presently at 358 pounds.  ECOG PS- 0 Pain scale- 0   Review of systems- Review of Systems  Constitutional:  Negative for chills, fever, malaise/fatigue and weight loss.  HENT:  Negative for congestion, ear discharge and nosebleeds.   Eyes:  Negative for blurred vision.  Respiratory:  Negative for cough, hemoptysis, sputum production, shortness of breath and wheezing.   Cardiovascular:  Negative for chest pain, palpitations, orthopnea and claudication.  Gastrointestinal:  Negative for abdominal pain, blood in stool, constipation, diarrhea, heartburn, melena, nausea and vomiting.  Genitourinary:  Negative for dysuria, flank pain, frequency, hematuria and urgency.  Musculoskeletal:  Negative for back pain, joint pain and myalgias.  Skin:  Negative for rash.  Neurological:  Negative for dizziness, tingling, focal weakness, seizures, weakness and headaches.  Endo/Heme/Allergies:  Does not  bruise/bleed easily.  Psychiatric/Behavioral:  Negative for depression and suicidal ideas. The  patient does not have insomnia.       Allergies  Allergen Reactions   Bee Pollen Nausea And Vomiting    Itchy, swelling, watery eyes, runny nose.   Pollen Extract Nausea And Vomiting    Itchy, swelling, watery eyes, runny nose.     Past Medical History:  Diagnosis Date   Anemia    Endometrial cancer (HCC)    Hypertension    Menorrhagia    Pulmonary embolism [facility])      Past Surgical History:  Procedure Laterality Date   ABDOMINAL HYSTERECTOMY Bilateral 06/10/2022   at [facility]   CESAREAN SECTION  02/25/1992   COLONOSCOPY WITH PROPOFOL  N/A 08/06/2021   Procedure: COLONOSCOPY WITH PROPOFOL ;  Surgeon: Therisa Bi, MD;  Location: [facility] ENDOSCOPY;  Service: Gastroenterology;  Laterality: N/A;   ESOPHAGOGASTRODUODENOSCOPY (EGD) WITH PROPOFOL  N/A 02/09/2020   Procedure: ESOPHAGOGASTRODUODENOSCOPY (EGD) WITH PROPOFOL ;  Surgeon: Therisa Bi, MD;  Location: [facility] ENDOSCOPY;  Service: Gastroenterology;  Laterality: N/A;   IR IMAGING GUIDED PORT INSERTION  01/17/2022   IR RADIOLOGIST EVAL & MGMT  04/28/2023   IR RADIOLOGIST EVAL & MGMT  05/05/2023    Social History   Socioeconomic History   Marital status: Media planner    Spouse name: Not on file   Number of children: Not on file   Years of education: Not on file   Highest education level: Not on file  Occupational History   Not on file  Tobacco Use   Smoking status: Never   Smokeless tobacco: Never  Vaping Use   Vaping status: Never Used  Substance and Sexual Activity   Alcohol use: No   Drug use: No   Sexual activity: Not Currently    Birth control/protection: I.U.D.    Comment: patient states MD said did not see in CT today 01/17/2022  Other Topics Concern   Not on file  Social History Narrative   Lives with [facility], partner, and no pets.   Social Drivers of Corporate investment banker Strain: Not on file  Food Insecurity: Not on file  Transportation Needs: Not on file  Physical Activity: Not on file  Stress: Not  on file  Social Connections: Not on file  Intimate Partner Violence: Not on file    Family History  Problem Relation Age of Onset   Hypertension Mother    Stroke Mother    Heart attack Mother    Cancer Father    Hypertension Father    Cancer Sister    Cancer Sister    Cancer Brother    Hypertension Other      Current Outpatient Medications:    benzonatate  (TESSALON ) 100 MG capsule, Take 1 capsule (100 mg total) by mouth 3 (three) times daily as needed for cough. (Patient not taking: Reported on 08/19/2023), Disp: 20 capsule, Rfl: 0   diclofenac  Sodium (VOLTAREN ) 1 % GEL, Apply 4 g topically 4 (four) times daily., Disp: 50 g, Rfl: 0   ELIQUIS  2.5 MG TABS tablet, TAKE 1 TABLET BY MOUTH TWICE A DAY, Disp: 60 tablet, Rfl: 3   fluconazole  (DIFLUCAN ) 150 MG tablet, Take 1 tablet (150 mg total) by mouth daily. Take 1 tablet (150 mg) by mouth. Three days later, take second tablet. (Patient not taking: Reported on 08/19/2023), Disp: 2 tablet, Rfl: 0   gabapentin  (NEURONTIN ) 300 MG capsule, TAKE 1 CAPSULE (300 MG TOTAL) BY MOUTH 3 (THREE) TIMES DAILY. START WITH ONCE  A DAY AND GRADUALLY INCREASE TO TID OVER 1 WEEK, Disp: 90 capsule, Rfl: 0   KLOR-CON  M20 20 MEQ tablet, TAKE 1 TABLET (20 MEQ TOTAL) BY MOUTH 2 (TWO) TIMES DAILY. DISSOLVE TABLETS, Disp: 60 tablet, Rfl: 2   levonorgestrel  (MIRENA ) 20 MCG/24HR IUD, 1 Intra Uterine Device (1 each total) by Intrauterine route once., Disp: 1 each, Rfl: 0   lidocaine -prilocaine  (EMLA ) cream, Apply to port site as directed, Disp: 30 g, Rfl: 3   LORazepam  (ATIVAN ) 0.5 MG tablet, Take 1 tablet (0.5 mg total) by mouth every 8 (eight) hours., Disp: 30 tablet, Rfl: 0   magnesium  hydroxide (MILK OF MAGNESIA) 400 MG/5ML suspension, Take 15 mLs by mouth daily as needed for moderate constipation. Take if no bowel movement for 2 days., Disp: , Rfl:    methocarbamol  (ROBAXIN ) 500 MG tablet, Take 1 tablet (500 mg total) by mouth 2 (two) times daily as needed for muscle  spasms. (Patient not taking: Reported on 08/19/2023), Disp: 30 tablet, Rfl: 0   nystatin  (MYCOSTATIN /NYSTOP ) powder, Apply 1 Application topically 3 (three) times daily., Disp: 30 g, Rfl: 3   oxyCODONE  (OXY IR/ROXICODONE ) 5 MG immediate release tablet, Take 1 tablet (5 mg total) by mouth every 4 (four) hours as needed for severe pain (pain score 7-10)., Disp: 60 tablet, Rfl: 0   pantoprazole  (PROTONIX ) 40 MG tablet, Take 1 tablet (40 mg total) by mouth daily. (Patient not taking: Reported on 08/19/2023), Disp: 30 tablet, Rfl: 2   polyethylene glycol (MIRALAX ) 17 g packet, Take 17 g by mouth daily. To prevent constipation, Disp: 30 each, Rfl: 3   prochlorperazine  (COMPAZINE ) 10 MG tablet, Take 1 tablet (10 mg total) by mouth every 6 (six) hours as needed for nausea or vomiting. (Patient not taking: Reported on 08/19/2023), Disp: 30 tablet, Rfl: 1   senna (SENOKOT) 8.6 MG TABS tablet, Take 1-2 tablets (8.6-17.2 mg total) by mouth daily. To prevent constipation (Patient not taking: Reported on 08/19/2023), Disp: 120 tablet, Rfl: 0  Physical exam:  Vitals:   09/02/23 0939  BP: 120/84  Pulse: 84  Resp: 18  Temp: 97.8 F (36.6 C)  TempSrc: Tympanic  SpO2: 99%  Weight: (!) 358 lb (162.4 kg)  Height: 5' 5 (1.651 m)   Physical Exam Cardiovascular:     Rate and Rhythm: Normal rate and regular rhythm.     Heart sounds: Normal heart sounds.  Pulmonary:     Effort: Pulmonary effort is normal.     Breath sounds: Normal breath sounds.  Abdominal:     General: Bowel sounds are normal.     Palpations: Abdomen is soft.  Skin:    General: Skin is warm and dry.  Neurological:     Mental Status: She is alert and oriented to person, place, and time.      I have personally reviewed labs listed below:    Latest Ref Rng & Units 07/22/2023    8:54 AM  CMP  Glucose 70 - 99 mg/dL 98   BUN 6 - 20 mg/dL 15   Creatinine 9.55 - 1.00 mg/dL 9.16   Sodium 864 - 854 mmol/L 135   Potassium 3.5 - 5.1 mmol/L 3.7    Chloride 98 - 111 mmol/L 103   CO2 22 - 32 mmol/L 24   Calcium 8.9 - 10.3 mg/dL 8.9   Total Protein 6.5 - 8.1 g/dL 7.7   Total Bilirubin 0.0 - 1.2 mg/dL 0.7   Alkaline Phos 38 - 126 U/L 73  AST 15 - 41 U/L 19   ALT 0 - 44 U/L 11       Latest Ref Rng & Units 07/22/2023    8:54 AM  CBC  WBC 4.0 - 10.5 K/uL 7.1   Hemoglobin 12.0 - 15.0 g/dL 87.1   Hematocrit 63.9 - 46.0 % 36.5   Platelets 150 - 400 K/uL 317    I have personally reviewed Radiology images listed below: No images are attached to the encounter.  CT CHEST ABDOMEN PELVIS W CONTRAST Result Date: 08/26/2023 CLINICAL DATA:  The endometrial cancer, monitor, high risk. * Tracking Code: BO * EXAM: CT CHEST, ABDOMEN, AND PELVIS WITH CONTRAST TECHNIQUE: Multidetector CT imaging of the chest, abdomen and pelvis was performed following the standard protocol during bolus administration of intravenous contrast. RADIATION DOSE REDUCTION: This exam was performed according to the departmental dose-optimization program which includes automated exposure control, adjustment of the mA and/or kV according to patient size and/or use of iterative reconstruction technique. CONTRAST:  OMNIPAQUE  IOHEXOL  300 MG/ML  SOLN COMPARISON:  Multiple priors including CT February 09, 2023 FINDINGS: CT CHEST FINDINGS Cardiovascular: Right chest Port-A-Cath with tip near the superior cavoatrial junction. Normal size heart. No significant pericardial effusion/thickening. Mediastinum/Nodes: No suspicious thyroid  nodule. No pathologically enlarged mediastinal, hilar or axillary lymph nodes. Lungs/Pleura: New 4 mm right upper lobe pulmonary nodule on image 53/4. Calcified right upper lobe granuloma. Scattered atelectasis/scarring. Musculoskeletal: No aggressive lytic or blastic lesion of bone. CT ABDOMEN PELVIS FINDINGS Hepatobiliary: No suspicious hepatic lesion. Gallbladder is unremarkable. No biliary ductal dilation. Pancreas: No pancreatic ductal dilation or  evidence of acute inflammation. Spleen: Snow splenomegaly. Adrenals/Urinary Tract: No suspicious adrenal nodule/mass. No hydronephrosis. Kidneys demonstrate symmetric enhancement. Urinary bladder is unremarkable for degree of distension. Stomach/Bowel: Stomach is unremarkable for degree of distension. No evidence of bowel obstruction. Vascular/Lymphatic: Normal caliber abdominal aorta. Prominent portacaval lymph nodes are similar prior and favored reactive. Left periaortic amorphous soft tissue density measures 2.0 x 1.1 cm on image 70/2 previously 2.2 x 1.0. Decreased size of the right inguinal lymph node which was minimally metabolic on prior PET-CT now measuring 7 mm in short axis previously 14 mm. No new pathologically enlarged or enlarging abdominal or pelviclymph nodes identified. Reproductive: Uterus is surgically absent. No new abnormal soft tissue nodularity along the vaginal cuff. No suspicious adnexal mass. Other: No significant abdominopelvic free fluid. No discrete peritoneal or omental nodularity. Musculoskeletal: Unchanged sclerotic pathologic compression deformity of the L2 vertebral body. The age no new aggressive lytic or blastic lesion of bone. IMPRESSION: 1. New 4 mm right upper lobe pulmonary nodule, nonspecific but favored to reflect an infectious or inflammatory etiology. Consider attention on short-term interval follow-up chest CT 2. Stable left periaortic amorphous soft tissue density. 3. Decreased size of the right inguinal lymph node which was minimally metabolic on prior PET-CT. 4. Unchanged sclerotic pathologic compression deformity of the L2 vertebral body. 5. No evidence of new or progressive disease in the abdomen or pelvis. Electronically Signed   By: Reyes Holder M.D.   On: 08/26/2023 14:51     Assessment and plan- Patient is a 48 y.o. female with history of stage IVb endometrioid endometrial carcinoma here for on treatment assessment prior to cycle 13 of palliative  Keytruda   Patient initially received Keytruda  every 3 weeks and subsequently we switched her to every 6 weeks treatment.  Counts okay to proceed with cycle 13 of palliative Keytruda  today and I will see her back in 6 weeks  for cycle 14  I have reviewed CT chest abdomen pelvis with contrast images independently and discussed findings with the patient.  Patient had a CT chest abdomen and pelvis with contrast on 08/26/2023 which showed a new 4 mm right upper lobe lung nodule which was nonspecific and short-term CT scan was recommended.  Right inguinal lymph node had decreased in size measuring 7 mm as compared to 17 mm prior.  Compression deformity L2 unchanged.  Left periaortic Morphis soft tissue density was stable at 2 cm.  I have also discussed her case with Dr. Elby as well.  Given the presence of a new nonspecific right upper lobe lung nodule as well as periaortic soft tissue density of 2 cm we will plan to get a PET scan in about 8 weeks time.  If none of these areas are hypermetabolic we can consider stopping Keytruda  after 14 cycles.  Patient verbalized understanding of the plan   Visit Diagnosis 1. Endometrial cancer (HCC)   2. Encounter for antineoplastic immunotherapy      Dr. Annah Skene, MD, MPH [facility] at [facility] [phone] 09/02/2023 7:51 AM

## 2023-09-04 ENCOUNTER — Encounter: Payer: Self-pay | Admitting: *Deleted

## 2023-09-28 ENCOUNTER — Telehealth: Payer: Self-pay | Admitting: *Deleted

## 2023-09-28 ENCOUNTER — Other Ambulatory Visit: Payer: Self-pay | Admitting: *Deleted

## 2023-09-28 MED ORDER — PREGABALIN 75 MG PO CAPS: 75.0000 mg | ORAL_CAPSULE | Freq: Two times a day (BID) | ORAL | 0 refills | Status: DC

## 2023-09-28 NOTE — Telephone Encounter (Signed)
 Patient called in and said that she is out of gabapentin  and she wanted to know from Dr. Daun she stay on gabapentin  or go to Lyrica .  Dr. Melanee says she can go on Lyrica .  I called the patient that she is on the Lyrica 

## 2023-10-06 ENCOUNTER — Telehealth: Payer: Self-pay | Admitting: *Deleted

## 2023-10-06 DIAGNOSIS — R300 Dysuria: Secondary | ICD-10-CM

## 2023-10-06 NOTE — Telephone Encounter (Signed)
 Lyrica  is not likely to cause uti. Is lyrica  working better for her or does she want to go back to gabapentin ? We can check UA for her

## 2023-10-06 NOTE — Telephone Encounter (Signed)
 The patient says she started Lyrica  on Wednesday of last week and for about 2 or 3 days she had some light bleeding from.  Now when she goes to the bathroom frequently and right at the end of the  last urine has a pain with it.  She feels like she is getting the UTI again.  Patient says that every time she gets on another medicine that she is had not been on or had not been on a long time it seems like she gets a UTI.

## 2023-10-07 ENCOUNTER — Telehealth: Payer: Self-pay | Admitting: *Deleted

## 2023-10-07 NOTE — Telephone Encounter (Signed)
 The case manager wants to know the last time the patient came in to see the doctor and to send that note for her.  I have to send it down to the medical records and they will send it to her. 09/02/2023 was the last 1 she has had.

## 2023-10-07 NOTE — Telephone Encounter (Signed)
 The case manager

## 2023-10-07 NOTE — Telephone Encounter (Signed)
 Duplicate/error

## 2023-10-08 ENCOUNTER — Encounter: Payer: Self-pay | Admitting: Oncology

## 2023-10-08 ENCOUNTER — Inpatient Hospital Stay: Attending: Obstetrics and Gynecology

## 2023-10-08 DIAGNOSIS — Z809 Family history of malignant neoplasm, unspecified: Secondary | ICD-10-CM | POA: Insufficient documentation

## 2023-10-08 DIAGNOSIS — R918 Other nonspecific abnormal finding of lung field: Secondary | ICD-10-CM | POA: Diagnosis not present

## 2023-10-08 DIAGNOSIS — T451X5A Adverse effect of antineoplastic and immunosuppressive drugs, initial encounter: Secondary | ICD-10-CM | POA: Diagnosis not present

## 2023-10-08 DIAGNOSIS — Z90722 Acquired absence of ovaries, bilateral: Secondary | ICD-10-CM | POA: Insufficient documentation

## 2023-10-08 DIAGNOSIS — I10 Essential (primary) hypertension: Secondary | ICD-10-CM | POA: Insufficient documentation

## 2023-10-08 DIAGNOSIS — C541 Malignant neoplasm of endometrium: Secondary | ICD-10-CM | POA: Diagnosis not present

## 2023-10-08 DIAGNOSIS — D649 Anemia, unspecified: Secondary | ICD-10-CM | POA: Insufficient documentation

## 2023-10-08 DIAGNOSIS — N920 Excessive and frequent menstruation with regular cycle: Secondary | ICD-10-CM | POA: Insufficient documentation

## 2023-10-08 DIAGNOSIS — Z791 Long term (current) use of non-steroidal anti-inflammatories (NSAID): Secondary | ICD-10-CM | POA: Diagnosis not present

## 2023-10-08 DIAGNOSIS — Z86711 Personal history of pulmonary embolism: Secondary | ICD-10-CM | POA: Diagnosis not present

## 2023-10-08 DIAGNOSIS — Z90710 Acquired absence of both cervix and uterus: Secondary | ICD-10-CM | POA: Insufficient documentation

## 2023-10-08 DIAGNOSIS — G620 Drug-induced polyneuropathy: Secondary | ICD-10-CM | POA: Diagnosis not present

## 2023-10-08 DIAGNOSIS — Z5112 Encounter for antineoplastic immunotherapy: Secondary | ICD-10-CM | POA: Insufficient documentation

## 2023-10-08 DIAGNOSIS — R590 Localized enlarged lymph nodes: Secondary | ICD-10-CM | POA: Diagnosis not present

## 2023-10-08 DIAGNOSIS — Z79899 Other long term (current) drug therapy: Secondary | ICD-10-CM | POA: Insufficient documentation

## 2023-10-08 DIAGNOSIS — R300 Dysuria: Secondary | ICD-10-CM

## 2023-10-08 DIAGNOSIS — Z7901 Long term (current) use of anticoagulants: Secondary | ICD-10-CM | POA: Diagnosis not present

## 2023-10-08 LAB — URINALYSIS, COMPLETE (UACMP) WITH MICROSCOPIC
Bilirubin Urine: NEGATIVE
Glucose, UA: NEGATIVE mg/dL
Ketones, ur: NEGATIVE mg/dL
Nitrite: NEGATIVE
Protein, ur: 100 mg/dL — AB
Specific Gravity, Urine: 1.015 (ref 1.005–1.030)
WBC, UA: >50 WBC/hpf (ref 0–5)
pH: 6 (ref 5.0–8.0)

## 2023-10-08 NOTE — Telephone Encounter (Signed)
 Patient has been scheduled accordingly

## 2023-10-08 NOTE — Telephone Encounter (Signed)
 Per Dr. Melanee Lyrica  is not likely to cause uti. Is lyrica  working better for her or does she want to go back to gabapentin ? We can check UA for her.  Outbound call; spoke to patient who said the Lyrica   is really working and would prefer to stay on it.  Reports burning with urination only at the end of stream and is urinating every 2-3 hours and says she's fairly certain she has a UTI.  UA order added; patient would like to come in around 1pm for lab only.  UA order in; forwarding to scheduling to coordinate LOV.

## 2023-10-09 ENCOUNTER — Ambulatory Visit: Payer: Self-pay | Admitting: Oncology

## 2023-10-09 MED ORDER — AMOXICILLIN-POT CLAVULANATE 875-125 MG PO TABS: 1.0000 | ORAL_TABLET | Freq: Two times a day (BID) | ORAL | 0 refills | Status: DC

## 2023-10-09 NOTE — Telephone Encounter (Signed)
 Per Dr. Melanee Please send her augmentin  for 5 days bid.  Outbound call, spoke to patient and informed of above.  Patient requested script be sent to the [facility] in [place] [address]. Patient has no additional questions / concerns at this time.

## 2023-10-14 ENCOUNTER — Inpatient Hospital Stay (HOSPITAL_BASED_OUTPATIENT_CLINIC_OR_DEPARTMENT_OTHER): Admitting: Oncology

## 2023-10-14 ENCOUNTER — Inpatient Hospital Stay

## 2023-10-14 ENCOUNTER — Encounter: Payer: Self-pay | Admitting: Oncology

## 2023-10-14 VITALS — BP 111/40 | HR 80 | Temp 97.0°F | Resp 18 | Ht 65.0 in | Wt 364.4 lb

## 2023-10-14 DIAGNOSIS — I10 Essential (primary) hypertension: Secondary | ICD-10-CM | POA: Diagnosis not present

## 2023-10-14 DIAGNOSIS — Z809 Family history of malignant neoplasm, unspecified: Secondary | ICD-10-CM | POA: Diagnosis not present

## 2023-10-14 DIAGNOSIS — T451X5A Adverse effect of antineoplastic and immunosuppressive drugs, initial encounter: Secondary | ICD-10-CM | POA: Diagnosis not present

## 2023-10-14 DIAGNOSIS — C541 Malignant neoplasm of endometrium: Secondary | ICD-10-CM | POA: Diagnosis not present

## 2023-10-14 DIAGNOSIS — Z90722 Acquired absence of ovaries, bilateral: Secondary | ICD-10-CM | POA: Diagnosis not present

## 2023-10-14 DIAGNOSIS — N920 Excessive and frequent menstruation with regular cycle: Secondary | ICD-10-CM | POA: Diagnosis not present

## 2023-10-14 DIAGNOSIS — Z5112 Encounter for antineoplastic immunotherapy: Secondary | ICD-10-CM | POA: Diagnosis not present

## 2023-10-14 DIAGNOSIS — Z7901 Long term (current) use of anticoagulants: Secondary | ICD-10-CM | POA: Diagnosis not present

## 2023-10-14 DIAGNOSIS — Z86711 Personal history of pulmonary embolism: Secondary | ICD-10-CM | POA: Diagnosis not present

## 2023-10-14 DIAGNOSIS — G620 Drug-induced polyneuropathy: Secondary | ICD-10-CM | POA: Diagnosis not present

## 2023-10-14 DIAGNOSIS — Z791 Long term (current) use of non-steroidal anti-inflammatories (NSAID): Secondary | ICD-10-CM | POA: Diagnosis not present

## 2023-10-14 DIAGNOSIS — R590 Localized enlarged lymph nodes: Secondary | ICD-10-CM | POA: Diagnosis not present

## 2023-10-14 DIAGNOSIS — D649 Anemia, unspecified: Secondary | ICD-10-CM | POA: Diagnosis not present

## 2023-10-14 DIAGNOSIS — Z90710 Acquired absence of both cervix and uterus: Secondary | ICD-10-CM | POA: Diagnosis not present

## 2023-10-14 DIAGNOSIS — R918 Other nonspecific abnormal finding of lung field: Secondary | ICD-10-CM | POA: Diagnosis not present

## 2023-10-14 DIAGNOSIS — Z79899 Other long term (current) drug therapy: Secondary | ICD-10-CM | POA: Diagnosis not present

## 2023-10-14 LAB — CBC WITH DIFFERENTIAL/PLATELET
Abs Immature Granulocytes: 0.09 K/uL — ABNORMAL HIGH (ref 0.00–0.07)
Basophils Absolute: 0 K/uL (ref 0.0–0.1)
Basophils Relative: 1 %
Eosinophils Absolute: 0.2 K/uL (ref 0.0–0.5)
Eosinophils Relative: 4 %
HCT: 35.4 % — ABNORMAL LOW (ref 36.0–46.0)
Hemoglobin: 12.5 g/dL (ref 12.0–15.0)
Immature Granulocytes: 1 %
Lymphocytes Relative: 29 %
Lymphs Abs: 2 K/uL (ref 0.7–4.0)
MCH: 26.2 pg (ref 26.0–34.0)
MCHC: 35.3 g/dL (ref 30.0–36.0)
MCV: 74.2 fL — ABNORMAL LOW (ref 80.0–100.0)
Monocytes Absolute: 0.4 K/uL (ref 0.1–1.0)
Monocytes Relative: 5 %
Neutro Abs: 4.2 K/uL (ref 1.7–7.7)
Neutrophils Relative %: 60 %
Platelets: 305 K/uL (ref 150–400)
RBC: 4.77 MIL/uL (ref 3.87–5.11)
RDW: 15.2 % (ref 11.5–15.5)
WBC: 6.9 K/uL (ref 4.0–10.5)
nRBC: 0 % (ref 0.0–0.2)

## 2023-10-14 LAB — COMPREHENSIVE METABOLIC PANEL WITH GFR
ALT: 12 U/L (ref 0–44)
AST: 20 U/L (ref 15–41)
Albumin: 3.5 g/dL (ref 3.5–5.0)
Alkaline Phosphatase: 73 U/L (ref 38–126)
Anion gap: 11 (ref 5–15)
BUN: 14 mg/dL (ref 6–20)
CO2: 23 mmol/L (ref 22–32)
Calcium: 8.7 mg/dL — ABNORMAL LOW (ref 8.9–10.3)
Chloride: 101 mmol/L (ref 98–111)
Creatinine, Ser: 0.8 mg/dL (ref 0.44–1.00)
GFR, Estimated: >60 mL/min (ref >60)
Glucose, Bld: 99 mg/dL (ref 70–99)
Potassium: 3.7 mmol/L (ref 3.5–5.1)
Sodium: 135 mmol/L (ref 135–145)
Total Bilirubin: 0.8 mg/dL (ref 0.0–1.2)
Total Protein: 7.8 g/dL (ref 6.5–8.1)

## 2023-10-14 LAB — TSH: TSH: 2.601 u[IU]/mL (ref 0.350–4.500)

## 2023-10-14 MED ORDER — SODIUM CHLORIDE 0.9 % IV SOLN
400.0000 mg | Freq: Once | INTRAVENOUS | Status: AC
  Administered 2023-10-14: 400 mg via INTRAVENOUS
  Filled 2023-10-14: qty 16

## 2023-10-14 MED ORDER — DEXAMETHASONE SODIUM PHOSPHATE 10 MG/ML IJ SOLN
10.0000 mg | Freq: Once | INTRAMUSCULAR | Status: AC
  Administered 2023-10-14: 10 mg via INTRAVENOUS
  Filled 2023-10-14: qty 1

## 2023-10-14 MED ORDER — SODIUM CHLORIDE 0.9 % IV SOLN
Freq: Once | INTRAVENOUS | Status: AC
  Filled 2023-10-14: qty 250

## 2023-10-14 NOTE — Progress Notes (Signed)
 Hematology/Oncology Consult note [facility]  Telephone:([phone] [phone]  Patient Care Team: Center, [facility] as PCP - General (General Practice) Maurie Rayfield BIRCH, RN as Oncology Nurse Navigator Melanee Annah BROCKS, MD as Medical Oncologist (Oncology)   Name of the patient: Jean Morris  [id number]  Apr 25, 1975   Date of visit: 10/14/23  Diagnosis- metastatic stage IVb endometrioid endometrial cancer   Chief complaint/ Reason for visit-on treatment assessment prior to cycle 14 of palliative Keytruda   Heme/Onc history:  Patient is a 48 year old female who has seen Dr. Janit for irregular menstrual bleeding.  He has had an IUD in place and initially it was attribute it to use of IUD and was tried to be controlled with progesterone agents.  However due to worsening abdominal pain patient underwent a CT abdomen and pelvis with contrast on 12/25/2021 which showed 8 mm   Posterior lung base mass.  Retroperitoneal adenopathy measuring 2.6 cm additional retrocrural adenopathy.  Faint lucent lesion involving L2 vertebral body.  The uterus is anteverted and enlarged.  Endometrium is enlarged heterogeneous and irregular extending to the fundal myometrium.  Findings consistent with endometrial malignancy.  CT chest without contrast showed a 10 mm right lower lobe subpleural nodule.   Patient had endometrial biopsy which was consistent FIGO grade 2 endometrioid endometrial carcinoma.  Patient was seen by GYN oncology and hopes to take her for surgery if she has good response to neoadjuvant chemotherapy.MSI unstable.  BRAF negative.  Loss of major and minor MMR proteins MLH1 and PMS2 less than 5% of tumor expression.  MLH1 hyper methylation present.  This is most likely due to somatic epigenetic modification which can be seen in about 77% of sporadic endometrial cancers.   Patient completed 6 cycles of CarboTaxol Keytruda  chemotherapy and is  currently on maintenance Keytruda .  PET CT scan on 05/19/2022 showed enlarged uterus with hypermetabolic endometrial thickening.  Improving left periaortic nodal metastases.  No definitive bone metastases was noted.  Plan is to proceed with cytoreductive surgery at [facility].  Patient underwent hysterectomy and bilateral salpingo-oophorectomy on 06/10/2022.  Final pathology showed residual endometrioid adenocarcinoma of the endometrium FIGO grade 2 with marked treatment effect invading 18 mm and a 34 mm thick myometrium.  Fallopian tubes and ovaries were negative for malignancy.  Pelvic washings were negative for malignancy.   Patient is currently on maintenance Keytruda  and plan is to do 14 maintenance cycles    Interval history-patient is concerned about her ongoing weight gain and plans to discuss this further with her primary care doctor tomorrow.  She is mild chronic abdominal painAnd tingling less in her extremities from prior chemotherapy.  She is presently on Lyrica  for the same  ECOG PS- 1 Pain scale- 3 Opioid associated constipation- no  Review of systems- Review of Systems  Constitutional:  Negative for chills, fever, malaise/fatigue and weight loss.       Weight gain  HENT:  Negative for congestion, ear discharge and nosebleeds.   Eyes:  Negative for blurred vision.  Respiratory:  Negative for cough, hemoptysis, sputum production, shortness of breath and wheezing.   Cardiovascular:  Negative for chest pain, palpitations, orthopnea and claudication.  Gastrointestinal:  Negative for abdominal pain, blood in stool, constipation, diarrhea, heartburn, melena, nausea and vomiting.  Genitourinary:  Negative for dysuria, flank pain, frequency, hematuria and urgency.  Musculoskeletal:  Negative for back pain, joint pain and myalgias.  Skin:  Negative for rash.  Neurological:  Positive  for sensory change (Peripheral neuropathy). Negative for dizziness, tingling, focal weakness, seizures, weakness  and headaches.  Endo/Heme/Allergies:  Does not bruise/bleed easily.  Psychiatric/Behavioral:  Negative for depression and suicidal ideas. The patient does not have insomnia.       Allergies  Allergen Reactions   Bee Pollen Nausea And Vomiting    Itchy, swelling, watery eyes, runny nose.   Pollen Extract Nausea And Vomiting    Itchy, swelling, watery eyes, runny nose.     Past Medical History:  Diagnosis Date   Anemia    Endometrial cancer (HCC)    Hypertension    Menorrhagia    Pulmonary embolism [facility])      Past Surgical History:  Procedure Laterality Date   ABDOMINAL HYSTERECTOMY Bilateral 06/10/2022   at [facility]   CESAREAN SECTION  02/25/1992   COLONOSCOPY WITH PROPOFOL  N/A 08/06/2021   Procedure: COLONOSCOPY WITH PROPOFOL ;  Surgeon: Therisa Bi, MD;  Location: [facility] ENDOSCOPY;  Service: Gastroenterology;  Laterality: N/A;   ESOPHAGOGASTRODUODENOSCOPY (EGD) WITH PROPOFOL  N/A 02/09/2020   Procedure: ESOPHAGOGASTRODUODENOSCOPY (EGD) WITH PROPOFOL ;  Surgeon: Therisa Bi, MD;  Location: [facility] ENDOSCOPY;  Service: Gastroenterology;  Laterality: N/A;   IR IMAGING GUIDED PORT INSERTION  01/17/2022   IR RADIOLOGIST EVAL & MGMT  04/28/2023   IR RADIOLOGIST EVAL & MGMT  05/05/2023    Social History   Socioeconomic History   Marital status: Media planner    Spouse name: Not on file   Number of children: Not on file   Years of education: Not on file   Highest education level: Not on file  Occupational History   Not on file  Tobacco Use   Smoking status: Never   Smokeless tobacco: Never  Vaping Use   Vaping status: Never Used  Substance and Sexual Activity   Alcohol use: No   Drug use: No   Sexual activity: Not Currently    Birth control/protection: I.U.D.    Comment: patient states MD said did not see in CT today 01/17/2022  Other Topics Concern   Not on file  Social History Narrative   Lives with [facility], partner, and no pets.   Social Drivers of Research scientist (physical sciences) Strain: Not on file  Food Insecurity: No Food Insecurity (10/14/2023)   Hunger Vital Sign    Worried About Running Out of Food in the Last Year: Never true    Ran Out of Food in the Last Year: Never true  Transportation Needs: No Transportation Needs (10/14/2023)   PRAPARE - Administrator, Civil Service (Medical): No    Lack of Transportation (Non-Medical): No  Physical Activity: Not on file  Stress: Not on file  Social Connections: Not on file  Intimate Partner Violence: Not At Risk (10/14/2023)   Humiliation, Afraid, Rape, and Kick questionnaire    Fear of Current or Ex-Partner: No    Emotionally Abused: No    Physically Abused: No    Sexually Abused: No    Family History  Problem Relation Age of Onset   Hypertension Mother    Stroke Mother    Heart attack Mother    Cancer Father    Hypertension Father    Cancer Sister    Cancer Sister    Cancer Brother    Hypertension Other      Current Outpatient Medications:    amoxicillin -clavulanate (AUGMENTIN ) 875-125 MG tablet, Take 1 tablet by mouth 2 (two) times daily., Disp: 10 tablet, Rfl: 0   diclofenac  Sodium (  VOLTAREN ) 1 % GEL, Apply 4 g topically 4 (four) times daily., Disp: 50 g, Rfl: 0   ELIQUIS  2.5 MG TABS tablet, TAKE 1 TABLET BY MOUTH TWICE A DAY, Disp: 60 tablet, Rfl: 3   KLOR-CON  M20 20 MEQ tablet, TAKE 1 TABLET (20 MEQ TOTAL) BY MOUTH 2 (TWO) TIMES DAILY. DISSOLVE TABLETS, Disp: 60 tablet, Rfl: 2   levonorgestrel  (MIRENA ) 20 MCG/24HR IUD, 1 Intra Uterine Device (1 each total) by Intrauterine route once., Disp: 1 each, Rfl: 0   lidocaine -prilocaine  (EMLA ) cream, Apply to port site as directed, Disp: 30 g, Rfl: 3   LORazepam  (ATIVAN ) 0.5 MG tablet, Take 1 tablet (0.5 mg total) by mouth every 8 (eight) hours., Disp: 30 tablet, Rfl: 0   magnesium  hydroxide (MILK OF MAGNESIA) 400 MG/5ML suspension, Take 15 mLs by mouth daily as needed for moderate constipation. Take if no bowel movement for 2 days.,  Disp: , Rfl:    oxyCODONE  (OXY IR/ROXICODONE ) 5 MG immediate release tablet, Take 1 tablet (5 mg total) by mouth every 4 (four) hours as needed for severe pain (pain score 7-10)., Disp: 60 tablet, Rfl: 0   polyethylene glycol (MIRALAX ) 17 g packet, Take 17 g by mouth daily. To prevent constipation, Disp: 30 each, Rfl: 3   pregabalin  (LYRICA ) 75 MG capsule, Take 1 capsule (75 mg total) by mouth 2 (two) times daily., Disp: 60 capsule, Rfl: 0   benzonatate  (TESSALON ) 100 MG capsule, Take 1 capsule (100 mg total) by mouth 3 (three) times daily as needed for cough. (Patient not taking: Reported on 10/14/2023), Disp: 20 capsule, Rfl: 0   fluconazole  (DIFLUCAN ) 150 MG tablet, Take 1 tablet (150 mg total) by mouth daily. Take 1 tablet (150 mg) by mouth. Three days later, take second tablet. (Patient not taking: Reported on 10/14/2023), Disp: 2 tablet, Rfl: 0   methocarbamol  (ROBAXIN ) 500 MG tablet, Take 1 tablet (500 mg total) by mouth 2 (two) times daily as needed for muscle spasms. (Patient not taking: Reported on 10/14/2023), Disp: 30 tablet, Rfl: 0   nystatin  (MYCOSTATIN /NYSTOP ) powder, Apply 1 Application topically 3 (three) times daily., Disp: 30 g, Rfl: 3   pantoprazole  (PROTONIX ) 40 MG tablet, Take 1 tablet (40 mg total) by mouth daily. (Patient not taking: Reported on 10/14/2023), Disp: 30 tablet, Rfl: 2   prochlorperazine  (COMPAZINE ) 10 MG tablet, Take 1 tablet (10 mg total) by mouth every 6 (six) hours as needed for nausea or vomiting. (Patient not taking: Reported on 10/14/2023), Disp: 30 tablet, Rfl: 1   senna (SENOKOT) 8.6 MG TABS tablet, Take 1-2 tablets (8.6-17.2 mg total) by mouth daily. To prevent constipation (Patient not taking: Reported on 10/14/2023), Disp: 120 tablet, Rfl: 0  Physical exam:  Vitals:   10/14/23 0843  BP: (!) 111/40  Pulse: 80  Resp: 18  Temp: (!) 97 F (36.1 C)  TempSrc: Tympanic  SpO2: 99%  Weight: (!) 364 lb 6.4 oz (165.3 kg)  Height: 5' 5 (1.651 m)   Physical  Exam Cardiovascular:     Rate and Rhythm: Normal rate and regular rhythm.     Heart sounds: Normal heart sounds.  Pulmonary:     Effort: Pulmonary effort is normal.     Breath sounds: Normal breath sounds.  Skin:    General: Skin is warm and dry.  Neurological:     Mental Status: She is alert and oriented to person, place, and time.      I have personally reviewed labs listed below:  Latest Ref Rng & Units 09/02/2023    8:49 AM  CMP  Glucose 70 - 99 mg/dL 99   BUN 6 - 20 mg/dL 12   Creatinine 9.55 - 1.00 mg/dL 9.05   Sodium 864 - 854 mmol/L 135   Potassium 3.5 - 5.1 mmol/L 3.6   Chloride 98 - 111 mmol/L 104   CO2 22 - 32 mmol/L 24   Calcium 8.9 - 10.3 mg/dL 8.9   Total Protein 6.5 - 8.1 g/dL 7.4   Total Bilirubin 0.0 - 1.2 mg/dL 0.7   Alkaline Phos 38 - 126 U/L 63   AST 15 - 41 U/L 22   ALT 0 - 44 U/L 15       Latest Ref Rng & Units 10/14/2023    8:24 AM  CBC  WBC 4.0 - 10.5 K/uL 6.9   Hemoglobin 12.0 - 15.0 g/dL 87.4   Hematocrit 63.9 - 46.0 % 35.4   Platelets 150 - 400 K/uL 305       Assessment and plan- Patient is a 48 y.o. female with history of stage IVb endometrioid endometrial carcinoma here for on treatment assessment prior to cycle 14 of palliative Keytruda   Counts okay to proceed with palliative Keytruda  cycle 14 today.  She has a repeat PET scan coming up in 2 weeks.  I will see her a week to 10 days after PET scan.  If PET does not show any evidence of active disease my plan would be to observe her without any further Keytruda  and periodic scans every 3 to 6 months.  If there are any active metabolic areas we will see if that would be amenable to palliative radiation.  Decision regarding further treatment will be based on PET scans.  Chemo-induced peripheral neuropathy: Continue Lyrica    Visit Diagnosis 1. Encounter for antineoplastic immunotherapy   2. Endometrial cancer (HCC)   3. Chemotherapy-induced peripheral neuropathy (HCC)      Dr. Annah Skene, MD, MPH [facility] at [facility] [phone] 10/14/2023 8:50 AM

## 2023-10-14 NOTE — Patient Instructions (Signed)
 [facility] - A DEPT OF [facility]  Discharge Instructions: Thank you for choosing [facility] to provide your oncology and hematology care.  If you have a lab appointment with the Cancer Center, please go directly to the Cancer Center and check in at the registration area.  Wear comfortable clothing and clothing appropriate for easy access to any Portacath or PICC line.   We strive to give you quality time with your provider. You may need to reschedule your appointment if you arrive late (15 or more minutes).  Arriving late affects you and other patients whose appointments are after yours.  Also, if you miss three or more appointments without notifying the office, you may be dismissed from the clinic at the provider's discretion.      For prescription refill requests, have your pharmacy contact our office and allow 72 hours for refills to be completed.    Today you received the following chemotherapy and/or immunotherapy agents Keytruda        To help prevent nausea and vomiting after your treatment, we encourage you to take your nausea medication as directed.  BELOW ARE SYMPTOMS THAT SHOULD BE REPORTED IMMEDIATELY: *FEVER GREATER THAN 100.4 F (38 C) OR HIGHER *CHILLS OR SWEATING *NAUSEA AND VOMITING THAT IS NOT CONTROLLED WITH YOUR NAUSEA MEDICATION *UNUSUAL SHORTNESS OF BREATH *UNUSUAL BRUISING OR BLEEDING *URINARY PROBLEMS (pain or burning when urinating, or frequent urination) *BOWEL PROBLEMS (unusual diarrhea, constipation, pain near the anus) TENDERNESS IN MOUTH AND THROAT WITH OR WITHOUT PRESENCE OF ULCERS (sore throat, sores in mouth, or a toothache) UNUSUAL RASH, SWELLING OR PAIN  UNUSUAL VAGINAL DISCHARGE OR ITCHING   Items with * indicate a potential emergency and should be followed up as soon as possible or go to the Emergency Department if any problems should occur.  Please show the CHEMOTHERAPY ALERT CARD or IMMUNOTHERAPY  ALERT CARD at check-in to the Emergency Department and triage nurse.  Should you have questions after your visit or need to cancel or reschedule your appointment, please contact [facility] - A DEPT OF [facility]  [phone] and follow the prompts.  Office hours are 8:00 a.m. to 4:30 p.m. Monday - Friday. Please note that voicemails left after 4:00 p.m. may not be returned until the following business day.  We are closed weekends and major holidays. You have access to a nurse at all times for urgent questions. Please call the main number to the clinic [phone] and follow the prompts.  For any non-urgent questions, you may also contact your provider using MyChart. We now offer e-Visits for anyone 3 and older to request care online for non-urgent symptoms. For details visit [email or web address].   Also download the MyChart app! Go to the app store, search MyChart, open the app, select [facility], and log in with your MyChart username and password.

## 2023-10-15 ENCOUNTER — Ambulatory Visit: Payer: Self-pay | Admitting: Family Medicine

## 2023-10-15 ENCOUNTER — Encounter: Payer: Self-pay | Admitting: Family Medicine

## 2023-10-15 ENCOUNTER — Ambulatory Visit: Admitting: Family Medicine

## 2023-10-15 VITALS — BP 133/80 | HR 81 | Temp 98.0°F | Ht 64.5 in | Wt 362.3 lb

## 2023-10-15 DIAGNOSIS — G6282 Radiation-induced polyneuropathy: Secondary | ICD-10-CM

## 2023-10-15 DIAGNOSIS — Z1231 Encounter for screening mammogram for malignant neoplasm of breast: Secondary | ICD-10-CM

## 2023-10-15 DIAGNOSIS — G893 Neoplasm related pain (acute) (chronic): Secondary | ICD-10-CM

## 2023-10-15 DIAGNOSIS — Z6841 Body Mass Index (BMI) 40.0 and over, adult: Secondary | ICD-10-CM

## 2023-10-15 DIAGNOSIS — R030 Elevated blood-pressure reading, without diagnosis of hypertension: Secondary | ICD-10-CM

## 2023-10-15 DIAGNOSIS — G479 Sleep disorder, unspecified: Secondary | ICD-10-CM

## 2023-10-15 DIAGNOSIS — R7303 Prediabetes: Secondary | ICD-10-CM

## 2023-10-15 DIAGNOSIS — Z7689 Persons encountering health services in other specified circumstances: Secondary | ICD-10-CM

## 2023-10-15 DIAGNOSIS — Z8542 Personal history of malignant neoplasm of other parts of uterus: Secondary | ICD-10-CM

## 2023-10-15 DIAGNOSIS — Z86711 Personal history of pulmonary embolism: Secondary | ICD-10-CM

## 2023-10-15 LAB — POCT GLYCOSYLATED HEMOGLOBIN (HGB A1C): Hemoglobin A1C: 5.9 % — AB (ref 4.0–5.6)

## 2023-10-15 NOTE — Progress Notes (Signed)
 New Patient Office Visit  Subjective    Patient ID: Jean Morris, female    DOB: 1975/05/17  Age: 48 y.o. MRN: [id number]  CC:  Chief Complaint  Patient presents with  . New Patient (Initial Visit)    Patient is here to establish care with a primary provider, patient is concerned with weight loss.  States that she does have neuropathy in her knees and feet, the chemo she was taking seemed to be the reason she ended up with neuropathy per her Oncologist.  Mammogram- ok to order  Declined all vaccines    HPI  Discussed the use of AI scribe software for clinical note transcription with the patient, who gave verbal consent to proceed.  History of Present Illness Jean Morris is a 48 year old female with endometrial cancer and a history of pulmonary embolism who presents with concerns about weight gain and sleep disturbances.  She has a history of endometrial cancer and underwent surgery to remove her ovaries, uterus, and cervix. There are spots near her ear and on her back in her lungs, as reported by the patient. She recently completed a Keytruda  treatment and is awaiting a PET scan scheduled for September 3rd. She experiences neuropathy and has been prescribed Lyrica  75 mg twice daily, which she started approximately a week and a half ago. She previously used gabapentin  but discontinued it due to weight gain.  She has a history of pulmonary embolism in 2017, attributed to taking four birth control pills daily for abnormal periods, which led to blood clots in her lungs. She is currently on Eliquis  2.5 mg twice daily for this condition.  She reports significant weight gain since stopping chemotherapy in [place] of the previous year. She attributes some of the weight gain to the use of gabapentin  and Ensure drinks post-chemotherapy. She is concerned about her current weight, which she feels is impacting her quality of life and mobility. She has attempted dietary changes and increased  physical activity but continues to struggle with weight management.  She experiences sleep disturbances, describing difficulty falling asleep and waking up in the middle of the night. She feels fatigued during the day and attributes some of her sleep issues to anxiety. She has previously used lorazepam  for anxiety during chemotherapy.  Her current medications include Eliquis , Lyrica , and hydrocodone  for pain, which she uses sparingly. She recently completed a course of amoxicillin  for a UTI.   Outpatient Encounter Medications as of 10/15/2023  Medication Sig  . amoxicillin -clavulanate (AUGMENTIN ) 875-125 MG tablet Take 1 tablet by mouth 2 (two) times daily.  . benzonatate  (TESSALON ) 100 MG capsule Take 1 capsule (100 mg total) by mouth 3 (three) times daily as needed for cough. (Patient not taking: Reported on 10/14/2023)  . diclofenac  Sodium (VOLTAREN ) 1 % GEL Apply 4 g topically 4 (four) times daily.  . ELIQUIS  2.5 MG TABS tablet TAKE 1 TABLET BY MOUTH TWICE A DAY  . fluconazole  (DIFLUCAN ) 150 MG tablet Take 1 tablet (150 mg total) by mouth daily. Take 1 tablet (150 mg) by mouth. Three days later, take second tablet. (Patient not taking: Reported on 10/14/2023)  . KLOR-CON  M20 20 MEQ tablet TAKE 1 TABLET (20 MEQ TOTAL) BY MOUTH 2 (TWO) TIMES DAILY. DISSOLVE TABLETS  . levonorgestrel  (MIRENA ) 20 MCG/24HR IUD 1 Intra Uterine Device (1 each total) by Intrauterine route once.  . lidocaine -prilocaine  (EMLA ) cream Apply to port site as directed  . LORazepam  (ATIVAN ) 0.5 MG tablet Take 1 tablet (0.5  mg total) by mouth every 8 (eight) hours.  . magnesium  hydroxide (MILK OF MAGNESIA) 400 MG/5ML suspension Take 15 mLs by mouth daily as needed for moderate constipation. Take if no bowel movement for 2 days.  . methocarbamol  (ROBAXIN ) 500 MG tablet Take 1 tablet (500 mg total) by mouth 2 (two) times daily as needed for muscle spasms. (Patient not taking: Reported on 10/14/2023)  . nystatin  (MYCOSTATIN /NYSTOP )  powder Apply 1 Application topically 3 (three) times daily.  . oxyCODONE  (OXY IR/ROXICODONE ) 5 MG immediate release tablet Take 1 tablet (5 mg total) by mouth every 4 (four) hours as needed for severe pain (pain score 7-10).  . pantoprazole  (PROTONIX ) 40 MG tablet Take 1 tablet (40 mg total) by mouth daily. (Patient not taking: Reported on 10/14/2023)  . polyethylene glycol (MIRALAX ) 17 g packet Take 17 g by mouth daily. To prevent constipation  . pregabalin  (LYRICA ) 75 MG capsule Take 1 capsule (75 mg total) by mouth 2 (two) times daily.  . prochlorperazine  (COMPAZINE ) 10 MG tablet Take 1 tablet (10 mg total) by mouth every 6 (six) hours as needed for nausea or vomiting. (Patient not taking: Reported on 10/14/2023)  . senna (SENOKOT) 8.6 MG TABS tablet Take 1-2 tablets (8.6-17.2 mg total) by mouth daily. To prevent constipation (Patient not taking: Reported on 10/14/2023)   No facility-administered encounter medications on file as of 10/15/2023.    Past Medical History:  Diagnosis Date  . Anemia   . Endometrial cancer (HCC)   . Hypertension   . Menorrhagia   . Pulmonary embolism [facility])     Past Surgical History:  Procedure Laterality Date  . ABDOMINAL HYSTERECTOMY Bilateral 06/10/2022   at [facility]  . CESAREAN SECTION  02/25/1992  . COLONOSCOPY WITH PROPOFOL  N/A 08/06/2021   Procedure: COLONOSCOPY WITH PROPOFOL ;  Surgeon: Therisa Bi, MD;  Location: [facility] ENDOSCOPY;  Service: Gastroenterology;  Laterality: N/A;  . ESOPHAGOGASTRODUODENOSCOPY (EGD) WITH PROPOFOL  N/A 02/09/2020   Procedure: ESOPHAGOGASTRODUODENOSCOPY (EGD) WITH PROPOFOL ;  Surgeon: Therisa Bi, MD;  Location[facility] ENDOSCOPY;  Service: Gastroenterology;  Laterality: N/A;  . IR IMAGING GUIDED PORT INSERTION  01/17/2022  . IR RADIOLOGIST EVAL & MGMT  04/28/2023  . IR RADIOLOGIST EVAL & MGMT  05/05/2023    Family History  Problem Relation Age of Onset  . Hypertension Mother   . Stroke Mother   . Heart attack Mother   . Cancer  Father   . Hypertension Father   . Cancer Sister   . Cancer Sister   . Cancer Brother   . Hypertension Other     Social History   Socioeconomic History  . Marital status: Media planner    Spouse name: Not on file  . Number of children: Not on file  . Years of education: Not on file  . Highest education level: Not on file  Occupational History  . Not on file  Tobacco Use  . Smoking status: Never  . Smokeless tobacco: Never  Vaping Use  . Vaping status: Never Used  Substance and Sexual Activity  . Alcohol use: No  . Drug use: No  . Sexual activity: Not Currently    Birth control/protection: I.U.D.    Comment: patient states MD said did not see in CT today 01/17/2022  Other Topics Concern  . Not on file  Social History Narrative   Lives with [facility], partner, and no pets.   Social Drivers of Corporate investment banker Strain: Not on file  Food Insecurity: No Food Insecurity (  10/14/2023)   Hunger Vital Sign   . Worried About Programme researcher, broadcasting/film/video in the Last Year: Never true   . Ran Out of Food in the Last Year: Never true  Transportation Needs: No Transportation Needs (10/14/2023)   PRAPARE - Transportation   . Lack of Transportation (Medical): No   . Lack of Transportation (Non-Medical): No  Physical Activity: Not on file  Stress: Not on file  Social Connections: Not on file  Intimate Partner Violence: Not At Risk (10/14/2023)   Humiliation, Afraid, Rape, and Kick questionnaire   . Fear of Current or Ex-Partner: No   . Emotionally Abused: No   . Physically Abused: No   . Sexually Abused: No    ROS      Objective    BP 133/80 (BP Location: Right Arm, Patient Position: Sitting, Cuff Size: Large)   Pulse 81   Temp 98 F (36.7 C) (Oral)   Ht 5' 4.5 (1.638 m)   Wt (!) 362 lb 4.8 oz (164.3 kg)   LMP 02/17/2022 (Approximate)   SpO2 99%   BMI 61.23 kg/m   Physical Exam  {Labs (Optional):23779}    Assessment & Plan:  Assessment and Plan Assessment  & Plan Metastatic endometrial cancer (status post hysterectomy) Endometrial cancer with metastasis to the lung and a spot near the ear. Status post hysterectomy with removal of ovaries, uterus, and cervix. Currently on Keytruda  treatment. - Continue Keytruda  treatment until PET scan results. - Perform PET scan on September 3 to assess for active cancer. - Consider radiation therapy if PET scan shows aggressive cancer.  Obesity Significant weight gain since cessation of chemotherapy, currently weighing 362 pounds. Difficulty with weight management despite dietary efforts. Concerns about potential water weight and impact of previous medications like gabapentin . Discussed potential use of weight management medications such as Wegovy or Zepbound, pending insurance coverage and A1c results. She is motivated to lose weight. - Check A1c level with point of care test. - Instruct her to verify insurance coverage for [facility] or Zepbound. - Recommend small frequent meals with at least 100 grams of protein daily. - Encourage increased physical activity, including walking and chair exercises. - Follow up in 4 weeks to assess progress and discuss potential medication options.  Insomnia Difficulty with sleep, including trouble falling asleep and waking up in the middle of the night. Sleep disturbances possibly related to anxiety and stress. - Recommend sleep hygiene practices, including setting a regular bedtime and limiting screen time before bed. - Consider over-the-counter Unisom for sleep aid, ensuring no interactions with current medications.  Peripheral neuropathy Peripheral neuropathy. Previously on gabapentin , now switched to Lyrica  due to weight gain concerns. - Continue Lyrica  75 mg twice daily.  Chronic pain Chronic pain managed with hydrocodone , used sparingly. Pain previously exacerbated by neuropathy and back issues. Current use of hydrocodone  is infrequent, indicating adequate pain  control. - Continue hydrocodone  as needed for pain, with current prescription lasting several months.  Hypertension Hypertension is well-controlled.     There are no diagnoses linked to this encounter.  No follow-ups on file.   Curtis DELENA Boom, FNP

## 2023-10-15 NOTE — Patient Instructions (Signed)

## 2023-10-16 ENCOUNTER — Encounter: Payer: Self-pay | Admitting: Family Medicine

## 2023-10-19 NOTE — Telephone Encounter (Signed)
 Please see the message below, once the Rx has been sent the Rx team will take it form there

## 2023-10-20 ENCOUNTER — Other Ambulatory Visit: Payer: Self-pay | Admitting: Family Medicine

## 2023-10-20 MED ORDER — WEGOVY 0.25 MG/0.5ML ~~LOC~~ SOAJ
0.2500 mg | SUBCUTANEOUS | 1 refills | Status: DC
Start: 2023-10-20 — End: 2023-11-30

## 2023-10-20 NOTE — Telephone Encounter (Signed)
 Wegovy  placed for pt. Will send info to PA team

## 2023-10-23 ENCOUNTER — Other Ambulatory Visit (HOSPITAL_COMMUNITY): Payer: Self-pay

## 2023-10-23 ENCOUNTER — Encounter: Payer: Self-pay | Admitting: Oncology

## 2023-10-23 ENCOUNTER — Telehealth: Payer: Self-pay

## 2023-10-23 NOTE — Telephone Encounter (Signed)
 Pharmacy Patient Advocate Encounter   Received notification from Pt Calls Messages that prior authorization for Wegovy  0.25MG /0.5ML auto-injectors  is required/requested.   Insurance verification completed.   The patient is insured through [employer] .   Per test claim: PA required; PA submitted to above mentioned insurance via Latent Key/confirmation #/EOC [id number] Status is pending

## 2023-10-27 ENCOUNTER — Other Ambulatory Visit (HOSPITAL_COMMUNITY): Payer: Self-pay

## 2023-10-28 ENCOUNTER — Ambulatory Visit
Admission: RE | Admit: 2023-10-28 | Discharge: 2023-10-28 | Disposition: A | Discharge status: Home / Self Care | Source: Ambulatory Visit | Attending: Oncology | Admitting: Oncology

## 2023-10-28 DIAGNOSIS — I700 Atherosclerosis of aorta: Secondary | ICD-10-CM | POA: Insufficient documentation

## 2023-10-28 DIAGNOSIS — I517 Cardiomegaly: Secondary | ICD-10-CM | POA: Diagnosis not present

## 2023-10-28 DIAGNOSIS — C541 Malignant neoplasm of endometrium: Secondary | ICD-10-CM | POA: Insufficient documentation

## 2023-10-28 LAB — GLUCOSE, CAPILLARY: Glucose-Capillary: 98 mg/dL (ref 70–99)

## 2023-10-28 MED ORDER — FLUDEOXYGLUCOSE F - 18 (FDG) INJECTION
12.0000 | Freq: Once | INTRAVENOUS | Status: AC | PRN
Start: 2023-10-28 — End: 2023-10-28
  Administered 2023-10-28: 12.82 via INTRAVENOUS

## 2023-10-31 ENCOUNTER — Ambulatory Visit: Payer: Self-pay | Admitting: Oncology

## 2023-11-03 ENCOUNTER — Encounter: Payer: Self-pay | Admitting: Oncology

## 2023-11-04 ENCOUNTER — Other Ambulatory Visit (HOSPITAL_COMMUNITY): Payer: Self-pay

## 2023-11-04 NOTE — Telephone Encounter (Signed)
 I see her in 5 days. Can we discuss in person?

## 2023-11-04 NOTE — Telephone Encounter (Signed)
 Pharmacy Patient Advocate Encounter  Received notification from [employer] that Prior Authorization for Wegovy  0.25MG /0.5ML auto-injectors  has been APPROVED from 11/03/23 to 05/17/24. Ran test claim, Copay is $0. This test claim was processed through [facility]- copay amounts may vary at other pharmacies due to pharmacy/plan contracts, or as the patient moves through the different stages of their insurance plan.   PA #/Case ID/Reference #: [id number]

## 2023-11-09 ENCOUNTER — Inpatient Hospital Stay: Attending: Obstetrics and Gynecology | Admitting: Oncology

## 2023-11-09 ENCOUNTER — Encounter: Payer: Self-pay | Admitting: Oncology

## 2023-11-09 VITALS — BP 121/67 | HR 97 | Temp 97.6°F | Resp 18 | Wt 356.5 lb

## 2023-11-09 DIAGNOSIS — Z90710 Acquired absence of both cervix and uterus: Secondary | ICD-10-CM | POA: Diagnosis not present

## 2023-11-09 DIAGNOSIS — C541 Malignant neoplasm of endometrium: Secondary | ICD-10-CM | POA: Diagnosis not present

## 2023-11-09 DIAGNOSIS — Z9221 Personal history of antineoplastic chemotherapy: Secondary | ICD-10-CM | POA: Insufficient documentation

## 2023-11-09 DIAGNOSIS — Z8542 Personal history of malignant neoplasm of other parts of uterus: Secondary | ICD-10-CM | POA: Insufficient documentation

## 2023-11-09 DIAGNOSIS — Z90722 Acquired absence of ovaries, bilateral: Secondary | ICD-10-CM | POA: Insufficient documentation

## 2023-11-09 DIAGNOSIS — Z08 Encounter for follow-up examination after completed treatment for malignant neoplasm: Secondary | ICD-10-CM | POA: Insufficient documentation

## 2023-11-09 DIAGNOSIS — Z9079 Acquired absence of other genital organ(s): Secondary | ICD-10-CM | POA: Insufficient documentation

## 2023-11-09 MED ORDER — OXYCODONE HCL 5 MG PO TABS: ORAL_TABLET | ORAL | 0 refills | Status: DC

## 2023-11-09 NOTE — Progress Notes (Signed)
 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 Janit for irregular menstrual bleeding.  He has had an IUD in place and initially it was attribute it to use of IUD and was tried to be controlled with progesterone agents.  However due to worsening abdominal pain patient underwent a CT abdomen and pelvis with contrast on 12/25/2021 which showed 8 mm   Posterior lung base mass.  Retroperitoneal adenopathy measuring 2.6 cm additional retrocrural adenopathy.  Faint lucent lesion involving L2 vertebral body.  The uterus is anteverted and enlarged.  Endometrium is enlarged heterogeneous and irregular extending to the fundal myometrium.  Findings consistent with endometrial malignancy.  CT chest without contrast showed a 10 mm right lower lobe subpleural nodule.   Patient had endometrial biopsy which was consistent FIGO grade 2 endometrioid endometrial carcinoma.  Patient was seen by GYN oncology and hopes to take her for surgery if she has good response to neoadjuvant chemotherapy.MSI unstable.  BRAF negative.  Loss of major and minor MMR proteins MLH1 and PMS2 less than 5% of tumor expression.  MLH1 hyper methylation present.  This is most likely due to somatic epigenetic modification which can be seen in about 77% of sporadic endometrial cancers.   Patient completed 6 cycles of CarboTaxol Keytruda  chemotherapy and is currently on maintenance Keytruda .   PET CT scan on 05/19/2022 showed enlarged uterus with hypermetabolic endometrial thickening.  Improving left periaortic nodal metastases.  No definitive bone metastases was noted.  Plan is to proceed with cytoreductive surgery at [facility].  Patient underwent hysterectomy and bilateral salpingo-oophorectomy on 06/10/2022.  Final pathology showed residual endometrioid adenocarcinoma of the endometrium FIGO grade 2 with marked treatment effect invading 18 mm and a 34 mm thick myometrium.  Fallopian tubes and ovaries were negative for malignancy.  Pelvic washings were negative for malignancy.   Patient completed 14 maintenance cycles of Keytruda  on 10/14/2023  Interval history-patient has chronic low back pain unrelated to her malignancy secondary from osteoarthritis for which she uses oxycodone  once or twice a week.  She is trying to lose weight.  Denies any specific complaints other than that today  ECOG PS- 1 Pain scale- 0   Review of systems- Review of Systems  Constitutional:  Negative for chills, fever, malaise/fatigue and weight loss.  HENT:  Negative for congestion, ear discharge and nosebleeds.   Eyes:  Negative for blurred vision.  Respiratory:  Negative for cough, hemoptysis, sputum production, shortness of breath and wheezing.   Cardiovascular:  Negative for chest pain, palpitations, orthopnea and claudication.  Gastrointestinal:  Negative for abdominal pain, blood in stool, constipation, diarrhea, heartburn, melena, nausea and vomiting.  Genitourinary:  Negative for dysuria, flank pain, frequency, hematuria and urgency.  Musculoskeletal:  Positive for back pain. Negative for joint pain and myalgias.  Skin:  Negative for rash.  Neurological:  Negative for dizziness, tingling, focal weakness, seizures, weakness and headaches.  Endo/Heme/Allergies:  Does not bruise/bleed easily.  Psychiatric/Behavioral:  Negative for depression and  suicidal ideas. The patient does not have insomnia.        Allergies  Allergen Reactions   Bee Pollen Nausea And Vomiting    Itchy, swelling, watery eyes, runny nose.   Pollen Extract Nausea And Vomiting    Itchy, swelling, watery eyes, runny nose.     Past Medical History:  Diagnosis Date   Anemia    Endometrial cancer (HCC)    Hypertension    Menorrhagia    Pulmonary embolism [facility])      Past Surgical History:  Procedure Laterality Date   ABDOMINAL HYSTERECTOMY Bilateral 06/10/2022   at [facility]   CESAREAN SECTION  02/25/1992   COLONOSCOPY WITH PROPOFOL  N/A 08/06/2021   Procedure: COLONOSCOPY WITH PROPOFOL ;  Surgeon: Therisa Bi, MD;  Location: [facility] ENDOSCOPY;  Service: Gastroenterology;  Laterality: N/A;   ESOPHAGOGASTRODUODENOSCOPY (EGD) WITH PROPOFOL  N/A 02/09/2020   Procedure: ESOPHAGOGASTRODUODENOSCOPY (EGD) WITH PROPOFOL ;  Surgeon: Therisa Bi, MD;  Location: [facility] ENDOSCOPY;  Service: Gastroenterology;  Laterality: N/A;   IR IMAGING GUIDED PORT INSERTION  01/17/2022   IR RADIOLOGIST EVAL & MGMT  04/28/2023   IR RADIOLOGIST EVAL & MGMT  05/05/2023    Social History   Socioeconomic History   Marital status: Media planner    Spouse name: Not on file   Number of children: Not on file   Years of education: Not on file   Highest education level: Not on file  Occupational History   Not on file  Tobacco Use   Smoking status: Never   Smokeless tobacco: Never  Vaping Use   Vaping status: Never Used  Substance and Sexual Activity   Alcohol use: No   Drug use: No   Sexual activity: Not Currently    Birth control/protection: I.U.D.    Comment: patient states MD said did not see in CT today 01/17/2022  Other Topics Concern   Not on file  Social History Narrative   Lives with [facility], partner, and no pets.   Social Drivers of Corporate investment banker Strain: Not on file  Food Insecurity: No Food Insecurity (10/14/2023)   Hunger Vital Sign    Worried About Running Out of Food in the Last Year: Never true    Ran Out  of Food in the Last Year: Never true  Transportation Needs: No Transportation Needs (10/14/2023)   PRAPARE - Administrator, Civil Service (Medical): No    Lack of Transportation (Non-Medical): No  Physical Activity: Not on file  Stress: Not on file  Social Connections: Not on file  Intimate Partner Violence: Not At Risk (10/14/2023)   Humiliation, Afraid, Rape, and Kick questionnaire    Fear of Current or Ex-Partner: No    Emotionally Abused: No    Physically Abused: No    Sexually Abused: No    Family History  Problem Relation Age of Onset   Hypertension Mother    Stroke Mother    Heart attack Mother    Prostate cancer Father    Hypertension Father    Cancer Sister    Cancer Sister    Cancer Brother    Hypertension Other      Current Outpatient Medications:    diclofenac  Sodium (VOLTAREN ) 1 % GEL, Apply 4 g topically 4 (four) times daily., Disp: 50 g, Rfl: 0   ELIQUIS  2.5 MG TABS tablet, TAKE 1 TABLET BY MOUTH TWICE A DAY, Disp: 60 tablet, Rfl: 3   lidocaine -prilocaine  (EMLA ) cream, Apply to port site as directed, Disp:  30 g, Rfl: 3   LORazepam  (ATIVAN ) 0.5 MG tablet, Take 1 tablet (0.5 mg total) by mouth every 8 (eight) hours., Disp: 30 tablet, Rfl: 0   magnesium  hydroxide (MILK OF MAGNESIA) 400 MG/5ML suspension, Take 15 mLs by mouth daily as needed for moderate constipation. Take if no bowel movement for 2 days., Disp: , Rfl:    nystatin  (MYCOSTATIN /NYSTOP ) powder, Apply 1 Application topically 3 (three) times daily., Disp: 30 g, Rfl: 3   polyethylene glycol (MIRALAX ) 17 g packet, Take 17 g by mouth daily. To prevent constipation, Disp: 30 each, Rfl: 3   pregabalin  (LYRICA ) 75 MG capsule, Take 1 capsule (75 mg total) by mouth 2 (two) times daily., Disp: 60 capsule, Rfl: 0   semaglutide -weight management (WEGOVY ) 0.25 MG/0.5ML SOAJ SQ injection, Inject 0.25 mg into the skin once a week., Disp: 2 mL, Rfl: 1   oxyCODONE  (OXY IR/ROXICODONE ) 5 MG immediate release  tablet, Take one tablet daily as needed for pain., Disp: 60 tablet, Rfl: 0  Physical exam:  Vitals:   11/09/23 1421  BP: 121/67  Pulse: 97  Resp: 18  Temp: 97.6 F (36.4 C)  SpO2: 98%  Weight: (!) 356 lb 8 oz (161.7 kg)   Physical Exam Cardiovascular:     Rate and Rhythm: Normal rate and regular rhythm.     Heart sounds: Normal heart sounds.  Pulmonary:     Effort: Pulmonary effort is normal.     Breath sounds: Normal breath sounds.  Abdominal:     General: Bowel sounds are normal.     Palpations: Abdomen is soft.  Skin:    General: Skin is warm and dry.  Neurological:     Mental Status: She is alert and oriented to person, place, and time.      I have personally reviewed labs listed below:    Latest Ref Rng & Units 10/14/2023    8:24 AM  CMP  Glucose 70 - 99 mg/dL 99   BUN 6 - 20 mg/dL 14   Creatinine 9.55 - 1.00 mg/dL 9.19   Sodium 864 - 854 mmol/L 135   Potassium 3.5 - 5.1 mmol/L 3.7   Chloride 98 - 111 mmol/L 101   CO2 22 - 32 mmol/L 23   Calcium 8.9 - 10.3 mg/dL 8.7   Total Protein 6.5 - 8.1 g/dL 7.8   Total Bilirubin 0.0 - 1.2 mg/dL 0.8   Alkaline Phos 38 - 126 U/L 73   AST 15 - 41 U/L 20   ALT 0 - 44 U/L 12       Latest Ref Rng & Units 10/14/2023    8:24 AM  CBC  WBC 4.0 - 10.5 K/uL 6.9   Hemoglobin 12.0 - 15.0 g/dL 87.4   Hematocrit 63.9 - 46.0 % 35.4   Platelets 150 - 400 K/uL 305    I have personally reviewed Radiology images listed below: No images are attached to the encounter.  NM PET Image Restag (PS) Skull Base To Thigh Result Date: 10/31/2023 CLINICAL DATA:  Subsequent treatment strategy for endometrial cancer. EXAM: NUCLEAR MEDICINE PET SKULL BASE TO THIGH TECHNIQUE: 12.8 mCi F-18 FDG was injected intravenously. Full-ring PET imaging was performed from the skull base to thigh after the radiotracer. CT data was obtained and used for attenuation correction and anatomic localization. Fasting blood glucose: 98 mg/dl COMPARISON:  CT chest abdomen  pelvis 08/26/2023 and PET 09/09/2022. FINDINGS: Mediastinal blood pool activity: SUV max 3.8 Liver activity: SUV max NA NECK: Misregistration  artifact.  No abnormal hypermetabolism. Incidental CT findings: None. CHEST: Mild focal distal esophageal uptake without a CT correlate. No abnormal hypermetabolism. Incidental CT findings: Right IJ Port-A-Cath terminates in the right atrium. Atherosclerotic calcification of the aorta. Heart is enlarged. No pericardial or pleural effusion. ABDOMEN/PELVIS: Amorphous left periaortic soft tissue measures approximately 9 x 14 mm (6/91), SUV max 3.9, similar. Heterogeneous vaginal uptake, SUV max 7.4, nonspecific. No hypermetabolic lymph nodes. Incidental CT findings: None. SKELETON: No abnormal hypermetabolism. Incidental CT findings: Degenerative changes in the spine. IMPRESSION: 1. Minimal residual abdominal retroperitoneal left periaortic mildly hypermetabolic nodal soft tissue, stable. No evidence of disease progression. 2. Patchy vaginal hypermetabolism, nonspecific. 3.  Aortic atherosclerosis (ICD10-I70.0). Electronically Signed   By: Newell Eke M.D.   On: 10/31/2023 16:59     Assessment and plan- Patient is a 48 y.o. female with history of stage IVb endometrioid endometrial carcinoma.  She is here To discuss PET CT scan results and further management  I have reviewed PET CT scan images independently and discussed findings with the patient.  Patient was found to have MSI high stage IVb endometrioid endometrial cancer and completed 6 cycles of CarboTaxol Keytruda  chemotherapy as well as 14 maintenance cycles of Keytruda .  Her present PET scan shows 9 mm amorphus density in the left periaortic soft tissue with an SUV of 3.9 which appears stable as compared to her CT and PET scan a year ago.  Options at this time include wait and watch approach and no further Keytruda  versus considering palliative radiation to this amorphous density given the mild hypermetabolism  in this area.  Patient will be meeting Dr. Elby next week and I will discuss her case with her to decide about continuing versus holding Keytruda .  Low back pain: Patient was found to have possible pathologic fracture of L2 and was referred to interventional radiology Dr. Karalee in the past.  Clinically her pain however was more consistent with osteoarthritis from the L4-L5 region and therefore underwent facet injections which helped her for about 3 months.  She would like to see Dr. Karalee again and we will reach out to him.  She takes occasional oxycodone  for nonmalignant back pain and I am renewing her prescription today.  She understands that she needs to use it sparingly   Visit Diagnosis 1. Endometrial cancer [facility])      Dr. Annah Skene, MD, MPH [facility] [phone] 11/09/2023 4:00 PM

## 2023-11-10 ENCOUNTER — Encounter: Payer: Self-pay | Admitting: Family Medicine

## 2023-11-10 ENCOUNTER — Telehealth: Payer: Self-pay

## 2023-11-10 ENCOUNTER — Encounter: Payer: Self-pay | Admitting: Oncology

## 2023-11-10 MED ORDER — OXYCODONE HCL 5 MG PO TABS: ORAL_TABLET | ORAL | 0 refills | Status: AC

## 2023-11-10 NOTE — Addendum Note (Signed)
 Addended by: Jadarius Commons on: 11/10/2023 08:47 AM   Modules accepted: Orders

## 2023-11-10 NOTE — Telephone Encounter (Signed)
 Outbound call to pharmacy to cancel Oxycodone  script sent to [facility].  Spoke to [employer].  Who indicated that they do not stock it regularly.  It has been deleted from the [facility] location and verified it shows active at the [facility] location.

## 2023-11-10 NOTE — Telephone Encounter (Signed)
 Received message via secure chat Patient called to ask that the prescription you sent yesterday needs to be sent to the [facility] on [address]. Voicemail she did not name the drug. Outbound call and left detailed message to contact [facility] on [address] to transfer script; I attempted calling but the pharmacy isn't open at this time.

## 2023-11-10 NOTE — Telephone Encounter (Signed)
 Received another secure chat indicating she said the pharmacy contacted her to tell her that she needed to change the script to the above pharmacy.  Forwarding script to Dr. Melanee for signing.

## 2023-11-12 ENCOUNTER — Ambulatory Visit (INDEPENDENT_AMBULATORY_CARE_PROVIDER_SITE_OTHER): Admitting: Family Medicine

## 2023-11-12 ENCOUNTER — Encounter: Payer: Self-pay | Admitting: Family Medicine

## 2023-11-12 ENCOUNTER — Other Ambulatory Visit: Payer: Self-pay | Admitting: Oncology

## 2023-11-12 VITALS — BP 123/87 | HR 95 | Temp 98.7°F | Ht 65.0 in | Wt 354.3 lb

## 2023-11-12 DIAGNOSIS — Z6841 Body Mass Index (BMI) 40.0 and over, adult: Secondary | ICD-10-CM

## 2023-11-12 DIAGNOSIS — M25522 Pain in left elbow: Secondary | ICD-10-CM

## 2023-11-12 DIAGNOSIS — R7303 Prediabetes: Secondary | ICD-10-CM | POA: Diagnosis not present

## 2023-11-12 NOTE — Progress Notes (Signed)
   Established Patient Office Visit  Subjective   Patient ID: Jean Morris, female    DOB: Sep 23, 1975  Age: 48 y.o. MRN: [id number]  Chief Complaint  Patient presents with   Follow-up    Patient is here for a follow up on her weight, got Wegovy  Tuesday but took it yesterday.  Was waiting on approval and reports that she has been walking 20-30 mins at least once a day but sometimes does it twice a day.  Have stopped all junk food and had only 1 soda since last seen.  Cut back on eating fast food.   Discussed the use of AI scribe software for clinical note transcription with the patient, who gave verbal consent to proceed.  History of Present Illness   {History (Optional):23778}    11/12/2023    3:32 PM 10/15/2023    3:11 PM 10/14/2023    8:46 AM  Depression screen PHQ 2/9  Decreased Interest 2 3 0  Down, Depressed, Hopeless 0 1 0  PHQ - 2 Score 2 4 0  Altered sleeping 1 2   Tired, decreased energy 2 3   Change in appetite 1 2   Feeling bad or failure about yourself  0 1   Trouble concentrating 0 1   Moving slowly or fidgety/restless 0 0   Suicidal thoughts 0 0   PHQ-9 Score 6 13   Difficult doing work/chores Somewhat difficult Somewhat difficult        11/12/2023    3:32 PM 10/15/2023    3:13 PM  GAD 7 : Generalized Anxiety Score  Nervous, Anxious, on Edge 1 1  Control/stop worrying 1 2  Worry too much - different things 1 2  Trouble relaxing 1 1  Restless 0 0  Easily annoyed or irritable 1 1  Afraid - awful might happen 0 0  Total GAD 7 Score 5 7  Anxiety Difficulty Somewhat difficult Somewhat difficult     ROS  Negative unless indicated in HPI   Objective:     BP 123/87 (BP Location: Right Arm, Patient Position: Sitting, Cuff Size: Large)   Pulse 95   Temp 98.7 F (37.1 C) (Oral)   Ht 5' 5 (1.651 m)   Wt (!) 354 lb 4.8 oz (160.7 kg)   LMP 02/17/2022 (Approximate)   SpO2 99%   BMI 58.96 kg/m  {Vitals History (Optional):23777}  Physical Exam   No  results found for any visits on 11/12/23.  {Labs (Optional):23779}  The 10-year ASCVD risk score (Arnett DK, et al., 2019) is: 1.3%    Assessment & Plan:  There are no diagnoses linked to this encounter.   Assessment and Plan Assessment & Plan       No follow-ups on file.    Curtis DELENA Boom, FNP

## 2023-11-13 DIAGNOSIS — R7303 Prediabetes: Secondary | ICD-10-CM | POA: Insufficient documentation

## 2023-11-18 ENCOUNTER — Encounter: Payer: Self-pay | Admitting: Obstetrics and Gynecology

## 2023-11-18 ENCOUNTER — Inpatient Hospital Stay: Admitting: Obstetrics and Gynecology

## 2023-11-18 ENCOUNTER — Other Ambulatory Visit: Payer: Self-pay | Admitting: Oncology

## 2023-11-18 ENCOUNTER — Other Ambulatory Visit: Payer: Self-pay

## 2023-11-18 VITALS — BP 131/80 | HR 95 | Temp 98.6°F | Resp 19 | Wt 352.3 lb

## 2023-11-18 DIAGNOSIS — Z8542 Personal history of malignant neoplasm of other parts of uterus: Secondary | ICD-10-CM | POA: Diagnosis not present

## 2023-11-18 DIAGNOSIS — C541 Malignant neoplasm of endometrium: Secondary | ICD-10-CM

## 2023-11-18 DIAGNOSIS — Z08 Encounter for follow-up examination after completed treatment for malignant neoplasm: Secondary | ICD-10-CM

## 2023-11-18 DIAGNOSIS — Z9221 Personal history of antineoplastic chemotherapy: Secondary | ICD-10-CM | POA: Diagnosis not present

## 2023-11-18 DIAGNOSIS — M7712 Lateral epicondylitis, left elbow: Secondary | ICD-10-CM | POA: Diagnosis not present

## 2023-11-18 DIAGNOSIS — Z90722 Acquired absence of ovaries, bilateral: Secondary | ICD-10-CM | POA: Diagnosis not present

## 2023-11-18 DIAGNOSIS — Z90710 Acquired absence of both cervix and uterus: Secondary | ICD-10-CM | POA: Diagnosis not present

## 2023-11-18 DIAGNOSIS — Z9079 Acquired absence of other genital organ(s): Secondary | ICD-10-CM | POA: Diagnosis not present

## 2023-11-18 DIAGNOSIS — R948 Abnormal results of function studies of other organs and systems: Secondary | ICD-10-CM

## 2023-11-18 NOTE — Progress Notes (Signed)
 Patient was seen in procedure room 1 of the [facility] GYN ONC Department., where I assisted with pelvic exam. I, Rayfield Jasmine RN, was present as chaperone for the sensitive portion of the exam.

## 2023-11-18 NOTE — Progress Notes (Signed)
 Gynecologic Oncology Interval Visit   Referring Provider: Dr. Janit & Dr Babara  Chief Complaint: Endometrial adenocarcinoma, stage IVB  Subjective:  Jean Morris is a 48 y.o. female who is seen in consultation from Dr. Janit for irregular bleeding for many years, diagnosed with Stage IVb endometrioid endometrial cancer currently s/p 6 cycles of neoadjuvant carboplatin  with paclitaxel  and keytruda , TLH-BSO at [facility] with Dr Mancil on 06/10/22, followed by 13 maintenance keytruda  since Monie 2024, most recently on August 2025, who returns to clinic for follow up.   In interim, she had pet scan performed which showed:   10/28/23- PET Restaging FINDINGS: Mediastinal blood pool activity: SUV max 3.8 Liver activity: SUV max NA   NECK: Misregistration artifact.  No abnormal hypermetabolism. Incidental CT findings: None.   CHEST: Mild focal distal esophageal uptake without a CT correlate. No abnormal hypermetabolism. Incidental CT findings: Right IJ Port-A-Cath terminates in the right atrium. Atherosclerotic calcification of the aorta. Heart is enlarged. No pericardial or pleural effusion.   ABDOMEN/PELVIS: Amorphous left periaortic soft tissue measures approximately 9 x 14 mm (6/91), SUV max 3.9, similar. Heterogeneous vaginal uptake, SUV max 7.4, nonspecific. No hypermetabolic lymph nodes. Incidental CT findings: None.   SKELETON: No abnormal hypermetabolism. Incidental CT findings: Degenerative changes in the spine.   IMPRESSION: 1. Minimal residual abdominal retroperitoneal left periaortic mildly hypermetabolic nodal soft tissue, stable. No evidence of disease progression. 2. Patchy vaginal hypermetabolism, nonspecific. 3.  Aortic atherosclerosis (ICD10-I70.0).   PET showed 9 mm amorphus density in left periaortic soft tissue which is similar to previous imaging. She prevents for pelvic exam and discussion of stopping keytruda  vs palliative radiation to periaortic nodule. She has  ongoing back pain thought to originate from disc bulging of L4-L5 and she has had some blocks performed. She was found to have a compression fracture of L2 but she was not felt to be symptomatic of that area.      Gynecologic Oncology History:  Patient presented to her OB/GYN for irregular bleeding for several months.  She was thought to have an IUD in place and her bleeding was attempted to be controlled with progesterone agents.  Medical history significant for PE in [facility] in 02/2015  12/25/21- CT C/A/P-  for left lower quadrant abdominal pain showed enlarged uterus with thickened endometrium, heterogeneous and irregular extending to the fundal myometrium.  Retroperitoneal and retrocrural adenopathy.  Faint lucent lesion in left L2 vertebrae suspicious for metastasis.  Posterior right lower lobe subpleural nodule measuring 10 mm with surrounding groundglass opacity; suspicious for metastasis though was present on 08/2015 CT but measured 5 mm.   Pelvic ultrasound- Uterus-19 x 13.3 x 15 cm = volume 1974 mls.  Distinct endometrium not visible.  Heterogeneous area which may be surrounded by myometrium may be as much is 7 cm in greatest thickness. Endometrium-thickness, perhaps cystic is 75 mm.  Markedly heterogeneous with signs of internal flow. Right ovary-not visualized Left ovary-5.8 x 3.1 x 4.2 cm.  Simple dominant follicle measuring up to 3.5 cm accounts for much of the left ovarian dimension.  Pulse Doppler evaluation shows low resistance arterial and venous flow within the left ovary Other-small free fluid in the pelvis.  Endometrial biopsy- -endometrioid adenocarcinoma, FIGO grade 2  She was started on norethindrone  by Dr. Janit.   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 She was taking oral contraceptives at that time with increased dosing d/t heavy menstrual periods. Additionally, hx of iron  deficiency and has taken  oral iron . Given extent of disease and VTE  neoaduvant therapy was recommended.   Treatment Summary:  01/21/22- D1C1- carboplatin  02/12/22- D1C2 carbo-paclitaxel -pembrolizumab  03/05/22- D1C3 carbo-paclitaxel -pembrolizumab   03/17/22- CT Chest Abdomen Pelvis W Contrast-  IMPRESSION: 1. Persistent heterogeneous irregularity of the endometrium with uterine enlargement compatible with known endometrial malignancy.  2. New heterogeneity of the 2.8 cm left ovarian lesion, nonspecific but possibly reflecting metastatic disease. Consider further evaluation with pelvic ultrasound. 3. Overall decreased retrocrural and retroperitoneal adenopathy. However, there is interval increase in size and adjacent infiltrative/desmoplastic stranding adjacent to a centrally necrotic left periaortic lymph node/lymph node conglomerate. 4. Continued decrease in size of the posterior right lower lobe pulmonary nodule. 5. New pathologic compression deformity of the L2 vertebral body. 6. Diffuse hepatic steatosis. 7. Mild diffuse bronchial wall thickening with mosaic attenuation of the lungs, suggestive of small airways disease.  01/21/22 D1C1 Carbo-paclitaxel -keytruda  02/12/22 D1C2 Carbo-paclitaxel -keytruda  03/05/22 D1C3 Carbo-paclitaxel -keytruda  03/26/22 D1C4 Carbo-paclitaxel -keytruda  04/16/22 D1C5 Carbo-paclitaxel -keytruda  05/07/2022-  D1C6 carbo-paclitaxel -pembrolizumab . 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 Zometa  4 mg added.  05/19/2022  PET - enlarged uterus is hypermetabolic endometrial thickening consistent with known endometrial cancer. Improving left periaortic nodal metastases. Addtiional small cervical, axillary, and inguinal nodes reflective of nodal metastases.   05/28/22-  D1C7 - pembrolizumab  06/10/22-  TLH BSO at [facility] with Dr. Mancil.  Pathology: A.  Uterus, cervix, bilateral fallopian tubes and ovaries, hysterectomy, bilateral salpingo-oophorectomy: Residual endometrioid adenocarcinoma of the endometrium (0.6 cm), FIGO grade 2 of 3, with  marked treatment effect, invading 18 mm in a 34 mm thick myometrium. 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 Cervix: Negative for malignancy. Serosa: Negative malignancy. Right and left ovaries: Negative for malignancy. Right and left fallopian tubes: Negative malignancy. Microsatellite instability and mismatch repair protein immunohistochemistry: Not performed (obtained on prior specimen). P53 immunohistochemistry: Not performed A: Pelvic Washings: negative. No evidence of malignancy.   She continued on maintenance pembrolizumab   09/09/22- PET for restaging. The aortacaval node measures 8 mm and a S.U.V. max of 3.8 on 82/4 versus 7 mm and a S.U.V. max of 3.5 on the prior exam. While this node is very small the SUV is concerning.  Given the findings below it is reasonable to complete pembrolizumab  maintenance and reimage. Overall response to therapy is reassuring. If still positive on subsequent PET consider radiation therapy and then reassess.  01/14/2023 1. Vagina, biopsy, Cuff :  POLYPOID, GRANULATION TISSUE and NEGATIVE FOR MALIGNANCY   02/09/2023 CT C/A/P IMPRESSION: 1. Stable examination without evidence of new or progressive disease within the chest, abdomen, or pelvis. 2. Similar left periaortic amorphous soft tissue density. 3. Unchanged pathologic sclerotic compression deformity of the L2 vertebral body. 4. Unchanged size of the 1.4 cm right inguinal lymph node which was minimally metabolically active on prior examination, nonspecific possibly reactive. 5.  Aortic Atherosclerosis (ICD10-I70.0).  04/30/2023 MRI Lumbar Spine IMPRESSION: 1. Pathologic compression fracture at L2 with loss of height centrally of 40-50%, not progressive since the CT scan of December. Some persistent increased T2 signal in the central portion of the vertebral body. Cannot determine if this represents successfully treated tumor or if there is any viable residual tumor. There does not appear to  be any progression or any extraosseous extension however. 2. No second lesion visible elsewhere in the region. 3. Mild degenerative disc disease at L1-2, L2-3 and L3-4 without compressive stenosis. 4. Moderate bulging of the disc at L4-5 with mild facet osteoarthritis. No compressive stenosis. 5. Mild facet osteoarthritis at L5-S1 without  stenosis.   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  BRAF V600E not present. The tumor cells are NEGATIVE for Her2 (1+). Estrogen Receptor:       POSITIVE, 90%, MODERATE STAINING INTENSITY Progesterone Receptor:   POSITIVE, 80%, STRONG STAINING INTENSITY   Problem List: Patient Active Problem List   Diagnosis Date Noted   Prediabetes 11/13/2023   Low back pain 01/09/2023   Hypokalemia 04/01/2022   Endometrial cancer (HCC) 01/15/2022   Iron  deficiency anemia 01/15/2022   Morbid obesity with BMI of 60.0-69.9, adult (HCC) 04/18/2015   Abnormal uterine bleeding 04/18/2015   Anemia 04/18/2015   Pulmonary embolism (HCC) 03/11/2015    Past Medical History: Past Medical History:  Diagnosis Date   Anemia    Endometrial cancer (HCC)    Hypertension    Menorrhagia    Pulmonary embolism (HCC)     Past Surgical History: Past Surgical History:  Procedure Laterality Date   ABDOMINAL HYSTERECTOMY Bilateral 06/10/2022   at [facility]   CESAREAN SECTION  02/25/1992   COLONOSCOPY WITH PROPOFOL  N/A 08/06/2021   Procedure: COLONOSCOPY WITH PROPOFOL ;  Surgeon: Therisa Bi, MD;  Location: [facility] ENDOSCOPY;  Service: Gastroenterology;  Laterality: N/A;   ESOPHAGOGASTRODUODENOSCOPY (EGD) WITH PROPOFOL  N/A 02/09/2020   Procedure: ESOPHAGOGASTRODUODENOSCOPY (EGD) WITH PROPOFOL ;  Surgeon: Therisa Bi, MD;  Location: [facility] ENDOSCOPY;  Service: Gastroenterology;  Laterality: N/A;   IR IMAGING GUIDED PORT INSERTION  01/17/2022   IR RADIOLOGIST EVAL & MGMT  04/28/2023   IR RADIOLOGIST EVAL & MGMT  05/05/2023    Past Gynecologic History:   Menarche: age 4, last 5 days History of OCP/HRT use: yes Last pap: 03/14/21- ASCUS, HPV negative History of STDs: The patient denies history of sexually transmitted disease. Sexually active: yes; not currently  OB History:  OB History  Gravida Para Term Preterm AB Living  3 3 3   3   SAB IAB Ectopic Multiple Live Births      3    # Outcome Date GA Lbr Len/2nd Weight Sex Type Anes PTL Lv  3 Term     M CS-LTranv   LIV  2 Term     M Vag-Spont   LIV  1 Term     F Vag-Spont   LIV   Family History: Family History  Problem Relation Age of Onset   Hypertension Mother    Stroke Mother    Heart attack Mother    Prostate cancer Father    Hypertension Father    Cancer Sister    Cancer Sister    Cancer Brother    Hypertension Other    Social History: Social History   Socioeconomic History   Marital status: Media planner    Spouse name: Not on file   Number of children: Not on file   Years of education: Not on file   Highest education level: Not on file  Occupational History   Not on file  Tobacco Use   Smoking status: Never   Smokeless tobacco: Never  Vaping Use   Vaping status: Never Used  Substance and Sexual Activity   Alcohol use: No   Drug use: No   Sexual activity: Not Currently    Birth control/protection: I.U.D.    Comment: patient states MD said did not see in CT today 01/17/2022  Other Topics Concern   Not on file  Social History Narrative   Lives with [facility], partner, and no pets.   Social Drivers of Corporate investment banker  Strain: Not on file  Food Insecurity: No Food Insecurity (10/14/2023)   Hunger Vital Sign    Worried About Running Out of Food in the Last Year: Never true    Ran Out of Food in the Last Year: Never true  Transportation Needs: No Transportation Needs (10/14/2023)   PRAPARE - Administrator, Civil Service (Medical): No    Lack of Transportation (Non-Medical): No  Physical Activity: Not on file  Stress: Not on file   Social Connections: Not on file  Intimate Partner Violence: Not At Risk (10/14/2023)   Humiliation, Afraid, Rape, and Kick questionnaire    Fear of Current or Ex-Partner: No    Emotionally Abused: No    Physically Abused: No    Sexually Abused: No   Allergies: Allergies  Allergen Reactions   Bee Pollen Nausea And Vomiting    Itchy, swelling, watery eyes, runny nose.   Pollen Extract Nausea And Vomiting    Itchy, swelling, watery eyes, runny nose.   Current Medications: Current Outpatient Medications  Medication Sig Dispense Refill   clotrimazole -betamethasone  (LOTRISONE ) cream APPLY TO AFFECTED AREA TWICE A DAY     dexamethasone  (DECADRON ) 4 MG tablet TAKE 2 TABLETS BY MOUTH STARTING THE DAY AFTER CHEMOTHERAPY FOR 3 DAYS. TAKE WITH FOOD.     diclofenac  Sodium (VOLTAREN ) 1 % GEL Apply 4 g topically 4 (four) times daily. 50 g 0   doxycycline  (VIBRA -TABS) 100 MG tablet TAKE 1 TABLET BY MOUTH 2 TIMES DAILY FOR 7 DAYS.     ELIQUIS  2.5 MG TABS tablet TAKE 1 TABLET BY MOUTH TWICE A DAY 60 tablet 3   gabapentin  (NEURONTIN ) 100 MG capsule Take 3 capsules by mouth at bedtime.     LACTULOSE  PO TAKE 15-30 MLS (10-20 G TOTAL) BY MOUTH 2 (TWO) TIMES DAILY AS NEEDED (CONSTIPATION).     lidocaine -prilocaine  (EMLA ) cream Apply to port site as directed 30 g 3   LORazepam  (ATIVAN ) 0.5 MG tablet Take 1 tablet (0.5 mg total) by mouth every 8 (eight) hours. 30 tablet 0   magnesium  hydroxide (MILK OF MAGNESIA) 400 MG/5ML suspension Take 15 mLs by mouth daily as needed for moderate constipation. Take if no bowel movement for 2 days.     meloxicam  (MOBIC ) 15 MG tablet Take 1 tablet daily with meals     METHYLPREDNISOLONE , PAK, PO Take 1 dose pk by oral route.     nystatin  (MYCOSTATIN /NYSTOP ) powder Apply 1 Application topically 3 (three) times daily. 30 g 3   oxyCODONE  (OXY IR/ROXICODONE ) 5 MG immediate release tablet Take one tablet daily as needed for pain. 60 tablet 0   polyethylene glycol (MIRALAX ) 17 g  packet Take 17 g by mouth daily. To prevent constipation 30 each 3   pregabalin  (LYRICA ) 75 MG capsule TAKE 1 CAPSULE BY MOUTH TWICE A DAY 60 capsule 0   semaglutide -weight management (WEGOVY ) 0.25 MG/0.5ML SOAJ SQ injection Inject 0.25 mg into the skin once a week. 2 mL 1   No current facility-administered medications for this visit.   Review of Systems General:  fatigue Skin: no complaints Eyes: no complaints HEENT: no complaints Breasts: no complaints Pulmonary: no complaints Cardiac: leg swelling Gastrointestinal: no complaints Genitourinary/Sexual: no complaints Ob/Gyn: no complaints Musculoskeletal: back pain Hematology: no complaints Neurologic/Psych: no complaints    Objective:  Physical Examination:  Vitals:   11/18/23 0913  BP: 131/80  Pulse: 95  Resp: 19  Temp: 98.6 F (37 C)  SpO2: 98%   Body mass index  is 58.63 kg/m.  GENERAL: Patient is a well appearing female in no acute distress HEENT:  Atraumatic and normocephalic. PERRL, neck supple. NODES:  No cervical, supraclavicular, axillary, or inguinal lymphadenopathy palpated.  LUNGS:  Normal respiratory effort ABDOMEN:  Soft, nontender. Nondistended. No masses/ascites/hernia/or hepatomegaly.  EXTREMITIES:  No peripheral edema.   SKIN: The umbilical redness has resolved completely.  Clear with no obvious rashes or skin changes o/t mild redness under the pannus. NEURO:  Nonfocal. Well oriented.  Appropriate affect.  Pelvic: Exam chaperoned by CMA EGBUS: no lesions Cervix: surgically absent Vagina: no lesions, no discharge or bleeding Uterus: surgically absent BME: no palpable masses Rectovaginal: deferred    IMAGING:  As per HPI    Assessment:  Shanessa L Bigler is a 48 y.o. female with PMB, anemia and large uterus diagnosed with figo grade 2 endometrial cancer on endometrial biopsy 11/23.  Stage IVB based on suspicious lung lesion, L2 vertebral lesion, significantly enlarged left aortic adenopathy  consistent with metastatic disease on imaging. Improved disease control s/p 6 cycles of chemotherapy with carbo/taxol  and keytruda .  PET/CT 2/34 showed enlarged uterus with hypermetabolic endometrial thickening and no other PET avid lesions.  In view of this, underwent TLH BSO 06/06/22 for disease control with excellent recovery.  Pathology showed grade 2 endometrioid cancer with 50% invasion, cervix, adnexa and washings negative.  PET scan 7/24 equivoval activity in retroperitoneal nodes. She has now completed 13 of planned 14 cycles of maintenance keytruda  per GY018.   10/28/23 PET showed stable but minimal residual abdominal retroperitoneal left periaortic soft tissue nodule, pathy vaginal hypermetabolism.    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  BRAF V600E not present. Her2 (1+). Estrogen Receptor: POSITIVE, 90%, MODERATE STAINING INTENSITY Progesterone Receptor: POSITIVE, 80%, STRONG STAINING INTENSITY  History of PE currently on Eliquis   Abdominal pain, uncertain etiology  Back Pain- moderate disc bulge of L4-L5 that she is symptomatic for. L2 compression fracture with height loss however, asymptomatic. Receiving injections which has been helpful.   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 Plan:   Problem List Items Addressed This Visit       Genitourinary   Endometrial cancer (HCC) - Primary   Relevant Medications   doxycycline  (VIBRA -TABS) 100 MG tablet   dexamethasone  (DECADRON ) 4 MG tablet   METHYLPREDNISOLONE , PAK, PO    Recommend continuing pembrolizumab  maintenance with Dr Melanee, her Medical Oncologist, for total of 14 cycles per GY018. Given persistent hypermetabolic left PA node, could consider evaluation with radiation oncology. Would not necessarily rate her entire pelvic but just local treatment.    If she develops progressive diease then it would be an option for pembrolizumab  + lenvatinib. In view of endometrioid histology and positive ER/PR expression then she could also be a candidate for hormonal regimen such as alternating megace and tamoxifen vs everolimus + letrozole. Clinical trials are also an option.   She enrolled on the [employer] consortium study.   We will plan to see her back in 3 months for continued close surveillance.   The patient's diagnosis, an outline of the further diagnostic and laboratory studies which will be required, the recommendation for surgery, and alternatives were discussed with her and her accompanying family members.  All questions were answered to their satisfaction.  Tinnie Dawn, DNP, AGNP-C, AOCNP [facility] [phone] (clinic)  I  personally had a face to face interaction and evaluated the patient jointly with the NP, Ms. Tinnie Dawn.  I have reviewed her history and available records and have performed the key portions of the physical exam including lymph node survey, abdominal exam, pelvic exam with my findings confirming those documented above by the APP.  I have discussed the case with the APP and the patient.  I agree with the above documentation, assessment and plan which was fully formulated by me.  Counseling was completed by me.   I personally saw the patient and performed a substantive portion of this encounter in conjunction with the listed APP as documented above.  Thoma Paulsen Isidor Constable, MD

## 2023-11-19 ENCOUNTER — Encounter: Payer: Self-pay | Admitting: Oncology

## 2023-11-19 ENCOUNTER — Other Ambulatory Visit: Payer: Self-pay

## 2023-11-25 ENCOUNTER — Encounter: Payer: Self-pay | Admitting: Oncology

## 2023-11-25 ENCOUNTER — Inpatient Hospital Stay (HOSPITAL_BASED_OUTPATIENT_CLINIC_OR_DEPARTMENT_OTHER): Admitting: Oncology

## 2023-11-25 ENCOUNTER — Ambulatory Visit
Admission: RE | Admit: 2023-11-25 | Discharge: 2023-11-25 | Disposition: A | Discharge status: Home / Self Care | Source: Ambulatory Visit | Attending: Radiation Oncology | Admitting: Radiation Oncology

## 2023-11-25 ENCOUNTER — Inpatient Hospital Stay: Attending: Obstetrics and Gynecology

## 2023-11-25 ENCOUNTER — Inpatient Hospital Stay

## 2023-11-25 VITALS — BP 121/71 | HR 86 | Temp 96.9°F | Resp 16 | Ht 65.0 in | Wt 353.0 lb

## 2023-11-25 VITALS — Wt 353.0 lb

## 2023-11-25 DIAGNOSIS — Z90710 Acquired absence of both cervix and uterus: Secondary | ICD-10-CM | POA: Insufficient documentation

## 2023-11-25 DIAGNOSIS — Z86711 Personal history of pulmonary embolism: Secondary | ICD-10-CM | POA: Insufficient documentation

## 2023-11-25 DIAGNOSIS — R918 Other nonspecific abnormal finding of lung field: Secondary | ICD-10-CM | POA: Diagnosis not present

## 2023-11-25 DIAGNOSIS — Z7901 Long term (current) use of anticoagulants: Secondary | ICD-10-CM | POA: Insufficient documentation

## 2023-11-25 DIAGNOSIS — Z79899 Other long term (current) drug therapy: Secondary | ICD-10-CM | POA: Insufficient documentation

## 2023-11-25 DIAGNOSIS — Z809 Family history of malignant neoplasm, unspecified: Secondary | ICD-10-CM | POA: Insufficient documentation

## 2023-11-25 DIAGNOSIS — Z170 Estrogen receptor positive status [ER+]: Secondary | ICD-10-CM | POA: Insufficient documentation

## 2023-11-25 DIAGNOSIS — C541 Malignant neoplasm of endometrium: Secondary | ICD-10-CM

## 2023-11-25 DIAGNOSIS — M545 Low back pain, unspecified: Secondary | ICD-10-CM | POA: Diagnosis not present

## 2023-11-25 DIAGNOSIS — Z510 Encounter for antineoplastic radiation therapy: Secondary | ICD-10-CM | POA: Insufficient documentation

## 2023-11-25 DIAGNOSIS — Z1732 Human epidermal growth factor receptor 2 negative status: Secondary | ICD-10-CM | POA: Diagnosis not present

## 2023-11-25 DIAGNOSIS — I10 Essential (primary) hypertension: Secondary | ICD-10-CM | POA: Insufficient documentation

## 2023-11-25 DIAGNOSIS — N920 Excessive and frequent menstruation with regular cycle: Secondary | ICD-10-CM | POA: Diagnosis not present

## 2023-11-25 DIAGNOSIS — Z8042 Family history of malignant neoplasm of prostate: Secondary | ICD-10-CM | POA: Diagnosis not present

## 2023-11-25 DIAGNOSIS — Z5112 Encounter for antineoplastic immunotherapy: Secondary | ICD-10-CM | POA: Insufficient documentation

## 2023-11-25 DIAGNOSIS — G8929 Other chronic pain: Secondary | ICD-10-CM | POA: Insufficient documentation

## 2023-11-25 DIAGNOSIS — Z1721 Progesterone receptor positive status: Secondary | ICD-10-CM | POA: Insufficient documentation

## 2023-11-25 DIAGNOSIS — I700 Atherosclerosis of aorta: Secondary | ICD-10-CM | POA: Diagnosis not present

## 2023-11-25 DIAGNOSIS — C771 Secondary and unspecified malignant neoplasm of intrathoracic lymph nodes: Secondary | ICD-10-CM | POA: Insufficient documentation

## 2023-11-25 DIAGNOSIS — Z90722 Acquired absence of ovaries, bilateral: Secondary | ICD-10-CM | POA: Diagnosis not present

## 2023-11-25 DIAGNOSIS — Z791 Long term (current) use of non-steroidal anti-inflammatories (NSAID): Secondary | ICD-10-CM | POA: Insufficient documentation

## 2023-11-25 DIAGNOSIS — Z86718 Personal history of other venous thrombosis and embolism: Secondary | ICD-10-CM | POA: Diagnosis not present

## 2023-11-25 DIAGNOSIS — Z9221 Personal history of antineoplastic chemotherapy: Secondary | ICD-10-CM | POA: Diagnosis not present

## 2023-11-25 LAB — COMPREHENSIVE METABOLIC PANEL WITH GFR
ALT: 15 U/L (ref 0–44)
AST: 24 U/L (ref 15–41)
Albumin: 3.7 g/dL (ref 3.5–5.0)
Alkaline Phosphatase: 65 U/L (ref 38–126)
Anion gap: 7 (ref 5–15)
BUN: 12 mg/dL (ref 6–20)
CO2: 23 mmol/L (ref 22–32)
Calcium: 8.8 mg/dL — ABNORMAL LOW (ref 8.9–10.3)
Chloride: 104 mmol/L (ref 98–111)
Creatinine, Ser: 0.93 mg/dL (ref 0.44–1.00)
GFR, Estimated: >60 mL/min (ref >60)
Glucose, Bld: 114 mg/dL — ABNORMAL HIGH (ref 70–99)
Potassium: 3.5 mmol/L (ref 3.5–5.1)
Sodium: 134 mmol/L — ABNORMAL LOW (ref 135–145)
Total Bilirubin: 0.7 mg/dL (ref 0.0–1.2)
Total Protein: 7.6 g/dL (ref 6.5–8.1)

## 2023-11-25 LAB — CBC WITH DIFFERENTIAL/PLATELET
Abs Immature Granulocytes: 0.03 K/uL (ref 0.00–0.07)
Basophils Absolute: 0 K/uL (ref 0.0–0.1)
Basophils Relative: 1 %
Eosinophils Absolute: 0.4 K/uL (ref 0.0–0.5)
Eosinophils Relative: 6 %
HCT: 35.7 % — ABNORMAL LOW (ref 36.0–46.0)
Hemoglobin: 12.8 g/dL (ref 12.0–15.0)
Immature Granulocytes: 1 %
Lymphocytes Relative: 31 %
Lymphs Abs: 1.9 K/uL (ref 0.7–4.0)
MCH: 26.3 pg (ref 26.0–34.0)
MCHC: 35.9 g/dL (ref 30.0–36.0)
MCV: 73.5 fL — ABNORMAL LOW (ref 80.0–100.0)
Monocytes Absolute: 0.3 K/uL (ref 0.1–1.0)
Monocytes Relative: 5 %
Neutro Abs: 3.5 K/uL (ref 1.7–7.7)
Neutrophils Relative %: 56 %
Platelets: 288 K/uL (ref 150–400)
RBC: 4.86 MIL/uL (ref 3.87–5.11)
RDW: 15.9 % — ABNORMAL HIGH (ref 11.5–15.5)
WBC: 6 K/uL (ref 4.0–10.5)
nRBC: 0 % (ref 0.0–0.2)

## 2023-11-25 MED ORDER — SODIUM CHLORIDE 0.9 % IV SOLN
Freq: Once | INTRAVENOUS | Status: AC
  Filled 2023-11-25: qty 250

## 2023-11-25 MED ORDER — DEXAMETHASONE SODIUM PHOSPHATE 10 MG/ML IJ SOLN
10.0000 mg | Freq: Once | INTRAMUSCULAR | Status: AC
  Administered 2023-11-25: 10 mg via INTRAVENOUS
  Filled 2023-11-25: qty 1

## 2023-11-25 MED ORDER — SODIUM CHLORIDE 0.9 % IV SOLN
400.0000 mg | Freq: Once | INTRAVENOUS | Status: AC
  Administered 2023-11-25: 400 mg via INTRAVENOUS
  Filled 2023-11-25: qty 16

## 2023-11-25 NOTE — Progress Notes (Signed)
 Hematology/Oncology Consult note [facility]  [phone] [phone]  Patient Care Team: Wellington Curtis LABOR, FNP as PCP - General (Family Medicine) Maurie Rayfield BIRCH, RN as Oncology Nurse Navigator Melanee Annah BROCKS, MD as Medical Oncologist (Oncology)   Name of the patient: Jean Morris  [id number]  Dec 31, 1975   Date of visit: 11/25/23  Diagnosis-  metastatic stage IVb endometrioid endometrial cancer      Chief complaint/ Reason for visit-on treatment assessment prior to cycle 14 of adjuvant Keytruda   Heme/Onc history: Patient is a 48 year old female who has seen Dr. Janit for irregular menstrual bleeding.  He has had an IUD in place and initially it was attribute it to use of IUD and was tried to be controlled with progesterone agents.  However due to worsening abdominal pain patient underwent a CT abdomen and pelvis with contrast on 12/25/2021 which showed 8 mm   Posterior lung base mass.  Retroperitoneal adenopathy measuring 2.6 cm additional retrocrural adenopathy.  Faint lucent lesion involving L2 vertebral body.  The uterus is anteverted and enlarged.  Endometrium is enlarged heterogeneous and irregular extending to the fundal myometrium.  Findings consistent with endometrial malignancy.  CT chest without contrast showed a 10 mm right lower lobe subpleural nodule.   Patient had endometrial biopsy which was consistent FIGO grade 2 endometrioid endometrial carcinoma.  Patient was seen by GYN oncology and hopes to take her for surgery if she has good response to neoadjuvant chemotherapy.MSI unstable.  BRAF negative.  Loss of major and minor MMR proteins MLH1 and PMS2 less than 5% of tumor expression.  MLH1 hyper methylation present.  This is most likely due to somatic epigenetic modification which can be seen in about 77% of sporadic endometrial cancers.   Patient completed 6 cycles of CarboTaxol Keytruda  chemotherapy and is currently on  maintenance Keytruda .  PET CT scan on 05/19/2022 showed enlarged uterus with hypermetabolic endometrial thickening.  Improving left periaortic nodal metastases.  No definitive bone metastases was noted.  Plan is to proceed with cytoreductive surgery at [facility].  Patient underwent hysterectomy and bilateral salpingo-oophorectomy on 06/10/2022.  Final pathology showed residual endometrioid adenocarcinoma of the endometrium FIGO grade 2 with marked treatment effect invading 18 mm and a 34 mm thick myometrium.  Fallopian tubes and ovaries were negative for malignancy.  Pelvic washings were negative for malignancy.  Interval history-patient has mild chronic back pain for which she takes Tylenol  and occasional oxycodone .  Symptoms are overall stable.  ECOG PS- 0 Pain scale- 0   Review of systems- Review of Systems  Constitutional:  Negative for chills, fever, malaise/fatigue and weight loss.  HENT:  Negative for congestion, ear discharge and nosebleeds.   Eyes:  Negative for blurred vision.  Respiratory:  Negative for cough, hemoptysis, sputum production, shortness of breath and wheezing.   Cardiovascular:  Negative for chest pain, palpitations, orthopnea and claudication.  Gastrointestinal:  Negative for abdominal pain, blood in stool, constipation, diarrhea, heartburn, melena, nausea and vomiting.  Genitourinary:  Negative for dysuria, flank pain, frequency, hematuria and urgency.  Musculoskeletal:  Positive for back pain. Negative for joint pain and myalgias.  Skin:  Negative for rash.  Neurological:  Negative for dizziness, tingling, focal weakness, seizures, weakness and headaches.  Endo/Heme/Allergies:  Does not bruise/bleed easily.  Psychiatric/Behavioral:  Negative for depression and suicidal ideas. The patient does not have insomnia.       Allergies  Allergen Reactions   Bee Pollen Nausea And Vomiting  Itchy, swelling, watery eyes, runny nose.   Pollen Extract Nausea And Vomiting     Itchy, swelling, watery eyes, runny nose.     Past Medical History:  Diagnosis Date   Anemia    Endometrial cancer (HCC)    Hypertension    Menorrhagia    Pulmonary embolism [facility])      Past Surgical History:  Procedure Laterality Date   ABDOMINAL HYSTERECTOMY Bilateral 06/10/2022   at [facility]   CESAREAN SECTION  02/25/1992   COLONOSCOPY WITH PROPOFOL  N/A 08/06/2021   Procedure: COLONOSCOPY WITH PROPOFOL ;  Surgeon: Therisa Bi, MD;  Location: [facility] ENDOSCOPY;  Service: Gastroenterology;  Laterality: N/A;   ESOPHAGOGASTRODUODENOSCOPY (EGD) WITH PROPOFOL  N/A 02/09/2020   Procedure: ESOPHAGOGASTRODUODENOSCOPY (EGD) WITH PROPOFOL ;  Surgeon: Therisa Bi, MD;  Location: [facility] ENDOSCOPY;  Service: Gastroenterology;  Laterality: N/A;   IR IMAGING GUIDED PORT INSERTION  01/17/2022   IR RADIOLOGIST EVAL & MGMT  04/28/2023   IR RADIOLOGIST EVAL & MGMT  05/05/2023    Social History   Socioeconomic History   Marital status: Media planner    Spouse name: Not on file   Number of children: Not on file   Years of education: Not on file   Highest education level: Not on file  Occupational History   Not on file  Tobacco Use   Smoking status: Never   Smokeless tobacco: Never  Vaping Use   Vaping status: Never Used  Substance and Sexual Activity   Alcohol use: No   Drug use: No   Sexual activity: Not Currently    Birth control/protection: I.U.D.    Comment: patient states MD said did not see in CT today 01/17/2022  Other Topics Concern   Not on file  Social History Narrative   Lives with [facility], partner, and no pets.   Social Drivers of Corporate investment banker Strain: Not on file  Food Insecurity: No Food Insecurity (10/14/2023)   Hunger Vital Sign    Worried About Running Out of Food in the Last Year: Never true    Ran Out of Food in the Last Year: Never true  Transportation Needs: No Transportation Needs (10/14/2023)   PRAPARE - Administrator, Civil Service  (Medical): No    Lack of Transportation (Non-Medical): No  Physical Activity: Not on file  Stress: Not on file  Social Connections: Not on file  Intimate Partner Violence: Not At Risk (10/14/2023)   Humiliation, Afraid, Rape, and Kick questionnaire    Fear of Current or Ex-Partner: No    Emotionally Abused: No    Physically Abused: No    Sexually Abused: No    Family History  Problem Relation Age of Onset   Hypertension Mother    Stroke Mother    Heart attack Mother    Prostate cancer Father    Hypertension Father    Cancer Sister    Cancer Sister    Cancer Brother    Hypertension Other      Current Outpatient Medications:    clotrimazole -betamethasone  (LOTRISONE ) cream, APPLY TO AFFECTED AREA TWICE A DAY, Disp: , Rfl:    dexamethasone  (DECADRON ) 4 MG tablet, TAKE 2 TABLETS BY MOUTH STARTING THE DAY AFTER CHEMOTHERAPY FOR 3 DAYS. TAKE WITH FOOD., Disp: , Rfl:    diclofenac  Sodium (VOLTAREN ) 1 % GEL, Apply 4 g topically 4 (four) times daily., Disp: 50 g, Rfl: 0   ELIQUIS  2.5 MG TABS tablet, TAKE 1 TABLET BY MOUTH TWICE A DAY, Disp: 60  tablet, Rfl: 3   gabapentin  (NEURONTIN ) 100 MG capsule, Take 3 capsules by mouth at bedtime., Disp: , Rfl:    lidocaine -prilocaine  (EMLA ) cream, Apply to port site as directed, Disp: 30 g, Rfl: 3   LORazepam  (ATIVAN ) 0.5 MG tablet, Take 1 tablet (0.5 mg total) by mouth every 8 (eight) hours., Disp: 30 tablet, Rfl: 0   magnesium  hydroxide (MILK OF MAGNESIA) 400 MG/5ML suspension, Take 15 mLs by mouth daily as needed for moderate constipation. Take if no bowel movement for 2 days., Disp: , Rfl:    METHYLPREDNISOLONE , PAK, PO, Take 1 dose pk by oral route., Disp: , Rfl:    nystatin  (MYCOSTATIN /NYSTOP ) powder, Apply 1 Application topically 3 (three) times daily., Disp: 30 g, Rfl: 3   oxyCODONE  (OXY IR/ROXICODONE ) 5 MG immediate release tablet, Take one tablet daily as needed for pain., Disp: 60 tablet, Rfl: 0   polyethylene glycol (MIRALAX ) 17 g packet,  Take 17 g by mouth daily. To prevent constipation, Disp: 30 each, Rfl: 3   pregabalin  (LYRICA ) 75 MG capsule, TAKE 1 CAPSULE BY MOUTH TWICE A DAY, Disp: 60 capsule, Rfl: 0   semaglutide -weight management (WEGOVY ) 0.25 MG/0.5ML SOAJ SQ injection, Inject 0.25 mg into the skin once a week., Disp: 2 mL, Rfl: 1   doxycycline  (VIBRA -TABS) 100 MG tablet, TAKE 1 TABLET BY MOUTH 2 TIMES DAILY FOR 7 DAYS., Disp: , Rfl:    LACTULOSE  PO, TAKE 15-30 MLS (10-20 G TOTAL) BY MOUTH 2 (TWO) TIMES DAILY AS NEEDED (CONSTIPATION). (Patient not taking: Reported on 11/25/2023), Disp: , Rfl:    meloxicam  (MOBIC ) 15 MG tablet, Take 1 tablet daily with meals (Patient not taking: Reported on 11/25/2023), Disp: , Rfl:  No current facility-administered medications for this visit.  Facility-Administered Medications Ordered in Other Visits:    pembrolizumab  (KEYTRUDA ) 400 mg in sodium chloride  0.9 % 50 mL chemo infusion, 400 mg, Intravenous, Once, Melanee Annah BROCKS, MD, Last Rate: 132 mL/hr at 11/25/23 1222, 400 mg at 11/25/23 1222  Physical exam:  Vitals:   11/25/23 1041  BP: 121/71  Pulse: 86  Resp: 16  Temp: (!) 96.9 F (36.1 C)  TempSrc: Tympanic  SpO2: 99%  Weight: (!) 353 lb (160.1 kg)  Height: 5' 5 (1.651 m)   Physical Exam Cardiovascular:     Rate and Rhythm: Normal rate and regular rhythm.     Heart sounds: Normal heart sounds.  Pulmonary:     Effort: Pulmonary effort is normal.     Breath sounds: Normal breath sounds.  Skin:    General: Skin is warm and dry.  Neurological:     Mental Status: She is alert and oriented to person, place, and time.      I have personally reviewed labs listed below:    Latest Ref Rng & Units 11/25/2023   10:12 AM  CMP  Glucose 70 - 99 mg/dL 885   BUN 6 - 20 mg/dL 12   Creatinine 9.55 - 1.00 mg/dL 9.06   Sodium 864 - 854 mmol/L 134   Potassium 3.5 - 5.1 mmol/L 3.5   Chloride 98 - 111 mmol/L 104   CO2 22 - 32 mmol/L 23   Calcium 8.9 - 10.3 mg/dL 8.8   Total Protein  6.5 - 8.1 g/dL 7.6   Total Bilirubin 0.0 - 1.2 mg/dL 0.7   Alkaline Phos 38 - 126 U/L 65   AST 15 - 41 U/L 24   ALT 0 - 44 U/L 15  Latest Ref Rng & Units 11/25/2023   10:12 AM  CBC  WBC 4.0 - 10.5 K/uL 6.0   Hemoglobin 12.0 - 15.0 g/dL 87.1   Hematocrit 63.9 - 46.0 % 35.7   Platelets 150 - 400 K/uL 288    I have personally reviewed Radiology images listed below: No images are attached to the encounter.  NM PET Image Restag (PS) Skull Base To Thigh Result Date: 10/31/2023 CLINICAL DATA:  Subsequent treatment strategy for endometrial cancer. EXAM: NUCLEAR MEDICINE PET SKULL BASE TO THIGH TECHNIQUE: 12.8 mCi F-18 FDG was injected intravenously. Full-ring PET imaging was performed from the skull base to thigh after the radiotracer. CT data was obtained and used for attenuation correction and anatomic localization. Fasting blood glucose: 98 mg/dl COMPARISON:  CT chest abdomen pelvis 08/26/2023 and PET 09/09/2022. FINDINGS: Mediastinal blood pool activity: SUV max 3.8 Liver activity: SUV max NA NECK: Misregistration artifact.  No abnormal hypermetabolism. Incidental CT findings: None. CHEST: Mild focal distal esophageal uptake without a CT correlate. No abnormal hypermetabolism. Incidental CT findings: Right IJ Port-A-Cath terminates in the right atrium. Atherosclerotic calcification of the aorta. Heart is enlarged. No pericardial or pleural effusion. ABDOMEN/PELVIS: Amorphous left periaortic soft tissue measures approximately 9 x 14 mm (6/91), SUV max 3.9, similar. Heterogeneous vaginal uptake, SUV max 7.4, nonspecific. No hypermetabolic lymph nodes. Incidental CT findings: None. SKELETON: No abnormal hypermetabolism. Incidental CT findings: Degenerative changes in the spine. IMPRESSION: 1. Minimal residual abdominal retroperitoneal left periaortic mildly hypermetabolic nodal soft tissue, stable. No evidence of disease progression. 2. Patchy vaginal hypermetabolism, nonspecific. 3.  Aortic  atherosclerosis (ICD10-I70.0). Electronically Signed   By: Newell Eke M.D.   On: 10/31/2023 16:59     Assessment and plan- Patient is a 48 y.o. female with history of stage IVb endometrioid endometrial carcinoma.  She is here for on treatment assessment prior to cycle 14 of adjuvant Keytruda   Counts okay to proceed with cycle 14 of adjuvant Keytruda  which will be her last cycle today.  I have reviewed PET CT scan images independently and discussed findings with the patient which shows no evidence of progressive disease but she was found to have minimal residual abdominal retroperitoneal adenopathy for which she has met with radiation oncology and will be receiving palliative radiation to that area.  I plan to watch her disease off treatment at this time.  I will plan to get a repeat PET CT scan sometime in mid January and see her thereafter  Low back pain: Likely unrelated to her malignancy. Patient was noted to have a possible pathologic deformity of the L2 vertebral body but her pain appears to be lower down.  She was evaluated by interventional radiology as well and received facet injections which helped temporarily.  She is presently on Tylenol  arthritis and as needed oxycodone  which she will continue  Port flushes every 6 to 12 weeks   Visit Diagnosis 1. Endometrial cancer (HCC)   2. Encounter for antineoplastic immunotherapy      Dr. Annah Skene, MD, MPH [facility] [phone] 11/25/2023 12:28 PM

## 2023-11-25 NOTE — Consult Note (Signed)
 NEW PATIENT EVALUATION  Name: Jean Morris  MRN: [id number]  Date:   11/25/2023     DOB: 12-25-1975   This 48 y.o. female patient presents to the clinic for initial evaluation of stage IVb endometrial adenocarcinoma with residual disease in the periaortic region by PET/CT criteria.  REFERRING PHYSICIAN: Wellington Curtis LABOR, FNP  CHIEF COMPLAINT: No chief complaint on file.   DIAGNOSIS: The encounter diagnosis was Endometrial cancer (HCC).   PREVIOUS INVESTIGATIONS:  CT scans PET CT scans reviewed Clinical notes reviewed Pathology reports reviewed  HPI: Patient is a 48 year old female who presented with a long history of intermittent vaginal bleeding.  She was diagnosed with stage IVb endometrioid endometrial carcinoma.  Her initial pelvic ultrasound back in 23 showed a 19 x 13 x 15 cm uterus with endometrial thickness measuring approximately 7.5 cm.  Endometrial biopsy was positive for FIGO grade 2 endometrioid adenocarcinoma.  She was started onnorethindrone although developed a PE in January 2017.  She is morbidly obese had gone undergone multiple courses of CarboTaxol and Keytruda .  PET/CT in March 2024 showing large uterus hypermetabolic endometrial thickening consistent with known endometrial cancer.  She underwent a TLH BSO at [facility] showing residual endometrial adenocarcinoma the endometrium FIGO grade 2 of 3 with marked treatment effect invading 18 mm of 34 mm thick myometrium.  Pelvic washings were negative as well as ovaries fallopian tube she was continued on Pembro.  Recent PET 3C staging back in July 2024 showed aortocaval node measuring 8 mm with an SUV of 3.5.  She completed Pembro maintenance although the node was still positive on PET/CT.  Tumor is ER positive and PR positive negative for her 2.  She is now referred to ration collagen for consideration of treatment to the residual hypermetabolic PA node.  She is seen today for evaluation she is doing well.  She states her  abdominal pain is not significant.  She is having no significant lower urinary tract symptoms or abnormal bowel movements.  PLANNED TREATMENT REGIMEN: SBRT  PAST MEDICAL HISTORY:  has a past medical history of Anemia, Endometrial cancer (HCC), Hypertension, Menorrhagia, and Pulmonary embolism (HCC).    PAST SURGICAL HISTORY:  Past Surgical History:  Procedure Laterality Date   ABDOMINAL HYSTERECTOMY Bilateral 06/10/2022   at [facility]   CESAREAN SECTION  02/25/1992   COLONOSCOPY WITH PROPOFOL  N/A 08/06/2021   Procedure: COLONOSCOPY WITH PROPOFOL ;  Surgeon: Therisa Bi, MD;  Location: [facility] ENDOSCOPY;  Service: Gastroenterology;  Laterality: N/A;   ESOPHAGOGASTRODUODENOSCOPY (EGD) WITH PROPOFOL  N/A 02/09/2020   Procedure: ESOPHAGOGASTRODUODENOSCOPY (EGD) WITH PROPOFOL ;  Surgeon: Therisa Bi, MD;  Location: [facility] ENDOSCOPY;  Service: Gastroenterology;  Laterality: N/A;   IR IMAGING GUIDED PORT INSERTION  01/17/2022   IR RADIOLOGIST EVAL & MGMT  04/28/2023   IR RADIOLOGIST EVAL & MGMT  05/05/2023    FAMILY HISTORY: family history includes Cancer in her brother, sister, and sister; Heart attack in her mother; Hypertension in her father, mother, and another family member; Prostate cancer in her father; Stroke in her mother.  SOCIAL HISTORY:  reports that she has never smoked. She has never used smokeless tobacco. She reports that she does not drink alcohol and does not use drugs.  ALLERGIES: Bee pollen and Pollen extract  MEDICATIONS:  Current Outpatient Medications  Medication Sig Dispense Refill   clotrimazole -betamethasone  (LOTRISONE ) cream APPLY TO AFFECTED AREA TWICE A DAY     dexamethasone  (DECADRON ) 4 MG tablet TAKE 2 TABLETS BY MOUTH STARTING THE DAY AFTER CHEMOTHERAPY FOR  3 DAYS. TAKE WITH FOOD.     diclofenac  Sodium (VOLTAREN ) 1 % GEL Apply 4 g topically 4 (four) times daily. 50 g 0   doxycycline  (VIBRA -TABS) 100 MG tablet TAKE 1 TABLET BY MOUTH 2 TIMES DAILY FOR 7 DAYS.     ELIQUIS  2.5  MG TABS tablet TAKE 1 TABLET BY MOUTH TWICE A DAY 60 tablet 3   gabapentin  (NEURONTIN ) 100 MG capsule Take 3 capsules by mouth at bedtime.     LACTULOSE  PO TAKE 15-30 MLS (10-20 G TOTAL) BY MOUTH 2 (TWO) TIMES DAILY AS NEEDED (CONSTIPATION). (Patient not taking: Reported on 11/25/2023)     lidocaine -prilocaine  (EMLA ) cream Apply to port site as directed 30 g 3   LORazepam  (ATIVAN ) 0.5 MG tablet Take 1 tablet (0.5 mg total) by mouth every 8 (eight) hours. 30 tablet 0   magnesium  hydroxide (MILK OF MAGNESIA) 400 MG/5ML suspension Take 15 mLs by mouth daily as needed for moderate constipation. Take if no bowel movement for 2 days.     meloxicam  (MOBIC ) 15 MG tablet Take 1 tablet daily with meals (Patient not taking: Reported on 11/25/2023)     METHYLPREDNISOLONE , PAK, PO Take 1 dose pk by oral route.     nystatin  (MYCOSTATIN /NYSTOP ) powder Apply 1 Application topically 3 (three) times daily. 30 g 3   oxyCODONE  (OXY IR/ROXICODONE ) 5 MG immediate release tablet Take one tablet daily as needed for pain. 60 tablet 0   polyethylene glycol (MIRALAX ) 17 g packet Take 17 g by mouth daily. To prevent constipation 30 each 3   pregabalin  (LYRICA ) 75 MG capsule TAKE 1 CAPSULE BY MOUTH TWICE A DAY 60 capsule 0   semaglutide -weight management (WEGOVY ) 0.25 MG/0.5ML SOAJ SQ injection Inject 0.25 mg into the skin once a week. 2 mL 1   No current facility-administered medications for this encounter.    ECOG PERFORMANCE STATUS:  0 - Asymptomatic  REVIEW OF SYSTEMS: Patient denies any weight loss, fatigue, weakness, fever, chills or night sweats. Patient denies any loss of vision, blurred vision. Patient denies any ringing  of the ears or hearing loss. No irregular heartbeat. Patient denies heart murmur or history of fainting. Patient denies any chest pain or pain radiating to her upper extremities. Patient denies any shortness of breath, difficulty breathing at night, cough or hemoptysis. Patient denies any swelling in  the lower legs. Patient denies any nausea vomiting, vomiting of blood, or coffee ground material in the vomitus. Patient denies any stomach pain. Patient states has had normal bowel movements no significant constipation or diarrhea. Patient denies any dysuria, hematuria or significant nocturia. Patient denies any problems walking, swelling in the joints or loss of balance. Patient denies any skin changes, loss of hair or loss of weight. Patient denies any excessive worrying or anxiety or significant depression. Patient denies any problems with insomnia. Patient denies excessive thirst, polyuria, polydipsia. Patient denies any swollen glands, patient denies easy bruising or easy bleeding. Patient denies any recent infections, allergies or URI. Patient s visual fields have not changed significantly in recent time.   PHYSICAL EXAM: BP (P) 125/63 (BP Location: Left Arm)   Pulse (P) 97   Resp (P) 18   Wt (!) 353 lb (160.1 kg)   LMP 02/17/2022 (Approximate)   SpO2 (P) 98%   BMI 58.74 kg/m  Moderately obese female in NAD.  Well-developed well-nourished patient in NAD. HEENT reveals PERLA, EOMI, discs not visualized.  Oral cavity is clear. No oral mucosal lesions are identified. Neck is  clear without evidence of cervical or supraclavicular adenopathy. Lungs are clear to A&P. Cardiac examination is essentially unremarkable with regular rate and rhythm without murmur rub or thrill. Abdomen is benign with no organomegaly or masses noted. Motor sensory and DTR levels are equal and symmetric in the upper and lower extremities. Cranial nerves II through XII are grossly intact. Proprioception is intact. No peripheral adenopathy or edema is identified. No motor or sensory levels are noted. Crude visual fields are within normal range.  LABORATORY DATA: Pathology reports reviewed    RADIOLOGY RESULTS: PET CT scans reviewed as well as CT scans all compatible with above-stated findings   IMPRESSION: PET positive  residual disease in para aortic lymph node and patient with known stage IVb endometrial adenocarcinoma status post chemotherapy immunotherapy and TLH BSO in 48 year old female  PLAN: At this time I would plan SBRT to the area of residual hypermetabolic activity in the PA node.  Will plan on delivering 30 Gray in 5 fractions.  Risks and benefits of treatment occluding extremely low side effect profile possibly diarrhea possibly fatigue possibly slight skin reaction all were reviewed in detail with the patient.  She comprehends her treatment plan well.  I personally 7 ordered CT simulation for early next week.  I would like to take this opportunity to thank you for allowing me to participate in the care of your patient.SABRA Marcey Penton, MD

## 2023-11-25 NOTE — Progress Notes (Signed)
 Pt in for follow up, seen in rad onc this morning. Denies any concerns.

## 2023-11-26 DIAGNOSIS — C771 Secondary and unspecified malignant neoplasm of intrathoracic lymph nodes: Secondary | ICD-10-CM | POA: Diagnosis not present

## 2023-11-26 DIAGNOSIS — C541 Malignant neoplasm of endometrium: Secondary | ICD-10-CM | POA: Diagnosis not present

## 2023-11-27 ENCOUNTER — Other Ambulatory Visit: Payer: Self-pay

## 2023-11-30 ENCOUNTER — Encounter: Payer: Self-pay | Admitting: Family Medicine

## 2023-11-30 ENCOUNTER — Ambulatory Visit
Admission: RE | Admit: 2023-11-30 | Discharge: 2023-11-30 | Disposition: A | Source: Ambulatory Visit | Attending: Radiation Oncology | Admitting: Radiation Oncology

## 2023-11-30 ENCOUNTER — Other Ambulatory Visit: Payer: Self-pay | Admitting: Family Medicine

## 2023-11-30 DIAGNOSIS — R918 Other nonspecific abnormal finding of lung field: Secondary | ICD-10-CM | POA: Diagnosis not present

## 2023-11-30 DIAGNOSIS — I10 Essential (primary) hypertension: Secondary | ICD-10-CM | POA: Diagnosis not present

## 2023-11-30 DIAGNOSIS — Z90710 Acquired absence of both cervix and uterus: Secondary | ICD-10-CM | POA: Diagnosis not present

## 2023-11-30 DIAGNOSIS — Z5112 Encounter for antineoplastic immunotherapy: Secondary | ICD-10-CM | POA: Diagnosis not present

## 2023-11-30 DIAGNOSIS — Z809 Family history of malignant neoplasm, unspecified: Secondary | ICD-10-CM | POA: Diagnosis not present

## 2023-11-30 DIAGNOSIS — I700 Atherosclerosis of aorta: Secondary | ICD-10-CM | POA: Diagnosis not present

## 2023-11-30 DIAGNOSIS — M545 Low back pain, unspecified: Secondary | ICD-10-CM | POA: Diagnosis not present

## 2023-11-30 DIAGNOSIS — Z9221 Personal history of antineoplastic chemotherapy: Secondary | ICD-10-CM | POA: Diagnosis not present

## 2023-11-30 DIAGNOSIS — Z8042 Family history of malignant neoplasm of prostate: Secondary | ICD-10-CM | POA: Diagnosis not present

## 2023-11-30 DIAGNOSIS — Z79899 Other long term (current) drug therapy: Secondary | ICD-10-CM | POA: Diagnosis not present

## 2023-11-30 DIAGNOSIS — Z86711 Personal history of pulmonary embolism: Secondary | ICD-10-CM | POA: Diagnosis not present

## 2023-11-30 DIAGNOSIS — Z1732 Human epidermal growth factor receptor 2 negative status: Secondary | ICD-10-CM | POA: Diagnosis not present

## 2023-11-30 DIAGNOSIS — Z86718 Personal history of other venous thrombosis and embolism: Secondary | ICD-10-CM | POA: Diagnosis not present

## 2023-11-30 DIAGNOSIS — Z170 Estrogen receptor positive status [ER+]: Secondary | ICD-10-CM | POA: Diagnosis not present

## 2023-11-30 DIAGNOSIS — Z791 Long term (current) use of non-steroidal anti-inflammatories (NSAID): Secondary | ICD-10-CM | POA: Diagnosis not present

## 2023-11-30 DIAGNOSIS — C541 Malignant neoplasm of endometrium: Secondary | ICD-10-CM | POA: Diagnosis not present

## 2023-11-30 DIAGNOSIS — N920 Excessive and frequent menstruation with regular cycle: Secondary | ICD-10-CM | POA: Diagnosis not present

## 2023-11-30 DIAGNOSIS — C771 Secondary and unspecified malignant neoplasm of intrathoracic lymph nodes: Secondary | ICD-10-CM | POA: Diagnosis not present

## 2023-11-30 DIAGNOSIS — Z90722 Acquired absence of ovaries, bilateral: Secondary | ICD-10-CM | POA: Diagnosis not present

## 2023-11-30 DIAGNOSIS — Z7901 Long term (current) use of anticoagulants: Secondary | ICD-10-CM | POA: Diagnosis not present

## 2023-11-30 DIAGNOSIS — Z510 Encounter for antineoplastic radiation therapy: Secondary | ICD-10-CM | POA: Diagnosis not present

## 2023-11-30 DIAGNOSIS — G8929 Other chronic pain: Secondary | ICD-10-CM | POA: Diagnosis not present

## 2023-11-30 DIAGNOSIS — Z1721 Progesterone receptor positive status: Secondary | ICD-10-CM | POA: Diagnosis not present

## 2023-11-30 MED ORDER — WEGOVY 0.5 MG/0.5ML ~~LOC~~ SOAJ: 0.5000 mg | SUBCUTANEOUS | 1 refills | Status: DC

## 2023-12-02 ENCOUNTER — Telehealth: Payer: Self-pay | Admitting: *Deleted

## 2023-12-02 NOTE — Telephone Encounter (Signed)
 The case manager is wanting to see the last time that she had a visit with her current status and how she is doing and what is the plan of care future.  Did say that he just give the note from the last time that she has been seen and fax it would work to

## 2023-12-04 DIAGNOSIS — I700 Atherosclerosis of aorta: Secondary | ICD-10-CM | POA: Diagnosis not present

## 2023-12-04 DIAGNOSIS — Z510 Encounter for antineoplastic radiation therapy: Secondary | ICD-10-CM | POA: Diagnosis not present

## 2023-12-04 DIAGNOSIS — Z7901 Long term (current) use of anticoagulants: Secondary | ICD-10-CM | POA: Diagnosis not present

## 2023-12-04 DIAGNOSIS — Z809 Family history of malignant neoplasm, unspecified: Secondary | ICD-10-CM | POA: Diagnosis not present

## 2023-12-04 DIAGNOSIS — Z90710 Acquired absence of both cervix and uterus: Secondary | ICD-10-CM | POA: Diagnosis not present

## 2023-12-04 DIAGNOSIS — M545 Low back pain, unspecified: Secondary | ICD-10-CM | POA: Diagnosis not present

## 2023-12-04 DIAGNOSIS — G8929 Other chronic pain: Secondary | ICD-10-CM | POA: Diagnosis not present

## 2023-12-04 DIAGNOSIS — Z86718 Personal history of other venous thrombosis and embolism: Secondary | ICD-10-CM | POA: Diagnosis not present

## 2023-12-04 DIAGNOSIS — Z5112 Encounter for antineoplastic immunotherapy: Secondary | ICD-10-CM | POA: Diagnosis not present

## 2023-12-04 DIAGNOSIS — Z1721 Progesterone receptor positive status: Secondary | ICD-10-CM | POA: Diagnosis not present

## 2023-12-04 DIAGNOSIS — N920 Excessive and frequent menstruation with regular cycle: Secondary | ICD-10-CM | POA: Diagnosis not present

## 2023-12-04 DIAGNOSIS — Z9221 Personal history of antineoplastic chemotherapy: Secondary | ICD-10-CM | POA: Diagnosis not present

## 2023-12-04 DIAGNOSIS — C771 Secondary and unspecified malignant neoplasm of intrathoracic lymph nodes: Secondary | ICD-10-CM | POA: Diagnosis not present

## 2023-12-04 DIAGNOSIS — Z90722 Acquired absence of ovaries, bilateral: Secondary | ICD-10-CM | POA: Diagnosis not present

## 2023-12-04 DIAGNOSIS — Z1732 Human epidermal growth factor receptor 2 negative status: Secondary | ICD-10-CM | POA: Diagnosis not present

## 2023-12-04 DIAGNOSIS — R918 Other nonspecific abnormal finding of lung field: Secondary | ICD-10-CM | POA: Diagnosis not present

## 2023-12-04 DIAGNOSIS — Z86711 Personal history of pulmonary embolism: Secondary | ICD-10-CM | POA: Diagnosis not present

## 2023-12-04 DIAGNOSIS — Z791 Long term (current) use of non-steroidal anti-inflammatories (NSAID): Secondary | ICD-10-CM | POA: Diagnosis not present

## 2023-12-04 DIAGNOSIS — Z8042 Family history of malignant neoplasm of prostate: Secondary | ICD-10-CM | POA: Diagnosis not present

## 2023-12-04 DIAGNOSIS — I10 Essential (primary) hypertension: Secondary | ICD-10-CM | POA: Diagnosis not present

## 2023-12-04 DIAGNOSIS — C541 Malignant neoplasm of endometrium: Secondary | ICD-10-CM | POA: Diagnosis not present

## 2023-12-04 DIAGNOSIS — Z79899 Other long term (current) drug therapy: Secondary | ICD-10-CM | POA: Diagnosis not present

## 2023-12-04 DIAGNOSIS — Z170 Estrogen receptor positive status [ER+]: Secondary | ICD-10-CM | POA: Diagnosis not present

## 2023-12-10 ENCOUNTER — Ambulatory Visit

## 2023-12-10 ENCOUNTER — Other Ambulatory Visit: Payer: Self-pay

## 2023-12-10 ENCOUNTER — Ambulatory Visit
Admission: RE | Admit: 2023-12-10 | Discharge: 2023-12-10 | Disposition: A | Discharge status: Home / Self Care | Source: Ambulatory Visit | Attending: Radiation Oncology | Admitting: Radiation Oncology

## 2023-12-10 DIAGNOSIS — Z90722 Acquired absence of ovaries, bilateral: Secondary | ICD-10-CM | POA: Diagnosis not present

## 2023-12-10 DIAGNOSIS — Z510 Encounter for antineoplastic radiation therapy: Secondary | ICD-10-CM | POA: Diagnosis not present

## 2023-12-10 DIAGNOSIS — Z8042 Family history of malignant neoplasm of prostate: Secondary | ICD-10-CM | POA: Diagnosis not present

## 2023-12-10 DIAGNOSIS — Z170 Estrogen receptor positive status [ER+]: Secondary | ICD-10-CM | POA: Diagnosis not present

## 2023-12-10 DIAGNOSIS — M545 Low back pain, unspecified: Secondary | ICD-10-CM | POA: Diagnosis not present

## 2023-12-10 DIAGNOSIS — R918 Other nonspecific abnormal finding of lung field: Secondary | ICD-10-CM | POA: Diagnosis not present

## 2023-12-10 DIAGNOSIS — C771 Secondary and unspecified malignant neoplasm of intrathoracic lymph nodes: Secondary | ICD-10-CM | POA: Diagnosis not present

## 2023-12-10 DIAGNOSIS — Z1732 Human epidermal growth factor receptor 2 negative status: Secondary | ICD-10-CM | POA: Diagnosis not present

## 2023-12-10 DIAGNOSIS — Z86711 Personal history of pulmonary embolism: Secondary | ICD-10-CM | POA: Diagnosis not present

## 2023-12-10 DIAGNOSIS — Z1721 Progesterone receptor positive status: Secondary | ICD-10-CM | POA: Diagnosis not present

## 2023-12-10 DIAGNOSIS — Z7901 Long term (current) use of anticoagulants: Secondary | ICD-10-CM | POA: Diagnosis not present

## 2023-12-10 DIAGNOSIS — C541 Malignant neoplasm of endometrium: Secondary | ICD-10-CM | POA: Diagnosis not present

## 2023-12-10 DIAGNOSIS — Z809 Family history of malignant neoplasm, unspecified: Secondary | ICD-10-CM | POA: Diagnosis not present

## 2023-12-10 DIAGNOSIS — G8929 Other chronic pain: Secondary | ICD-10-CM | POA: Diagnosis not present

## 2023-12-10 DIAGNOSIS — Z9221 Personal history of antineoplastic chemotherapy: Secondary | ICD-10-CM | POA: Diagnosis not present

## 2023-12-10 DIAGNOSIS — Z86718 Personal history of other venous thrombosis and embolism: Secondary | ICD-10-CM | POA: Diagnosis not present

## 2023-12-10 DIAGNOSIS — Z90710 Acquired absence of both cervix and uterus: Secondary | ICD-10-CM | POA: Diagnosis not present

## 2023-12-10 DIAGNOSIS — N920 Excessive and frequent menstruation with regular cycle: Secondary | ICD-10-CM | POA: Diagnosis not present

## 2023-12-10 DIAGNOSIS — Z79899 Other long term (current) drug therapy: Secondary | ICD-10-CM | POA: Diagnosis not present

## 2023-12-10 DIAGNOSIS — I10 Essential (primary) hypertension: Secondary | ICD-10-CM | POA: Diagnosis not present

## 2023-12-10 DIAGNOSIS — I700 Atherosclerosis of aorta: Secondary | ICD-10-CM | POA: Diagnosis not present

## 2023-12-10 DIAGNOSIS — Z5112 Encounter for antineoplastic immunotherapy: Secondary | ICD-10-CM | POA: Diagnosis not present

## 2023-12-10 DIAGNOSIS — Z791 Long term (current) use of non-steroidal anti-inflammatories (NSAID): Secondary | ICD-10-CM | POA: Diagnosis not present

## 2023-12-10 LAB — RAD ONC ARIA SESSION SUMMARY
Course Elapsed Days: 0
Plan Fractions Treated to Date: 1
Plan Prescribed Dose Per Fraction: 3 Gy
Plan Total Fractions Prescribed: 10
Plan Total Prescribed Dose: 30 Gy
Reference Point Dosage Given to Date: 3 Gy
Reference Point Session Dosage Given: 3 Gy
Session Number: 1

## 2023-12-11 ENCOUNTER — Other Ambulatory Visit: Payer: Self-pay | Admitting: *Deleted

## 2023-12-11 ENCOUNTER — Ambulatory Visit
Admission: RE | Admit: 2023-12-11 | Discharge: 2023-12-11 | Disposition: A | Discharge status: Home / Self Care | Source: Ambulatory Visit | Attending: Radiation Oncology | Admitting: Radiation Oncology

## 2023-12-11 ENCOUNTER — Other Ambulatory Visit: Payer: Self-pay

## 2023-12-11 DIAGNOSIS — Z170 Estrogen receptor positive status [ER+]: Secondary | ICD-10-CM | POA: Diagnosis not present

## 2023-12-11 DIAGNOSIS — I10 Essential (primary) hypertension: Secondary | ICD-10-CM | POA: Diagnosis not present

## 2023-12-11 DIAGNOSIS — G8929 Other chronic pain: Secondary | ICD-10-CM | POA: Diagnosis not present

## 2023-12-11 DIAGNOSIS — I700 Atherosclerosis of aorta: Secondary | ICD-10-CM | POA: Diagnosis not present

## 2023-12-11 DIAGNOSIS — Z86711 Personal history of pulmonary embolism: Secondary | ICD-10-CM | POA: Diagnosis not present

## 2023-12-11 DIAGNOSIS — R918 Other nonspecific abnormal finding of lung field: Secondary | ICD-10-CM | POA: Diagnosis not present

## 2023-12-11 DIAGNOSIS — M545 Low back pain, unspecified: Secondary | ICD-10-CM | POA: Diagnosis not present

## 2023-12-11 DIAGNOSIS — N920 Excessive and frequent menstruation with regular cycle: Secondary | ICD-10-CM | POA: Diagnosis not present

## 2023-12-11 DIAGNOSIS — C541 Malignant neoplasm of endometrium: Secondary | ICD-10-CM | POA: Diagnosis not present

## 2023-12-11 DIAGNOSIS — Z5112 Encounter for antineoplastic immunotherapy: Secondary | ICD-10-CM | POA: Diagnosis not present

## 2023-12-11 DIAGNOSIS — Z510 Encounter for antineoplastic radiation therapy: Secondary | ICD-10-CM | POA: Diagnosis not present

## 2023-12-11 LAB — RAD ONC ARIA SESSION SUMMARY
Course Elapsed Days: 1
Plan Fractions Treated to Date: 2
Plan Prescribed Dose Per Fraction: 3 Gy
Plan Total Fractions Prescribed: 10
Plan Total Prescribed Dose: 30 Gy
Reference Point Dosage Given to Date: 6 Gy
Reference Point Session Dosage Given: 3 Gy
Session Number: 2

## 2023-12-11 MED ORDER — ONDANSETRON HCL 8 MG PO TABS: 8.0000 mg | ORAL_TABLET | Freq: Three times a day (TID) | ORAL | 0 refills | Status: AC | PRN

## 2023-12-14 ENCOUNTER — Ambulatory Visit

## 2023-12-14 ENCOUNTER — Ambulatory Visit
Admission: RE | Admit: 2023-12-14 | Discharge: 2023-12-14 | Disposition: A | Discharge status: Home / Self Care | Source: Ambulatory Visit | Attending: Radiation Oncology | Admitting: Radiation Oncology

## 2023-12-14 ENCOUNTER — Other Ambulatory Visit: Payer: Self-pay

## 2023-12-14 DIAGNOSIS — C541 Malignant neoplasm of endometrium: Secondary | ICD-10-CM | POA: Diagnosis not present

## 2023-12-14 DIAGNOSIS — Z510 Encounter for antineoplastic radiation therapy: Secondary | ICD-10-CM | POA: Diagnosis not present

## 2023-12-14 LAB — RAD ONC ARIA SESSION SUMMARY
Course Elapsed Days: 4
Plan Fractions Treated to Date: 3
Plan Prescribed Dose Per Fraction: 3 Gy
Plan Total Fractions Prescribed: 10
Plan Total Prescribed Dose: 30 Gy
Reference Point Dosage Given to Date: 9 Gy
Reference Point Session Dosage Given: 3 Gy
Session Number: 3

## 2023-12-15 ENCOUNTER — Ambulatory Visit
Admission: RE | Admit: 2023-12-15 | Discharge: 2023-12-15 | Disposition: A | Source: Ambulatory Visit | Attending: Radiation Oncology | Admitting: Radiation Oncology

## 2023-12-15 ENCOUNTER — Other Ambulatory Visit: Payer: Self-pay

## 2023-12-15 DIAGNOSIS — C771 Secondary and unspecified malignant neoplasm of intrathoracic lymph nodes: Secondary | ICD-10-CM | POA: Diagnosis not present

## 2023-12-15 DIAGNOSIS — N920 Excessive and frequent menstruation with regular cycle: Secondary | ICD-10-CM | POA: Diagnosis not present

## 2023-12-15 DIAGNOSIS — G8929 Other chronic pain: Secondary | ICD-10-CM | POA: Diagnosis not present

## 2023-12-15 DIAGNOSIS — M545 Low back pain, unspecified: Secondary | ICD-10-CM | POA: Diagnosis not present

## 2023-12-15 DIAGNOSIS — Z90722 Acquired absence of ovaries, bilateral: Secondary | ICD-10-CM | POA: Diagnosis not present

## 2023-12-15 DIAGNOSIS — Z809 Family history of malignant neoplasm, unspecified: Secondary | ICD-10-CM | POA: Diagnosis not present

## 2023-12-15 DIAGNOSIS — Z5112 Encounter for antineoplastic immunotherapy: Secondary | ICD-10-CM | POA: Diagnosis not present

## 2023-12-15 DIAGNOSIS — Z90710 Acquired absence of both cervix and uterus: Secondary | ICD-10-CM | POA: Diagnosis not present

## 2023-12-15 DIAGNOSIS — Z86718 Personal history of other venous thrombosis and embolism: Secondary | ICD-10-CM | POA: Diagnosis not present

## 2023-12-15 DIAGNOSIS — Z9221 Personal history of antineoplastic chemotherapy: Secondary | ICD-10-CM | POA: Diagnosis not present

## 2023-12-15 DIAGNOSIS — I700 Atherosclerosis of aorta: Secondary | ICD-10-CM | POA: Diagnosis not present

## 2023-12-15 DIAGNOSIS — Z791 Long term (current) use of non-steroidal anti-inflammatories (NSAID): Secondary | ICD-10-CM | POA: Diagnosis not present

## 2023-12-15 DIAGNOSIS — Z79899 Other long term (current) drug therapy: Secondary | ICD-10-CM | POA: Diagnosis not present

## 2023-12-15 DIAGNOSIS — I10 Essential (primary) hypertension: Secondary | ICD-10-CM | POA: Diagnosis not present

## 2023-12-15 DIAGNOSIS — C541 Malignant neoplasm of endometrium: Secondary | ICD-10-CM | POA: Diagnosis not present

## 2023-12-15 DIAGNOSIS — Z8042 Family history of malignant neoplasm of prostate: Secondary | ICD-10-CM | POA: Diagnosis not present

## 2023-12-15 DIAGNOSIS — R918 Other nonspecific abnormal finding of lung field: Secondary | ICD-10-CM | POA: Diagnosis not present

## 2023-12-15 DIAGNOSIS — Z7901 Long term (current) use of anticoagulants: Secondary | ICD-10-CM | POA: Diagnosis not present

## 2023-12-15 DIAGNOSIS — Z510 Encounter for antineoplastic radiation therapy: Secondary | ICD-10-CM | POA: Diagnosis not present

## 2023-12-15 DIAGNOSIS — Z86711 Personal history of pulmonary embolism: Secondary | ICD-10-CM | POA: Diagnosis not present

## 2023-12-15 DIAGNOSIS — Z170 Estrogen receptor positive status [ER+]: Secondary | ICD-10-CM | POA: Diagnosis not present

## 2023-12-15 DIAGNOSIS — Z1732 Human epidermal growth factor receptor 2 negative status: Secondary | ICD-10-CM | POA: Diagnosis not present

## 2023-12-15 DIAGNOSIS — Z1721 Progesterone receptor positive status: Secondary | ICD-10-CM | POA: Diagnosis not present

## 2023-12-15 LAB — RAD ONC ARIA SESSION SUMMARY
Course Elapsed Days: 5
Plan Fractions Treated to Date: 4
Plan Prescribed Dose Per Fraction: 3 Gy
Plan Total Fractions Prescribed: 10
Plan Total Prescribed Dose: 30 Gy
Reference Point Dosage Given to Date: 12 Gy
Reference Point Session Dosage Given: 3 Gy
Session Number: 4

## 2023-12-16 ENCOUNTER — Ambulatory Visit

## 2023-12-16 ENCOUNTER — Other Ambulatory Visit: Payer: Self-pay

## 2023-12-16 ENCOUNTER — Ambulatory Visit
Admission: RE | Admit: 2023-12-16 | Discharge: 2023-12-16 | Disposition: A | Discharge status: Home / Self Care | Source: Ambulatory Visit | Attending: Radiation Oncology | Admitting: Radiation Oncology

## 2023-12-16 DIAGNOSIS — C541 Malignant neoplasm of endometrium: Secondary | ICD-10-CM | POA: Diagnosis not present

## 2023-12-16 DIAGNOSIS — Z510 Encounter for antineoplastic radiation therapy: Secondary | ICD-10-CM | POA: Diagnosis not present

## 2023-12-16 DIAGNOSIS — C771 Secondary and unspecified malignant neoplasm of intrathoracic lymph nodes: Secondary | ICD-10-CM | POA: Diagnosis not present

## 2023-12-16 DIAGNOSIS — I700 Atherosclerosis of aorta: Secondary | ICD-10-CM | POA: Diagnosis not present

## 2023-12-16 LAB — RAD ONC ARIA SESSION SUMMARY
Course Elapsed Days: 6
Plan Fractions Treated to Date: 5
Plan Prescribed Dose Per Fraction: 3 Gy
Plan Total Fractions Prescribed: 10
Plan Total Prescribed Dose: 30 Gy
Reference Point Dosage Given to Date: 15 Gy
Reference Point Session Dosage Given: 3 Gy
Session Number: 5

## 2023-12-17 ENCOUNTER — Other Ambulatory Visit: Payer: Self-pay

## 2023-12-17 ENCOUNTER — Encounter: Payer: Self-pay | Admitting: Oncology

## 2023-12-17 ENCOUNTER — Ambulatory Visit
Admission: RE | Admit: 2023-12-17 | Discharge: 2023-12-17 | Disposition: A | Source: Ambulatory Visit | Attending: Radiation Oncology | Admitting: Radiation Oncology

## 2023-12-17 DIAGNOSIS — Z510 Encounter for antineoplastic radiation therapy: Secondary | ICD-10-CM | POA: Diagnosis not present

## 2023-12-17 DIAGNOSIS — C541 Malignant neoplasm of endometrium: Secondary | ICD-10-CM | POA: Diagnosis not present

## 2023-12-17 LAB — RAD ONC ARIA SESSION SUMMARY
Course Elapsed Days: 7
Plan Fractions Treated to Date: 6
Plan Prescribed Dose Per Fraction: 3 Gy
Plan Total Fractions Prescribed: 10
Plan Total Prescribed Dose: 30 Gy
Reference Point Dosage Given to Date: 18 Gy
Reference Point Session Dosage Given: 3 Gy
Session Number: 6

## 2023-12-18 ENCOUNTER — Other Ambulatory Visit: Payer: Self-pay

## 2023-12-18 ENCOUNTER — Ambulatory Visit
Admission: RE | Admit: 2023-12-18 | Discharge: 2023-12-18 | Disposition: A | Discharge status: Home / Self Care | Source: Ambulatory Visit | Attending: Radiation Oncology | Admitting: Radiation Oncology

## 2023-12-18 DIAGNOSIS — C541 Malignant neoplasm of endometrium: Secondary | ICD-10-CM | POA: Diagnosis not present

## 2023-12-18 DIAGNOSIS — Z510 Encounter for antineoplastic radiation therapy: Secondary | ICD-10-CM | POA: Diagnosis not present

## 2023-12-18 LAB — RAD ONC ARIA SESSION SUMMARY
Course Elapsed Days: 8
Plan Fractions Treated to Date: 7
Plan Prescribed Dose Per Fraction: 3 Gy
Plan Total Fractions Prescribed: 10
Plan Total Prescribed Dose: 30 Gy
Reference Point Dosage Given to Date: 21 Gy
Reference Point Session Dosage Given: 3 Gy
Session Number: 7

## 2023-12-21 ENCOUNTER — Ambulatory Visit

## 2023-12-21 ENCOUNTER — Ambulatory Visit
Admission: RE | Admit: 2023-12-21 | Discharge: 2023-12-21 | Disposition: A | Source: Ambulatory Visit | Attending: Radiation Oncology | Admitting: Radiation Oncology

## 2023-12-21 ENCOUNTER — Other Ambulatory Visit: Payer: Self-pay

## 2023-12-21 DIAGNOSIS — C771 Secondary and unspecified malignant neoplasm of intrathoracic lymph nodes: Secondary | ICD-10-CM | POA: Diagnosis not present

## 2023-12-21 DIAGNOSIS — Z8042 Family history of malignant neoplasm of prostate: Secondary | ICD-10-CM | POA: Diagnosis not present

## 2023-12-21 DIAGNOSIS — G8929 Other chronic pain: Secondary | ICD-10-CM | POA: Diagnosis not present

## 2023-12-21 DIAGNOSIS — C541 Malignant neoplasm of endometrium: Secondary | ICD-10-CM | POA: Diagnosis not present

## 2023-12-21 DIAGNOSIS — Z1721 Progesterone receptor positive status: Secondary | ICD-10-CM | POA: Diagnosis not present

## 2023-12-21 DIAGNOSIS — Z79899 Other long term (current) drug therapy: Secondary | ICD-10-CM | POA: Diagnosis not present

## 2023-12-21 DIAGNOSIS — Z170 Estrogen receptor positive status [ER+]: Secondary | ICD-10-CM | POA: Diagnosis not present

## 2023-12-21 DIAGNOSIS — Z90710 Acquired absence of both cervix and uterus: Secondary | ICD-10-CM | POA: Diagnosis not present

## 2023-12-21 DIAGNOSIS — M545 Low back pain, unspecified: Secondary | ICD-10-CM | POA: Diagnosis not present

## 2023-12-21 DIAGNOSIS — Z809 Family history of malignant neoplasm, unspecified: Secondary | ICD-10-CM | POA: Diagnosis not present

## 2023-12-21 DIAGNOSIS — Z86718 Personal history of other venous thrombosis and embolism: Secondary | ICD-10-CM | POA: Diagnosis not present

## 2023-12-21 DIAGNOSIS — Z1732 Human epidermal growth factor receptor 2 negative status: Secondary | ICD-10-CM | POA: Diagnosis not present

## 2023-12-21 DIAGNOSIS — Z90722 Acquired absence of ovaries, bilateral: Secondary | ICD-10-CM | POA: Diagnosis not present

## 2023-12-21 DIAGNOSIS — Z86711 Personal history of pulmonary embolism: Secondary | ICD-10-CM | POA: Diagnosis not present

## 2023-12-21 DIAGNOSIS — R918 Other nonspecific abnormal finding of lung field: Secondary | ICD-10-CM | POA: Diagnosis not present

## 2023-12-21 DIAGNOSIS — Z5112 Encounter for antineoplastic immunotherapy: Secondary | ICD-10-CM | POA: Diagnosis not present

## 2023-12-21 DIAGNOSIS — Z9221 Personal history of antineoplastic chemotherapy: Secondary | ICD-10-CM | POA: Diagnosis not present

## 2023-12-21 DIAGNOSIS — Z7901 Long term (current) use of anticoagulants: Secondary | ICD-10-CM | POA: Diagnosis not present

## 2023-12-21 DIAGNOSIS — I10 Essential (primary) hypertension: Secondary | ICD-10-CM | POA: Diagnosis not present

## 2023-12-21 DIAGNOSIS — Z510 Encounter for antineoplastic radiation therapy: Secondary | ICD-10-CM | POA: Diagnosis not present

## 2023-12-21 DIAGNOSIS — I700 Atherosclerosis of aorta: Secondary | ICD-10-CM | POA: Diagnosis not present

## 2023-12-21 DIAGNOSIS — Z791 Long term (current) use of non-steroidal anti-inflammatories (NSAID): Secondary | ICD-10-CM | POA: Diagnosis not present

## 2023-12-21 LAB — RAD ONC ARIA SESSION SUMMARY
Course Elapsed Days: 11
Plan Fractions Treated to Date: 8
Plan Prescribed Dose Per Fraction: 3 Gy
Plan Total Fractions Prescribed: 10
Plan Total Prescribed Dose: 30 Gy
Reference Point Dosage Given to Date: 24 Gy
Reference Point Session Dosage Given: 3 Gy
Session Number: 8

## 2023-12-22 ENCOUNTER — Other Ambulatory Visit: Payer: Self-pay

## 2023-12-22 ENCOUNTER — Ambulatory Visit
Admission: RE | Admit: 2023-12-22 | Discharge: 2023-12-22 | Disposition: A | Source: Ambulatory Visit | Attending: Radiation Oncology | Admitting: Radiation Oncology

## 2023-12-22 DIAGNOSIS — Z510 Encounter for antineoplastic radiation therapy: Secondary | ICD-10-CM | POA: Diagnosis not present

## 2023-12-22 DIAGNOSIS — C541 Malignant neoplasm of endometrium: Secondary | ICD-10-CM | POA: Diagnosis not present

## 2023-12-22 LAB — RAD ONC ARIA SESSION SUMMARY
Course Elapsed Days: 12
Plan Fractions Treated to Date: 9
Plan Prescribed Dose Per Fraction: 3 Gy
Plan Total Fractions Prescribed: 10
Plan Total Prescribed Dose: 30 Gy
Reference Point Dosage Given to Date: 27 Gy
Reference Point Session Dosage Given: 3 Gy
Session Number: 9

## 2023-12-23 ENCOUNTER — Other Ambulatory Visit: Payer: Self-pay

## 2023-12-23 ENCOUNTER — Ambulatory Visit

## 2023-12-23 ENCOUNTER — Ambulatory Visit
Admission: RE | Admit: 2023-12-23 | Discharge: 2023-12-23 | Disposition: A | Source: Ambulatory Visit | Attending: Radiation Oncology | Admitting: Radiation Oncology

## 2023-12-23 DIAGNOSIS — N920 Excessive and frequent menstruation with regular cycle: Secondary | ICD-10-CM | POA: Diagnosis not present

## 2023-12-23 DIAGNOSIS — Z791 Long term (current) use of non-steroidal anti-inflammatories (NSAID): Secondary | ICD-10-CM | POA: Diagnosis not present

## 2023-12-23 DIAGNOSIS — Z1732 Human epidermal growth factor receptor 2 negative status: Secondary | ICD-10-CM | POA: Diagnosis not present

## 2023-12-23 DIAGNOSIS — G8929 Other chronic pain: Secondary | ICD-10-CM | POA: Diagnosis not present

## 2023-12-23 DIAGNOSIS — Z90722 Acquired absence of ovaries, bilateral: Secondary | ICD-10-CM | POA: Diagnosis not present

## 2023-12-23 DIAGNOSIS — Z86711 Personal history of pulmonary embolism: Secondary | ICD-10-CM | POA: Diagnosis not present

## 2023-12-23 DIAGNOSIS — Z510 Encounter for antineoplastic radiation therapy: Secondary | ICD-10-CM | POA: Diagnosis not present

## 2023-12-23 DIAGNOSIS — C771 Secondary and unspecified malignant neoplasm of intrathoracic lymph nodes: Secondary | ICD-10-CM | POA: Diagnosis not present

## 2023-12-23 DIAGNOSIS — Z7901 Long term (current) use of anticoagulants: Secondary | ICD-10-CM | POA: Diagnosis not present

## 2023-12-23 DIAGNOSIS — I700 Atherosclerosis of aorta: Secondary | ICD-10-CM | POA: Diagnosis not present

## 2023-12-23 DIAGNOSIS — Z90710 Acquired absence of both cervix and uterus: Secondary | ICD-10-CM | POA: Diagnosis not present

## 2023-12-23 DIAGNOSIS — I10 Essential (primary) hypertension: Secondary | ICD-10-CM | POA: Diagnosis not present

## 2023-12-23 DIAGNOSIS — Z9221 Personal history of antineoplastic chemotherapy: Secondary | ICD-10-CM | POA: Diagnosis not present

## 2023-12-23 DIAGNOSIS — Z79899 Other long term (current) drug therapy: Secondary | ICD-10-CM | POA: Diagnosis not present

## 2023-12-23 DIAGNOSIS — Z8042 Family history of malignant neoplasm of prostate: Secondary | ICD-10-CM | POA: Diagnosis not present

## 2023-12-23 DIAGNOSIS — Z1721 Progesterone receptor positive status: Secondary | ICD-10-CM | POA: Diagnosis not present

## 2023-12-23 DIAGNOSIS — Z86718 Personal history of other venous thrombosis and embolism: Secondary | ICD-10-CM | POA: Diagnosis not present

## 2023-12-23 DIAGNOSIS — Z809 Family history of malignant neoplasm, unspecified: Secondary | ICD-10-CM | POA: Diagnosis not present

## 2023-12-23 DIAGNOSIS — Z5112 Encounter for antineoplastic immunotherapy: Secondary | ICD-10-CM | POA: Diagnosis not present

## 2023-12-23 DIAGNOSIS — Z170 Estrogen receptor positive status [ER+]: Secondary | ICD-10-CM | POA: Diagnosis not present

## 2023-12-23 DIAGNOSIS — C541 Malignant neoplasm of endometrium: Secondary | ICD-10-CM | POA: Diagnosis not present

## 2023-12-23 DIAGNOSIS — M545 Low back pain, unspecified: Secondary | ICD-10-CM | POA: Diagnosis not present

## 2023-12-23 LAB — RAD ONC ARIA SESSION SUMMARY
Course Elapsed Days: 13
Plan Fractions Treated to Date: 10
Plan Prescribed Dose Per Fraction: 3 Gy
Plan Total Fractions Prescribed: 10
Plan Total Prescribed Dose: 30 Gy
Reference Point Dosage Given to Date: 30 Gy
Reference Point Session Dosage Given: 3 Gy
Session Number: 10

## 2023-12-24 NOTE — Radiation Completion Notes (Signed)
 Patient Name: Jean Morris, Jean Morris MRN: [id number] Date of Birth: 1975/06/19 Referring Physician: CURTIS BOOM, M.D. Date of Service: 2023-12-24 Radiation Oncologist: Marcey Penton, M.D. 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  She was diagnosed with stage IVb endometrioid endometrial carcinoma.  Her initial pelvic ultrasound back in 23 showed a 19 x 13 x 15 cm uterus with endometrial thickness measuring approximately 7.5 cm.  Endometrial biopsy was positive for FIGO grade 2 endometrioid adenocarcinoma.  She was started onnorethindrone although developed a PE in January 2017.  She is morbidly obese had gone undergone multiple courses of CarboTaxol and Keytruda .  PET/CT in March 2024 showing large uterus hypermetabolic endometrial thickening consistent with known endometrial cancer.  She underwent a TLH BSO at [facility] showing residual endometrial adenocarcinoma the endometrium FIGO grade 2 of 3 with marked treatment effect invading 18 mm of 34 mm thick myometrium.  Pelvic washings were negative as well as ovaries fallopian tube she was continued on Pembro.  Recent PET 3C staging back in July 2024 showed aortocaval node measuring 8 mm with an SUV of 3.5.  She completed Pembro maintenance although the node was still positive on PET/CT.  Tumor is ER positive and PR positive negative for her 2.  She is now referred to ration collagen for consideration of treatment to the residual hypermetabolic PA node.  She is seen today for evaluation she is doing well.  She states her abdominal pain is not significant.  She is having no significant lower urinary tract symptoms  or abnormal bowel movements.      ==========DELIVERED PLANS==========  First Treatment Date: 2023-12-10 Last Treatment Date: 2023-12-23   Plan Name: Abd Site: Abdomen Technique: IMRT Mode: Photon Dose Per Fraction: 3 Gy Prescribed Dose (Delivered / Prescribed): 30 Gy / 30 Gy Prescribed Fxs (Delivered / Prescribed): 10 / 10     ==========ON TREATMENT VISIT DATES========== 2023-12-15, 2023-12-22     ==========UPCOMING VISITS========== 02/26/2024 [facility] EST PT Melanee Annah BROCKS, MD  02/26/2024 [facility] INF PORT FLUSH W/LAB [facility]PORT FLUSH  02/17/2024 [facility] EST PT 30 [facility] GYN ONC  02/15/2024 [facility]-PET CT NM PET WB & SB TO MID THIGH [facility]PET CT1  01/25/2024 [facility] RAD ONCOLOGY FOLLOW UP 30 Chrystal, Marcey, MD  01/06/2024 [facility] PORT FLUSH [facility]PORT FLUSH        ==========APPENDIX - ON TREATMENT VISIT NOTES==========   See weekly On Treatment Notes in Epic for details in the Media tab (listed as Progress notes on the On Treatment Visit Dates listed above).

## 2023-12-25 ENCOUNTER — Encounter: Payer: Self-pay | Admitting: Oncology

## 2023-12-25 ENCOUNTER — Other Ambulatory Visit: Payer: Self-pay

## 2023-12-28 ENCOUNTER — Other Ambulatory Visit: Payer: Self-pay | Admitting: Oncology

## 2023-12-29 ENCOUNTER — Encounter: Payer: Self-pay | Admitting: Oncology

## 2024-01-02 ENCOUNTER — Other Ambulatory Visit: Payer: Self-pay | Admitting: Oncology

## 2024-01-04 ENCOUNTER — Encounter: Payer: Self-pay | Admitting: Oncology

## 2024-01-06 ENCOUNTER — Inpatient Hospital Stay: Attending: Obstetrics and Gynecology

## 2024-01-06 DIAGNOSIS — Z9221 Personal history of antineoplastic chemotherapy: Secondary | ICD-10-CM | POA: Insufficient documentation

## 2024-01-06 DIAGNOSIS — Z86711 Personal history of pulmonary embolism: Secondary | ICD-10-CM | POA: Diagnosis not present

## 2024-01-06 DIAGNOSIS — Z791 Long term (current) use of non-steroidal anti-inflammatories (NSAID): Secondary | ICD-10-CM | POA: Insufficient documentation

## 2024-01-06 DIAGNOSIS — Z90710 Acquired absence of both cervix and uterus: Secondary | ICD-10-CM | POA: Insufficient documentation

## 2024-01-06 DIAGNOSIS — K5900 Constipation, unspecified: Secondary | ICD-10-CM | POA: Diagnosis not present

## 2024-01-06 DIAGNOSIS — I10 Essential (primary) hypertension: Secondary | ICD-10-CM | POA: Insufficient documentation

## 2024-01-06 DIAGNOSIS — Z452 Encounter for adjustment and management of vascular access device: Secondary | ICD-10-CM | POA: Diagnosis not present

## 2024-01-06 DIAGNOSIS — R918 Other nonspecific abnormal finding of lung field: Secondary | ICD-10-CM | POA: Diagnosis not present

## 2024-01-06 DIAGNOSIS — Z809 Family history of malignant neoplasm, unspecified: Secondary | ICD-10-CM | POA: Insufficient documentation

## 2024-01-06 DIAGNOSIS — Z7901 Long term (current) use of anticoagulants: Secondary | ICD-10-CM | POA: Insufficient documentation

## 2024-01-06 DIAGNOSIS — G8929 Other chronic pain: Secondary | ICD-10-CM | POA: Insufficient documentation

## 2024-01-06 DIAGNOSIS — M545 Low back pain, unspecified: Secondary | ICD-10-CM | POA: Diagnosis not present

## 2024-01-06 DIAGNOSIS — C772 Secondary and unspecified malignant neoplasm of intra-abdominal lymph nodes: Secondary | ICD-10-CM | POA: Diagnosis not present

## 2024-01-06 DIAGNOSIS — R350 Frequency of micturition: Secondary | ICD-10-CM | POA: Insufficient documentation

## 2024-01-06 DIAGNOSIS — Z79899 Other long term (current) drug therapy: Secondary | ICD-10-CM | POA: Insufficient documentation

## 2024-01-06 DIAGNOSIS — Z90722 Acquired absence of ovaries, bilateral: Secondary | ICD-10-CM | POA: Diagnosis not present

## 2024-01-06 DIAGNOSIS — C541 Malignant neoplasm of endometrium: Secondary | ICD-10-CM | POA: Insufficient documentation

## 2024-01-07 ENCOUNTER — Other Ambulatory Visit: Payer: Self-pay | Admitting: Oncology

## 2024-01-07 ENCOUNTER — Other Ambulatory Visit: Payer: Self-pay | Admitting: Radiation Oncology

## 2024-01-08 ENCOUNTER — Inpatient Hospital Stay: Admitting: Nurse Practitioner

## 2024-01-08 ENCOUNTER — Other Ambulatory Visit: Payer: Self-pay | Admitting: *Deleted

## 2024-01-08 ENCOUNTER — Encounter: Payer: Self-pay | Admitting: Family Medicine

## 2024-01-08 ENCOUNTER — Encounter: Payer: Self-pay | Admitting: Oncology

## 2024-01-08 DIAGNOSIS — M545 Low back pain, unspecified: Secondary | ICD-10-CM

## 2024-01-08 DIAGNOSIS — C541 Malignant neoplasm of endometrium: Secondary | ICD-10-CM

## 2024-01-08 DIAGNOSIS — R102 Pelvic and perineal pain unspecified side: Secondary | ICD-10-CM

## 2024-01-08 NOTE — Telephone Encounter (Signed)
 Why am I renewing this?

## 2024-01-11 ENCOUNTER — Encounter: Payer: Self-pay | Admitting: Nurse Practitioner

## 2024-01-11 ENCOUNTER — Inpatient Hospital Stay: Admitting: Nurse Practitioner

## 2024-01-11 ENCOUNTER — Inpatient Hospital Stay

## 2024-01-11 VITALS — BP 121/77 | HR 73 | Temp 99.0°F | Resp 18 | Wt 345.0 lb

## 2024-01-11 DIAGNOSIS — R350 Frequency of micturition: Secondary | ICD-10-CM | POA: Diagnosis not present

## 2024-01-11 DIAGNOSIS — R102 Pelvic and perineal pain unspecified side: Secondary | ICD-10-CM

## 2024-01-11 DIAGNOSIS — M545 Low back pain, unspecified: Secondary | ICD-10-CM

## 2024-01-11 DIAGNOSIS — C541 Malignant neoplasm of endometrium: Secondary | ICD-10-CM | POA: Diagnosis not present

## 2024-01-11 LAB — CBC WITH DIFFERENTIAL (CANCER CENTER ONLY)
Abs Immature Granulocytes: 0.04 K/uL (ref 0.00–0.07)
Basophils Absolute: 0.1 K/uL (ref 0.0–0.1)
Basophils Relative: 1 %
Eosinophils Absolute: 0.3 K/uL (ref 0.0–0.5)
Eosinophils Relative: 6 %
HCT: 37 % (ref 36.0–46.0)
Hemoglobin: 13.1 g/dL (ref 12.0–15.0)
Immature Granulocytes: 1 %
Lymphocytes Relative: 24 %
Lymphs Abs: 1.2 K/uL (ref 0.7–4.0)
MCH: 26.3 pg (ref 26.0–34.0)
MCHC: 35.4 g/dL (ref 30.0–36.0)
MCV: 74.1 fL — ABNORMAL LOW (ref 80.0–100.0)
Monocytes Absolute: 0.4 K/uL (ref 0.1–1.0)
Monocytes Relative: 8 %
Neutro Abs: 3.2 K/uL (ref 1.7–7.7)
Neutrophils Relative %: 60 %
Platelet Count: 264 K/uL (ref 150–400)
RBC: 4.99 MIL/uL (ref 3.87–5.11)
RDW: 15.9 % — ABNORMAL HIGH (ref 11.5–15.5)
WBC Count: 5.2 K/uL (ref 4.0–10.5)
nRBC: 0 % (ref 0.0–0.2)

## 2024-01-11 LAB — URINALYSIS, COMPLETE (UACMP) WITH MICROSCOPIC
Bilirubin Urine: NEGATIVE
Glucose, UA: NEGATIVE mg/dL
Hgb urine dipstick: NEGATIVE
Ketones, ur: NEGATIVE mg/dL
Nitrite: NEGATIVE
Protein, ur: NEGATIVE mg/dL
Specific Gravity, Urine: 1.012 (ref 1.005–1.030)
pH: 5 (ref 5.0–8.0)

## 2024-01-11 LAB — BASIC METABOLIC PANEL - CANCER CENTER ONLY
Anion gap: 8 (ref 5–15)
BUN: 12 mg/dL (ref 6–20)
CO2: 25 mmol/L (ref 22–32)
Calcium: 9.2 mg/dL (ref 8.9–10.3)
Chloride: 102 mmol/L (ref 98–111)
Creatinine: 0.84 mg/dL (ref 0.44–1.00)
GFR, Estimated: >60 mL/min (ref >60)
Glucose, Bld: 110 mg/dL — ABNORMAL HIGH (ref 70–99)
Potassium: 4 mmol/L (ref 3.5–5.1)
Sodium: 135 mmol/L (ref 135–145)

## 2024-01-11 NOTE — Progress Notes (Signed)
 Hematology/Oncology Consult note [facility]  [phone] [phone]  Patient Care Team: Wellington Curtis LABOR, FNP as PCP - General (Family Medicine) Maurie Rayfield BIRCH, RN as Oncology Nurse Navigator Melanee Annah BROCKS, MD as Medical Oncologist (Oncology)   Name of the patient: Jean Morris  [id number]  01/12/1976   Date of visit: 01/11/24  Diagnosis-  metastatic stage IVb endometrioid endometrial cancer      Chief complaint/ Reason for visit-on treatment assessment prior to cycle 14 of adjuvant Keytruda   Heme/Onc history: Patient is a 48 year old female who has seen Dr. Janit for irregular menstrual bleeding.  He has had an IUD in place and initially it was attribute it to use of IUD and was tried to be controlled with progesterone agents.  However due to worsening abdominal pain patient underwent a CT abdomen and pelvis with contrast on 12/25/2021 which showed 8 mm   Posterior lung base mass.  Retroperitoneal adenopathy measuring 2.6 cm additional retrocrural adenopathy.  Faint lucent lesion involving L2 vertebral body.  The uterus is anteverted and enlarged.  Endometrium is enlarged heterogeneous and irregular extending to the fundal myometrium.  Findings consistent with endometrial malignancy.  CT chest without contrast showed a 10 mm right lower lobe subpleural nodule.   Patient had endometrial biopsy which was consistent FIGO grade 2 endometrioid endometrial carcinoma.  Patient was seen by GYN oncology and hopes to take her for surgery if she has good response to neoadjuvant chemotherapy.MSI unstable.  BRAF negative.  Loss of major and minor MMR proteins MLH1 and PMS2 less than 5% of tumor expression.  MLH1 hyper methylation present.  This is most likely due to somatic epigenetic modification which can be seen in about 77% of sporadic endometrial cancers.   Patient completed 6 cycles of CarboTaxol Keytruda  chemotherapy and is currently on  maintenance Keytruda .  PET CT scan on 05/19/2022 showed enlarged uterus with hypermetabolic endometrial thickening.  Improving left periaortic nodal metastases.  No definitive bone metastases was noted.  Plan is to proceed with cytoreductive surgery at [facility].  Patient underwent hysterectomy and bilateral salpingo-oophorectomy on 06/10/2022.  Final pathology showed residual endometrioid adenocarcinoma of the endometrium FIGO grade 2 with marked treatment effect invading 18 mm and a 34 mm thick myometrium.  Fallopian tubes and ovaries were negative for malignancy.  Pelvic washings were negative for malignancy.  Interval history-patient has mild chronic back pain for which she takes Tylenol  and occasional oxycodone .  Symptoms are overall stable.  She reports the symptom management clinic today due to concerns of frequent urination last week.  She reports that she got concerned because she has had a bladder infection in the past and her symptoms were similar.  However, she reports improvement in urinary frequency.  Denies any dysuria.  She reports that she always has some feelings of pressure in her left lower abdomen and she was told this was likely from scar tissue.  She does not have any pain with palpation today of the abdomen or super pubic area.  No fever no chills.  Denies any foul odor in urine.  She reports feeling better today however she still wanted to come in and be checked out.  She also endorses constipation over the past week.  We discussed how this could also cause pressure and make her symptoms worse.  She has been using MiraLAX  daily.  Reports only having very small bowel movements a little each day.  Instructed her to start taking Senokot over-the-counter  twice daily.  She verbalizes understanding.  ECOG PS- 0 Pain scale- 0   Review of systems- Review of Systems  Constitutional:  Negative for chills, fever, malaise/fatigue and weight loss.  HENT:  Negative for congestion, ear discharge and  nosebleeds.   Eyes:  Negative for blurred vision.  Respiratory:  Negative for cough, hemoptysis, sputum production, shortness of breath and wheezing.   Cardiovascular:  Negative for chest pain, palpitations, orthopnea and claudication.  Gastrointestinal:  Negative for abdominal pain, blood in stool, constipation, diarrhea, heartburn, melena, nausea and vomiting.  Genitourinary:  Negative for dysuria, flank pain, frequency, hematuria and urgency.  Musculoskeletal:  Positive for back pain. Negative for joint pain and myalgias.  Skin:  Negative for rash.  Neurological:  Negative for dizziness, tingling, focal weakness, seizures, weakness and headaches.  Endo/Heme/Allergies:  Does not bruise/bleed easily.  Psychiatric/Behavioral:  Negative for depression and suicidal ideas. The patient does not have insomnia.       Allergies  Allergen Reactions   Bee Pollen Nausea And Vomiting    Itchy, swelling, watery eyes, runny nose.   Pollen Extract Nausea And Vomiting    Itchy, swelling, watery eyes, runny nose.     Past Medical History:  Diagnosis Date   Anemia    Endometrial cancer (HCC)    Hypertension    Menorrhagia    Pulmonary embolism [facility])      Past Surgical History:  Procedure Laterality Date   ABDOMINAL HYSTERECTOMY Bilateral 06/10/2022   at [facility]   CESAREAN SECTION  02/25/1992   COLONOSCOPY WITH PROPOFOL  N/A 08/06/2021   Procedure: COLONOSCOPY WITH PROPOFOL ;  Surgeon: Therisa Bi, MD;  Location: [facility] ENDOSCOPY;  Service: Gastroenterology;  Laterality: N/A;   ESOPHAGOGASTRODUODENOSCOPY (EGD) WITH PROPOFOL  N/A 02/09/2020   Procedure: ESOPHAGOGASTRODUODENOSCOPY (EGD) WITH PROPOFOL ;  Surgeon: Therisa Bi, MD;  Location: [facility] ENDOSCOPY;  Service: Gastroenterology;  Laterality: N/A;   IR IMAGING GUIDED PORT INSERTION  01/17/2022   IR RADIOLOGIST EVAL & MGMT  04/28/2023   IR RADIOLOGIST EVAL & MGMT  05/05/2023    Social History   Socioeconomic History   Marital status: Human Resources Officer    Spouse name: Not on file   Number of children: Not on file   Years of education: Not on file   Highest education level: Not on file  Occupational History   Not on file  Tobacco Use   Smoking status: Never   Smokeless tobacco: Never  Vaping Use   Vaping status: Never Used  Substance and Sexual Activity   Alcohol use: No   Drug use: No   Sexual activity: Not Currently    Birth control/protection: I.U.D.    Comment: patient states MD said did not see in CT today 01/17/2022  Other Topics Concern   Not on file  Social History Narrative   Lives with Jean Morris, partner, and no pets.   Social Drivers of Corporate Investment Banker Strain: Not on file  Food Insecurity: No Food Insecurity (10/14/2023)   Hunger Vital Sign    Worried About Running Out of Food in the Last Year: Never true    Ran Out of Food in the Last Year: Never true  Transportation Needs: No Transportation Needs (10/14/2023)   PRAPARE - Administrator, Civil Service (Medical): No    Lack of Transportation (Non-Medical): No  Physical Activity: Not on file  Stress: Not on file  Social Connections: Not on file  Intimate Partner Violence: Not At Risk (10/14/2023)   Humiliation,  Afraid, Rape, and Kick questionnaire    Fear of Current or Ex-Partner: No    Emotionally Abused: No    Physically Abused: No    Sexually Abused: No    Family History  Problem Relation Age of Onset   Hypertension Mother    Stroke Mother    Heart attack Mother    Prostate cancer Father    Hypertension Father    Cancer Sister    Cancer Sister    Cancer Brother    Hypertension Other      Current Outpatient Medications:    clotrimazole -betamethasone  (LOTRISONE ) cream, APPLY TO AFFECTED AREA TWICE A DAY, Disp: , Rfl:    dexamethasone  (DECADRON ) 4 MG tablet, TAKE 2 TABLETS BY MOUTH STARTING THE DAY AFTER CHEMOTHERAPY FOR 3 DAYS. TAKE WITH FOOD., Disp: , Rfl:    diclofenac  Sodium (VOLTAREN ) 1 % GEL, Apply 4 g topically 4  (four) times daily., Disp: 50 g, Rfl: 0   doxycycline  (VIBRA -TABS) 100 MG tablet, TAKE 1 TABLET BY MOUTH 2 TIMES DAILY FOR 7 DAYS., Disp: , Rfl:    ELIQUIS  2.5 MG TABS tablet, TAKE 1 TABLET BY MOUTH TWICE A DAY, Disp: 60 tablet, Rfl: 3   lidocaine -prilocaine  (EMLA ) cream, Apply to port site as directed, Disp: 30 g, Rfl: 3   magnesium  hydroxide (MILK OF MAGNESIA) 400 MG/5ML suspension, Take 15 mLs by mouth daily as needed for moderate constipation. Take if no bowel movement for 2 days., Disp: , Rfl:    METHYLPREDNISOLONE , PAK, PO, Take 1 dose pk by oral route., Disp: , Rfl:    nystatin  (MYCOSTATIN /NYSTOP ) powder, Apply 1 Application topically 3 (three) times daily., Disp: 30 g, Rfl: 3   oxyCODONE  (OXY IR/ROXICODONE ) 5 MG immediate release tablet, Take one tablet daily as needed for pain., Disp: 60 tablet, Rfl: 0   polyethylene glycol (MIRALAX ) 17 g packet, Take 17 g by mouth daily. To prevent constipation, Disp: 30 each, Rfl: 3   pregabalin  (LYRICA ) 75 MG capsule, TAKE 1 CAPSULE BY MOUTH TWICE A DAY, Disp: 60 capsule, Rfl: 0   semaglutide -weight management (WEGOVY ) 0.5 MG/0.5ML SOAJ SQ injection, Inject 0.5 mg into the skin once a week., Disp: 2 mL, Rfl: 1   LACTULOSE  PO, TAKE 15-30 MLS (10-20 G TOTAL) BY MOUTH 2 (TWO) TIMES DAILY AS NEEDED (CONSTIPATION). (Patient not taking: Reported on 01/11/2024), Disp: , Rfl:    LORazepam  (ATIVAN ) 0.5 MG tablet, Take 1 tablet (0.5 mg total) by mouth every 8 (eight) hours. (Patient not taking: Reported on 01/11/2024), Disp: 30 tablet, Rfl: 0   meloxicam  (MOBIC ) 15 MG tablet, Take 1 tablet daily with meals (Patient not taking: Reported on 01/11/2024), Disp: , Rfl:   Physical exam:  Vitals:   01/11/24 0926 01/11/24 0930  BP: (!) 150/83 121/77  Pulse: 90 73  Resp: 18   Temp: 99 F (37.2 C)   TempSrc: Tympanic   SpO2: 100%   Weight: (!) 345 lb (156.5 kg)    Physical Exam Cardiovascular:     Rate and Rhythm: Normal rate and regular rhythm.     Heart  sounds: Normal heart sounds.  Pulmonary:     Effort: Pulmonary effort is normal.     Breath sounds: Normal breath sounds.  Skin:    General: Skin is warm and dry.  Neurological:     Mental Status: She is alert and oriented to person, place, and time.      I have personally reviewed labs listed below:    Latest Ref Rng &  Units 01/11/2024    9:14 AM  CMP  Glucose 70 - 99 mg/dL 889   BUN 6 - 20 mg/dL 12   Creatinine 9.55 - 1.00 mg/dL 9.15   Sodium 864 - 854 mmol/L 135   Potassium 3.5 - 5.1 mmol/L 4.0   Chloride 98 - 111 mmol/L 102   CO2 22 - 32 mmol/L 25   Calcium 8.9 - 10.3 mg/dL 9.2       Latest Ref Rng & Units 01/11/2024    9:02 AM  CBC  WBC 4.0 - 10.5 K/uL 5.2   Hemoglobin 12.0 - 15.0 g/dL 86.8   Hematocrit 63.9 - 46.0 % 37.0   Platelets 150 - 400 K/uL 264    I have personally reviewed Radiology images listed below: No images are attached to the encounter.  No results found.    Assessment and plan- Patient is a 48 y.o. female with history of stage IVb endometrioid endometrial carcinoma.  She is here for concerns of frequent urination.  Urinary frequency: resolved per patient, have frequent urination last week.  Denies any fever/chills, no dysuria, no suprapubic tenderness,  CBC and CMP checked and stable today.  UA C&S pending.  No symptoms of UTI today.  Will await culture results and treat if necessary.  Low back pain: Likely unrelated to her malignancy. Patient was noted to have a possible pathologic deformity of the L2 vertebral body but her pain appears to be lower down.  She was evaluated by interventional radiology as well and received facet injections which helped temporarily.  She is presently on Tylenol  arthritis and as needed oxycodone  which she will continue.  Ongoing but stable encouraged her to f/u with orthopedics.  She reports having a hard time getting appointment I will reach out and see if I can get her an appointment.  Constipation: Patient reports  ongoing constipation only having small hard BM daily.  She is taking mira lax daily, encouraged her to take senokot otc twice daily.   F/U Plan: Follow up PRN and as scheduled LP     Morna Husband AGNP-C [facility] [phone] 01/11/2024 11:02 AM

## 2024-01-11 NOTE — Progress Notes (Signed)
 Patient here for oncology follow-up appointment, concerns of consistent abdominal pain

## 2024-01-12 LAB — URINE CULTURE: Culture: <10000 — AB

## 2024-01-15 ENCOUNTER — Encounter: Payer: Self-pay | Admitting: Family Medicine

## 2024-01-15 ENCOUNTER — Ambulatory Visit: Admitting: Family Medicine

## 2024-01-15 VITALS — BP 119/79 | HR 89 | Ht 65.0 in | Wt 344.4 lb

## 2024-01-15 DIAGNOSIS — Z6841 Body Mass Index (BMI) 40.0 and over, adult: Secondary | ICD-10-CM

## 2024-01-15 DIAGNOSIS — C541 Malignant neoplasm of endometrium: Secondary | ICD-10-CM

## 2024-01-15 MED ORDER — SEMAGLUTIDE-WEIGHT MANAGEMENT 1.7 MG/0.75ML ~~LOC~~ SOAJ: 1.7000 mg | SUBCUTANEOUS | 1 refills | Status: DC

## 2024-01-15 MED ORDER — WEGOVY 1 MG/0.5ML ~~LOC~~ SOAJ: 1.0000 mg | SUBCUTANEOUS | 0 refills | Status: DC

## 2024-01-15 NOTE — Progress Notes (Signed)
 Established Patient Office Visit  Introduced to nurse practitioner role and practice setting.  All questions answered.  Discussed provider/patient relationship and expectations.   Subjective   Patient ID: Jean Morris, female    DOB: 06-13-1975  Age: 48 y.o. MRN: [id number]  Chief Complaint  Patient presents with   Follow-up   Obesity    Discussed the use of AI scribe software for clinical note transcription with the patient, who gave verbal consent to proceed.  History of Present Illness Jean Morris is a 48 year old female who presents for a weight check and medication tolerance assessment.  She has lost twenty pounds since her last visit. She is currently on Wegovy , having started the 0.5 mg dose last week. She experiences constipation, which was present prior to starting the medication and may be related to intermittent use of pain medications.  She is monitoring her protein intake, primarily consuming eggs and protein drinks, but is seeking alternatives as she feels the current drinks may be contributing to weight retention. She is considering options like Greek yogurt and low-carb protein drinks to meet her daily protein goal of 80 to 100 grams.  She has a history of endometrial cancer and recently completed ten treatments of radiation in October. She has a follow-up appointment with her radiation oncologist on December 1st and a PET scan scheduled for mid-December to assess her current status.  No issues with gastric reflux and she is maintaining adequate hydration, with normal bowel and bladder function. She is preparing to host Thanksgiving at her home, planning to cook for the first time in two years, with specialties including potato salad, sweet potato pie, and baked macaroni and cheese.       01/11/2024    9:23 AM 11/25/2023   10:36 AM 11/12/2023    3:32 PM  Depression screen PHQ 2/9  Decreased Interest 0 0 2  Down, Depressed, Hopeless 0 0 0  PHQ - 2 Score 0 0 2   Altered sleeping 1 1 1   Tired, decreased energy 0 1 2  Change in appetite 0 1 1  Feeling bad or failure about yourself  0 0 0  Trouble concentrating 0 0 0  Moving slowly or fidgety/restless 0 0 0  Suicidal thoughts 0 0 0  PHQ-9 Score 1 3  6    Difficult doing work/chores   Somewhat difficult     Data saved with a previous flowsheet row definition       11/12/2023    3:32 PM 10/15/2023    3:13 PM  GAD 7 : Generalized Anxiety Score  Nervous, Anxious, on Edge 1 1  Control/stop worrying 1 2  Worry too much - different things 1 2  Trouble relaxing 1 1  Restless 0 0  Easily annoyed or irritable 1 1  Afraid - awful might happen 0 0  Total GAD 7 Score 5 7  Anxiety Difficulty Somewhat difficult Somewhat difficult     ROS  Negative unless indicated in HPI   Objective:     BP 119/79 (BP Location: Left Arm, Patient Position: Sitting, Cuff Size: Large)   Pulse 89   Ht 5' 5 (1.651 m)   Wt (!) 344 lb 6.4 oz (156.2 kg)   LMP 02/17/2022 (Approximate)   SpO2 99%   BMI 57.31 kg/m    Physical Exam Constitutional:      General: She is not in acute distress.    Appearance: Normal appearance. She is obese. She is not  ill-appearing, toxic-appearing or diaphoretic.  HENT:     Head: Normocephalic.     Nose: Nose normal.     Mouth/Throat:     Mouth: Mucous membranes are moist.     Pharynx: Oropharynx is clear.  Eyes:     Extraocular Movements: Extraocular movements intact.     Pupils: Pupils are equal, round, and reactive to light.  Cardiovascular:     Rate and Rhythm: Normal rate and regular rhythm.     Pulses: Normal pulses.     Heart sounds: Normal heart sounds. No murmur heard.    No friction rub. No gallop.  Pulmonary:     Effort: No respiratory distress.     Breath sounds: No stridor. No wheezing, rhonchi or rales.  Chest:     Chest wall: No tenderness.  Abdominal:     General: Abdomen is flat.     Palpations: Abdomen is soft.  Musculoskeletal:     Right lower leg:  No edema.     Left lower leg: No edema.  Skin:    General: Skin is warm and dry.     Capillary Refill: Capillary refill takes less than 2 seconds.  Neurological:     General: No focal deficit present.     Mental Status: She is alert and oriented to person, place, and time. Mental status is at baseline.     Cranial Nerves: No cranial nerve deficit.     Motor: No weakness.     Gait: Gait normal.  Psychiatric:        Mood and Affect: Mood normal.        Behavior: Behavior normal.        Thought Content: Thought content normal.        Judgment: Judgment normal.      No results found for any visits on 01/15/24.    The 10-year ASCVD risk score (Arnett DK, et al., 2019) is: 1.2%    Assessment & Plan:  Morbid obesity with BMI of 60.0-69.9, adult (HCC) -     Wegovy ; Inject 1 mg into the skin once a week.  Dispense: 2 mL; Refill: 0 -     Semaglutide -Weight Management; Inject 1.7 mg into the skin once a week.  Dispense: 3 mL; Refill: 1  Endometrial cancer (HCC)     Assessment and Plan Assessment & Plan Morbid obesity  - managed with Wegovy . Tolerating medication well - She has lost 20 pounds since the last visit.  - Starting BMI was 61.25, today is 57.31 - Currently on 0.5 mg dose, tolerating well with mild constipation, pre-existing.  - No significant gastric reflux reported.  - Adequate protein intake is encouraged to prevent malnutrition. - Continue Wegovy  0.5 mg this week. Plan to increase dose. - Will increase Wegovy  to 1 mg starting next week. - Will increase Wegovy  to 1.7 mg in four weeks. - Ensure adequate protein intake, aiming for 80-100 grams per day. - Encouraged hydration and balanced diet. F/u in two months for weight check - recent labs by oncology - all stable - may take daily miralax  is constipation develops  Endometrial Cancer Cancer under active surveillance following completion of radiation therapy in October. Scheduled for a PET scan in December to  assess treatment response. Follow-up with radiation oncology is planned for December 1st. - Continue with scheduled PET scan in December. - Attend follow-up appointment with radiation oncology on December 1st.  Return in about 2 months (around 03/16/2024) for Weight check.   I,  Curtis DELENA Boom, FNP, have reviewed all documentation for this visit. The documentation on 01/15/24 for the exam, diagnosis, procedures, and orders are all accurate and complete.   Curtis DELENA Boom, FNP

## 2024-01-15 NOTE — Patient Instructions (Signed)

## 2024-01-17 ENCOUNTER — Other Ambulatory Visit: Payer: Self-pay

## 2024-01-25 ENCOUNTER — Ambulatory Visit
Admission: RE | Admit: 2024-01-25 | Discharge: 2024-01-25 | Disposition: A | Discharge status: Home / Self Care | Source: Ambulatory Visit | Attending: Radiation Oncology | Admitting: Radiation Oncology

## 2024-01-25 ENCOUNTER — Encounter: Payer: Self-pay | Admitting: Radiation Oncology

## 2024-01-25 ENCOUNTER — Encounter: Payer: Self-pay | Admitting: Oncology

## 2024-01-25 ENCOUNTER — Telehealth: Payer: Self-pay | Admitting: Radiation Oncology

## 2024-01-25 VITALS — BP 114/73 | HR 90 | Temp 98.0°F | Resp 18 | Ht 65.0 in | Wt 344.2 lb

## 2024-01-25 DIAGNOSIS — Z923 Personal history of irradiation: Secondary | ICD-10-CM | POA: Diagnosis not present

## 2024-01-25 DIAGNOSIS — C771 Secondary and unspecified malignant neoplasm of intrathoracic lymph nodes: Secondary | ICD-10-CM | POA: Diagnosis not present

## 2024-01-25 DIAGNOSIS — C541 Malignant neoplasm of endometrium: Secondary | ICD-10-CM | POA: Insufficient documentation

## 2024-01-25 DIAGNOSIS — K5900 Constipation, unspecified: Secondary | ICD-10-CM | POA: Diagnosis not present

## 2024-01-25 NOTE — Progress Notes (Signed)
 Radiation Oncology Follow up Note  Name: Jean Morris   Date:   01/25/2024 MRN:  [id number] DOB: August 22, 1975    This 48 y.o. female presents to the clinic today for 1 month follow-up status post palliative radiation therapy to her periaortic region for involvement of residual disease in patient with stage IVb endometrial adenocarcinoma.  REFERRING PROVIDER: Wellington Curtis LABOR, FNP  HPI: Patient is a 48 year old female now out 1 month having completed palliative radiation therapy for a residual periaortic disease by PET/CT criteria and patient with known stage IV endometrial adenocarcinoma..  Patient has been under TLH BSO at [facility] showing residual endometrial adenocarcinoma FIGO grade 2 of 3 with marked treatment effect invading 18 mm of 34 mm thick of the myometrium.  Pelvic washings were negative.  She had a PET/CT that showed aortocaval node measuring 8 mm.  She completed Pembro maintenance therapy although node was still positive on PET CT scan.  She underwent SBRT treatment to that node.  She is seen today in follow-up and is doing well she does occasionally have some left lower quadrant pain mostly associated with her bowel pattern.  She does occasionally take MiraLAX  for constipation.  She is otherwise asymptomatic at this time.  She is not specifically denies any lower urinary tract symptoms.  Patient has a PET scan scheduled for the end of December.  COMPLICATIONS OF TREATMENT: none  FOLLOW UP COMPLIANCE: keeps appointments   PHYSICAL EXAM:  BP 114/73   Pulse 90   Temp 98 F (36.7 C)   Resp 18   Ht 5' 5 (1.651 m)   Wt (!) 344 lb 3.2 oz (156.1 kg)   LMP 02/17/2022 (Approximate)   BMI 57.28 kg/m  Obese female in NAD.  Well-developed well-nourished patient in NAD. HEENT reveals PERLA, EOMI, discs not visualized.  Oral cavity is clear. No oral mucosal lesions are identified. Neck is clear without evidence of cervical or supraclavicular adenopathy. Lungs are clear to A&P. Cardiac  examination is essentially unremarkable with regular rate and rhythm without murmur rub or thrill. Abdomen is benign with no organomegaly or masses noted. Motor sensory and DTR levels are equal and symmetric in the upper and lower extremities. Cranial nerves II through XII are grossly intact. Proprioception is intact. No peripheral adenopathy or edema is identified. No motor or sensory levels are noted. Crude visual fields are within normal range.  RADIOLOGY RESULTS: No current films for review  PLAN: At the present time we will review her PET scan which is to be performed at the end of this month.  I believe this is a little too close to her treatments for accurate assessment although we will review that when available.  I have ordered a CT scan of her abdomen pelvis in about 4 months.  She continues close follow-up care and observation through medical oncology.  Patient knows to call with any concerns.  I would like to take this opportunity to thank you for allowing me to participate in the care of your patient.SABRA Marcey Penton, MD

## 2024-01-25 NOTE — Telephone Encounter (Signed)
 Called pt to sched CT - pt confirmed date/time/location - requested appt reminder via mail - LH

## 2024-01-26 ENCOUNTER — Other Ambulatory Visit: Payer: Self-pay

## 2024-02-06 ENCOUNTER — Other Ambulatory Visit: Payer: Self-pay | Admitting: Oncology

## 2024-02-08 ENCOUNTER — Encounter: Payer: Self-pay | Admitting: Oncology

## 2024-02-10 ENCOUNTER — Inpatient Hospital Stay: Attending: Obstetrics and Gynecology | Admitting: Obstetrics and Gynecology

## 2024-02-10 ENCOUNTER — Encounter: Payer: Self-pay | Admitting: Obstetrics and Gynecology

## 2024-02-10 ENCOUNTER — Inpatient Hospital Stay

## 2024-02-10 VITALS — BP 123/80 | HR 95 | Temp 98.9°F | Ht 65.0 in | Wt 343.0 lb

## 2024-02-10 DIAGNOSIS — Z791 Long term (current) use of non-steroidal anti-inflammatories (NSAID): Secondary | ICD-10-CM | POA: Diagnosis not present

## 2024-02-10 DIAGNOSIS — Z86711 Personal history of pulmonary embolism: Secondary | ICD-10-CM | POA: Diagnosis not present

## 2024-02-10 DIAGNOSIS — M47817 Spondylosis without myelopathy or radiculopathy, lumbosacral region: Secondary | ICD-10-CM | POA: Diagnosis not present

## 2024-02-10 DIAGNOSIS — Z809 Family history of malignant neoplasm, unspecified: Secondary | ICD-10-CM | POA: Insufficient documentation

## 2024-02-10 DIAGNOSIS — R590 Localized enlarged lymph nodes: Secondary | ICD-10-CM | POA: Insufficient documentation

## 2024-02-10 DIAGNOSIS — C772 Secondary and unspecified malignant neoplasm of intra-abdominal lymph nodes: Secondary | ICD-10-CM | POA: Diagnosis not present

## 2024-02-10 DIAGNOSIS — Z90722 Acquired absence of ovaries, bilateral: Secondary | ICD-10-CM | POA: Diagnosis not present

## 2024-02-10 DIAGNOSIS — Z90710 Acquired absence of both cervix and uterus: Secondary | ICD-10-CM | POA: Insufficient documentation

## 2024-02-10 DIAGNOSIS — Z79899 Other long term (current) drug therapy: Secondary | ICD-10-CM | POA: Insufficient documentation

## 2024-02-10 DIAGNOSIS — I700 Atherosclerosis of aorta: Secondary | ICD-10-CM | POA: Insufficient documentation

## 2024-02-10 DIAGNOSIS — K760 Fatty (change of) liver, not elsewhere classified: Secondary | ICD-10-CM | POA: Diagnosis not present

## 2024-02-10 DIAGNOSIS — M899 Disorder of bone, unspecified: Secondary | ICD-10-CM | POA: Diagnosis not present

## 2024-02-10 DIAGNOSIS — Z9221 Personal history of antineoplastic chemotherapy: Secondary | ICD-10-CM | POA: Insufficient documentation

## 2024-02-10 DIAGNOSIS — M47816 Spondylosis without myelopathy or radiculopathy, lumbar region: Secondary | ICD-10-CM | POA: Diagnosis not present

## 2024-02-10 DIAGNOSIS — M51369 Other intervertebral disc degeneration, lumbar region without mention of lumbar back pain or lower extremity pain: Secondary | ICD-10-CM | POA: Insufficient documentation

## 2024-02-10 DIAGNOSIS — Z8042 Family history of malignant neoplasm of prostate: Secondary | ICD-10-CM | POA: Diagnosis not present

## 2024-02-10 DIAGNOSIS — G629 Polyneuropathy, unspecified: Secondary | ICD-10-CM | POA: Diagnosis not present

## 2024-02-10 DIAGNOSIS — C541 Malignant neoplasm of endometrium: Secondary | ICD-10-CM | POA: Diagnosis not present

## 2024-02-10 DIAGNOSIS — R911 Solitary pulmonary nodule: Secondary | ICD-10-CM | POA: Insufficient documentation

## 2024-02-10 NOTE — Progress Notes (Signed)
 Gynecologic Oncology Interval Visit   Referring Provider: Dr. Lenn & Dr Babara  Chief Complaint: Endometrial adenocarcinoma, stage IVB  Subjective:  Jean Morris is a 48 y.o. female who is seen in consultation from Dr. Janit for irregular bleeding for many years, diagnosed with Stage IVb endometrioid endometrial cancer currently s/p 6 cycles of neoadjuvant carboplatin  with paclitaxel  and keytruda , TLH-BSO at [facility] with Dr Mancil on 06/10/22, followed by 14 maintenance keytruda  cycles 10/25 who returns to clinic for follow up.   In interim, she had pet scan performed which showed:   10/28/23- PET Restaging FINDINGS: Mediastinal blood pool activity: SUV max 3.8 Liver activity: SUV max NA   NECK: Misregistration artifact.  No abnormal hypermetabolism. Incidental CT findings: None.   CHEST: Mild focal distal esophageal uptake without a CT correlate. No abnormal hypermetabolism. Incidental CT findings: Right IJ Port-A-Cath terminates in the right atrium. Atherosclerotic calcification of the aorta. Heart is enlarged. No pericardial or pleural effusion.   ABDOMEN/PELVIS: Amorphous left periaortic soft tissue measures approximately 9 x 14 mm (6/91), SUV max 3.9, similar. Heterogeneous vaginal uptake, SUV max 7.4, nonspecific. No hypermetabolic lymph nodes. Incidental CT findings: None.   SKELETON: No abnormal hypermetabolism. Incidental CT findings: Degenerative changes in the spine.   IMPRESSION: 1. Minimal residual abdominal retroperitoneal left periaortic mildly hypermetabolic nodal soft tissue, stable. No evidence of disease progression. 2. Patchy vaginal hypermetabolism, nonspecific. 3.  Aortic atherosclerosis (ICD10-I70.0).  PET showed 9 mm amorphus density in left periaortic soft tissue which is similar to previous imaging.  In view of this received PA node radiation with Dr Lenn. Completed this 11/25 and follow up imaging scheduled in the coming months.   No new complaints  today.  Some persistent abdominal pain.   Gynecologic Oncology History:  Patient presented to her OB/GYN for irregular bleeding for several months.  She was thought to have an IUD in place and her bleeding was attempted to be controlled with progesterone agents.  Medical history significant for PE in [facility] in 02/2015  12/25/21- CT C/A/P-  for left lower quadrant abdominal pain showed enlarged uterus with thickened endometrium, heterogeneous and irregular extending to the fundal myometrium.  Retroperitoneal and retrocrural adenopathy.  Faint lucent lesion in left L2 vertebrae suspicious for metastasis.  Posterior right lower lobe subpleural nodule measuring 10 mm with surrounding groundglass opacity; suspicious for metastasis though was present on 08/2015 CT but measured 5 mm.   Pelvic ultrasound- Uterus-19 x 13.3 x 15 cm = volume 1974 mls.  Distinct endometrium not visible.  Heterogeneous area which may be surrounded by myometrium may be as much is 7 cm in greatest thickness. Endometrium-thickness, perhaps cystic is 75 mm.  Markedly heterogeneous with signs of internal flow. Right ovary-not visualized Left ovary-5.8 x 3.1 x 4.2 cm.  Simple dominant follicle measuring up to 3.5 cm accounts for much of the left ovarian dimension.  Pulse Doppler evaluation shows low resistance arterial and venous flow within the left ovary Other-small free fluid in the pelvis.  Endometrial biopsy- -endometrioid adenocarcinoma, FIGO grade 2  She was started on norethindrone  by Dr. Janit.   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 She was taking oral contraceptives at that time with increased dosing d/t heavy menstrual periods. Additionally, hx of iron  deficiency and has taken oral iron . Given extent of disease and VTE neoaduvant therapy was recommended.   Treatment Summary:  01/21/22- D1C1- carboplatin  02/12/22- D1C2 carbo-paclitaxel -pembrolizumab  03/05/22- D1C3  carbo-paclitaxel -pembrolizumab   03/17/22- CT Chest Abdomen  Pelvis W Contrast-  IMPRESSION: 1. Persistent heterogeneous irregularity of the endometrium with uterine enlargement compatible with known endometrial malignancy.  2. New heterogeneity of the 2.8 cm left ovarian lesion, nonspecific but possibly reflecting metastatic disease. Consider further evaluation with pelvic ultrasound. 3. Overall decreased retrocrural and retroperitoneal adenopathy. However, there is interval increase in size and adjacent infiltrative/desmoplastic stranding adjacent to a centrally necrotic left periaortic lymph node/lymph node conglomerate. 4. Continued decrease in size of the posterior right lower lobe pulmonary nodule. 5. New pathologic compression deformity of the L2 vertebral body. 6. Diffuse hepatic steatosis. 7. Mild diffuse bronchial wall thickening with mosaic attenuation of the lungs, suggestive of small airways disease.  01/21/22 D1C1 Carbo-paclitaxel -keytruda  02/12/22 D1C2 Carbo-paclitaxel -keytruda  03/05/22 D1C3 Carbo-paclitaxel -keytruda  03/26/22 D1C4 Carbo-paclitaxel -keytruda  04/16/22 D1C5 Carbo-paclitaxel -keytruda  05/07/2022-  D1C6 carbo-paclitaxel -pembrolizumab . 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 Zometa  4 mg added.  05/19/2022  PET - enlarged uterus is hypermetabolic endometrial thickening consistent with known endometrial cancer. Improving left periaortic nodal metastases. Addtiional small cervical, axillary, and inguinal nodes reflective of nodal metastases.   05/28/22-  D1C7 - pembrolizumab  06/10/22-  TLH BSO at [facility] with Dr. Mancil.  Pathology: A.  Uterus, cervix, bilateral fallopian tubes and ovaries, hysterectomy, bilateral salpingo-oophorectomy: Residual endometrioid adenocarcinoma of the endometrium (0.6 cm), FIGO grade 2 of 3, with marked treatment effect, invading 18 mm in a 34 mm thick myometrium. 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 Cervix: Negative for  malignancy. Serosa: Negative malignancy. Right and left ovaries: Negative for malignancy. Right and left fallopian tubes: Negative malignancy. Microsatellite instability and mismatch repair protein immunohistochemistry: Not performed (obtained on prior specimen). P53 immunohistochemistry: Not performed A: Pelvic Washings: negative. No evidence of malignancy.   She continued on maintenance pembrolizumab   09/09/22- PET for restaging. The aortacaval node measures 8 mm and a S.U.V. max of 3.8 on 82/4 versus 7 mm and a S.U.V. max of 3.5 on the prior exam. While this node is very small the SUV is concerning.  Given the findings below it is reasonable to complete pembrolizumab  maintenance and reimage. Overall response to therapy is reassuring. If still positive on subsequent PET consider radiation therapy and then reassess.  01/14/2023 1. Vagina, biopsy, Cuff :  POLYPOID, GRANULATION TISSUE and NEGATIVE FOR MALIGNANCY   02/09/2023 CT C/A/P IMPRESSION: 1. Stable examination without evidence of new or progressive disease within the chest, abdomen, or pelvis. 2. Similar left periaortic amorphous soft tissue density. 3. Unchanged pathologic sclerotic compression deformity of the L2 vertebral body. 4. Unchanged size of the 1.4 cm right inguinal lymph node which was minimally metabolically active on prior examination, nonspecific possibly reactive. 5.  Aortic Atherosclerosis (ICD10-I70.0).  04/30/2023 MRI Lumbar Spine IMPRESSION: 1. Pathologic compression fracture at L2 with loss of height centrally of 40-50%, not progressive since the CT scan of December. Some persistent increased T2 signal in the central portion of the vertebral body. Cannot determine if this represents successfully treated tumor or if there is any viable residual tumor. There does not appear to be any progression or any extraosseous extension however. 2. No second lesion visible elsewhere in the region. 3. Mild degenerative disc disease  at L1-2, L2-3 and L3-4 without compressive stenosis. 4. Moderate bulging of the disc at L4-5 with mild facet osteoarthritis. No compressive stenosis. 5. Mild facet osteoarthritis at L5-S1 without stenosis.   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  BRAF V600E not present. The tumor cells are NEGATIVE for Her2 (1+). Estrogen Receptor:  POSITIVE, 90%, MODERATE STAINING INTENSITY Progesterone Receptor:   POSITIVE, 80%, STRONG STAINING INTENSITY   Problem List: Patient Active Problem List   Diagnosis Date Noted   Prediabetes 11/13/2023   Low back pain 01/09/2023   Hypokalemia 04/01/2022   Endometrial cancer (HCC) 01/15/2022   Iron  deficiency anemia 01/15/2022   Morbid obesity with BMI of 60.0-69.9, adult (HCC) 04/18/2015   Abnormal uterine bleeding 04/18/2015   Anemia 04/18/2015   Pulmonary embolism (HCC) 03/11/2015    Past Medical History: Past Medical History:  Diagnosis Date   Anemia    Endometrial cancer (HCC)    Hypertension    Menorrhagia    Pulmonary embolism (HCC)     Past Surgical History: Past Surgical History:  Procedure Laterality Date   ABDOMINAL HYSTERECTOMY Bilateral 06/10/2022   at [facility]   CESAREAN SECTION  02/25/1992   COLONOSCOPY WITH PROPOFOL  N/A 08/06/2021   Procedure: COLONOSCOPY WITH PROPOFOL ;  Surgeon: Therisa Bi, MD;  Location: [facility] ENDOSCOPY;  Service: Gastroenterology;  Laterality: N/A;   ESOPHAGOGASTRODUODENOSCOPY (EGD) WITH PROPOFOL  N/A 02/09/2020   Procedure: ESOPHAGOGASTRODUODENOSCOPY (EGD) WITH PROPOFOL ;  Surgeon: Therisa Bi, MD;  Location: [facility] ENDOSCOPY;  Service: Gastroenterology;  Laterality: N/A;   IR IMAGING GUIDED PORT INSERTION  01/17/2022   IR RADIOLOGIST EVAL & MGMT  04/28/2023   IR RADIOLOGIST EVAL & MGMT  05/05/2023    Past Gynecologic History:  Menarche: age 48, last 5 days History of OCP/HRT use: yes Last pap: 03/14/21- ASCUS, HPV negative History of STDs: The patient denies history  of sexually transmitted disease. Sexually active: yes; not currently  OB History:  OB History  Gravida Para Term Preterm AB Living  3 3 3   3   SAB IAB Ectopic Multiple Live Births      3    # Outcome Date GA Lbr Len/2nd Weight Sex Type Anes PTL Lv  3 Term     M CS-LTranv   LIV  2 Term     M Vag-Spont   LIV  1 Term     F Vag-Spont   LIV   Family History: Family History  Problem Relation Age of Onset   Hypertension Mother    Stroke Mother    Heart attack Mother    Prostate cancer Father    Hypertension Father    Cancer Sister    Cancer Sister    Cancer Brother    Hypertension Other    Social History: Social History   Socioeconomic History   Marital status: Media Planner    Spouse name: Not on file   Number of children: Not on file   Years of education: Not on file   Highest education level: Not on file  Occupational History   Not on file  Tobacco Use   Smoking status: Never   Smokeless tobacco: Never  Vaping Use   Vaping status: Never Used  Substance and Sexual Activity   Alcohol use: No   Drug use: No   Sexual activity: Not Currently    Birth control/protection: I.U.D.    Comment: patient states MD said did not see in CT today 01/17/2022  Other Topics Concern   Not on file  Social History Narrative   Lives with [facility], partner, and no pets.   Social Drivers of Health   Tobacco Use: Low Risk (01/25/2024)   Patient History    Smoking Tobacco Use: Never    Smokeless Tobacco Use: Never    Passive Exposure: Not on file  Financial Resource Strain: Not  on file  Food Insecurity: No Food Insecurity (10/14/2023)   Epic    Worried About Programme Researcher, Broadcasting/film/video in the Last Year: Never true    Ran Out of Food in the Last Year: Never true  Transportation Needs: No Transportation Needs (10/14/2023)   Epic    Lack of Transportation (Medical): No    Lack of Transportation (Non-Medical): No  Physical Activity: Not on file  Stress: Not on file  Social Connections: Not  on file  Intimate Partner Violence: Not At Risk (10/14/2023)   Epic    Fear of Current or Ex-Partner: No    Emotionally Abused: No    Physically Abused: No    Sexually Abused: No  Depression (PHQ2-9): Low Risk (01/25/2024)   Depression (PHQ2-9)    PHQ-2 Score: 1  Recent Concern: Depression (PHQ2-9) - Medium Risk (11/12/2023)   Depression (PHQ2-9)    PHQ-2 Score: 6  Alcohol Screen: Not on file  Housing: Low Risk (10/14/2023)   Epic    Unable to Pay for Housing in the Last Year: No    Number of Times Moved in the Last Year: 0    Homeless in the Last Year: No  Utilities: Not At Risk (10/14/2023)   Epic    Threatened with loss of utilities: No  Health Literacy: Not on file   Allergies: Allergies  Allergen Reactions   Bee Pollen Nausea And Vomiting    Itchy, swelling, watery eyes, runny nose.   Pollen Extract Nausea And Vomiting    Itchy, swelling, watery eyes, runny nose.   Current Medications: Current Outpatient Medications  Medication Sig Dispense Refill   clotrimazole -betamethasone  (LOTRISONE ) cream APPLY TO AFFECTED AREA TWICE A DAY     dexamethasone  (DECADRON ) 4 MG tablet TAKE 2 TABLETS BY MOUTH STARTING THE DAY AFTER CHEMOTHERAPY FOR 3 DAYS. TAKE WITH FOOD.     diclofenac  Sodium (VOLTAREN ) 1 % GEL Apply 4 g topically 4 (four) times daily. 50 g 0   doxycycline  (VIBRA -TABS) 100 MG tablet TAKE 1 TABLET BY MOUTH 2 TIMES DAILY FOR 7 DAYS.     ELIQUIS  2.5 MG TABS tablet TAKE 1 TABLET BY MOUTH TWICE A DAY 60 tablet 3   LACTULOSE  PO TAKE 15-30 MLS (10-20 G TOTAL) BY MOUTH 2 (TWO) TIMES DAILY AS NEEDED (CONSTIPATION).     lidocaine -prilocaine  (EMLA ) cream Apply to port site as directed 30 g 3   LORazepam  (ATIVAN ) 0.5 MG tablet Take 1 tablet (0.5 mg total) by mouth every 8 (eight) hours. 30 tablet 0   magnesium  hydroxide (MILK OF MAGNESIA) 400 MG/5ML suspension Take 15 mLs by mouth daily as needed for moderate constipation. Take if no bowel movement for 2 days.     meloxicam  (MOBIC ) 15  MG tablet Take 1 tablet daily with meals     METHYLPREDNISOLONE , PAK, PO Take 1 dose pk by oral route.     nystatin  (MYCOSTATIN /NYSTOP ) powder Apply 1 Application topically 3 (three) times daily. 30 g 3   oxyCODONE  (OXY IR/ROXICODONE ) 5 MG immediate release tablet Take one tablet daily as needed for pain. 60 tablet 0   polyethylene glycol (MIRALAX ) 17 g packet Take 17 g by mouth daily. To prevent constipation 30 each 3   pregabalin  (LYRICA ) 75 MG capsule TAKE 1 CAPSULE BY MOUTH TWICE A DAY 60 capsule 0   semaglutide -weight management (WEGOVY ) 1 MG/0.5ML SOAJ SQ injection Inject 1 mg into the skin once a week. 2 mL 0   semaglutide -weight management (WEGOVY ) 1.7 MG/0.75ML SOAJ SQ  injection Inject 1.7 mg into the skin once a week. 3 mL 1   No current facility-administered medications for this visit.   Review of Systems General:  fatigue Skin: no complaints Eyes: no complaints HEENT: no complaints Breasts: no complaints Pulmonary: no complaints Cardiac: leg swelling Gastrointestinal: no complaints Genitourinary/Sexual: no complaints Ob/Gyn: no complaints Musculoskeletal: back pain Hematology: no complaints Neurologic/Psych: no complaints    Objective:  Physical Examination:  There were no vitals filed for this visit.  There is no height or weight on file to calculate BMI.  GENERAL: Patient is a well appearing female in no acute distress HEENT:  Atraumatic and normocephalic. PERRL, neck supple. NODES:  No cervical, supraclavicular, axillary, or inguinal lymphadenopathy palpated.  LUNGS:  Normal respiratory effort ABDOMEN:  Soft, nontender. Nondistended. No masses/ascites/hernia/or hepatomegaly.  EXTREMITIES:  No peripheral edema.   SKIN: The umbilical redness has resolved completely.  Clear with no obvious rashes or skin changes o/t mild redness under the pannus. NEURO:  Nonfocal. Well oriented.  Appropriate affect.  Pelvic: Exam chaperoned by CMA EGBUS: no lesions Cervix:  surgically absent Vagina: no lesions, no discharge or bleeding Uterus: surgically absent BME: no palpable masses Rectovaginal: deferred    IMAGING:  As per HPI  Assessment:  Jean Morris is a 48 y.o. female with PMB, anemia and large uterus diagnosed with figo grade 2 endometrial cancer on endometrial biopsy 11/23.  Stage IVB based on suspicious lung lesion, L2 vertebral lesion, significantly enlarged left aortic adenopathy consistent with metastatic disease on imaging. Improved disease control s/p 6 cycles of chemotherapy with carbo/taxol  and keytruda .  PET/CT 2/34 showed enlarged uterus with hypermetabolic endometrial thickening and no other PET avid lesions.  In view of this, underwent TLH BSO 06/06/22 for disease control with excellent recovery.  Pathology showed grade 2 endometrioid cancer with 50% invasion, cervix, adnexa and washings negative.  PET scan 7/24 equivoval activity in retroperitoneal nodes. She has now completed 14 cycles of maintenance keytruda  per GY018 10/25.   10/28/23 PET showed stable but minimal residual abdominal retroperitoneal left periaortic soft tissue nodule, pathy vaginal hypermetabolism. In view of this received PA node radiation with Dr Lenn. Completed this 11/25 and follow up imaging scheduled in the coming months.   No new complaints today.  Some persistent abdominal pain.    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  BRAF V600E not present. Her2 (1+). Estrogen Receptor: POSITIVE, 90%, MODERATE STAINING INTENSITY Progesterone Receptor: POSITIVE, 80%, STRONG STAINING INTENSITY  History of PE currently on Eliquis   Abdominal pain, uncertain etiology  Back Pain- moderate disc bulge of L4-L5 that she is symptomatic for. L2 compression fracture with height loss however, asymptomatic. Receiving injections which has been helpful.   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 Plan:   Problem List Items Addressed This Visit       Genitourinary   Endometrial cancer (HCC) - Primary   If she develops progressive diease then it would be an option for pembrolizumab  + lenvatinib.  In view of endometrioid histology and positive ER/PR expression then she could also be a candidate for hormonal regimen such as alternating megace and tamoxifen vs everolimus + letrozole. Clinical trials are also an option.   She enrolled on the [employer] consortium study.   We will plan to see her back in 3  months for continued close surveillance.   The patient's diagnosis, an outline of the further diagnostic and laboratory studies which will be required, the recommendation for surgery, and alternatives were discussed with her and her accompanying family members.  All questions were answered to their satisfaction.  Tinnie Dawn, DNP, AGNP-C, AOCNP [facility] [phone] (clinic)  I personally had a face to face interaction and evaluated the patient jointly with the NP, Ms. Tinnie Dawn.  I have reviewed her history and available records and have performed the key portions of the physical exam including lymph node survey, abdominal exam, pelvic exam with my findings confirming those documented above by the APP.  I have discussed the case with the APP and the patient.  I agree with the above documentation, assessment and plan which was fully formulated by me.  Counseling was completed by me.   I personally saw the patient and performed a substantive portion of this encounter in conjunction with the listed APP as documented above.  Prentice Agent, MD

## 2024-02-12 ENCOUNTER — Other Ambulatory Visit: Payer: Self-pay

## 2024-02-15 ENCOUNTER — Ambulatory Visit
Admission: RE | Admit: 2024-02-15 | Discharge: 2024-02-15 | Disposition: A | Discharge status: Home / Self Care | Source: Ambulatory Visit | Attending: Oncology | Admitting: Oncology

## 2024-02-15 DIAGNOSIS — C541 Malignant neoplasm of endometrium: Secondary | ICD-10-CM | POA: Insufficient documentation

## 2024-02-15 DIAGNOSIS — I700 Atherosclerosis of aorta: Secondary | ICD-10-CM | POA: Insufficient documentation

## 2024-02-15 DIAGNOSIS — I517 Cardiomegaly: Secondary | ICD-10-CM | POA: Insufficient documentation

## 2024-02-15 DIAGNOSIS — R590 Localized enlarged lymph nodes: Secondary | ICD-10-CM | POA: Diagnosis not present

## 2024-02-15 LAB — GLUCOSE, CAPILLARY: Glucose-Capillary: 95 mg/dL (ref 70–99)

## 2024-02-15 MED ORDER — FLUDEOXYGLUCOSE F - 18 (FDG) INJECTION
12.0000 | Freq: Once | INTRAVENOUS | Status: AC | PRN
  Administered 2024-02-15: 13.1 via INTRAVENOUS

## 2024-02-17 ENCOUNTER — Ambulatory Visit

## 2024-02-25 ENCOUNTER — Encounter: Payer: Self-pay | Admitting: Oncology

## 2024-02-25 DIAGNOSIS — R350 Frequency of micturition: Secondary | ICD-10-CM

## 2024-02-26 ENCOUNTER — Inpatient Hospital Stay: Attending: Obstetrics and Gynecology | Admitting: Nurse Practitioner

## 2024-02-26 ENCOUNTER — Inpatient Hospital Stay: Attending: Obstetrics and Gynecology

## 2024-02-26 ENCOUNTER — Other Ambulatory Visit: Payer: Self-pay

## 2024-02-26 ENCOUNTER — Encounter: Payer: Self-pay | Admitting: Oncology

## 2024-02-26 ENCOUNTER — Other Ambulatory Visit: Payer: Self-pay | Admitting: Lab

## 2024-02-26 VITALS — BP 120/94 | HR 102 | Temp 98.1°F | Resp 18 | Wt 341.0 lb

## 2024-02-26 DIAGNOSIS — Z87440 Personal history of urinary (tract) infections: Secondary | ICD-10-CM | POA: Diagnosis not present

## 2024-02-26 DIAGNOSIS — R350 Frequency of micturition: Secondary | ICD-10-CM | POA: Diagnosis not present

## 2024-02-26 DIAGNOSIS — Z8042 Family history of malignant neoplasm of prostate: Secondary | ICD-10-CM | POA: Diagnosis not present

## 2024-02-26 DIAGNOSIS — Z809 Family history of malignant neoplasm, unspecified: Secondary | ICD-10-CM | POA: Diagnosis not present

## 2024-02-26 DIAGNOSIS — R918 Other nonspecific abnormal finding of lung field: Secondary | ICD-10-CM | POA: Diagnosis not present

## 2024-02-26 DIAGNOSIS — R590 Localized enlarged lymph nodes: Secondary | ICD-10-CM | POA: Insufficient documentation

## 2024-02-26 DIAGNOSIS — I700 Atherosclerosis of aorta: Secondary | ICD-10-CM | POA: Insufficient documentation

## 2024-02-26 DIAGNOSIS — Z86711 Personal history of pulmonary embolism: Secondary | ICD-10-CM | POA: Insufficient documentation

## 2024-02-26 DIAGNOSIS — Z90710 Acquired absence of both cervix and uterus: Secondary | ICD-10-CM | POA: Diagnosis not present

## 2024-02-26 DIAGNOSIS — Z9221 Personal history of antineoplastic chemotherapy: Secondary | ICD-10-CM | POA: Diagnosis not present

## 2024-02-26 DIAGNOSIS — R300 Dysuria: Secondary | ICD-10-CM | POA: Insufficient documentation

## 2024-02-26 DIAGNOSIS — I119 Hypertensive heart disease without heart failure: Secondary | ICD-10-CM | POA: Insufficient documentation

## 2024-02-26 DIAGNOSIS — Z7901 Long term (current) use of anticoagulants: Secondary | ICD-10-CM | POA: Insufficient documentation

## 2024-02-26 DIAGNOSIS — Z90722 Acquired absence of ovaries, bilateral: Secondary | ICD-10-CM | POA: Insufficient documentation

## 2024-02-26 DIAGNOSIS — C541 Malignant neoplasm of endometrium: Secondary | ICD-10-CM | POA: Diagnosis present

## 2024-02-26 DIAGNOSIS — Z8542 Personal history of malignant neoplasm of other parts of uterus: Secondary | ICD-10-CM | POA: Insufficient documentation

## 2024-02-26 DIAGNOSIS — Z79899 Other long term (current) drug therapy: Secondary | ICD-10-CM | POA: Insufficient documentation

## 2024-02-26 DIAGNOSIS — C772 Secondary and unspecified malignant neoplasm of intra-abdominal lymph nodes: Secondary | ICD-10-CM | POA: Diagnosis not present

## 2024-02-26 LAB — CMP (CANCER CENTER ONLY)
ALT: 11 U/L (ref 0–44)
AST: 19 U/L (ref 15–41)
Albumin: 4.1 g/dL (ref 3.5–5.0)
Alkaline Phosphatase: 88 U/L (ref 38–126)
Anion gap: 10 (ref 5–15)
BUN: 14 mg/dL (ref 6–20)
CO2: 26 mmol/L (ref 22–32)
Calcium: 10.1 mg/dL (ref 8.9–10.3)
Chloride: 100 mmol/L (ref 98–111)
Creatinine: 1.02 mg/dL — ABNORMAL HIGH (ref 0.44–1.00)
GFR, Estimated: >60 mL/min
Glucose, Bld: 91 mg/dL (ref 70–99)
Potassium: 3.9 mmol/L (ref 3.5–5.1)
Sodium: 136 mmol/L (ref 135–145)
Total Bilirubin: 0.6 mg/dL (ref 0.0–1.2)
Total Protein: 8.2 g/dL — ABNORMAL HIGH (ref 6.5–8.1)

## 2024-02-26 LAB — CBC WITH DIFFERENTIAL (CANCER CENTER ONLY)
Abs Immature Granulocytes: 0.04 K/uL (ref 0.00–0.07)
Basophils Absolute: 0 K/uL (ref 0.0–0.1)
Basophils Relative: 1 %
Eosinophils Absolute: 0.2 K/uL (ref 0.0–0.5)
Eosinophils Relative: 4 %
HCT: 36.1 % (ref 36.0–46.0)
Hemoglobin: 13 g/dL (ref 12.0–15.0)
Immature Granulocytes: 1 %
Lymphocytes Relative: 27 %
Lymphs Abs: 1.7 K/uL (ref 0.7–4.0)
MCH: 26.7 pg (ref 26.0–34.0)
MCHC: 36 g/dL (ref 30.0–36.0)
MCV: 74.1 fL — ABNORMAL LOW (ref 80.0–100.0)
Monocytes Absolute: 0.3 K/uL (ref 0.1–1.0)
Monocytes Relative: 5 %
Neutro Abs: 3.9 K/uL (ref 1.7–7.7)
Neutrophils Relative %: 62 %
Platelet Count: 343 K/uL (ref 150–400)
RBC: 4.87 MIL/uL (ref 3.87–5.11)
RDW: 15.8 % — ABNORMAL HIGH (ref 11.5–15.5)
WBC Count: 6.3 K/uL (ref 4.0–10.5)
nRBC: 0 % (ref 0.0–0.2)

## 2024-02-26 LAB — URINALYSIS, COMPLETE (UACMP) WITH MICROSCOPIC
Bacteria, UA: NONE SEEN
Bilirubin Urine: NEGATIVE
Glucose, UA: NEGATIVE mg/dL
Ketones, ur: NEGATIVE mg/dL
Nitrite: NEGATIVE
Protein, ur: 100 mg/dL — AB
RBC / HPF: >50 RBC/hpf (ref 0–5)
Specific Gravity, Urine: 1.021 (ref 1.005–1.030)
WBC, UA: >50 WBC/hpf (ref 0–5)
pH: 5 (ref 5.0–8.0)

## 2024-02-26 MED ORDER — SULFAMETHOXAZOLE-TRIMETHOPRIM 800-160 MG PO TABS: 1.0000 | ORAL_TABLET | Freq: Two times a day (BID) | ORAL | 0 refills | Status: AC

## 2024-02-26 NOTE — Progress Notes (Signed)
 "    Hematology/Oncology Consult note [facility]  Telephone:([phone] [phone]  Patient Care Team: Wellington Curtis LABOR, FNP (Inactive) as PCP - General (Family Medicine) Maurie Rayfield BIRCH, RN as Oncology Nurse Navigator Melanee Annah BROCKS, MD as Consulting Physician (Oncology)   Name of the patient: Jean Morris  [id number]  03-03-1975   Date of visit: 02/26/2024  Diagnosis-  metastatic stage IVb endometrioid endometrial cancer      Chief complaint/ Reason for visit-on for repeat labs including UA   Heme/Onc history: Patient is a 49 year old female who has seen Dr. Janit for irregular menstrual bleeding.  He has had an IUD in place and initially it was attribute it to use of IUD and was tried to be controlled with progesterone agents.  However due to worsening abdominal pain patient underwent a CT abdomen and pelvis with contrast on 12/25/2021 which showed 8 mm   Posterior lung base mass.  Retroperitoneal adenopathy measuring 2.6 cm additional retrocrural adenopathy.  Faint lucent lesion involving L2 vertebral body.  The uterus is anteverted and enlarged.  Endometrium is enlarged heterogeneous and irregular extending to the fundal myometrium.  Findings consistent with endometrial malignancy.  CT chest without contrast showed a 10 mm right lower lobe subpleural nodule.   Patient had endometrial biopsy which was consistent FIGO grade 2 endometrioid endometrial carcinoma.  Patient was seen by GYN oncology and hopes to take her for surgery if she has good response to neoadjuvant chemotherapy.MSI unstable.  BRAF negative.  Loss of major and minor MMR proteins MLH1 and PMS2 less than 5% of tumor expression.  MLH1 hyper methylation present.  This is most likely due to somatic epigenetic modification which can be seen in about 77% of sporadic endometrial cancers.   Patient completed 6 cycles of CarboTaxol Keytruda  chemotherapy and completed 14 cycles of keytruda .  PET CT  scan on 05/19/2022 showed enlarged uterus with hypermetabolic endometrial thickening.  Improving left periaortic nodal metastases.  No definitive bone metastases was noted.  Plan is to proceed with cytoreductive surgery at [facility].  Patient underwent hysterectomy and bilateral salpingo-oophorectomy on 06/10/2022.  Final pathology showed residual endometrioid adenocarcinoma of the endometrium FIGO grade 2 with marked treatment effect invading 18 mm and a 34 mm thick myometrium.  Fallopian tubes and ovaries were negative for malignancy.  Pelvic washings were negative for malignancy.  She will f/u with Dr. Mancil in 3 months for surveillance.  Repeat PET scan results are not final will f/u with Dr. Melanee in 2 weeks with results.  Interval history-patient here today for repeat blood work and ua.  She reports overall not feeling well today and endorses increased fatigue with nausea.  She has frequent UTIs.  Today she reports worsening dysuria and urinary frequency with cloudiness and foul odor.  Repeat blood work reviewed and stable.  Will go ahead and treat for UTI today and await for culture.  ECOG PS- 0 Pain scale- 0   Review of systems- Review of Systems  Constitutional:  Negative for chills, fever, malaise/fatigue and weight loss.  HENT:  Negative for congestion, ear discharge and nosebleeds.   Eyes:  Negative for blurred vision.  Respiratory:  Negative for cough, hemoptysis, sputum production, shortness of breath and wheezing.   Cardiovascular:  Negative for chest pain, palpitations, orthopnea and claudication.  Gastrointestinal:  Negative for abdominal pain, blood in stool, constipation, diarrhea, heartburn, melena, nausea and vomiting.  Genitourinary:  Positive for dysuria, frequency and urgency. Negative for flank pain  and hematuria.  Musculoskeletal:  Positive for back pain. Negative for joint pain and myalgias.  Skin:  Negative for rash.  Neurological:  Negative for dizziness, tingling, focal  weakness, seizures, weakness and headaches.  Endo/Heme/Allergies:  Does not bruise/bleed easily.  Psychiatric/Behavioral:  Negative for depression and suicidal ideas. The patient does not have insomnia.       Allergies  Allergen Reactions   Bee Pollen Nausea And Vomiting    Itchy, swelling, watery eyes, runny nose.   Pollen Extract Nausea And Vomiting    Itchy, swelling, watery eyes, runny nose.     Past Medical History:  Diagnosis Date   Anemia    Endometrial cancer (HCC)    Hypertension    Menorrhagia    Pulmonary embolism [facility])      Past Surgical History:  Procedure Laterality Date   ABDOMINAL HYSTERECTOMY Bilateral 06/10/2022   at [facility]   CESAREAN SECTION  02/25/1992   COLONOSCOPY WITH PROPOFOL  N/A 08/06/2021   Procedure: COLONOSCOPY WITH PROPOFOL ;  Surgeon: Therisa Bi, MD;  Location: [facility] ENDOSCOPY;  Service: Gastroenterology;  Laterality: N/A;   ESOPHAGOGASTRODUODENOSCOPY (EGD) WITH PROPOFOL  N/A 02/09/2020   Procedure: ESOPHAGOGASTRODUODENOSCOPY (EGD) WITH PROPOFOL ;  Surgeon: Therisa Bi, MD;  Location: [facility] ENDOSCOPY;  Service: Gastroenterology;  Laterality: N/A;   IR IMAGING GUIDED PORT INSERTION  01/17/2022   IR RADIOLOGIST EVAL & MGMT  04/28/2023   IR RADIOLOGIST EVAL & MGMT  05/05/2023    Social History   Socioeconomic History   Marital status: Media Planner    Spouse name: Not on file   Number of children: Not on file   Years of education: Not on file   Highest education level: Not on file  Occupational History   Not on file  Tobacco Use   Smoking status: Never   Smokeless tobacco: Never  Vaping Use   Vaping status: Never Used  Substance and Sexual Activity   Alcohol use: No   Drug use: No   Sexual activity: Not Currently    Birth control/protection: I.U.D.    Comment: patient states MD said did not see in CT today 01/17/2022  Other Topics Concern   Not on file  Social History Narrative   Lives with [facility], partner, and no pets.   Social  Drivers of Health   Tobacco Use: Low Risk (02/10/2024)   Patient History    Smoking Tobacco Use: Never    Smokeless Tobacco Use: Never    Passive Exposure: Not on file  Financial Resource Strain: Not on file  Food Insecurity: No Food Insecurity (10/14/2023)   Epic    Worried About Programme Researcher, Broadcasting/film/video in the Last Year: Never true    Ran Out of Food in the Last Year: Never true  Transportation Needs: No Transportation Needs (10/14/2023)   Epic    Lack of Transportation (Medical): No    Lack of Transportation (Non-Medical): No  Physical Activity: Not on file  Stress: Not on file  Social Connections: Not on file  Intimate Partner Violence: Not At Risk (10/14/2023)   Epic    Fear of Current or Ex-Partner: No    Emotionally Abused: No    Physically Abused: No    Sexually Abused: No  Depression (PHQ2-9): Low Risk (02/26/2024)   Depression (PHQ2-9)    PHQ-2 Score: 0  Recent Concern: Depression (PHQ2-9) - Medium Risk (02/10/2024)   Depression (PHQ2-9)    PHQ-2 Score: 6  Alcohol Screen: Not on file  Housing: Low Risk (10/14/2023)  Epic    Unable to Pay for Housing in the Last Year: No    Number of Times Moved in the Last Year: 0    Homeless in the Last Year: No  Utilities: Not At Risk (10/14/2023)   Epic    Threatened with loss of utilities: No  Health Literacy: Not on file    Family History  Problem Relation Age of Onset   Hypertension Mother    Stroke Mother    Heart attack Mother    Prostate cancer Father    Hypertension Father    Cancer Sister    Cancer Sister    Cancer Brother    Hypertension Other      Current Outpatient Medications:    clotrimazole -betamethasone  (LOTRISONE ) cream, APPLY TO AFFECTED AREA TWICE A DAY, Disp: , Rfl:    ELIQUIS  2.5 MG TABS tablet, TAKE 1 TABLET BY MOUTH TWICE A DAY, Disp: 60 tablet, Rfl: 3   LACTULOSE  PO, TAKE 15-30 MLS (10-20 G TOTAL) BY MOUTH 2 (TWO) TIMES DAILY AS NEEDED (CONSTIPATION)., Disp: , Rfl:    lidocaine -prilocaine  (EMLA )  cream, Apply to port site as directed, Disp: 30 g, Rfl: 3   LORazepam  (ATIVAN ) 0.5 MG tablet, Take 1 tablet (0.5 mg total) by mouth every 8 (eight) hours., Disp: 30 tablet, Rfl: 0   magnesium  hydroxide (MILK OF MAGNESIA) 400 MG/5ML suspension, Take 15 mLs by mouth daily as needed for moderate constipation. Take if no bowel movement for 2 days., Disp: , Rfl:    nystatin  (MYCOSTATIN /NYSTOP ) powder, Apply 1 Application topically 3 (three) times daily., Disp: 30 g, Rfl: 3   ondansetron  (ZOFRAN ) 4 MG tablet, Take 4 mg by mouth every 8 (eight) hours as needed for nausea or vomiting., Disp: , Rfl:    oxyCODONE  (OXY IR/ROXICODONE ) 5 MG immediate release tablet, Take one tablet daily as needed for pain., Disp: 60 tablet, Rfl: 0   polyethylene glycol (MIRALAX ) 17 g packet, Take 17 g by mouth daily. To prevent constipation, Disp: 30 each, Rfl: 3   pregabalin  (LYRICA ) 75 MG capsule, TAKE 1 CAPSULE BY MOUTH TWICE A DAY, Disp: 60 capsule, Rfl: 0   semaglutide -weight management (WEGOVY ) 1 MG/0.5ML SOAJ SQ injection, Inject 1 mg into the skin once a week., Disp: 2 mL, Rfl: 0   semaglutide -weight management (WEGOVY ) 1.7 MG/0.75ML SOAJ SQ injection, Inject 1.7 mg into the skin once a week., Disp: 3 mL, Rfl: 1   sulfamethoxazole -trimethoprim  (BACTRIM  DS) 800-160 MG tablet, Take 1 tablet by mouth 2 (two) times daily for 5 days., Disp: 10 tablet, Rfl: 0  Physical exam:  Vitals:   02/26/24 1135 02/26/24 1139  BP:  (!) 120/94  Pulse:  (!) 102  Resp:  18  Temp:  98.1 F (36.7 C)  TempSrc:  Oral  SpO2:  98%  Weight: (!) 341 lb (154.7 kg)    Physical Exam Cardiovascular:     Rate and Rhythm: Normal rate and regular rhythm.     Heart sounds: Normal heart sounds.  Pulmonary:     Effort: Pulmonary effort is normal.     Breath sounds: Normal breath sounds.  Skin:    General: Skin is warm and dry.  Neurological:     Mental Status: She is alert and oriented to person, place, and time.      I have personally  reviewed labs listed below:    Latest Ref Rng & Units 02/26/2024   11:15 AM  CMP  Glucose 70 - 99 mg/dL 91  BUN 6 - 20 mg/dL 14   Creatinine 9.55 - 1.00 mg/dL 8.97   Sodium 864 - 854 mmol/L 136   Potassium 3.5 - 5.1 mmol/L 3.9   Chloride 98 - 111 mmol/L 100   CO2 22 - 32 mmol/L 26   Calcium 8.9 - 10.3 mg/dL 89.8   Total Protein 6.5 - 8.1 g/dL 8.2   Total Bilirubin 0.0 - 1.2 mg/dL 0.6   Alkaline Phos 38 - 126 U/L 88   AST 15 - 41 U/L 19   ALT 0 - 44 U/L 11       Latest Ref Rng & Units 02/26/2024   11:12 AM  CBC  WBC 4.0 - 10.5 K/uL 6.3   Hemoglobin 12.0 - 15.0 g/dL 86.9   Hematocrit 63.9 - 46.0 % 36.1   Platelets 150 - 400 K/uL 343    I have personally reviewed Radiology images listed below: No images are attached to the encounter.  No results found.    Assessment and plan- Patient is a 49 y.o. female with history of stage IVb endometrioid endometrial carcinoma.  She is here for repeat labs as well as UA c&s.  Urinary frequency/dysuria:  Patient with frequent UTIs today reports increased dysuria, frequency, foul odor in urine and cloudiness.  Started bactrim  DS.  Collected UA C&S today.  stage IVb endometrioid endometrial carcinoma:  s/p adjuvant keytruda  on 11/24/24 on surveillance now repeat PET results pending f/u with Dr. Melanee in 2 weeks to review PET scan results.    F/U Plan: F/U in 2 weeks see Dr. Melanee for PET scan results LP  Morna Husband AGNP-C 02/26/24     Morna Husband AGNP-C [facility] [phone] 02/26/2024 2:21 PM                "

## 2024-02-26 NOTE — Telephone Encounter (Signed)
Ok to check UA

## 2024-02-29 ENCOUNTER — Telehealth: Payer: Self-pay | Admitting: Nurse Practitioner

## 2024-02-29 ENCOUNTER — Ambulatory Visit: Payer: Self-pay | Admitting: Oncology

## 2024-02-29 ENCOUNTER — Other Ambulatory Visit: Payer: Self-pay | Admitting: Nurse Practitioner

## 2024-02-29 LAB — URINE CULTURE: Culture: 70000 — AB

## 2024-02-29 MED ORDER — NITROFURANTOIN MONOHYD MACRO 100 MG PO CAPS: 100.0000 mg | ORAL_CAPSULE | Freq: Two times a day (BID) | ORAL | 0 refills | Status: AC

## 2024-02-29 NOTE — Telephone Encounter (Signed)
 Spoke with Ms. Calbert over the phone and reviewed Urine culture results that grew Ecoli.  I had prescribed her bactrim  however according to culture results the bacteria is resistant to bactrim .  Advised her to stop taking bactrim  and start on nitrofurantoin  instead per urine culture.  She reports vaginal itching and burning since starting antibiotics.  Informed her to start using OTC monistat for possible yeast infection secondary to antibiotic use.  If symptoms do not improve she can call us  back.  She verbalizes understanding.

## 2024-03-02 ENCOUNTER — Other Ambulatory Visit: Payer: Self-pay | Admitting: Nurse Practitioner

## 2024-03-02 DIAGNOSIS — C541 Malignant neoplasm of endometrium: Secondary | ICD-10-CM

## 2024-03-02 NOTE — Progress Notes (Signed)
 Dr Melanee and Dr Elby reviewed recent PET. Reviewed role of GOG 258 study results: [email or web address]. In this study patients that were dMMR/MSI-H did not benefit from radiation. Plan to review PET at Case Conference next week.

## 2024-03-03 ENCOUNTER — Other Ambulatory Visit: Payer: Self-pay

## 2024-03-08 ENCOUNTER — Telehealth: Payer: Self-pay | Admitting: Licensed Clinical Social Worker

## 2024-03-08 NOTE — Telephone Encounter (Signed)
 Tumor board review - message sent to Dr. Melanee and Rayfield Jasmine. Jean Morris meets [employer] (NCCN) criteria for genetic testing based on her age of diagnosis, referral recommended if patient is interested.   Dena Cary, MS, [facility] Genetic Counselor [email or web address] Phone: [phone]

## 2024-03-09 ENCOUNTER — Inpatient Hospital Stay

## 2024-03-09 DIAGNOSIS — C541 Malignant neoplasm of endometrium: Secondary | ICD-10-CM

## 2024-03-09 NOTE — Progress Notes (Signed)
 Deferred to tumor board tomorrow for radiology review.

## 2024-03-10 ENCOUNTER — Inpatient Hospital Stay

## 2024-03-11 ENCOUNTER — Inpatient Hospital Stay (HOSPITAL_BASED_OUTPATIENT_CLINIC_OR_DEPARTMENT_OTHER): Admitting: Oncology

## 2024-03-11 ENCOUNTER — Encounter: Payer: Self-pay | Admitting: Oncology

## 2024-03-11 VITALS — BP 150/91 | HR 83 | Temp 97.3°F | Resp 18 | Ht 65.0 in | Wt 342.2 lb

## 2024-03-11 DIAGNOSIS — C541 Malignant neoplasm of endometrium: Secondary | ICD-10-CM | POA: Diagnosis not present

## 2024-03-11 NOTE — Progress Notes (Signed)
 "    Hematology/Oncology Consult note [facility]  [phone] [phone]  Patient Care Team: Wellington Curtis LABOR, FNP (Inactive) as PCP - General (Family Medicine) Maurie Rayfield BIRCH, RN as Oncology Nurse Navigator Melanee Annah BROCKS, MD as Consulting Physician (Oncology)   Name of the patient: Jean Morris  [id number]  1975/03/26   Date of visit: 03/11/24  Diagnosis-  Cancer Staging  Endometrial cancer [facility]) Staging form: Corpus Uteri - Carcinoma and Carcinosarcoma, AJCC 8th Edition - Clinical stage from 01/15/2022: FIGO Stage IVB (cT2, cN2, cM1) - Signed by Melanee Annah BROCKS, MD on 01/15/2022 Stage prefix: Initial diagnosis    Chief complaint/ Reason for visit-routine follow-up of endometrial cancer  Heme/Onc history: Patient is a 49 year old female who has seen Dr. Janit for irregular menstrual bleeding.  He has had an IUD in place and initially it was attribute it to use of IUD and was tried to be controlled with progesterone agents.  However due to worsening abdominal pain patient underwent a CT abdomen and pelvis with contrast on 12/25/2021 which showed 8 mm   Posterior lung base mass.  Retroperitoneal adenopathy measuring 2.6 cm additional retrocrural adenopathy.  Faint lucent lesion involving L2 vertebral body.  The uterus is anteverted and enlarged.  Endometrium is enlarged heterogeneous and irregular extending to the fundal myometrium.  Findings consistent with endometrial malignancy.  CT chest without contrast showed a 10 mm right lower lobe subpleural nodule.   Patient had endometrial biopsy which was consistent FIGO grade 2 endometrioid endometrial carcinoma.  Patient was seen by GYN oncology and hopes to take her for surgery if she has good response to neoadjuvant chemotherapy.MSI unstable.  BRAF negative.  Loss of major and minor MMR proteins MLH1 and PMS2 less than 5% of tumor expression.  MLH1 hyper methylation present.  This is most  likely due to somatic epigenetic modification which can be seen in about 77% of sporadic endometrial cancers.   Patient completed 6 cycles of CarboTaxol Keytruda  chemotherapy and is currently on maintenance Keytruda .  PET CT scan on 05/19/2022 showed enlarged uterus with hypermetabolic endometrial thickening.  Improving left periaortic nodal metastases.  No definitive bone metastases was noted.  Plan is to proceed with cytoreductive surgery at [facility].  Patient underwent hysterectomy and bilateral salpingo-oophorectomy on 06/10/2022.  Final pathology showed residual endometrioid adenocarcinoma of the endometrium FIGO grade 2 with marked treatment effect invading 18 mm and a 34 mm thick myometrium.  Fallopian tubes and ovaries were negative for malignancy.  Pelvic washings were negative for malignancy.  Patient completed 14 maintenance cycles of Keytruda  in November 2025 and currently remains in remission  Interval history-she is doing well presently and denies any new complaints at this time.  She is independent of her ADLs and IADLs  ECOG PS- 1 Pain scale- 0   Review of systems- Review of Systems  Constitutional:  Negative for chills, fever, malaise/fatigue and weight loss.  HENT:  Negative for congestion, ear discharge and nosebleeds.   Eyes:  Negative for blurred vision.  Respiratory:  Negative for cough, hemoptysis, sputum production, shortness of breath and wheezing.   Cardiovascular:  Negative for chest pain, palpitations, orthopnea and claudication.  Gastrointestinal:  Negative for abdominal pain, blood in stool, constipation, diarrhea, heartburn, melena, nausea and vomiting.  Genitourinary:  Negative for dysuria, flank pain, frequency, hematuria and urgency.  Musculoskeletal:  Negative for back pain, joint pain and myalgias.  Skin:  Negative for rash.  Neurological:  Negative for dizziness, tingling,  focal weakness, seizures, weakness and headaches.  Endo/Heme/Allergies:  Does not  bruise/bleed easily.  Psychiatric/Behavioral:  Negative for depression and suicidal ideas. The patient does not have insomnia.       Allergies[1]   Past Medical History:  Diagnosis Date   Anemia    Endometrial cancer (HCC)    Hypertension    Menorrhagia    Pulmonary embolism [facility])      Past Surgical History:  Procedure Laterality Date   ABDOMINAL HYSTERECTOMY Bilateral 06/10/2022   at [facility]   CESAREAN SECTION  02/25/1992   COLONOSCOPY WITH PROPOFOL  N/A 08/06/2021   Procedure: COLONOSCOPY WITH PROPOFOL ;  Surgeon: Therisa Bi, MD;  Location: [facility] ENDOSCOPY;  Service: Gastroenterology;  Laterality: N/A;   ESOPHAGOGASTRODUODENOSCOPY (EGD) WITH PROPOFOL  N/A 02/09/2020   Procedure: ESOPHAGOGASTRODUODENOSCOPY (EGD) WITH PROPOFOL ;  Surgeon: Therisa Bi, MD;  Location: [facility] ENDOSCOPY;  Service: Gastroenterology;  Laterality: N/A;   IR IMAGING GUIDED PORT INSERTION  01/17/2022   IR RADIOLOGIST EVAL & MGMT  04/28/2023   IR RADIOLOGIST EVAL & MGMT  05/05/2023    Social History   Socioeconomic History   Marital status: Media Planner    Spouse name: Not on file   Number of children: Not on file   Years of education: Not on file   Highest education level: Not on file  Occupational History   Not on file  Tobacco Use   Smoking status: Never   Smokeless tobacco: Never  Vaping Use   Vaping status: Never Used  Substance and Sexual Activity   Alcohol use: No   Drug use: No   Sexual activity: Not Currently    Birth control/protection: I.U.D.    Comment: patient states MD said did not see in CT today 01/17/2022  Other Topics Concern   Not on file  Social History Narrative   Lives with [facility], partner, and no pets.   Social Drivers of Health   Tobacco Use: Low Risk (03/11/2024)   Patient History    Smoking Tobacco Use: Never    Smokeless Tobacco Use: Never    Passive Exposure: Not on file  Financial Resource Strain: Not on file  Food Insecurity: No Food Insecurity (10/14/2023)    Epic    Worried About Programme Researcher, Broadcasting/film/video in the Last Year: Never true    Ran Out of Food in the Last Year: Never true  Transportation Needs: No Transportation Needs (10/14/2023)   Epic    Lack of Transportation (Medical): No    Lack of Transportation (Non-Medical): No  Physical Activity: Not on file  Stress: Not on file  Social Connections: Not on file  Intimate Partner Violence: Not At Risk (10/14/2023)   Epic    Fear of Current or Ex-Partner: No    Emotionally Abused: No    Physically Abused: No    Sexually Abused: No  Depression (PHQ2-9): Low Risk (03/11/2024)   Depression (PHQ2-9)    PHQ-2 Score: 0  Recent Concern: Depression (PHQ2-9) - Medium Risk (02/10/2024)   Depression (PHQ2-9)    PHQ-2 Score: 6  Alcohol Screen: Not on file  Housing: Low Risk (10/14/2023)   Epic    Unable to Pay for Housing in the Last Year: No    Number of Times Moved in the Last Year: 0    Homeless in the Last Year: No  Utilities: Not At Risk (10/14/2023)   Epic    Threatened with loss of utilities: No  Health Literacy: Not on file    Family History  Problem Relation Age of Onset   Hypertension Mother    Stroke Mother    Heart attack Mother    Prostate cancer Father    Hypertension Father    Cancer Sister    Cancer Sister    Cancer Brother    Hypertension Other     Current Medications[2]  Physical exam:  Vitals:   03/11/24 1315 03/11/24 1319  BP: (!) 140/97 (!) 150/91  Pulse: 83   Resp: 18   Temp: (!) 97.3 F (36.3 C)   TempSrc: Tympanic   SpO2: 99%   Weight: (!) 342 lb 3.2 oz (155.2 kg)   Height: 5' 5 (1.651 m)    Physical Exam Cardiovascular:     Rate and Rhythm: Normal rate and regular rhythm.     Heart sounds: Normal heart sounds.  Pulmonary:     Effort: Pulmonary effort is normal.     Breath sounds: Normal breath sounds.  Abdominal:     General: Bowel sounds are normal.     Palpations: Abdomen is soft.  Skin:    General: Skin is warm and dry.  Neurological:      Mental Status: She is alert and oriented to person, place, and time.      I have personally reviewed labs listed below:    Latest Ref Rng & Units 02/26/2024   11:15 AM  CMP  Glucose 70 - 99 mg/dL 91   BUN 6 - 20 mg/dL 14   Creatinine 9.55 - 1.00 mg/dL 8.97   Sodium 864 - 854 mmol/L 136   Potassium 3.5 - 5.1 mmol/L 3.9   Chloride 98 - 111 mmol/L 100   CO2 22 - 32 mmol/L 26   Calcium 8.9 - 10.3 mg/dL 89.8   Total Protein 6.5 - 8.1 g/dL 8.2   Total Bilirubin 0.0 - 1.2 mg/dL 0.6   Alkaline Phos 38 - 126 U/L 88   AST 15 - 41 U/L 19   ALT 0 - 44 U/L 11       Latest Ref Rng & Units 02/26/2024   11:12 AM  CBC  WBC 4.0 - 10.5 K/uL 6.3   Hemoglobin 12.0 - 15.0 g/dL 86.9   Hematocrit 63.9 - 46.0 % 36.1   Platelets 150 - 400 K/uL 343    I have personally reviewed Radiology images listed below: No images are attached to the encounter.  NM PET Image Restag (PS) Skull Base To Thigh Result Date: 02/28/2024 CLINICAL DATA:  Subsequent treatment strategy for endometrial cancer. EXAM: NUCLEAR MEDICINE PET SKULL BASE TO THIGH TECHNIQUE: 13.1 mCi F-18 FDG was injected intravenously. Full-ring PET imaging was performed from the skull base to thigh after the radiotracer. CT data was obtained and used for attenuation correction and anatomic localization. Fasting blood glucose: 95 mg/dl COMPARISON:  90/96/7974. FINDINGS: Mediastinal blood pool activity: SUV max 3.8 Liver activity: SUV max NA NECK: No abnormal hypermetabolism. Incidental CT findings: None. CHEST: Scattered small axillary lymph nodes do not show metabolism above blood pool. No abnormal hypermetabolism. Incidental CT findings: Right IJ Port-A-Cath terminates in the right atrium. Atherosclerotic calcification of the aorta. Heart is enlarged. No pericardial or pleural effusion. ABDOMEN/PELVIS: Similar patchy uptake along the vaginal canal, SUV max 8.1. Similar small retroperitoneal lymph nodes with index left periaortic nodal soft tissue measuring  approximately 1.3 cm (6/101), SUV max 4.0. Index left internal iliac lymph node measures 5 mm (6/130), SUV max 3.8. No new hypermetabolism. Incidental CT findings: None. SKELETON: No abnormal hypermetabolism. Incidental  CT findings: Degenerative changes in the spine. IMPRESSION: 1. Similar borderline hypermetabolic small abdominal and pelvic retroperitoneal lymph nodes. No evidence of disease progression. 2. Patchy vaginal hypermetabolism, as before. 3.  Aortic atherosclerosis (ICD10-I70.0). Electronically Signed   By: Newell Eke M.D.   On: 02/28/2024 15:26     Assessment and plan- Patient is a 49 y.o. female with history of stage IVb endometrioid endometrial cancer status post 6 cycles of CarboTaxol chemotherapy, hysterectomy and bilateral salpingo-oophorectomy followed by maintenance Keytruda  for 14 cycles presently in remission here for a routine follow-up  Assessment and Plan    MSI high colon cancer in remission - I have reviewed PET CT scan images independently and we also reviewed her imaging at tumor board. Remains in remission post-pembrolizumab . Surveillance imaging shows no new or progressive disease. MSI high status suggests durable benefit from prior immunotherapy. Future progression manageable with pembrolizumab . Radiation therapy not favored. - Continue surveillance with CT scan first week of Leeza 2026. - Follow up in clinic after Sherlie CT scan (mid-Aralyn 2026). - If Sanika CT is stable, extend surveillance interval to every 6 months. - No current indication for pembrolizumab , cytotoxic chemotherapy, or radiation therapy. - Maintain port with flushes every 6 to 12 weeks.         Visit Diagnosis 1. Endometrial cancer [facility])      Dr. Annah Skene, MD, MPH [facility] [phone] 03/11/2024 4:09 PM                   [1]  Allergies Allergen Reactions   Bee Pollen Nausea And Vomiting    Itchy, swelling, watery eyes, runny nose.    Pollen Extract Nausea And Vomiting    Itchy, swelling, watery eyes, runny nose.  [2]  Current Outpatient Medications:    clotrimazole -betamethasone  (LOTRISONE ) cream, APPLY TO AFFECTED AREA TWICE A DAY, Disp: , Rfl:    ELIQUIS  2.5 MG TABS tablet, TAKE 1 TABLET BY MOUTH TWICE A DAY, Disp: 60 tablet, Rfl: 3   LACTULOSE  PO, TAKE 15-30 MLS (10-20 G TOTAL) BY MOUTH 2 (TWO) TIMES DAILY AS NEEDED (CONSTIPATION)., Disp: , Rfl:    lidocaine -prilocaine  (EMLA ) cream, Apply to port site as directed, Disp: 30 g, Rfl: 3   LORazepam  (ATIVAN ) 0.5 MG tablet, Take 1 tablet (0.5 mg total) by mouth every 8 (eight) hours., Disp: 30 tablet, Rfl: 0   magnesium  hydroxide (MILK OF MAGNESIA) 400 MG/5ML suspension, Take 15 mLs by mouth daily as needed for moderate constipation. Take if no bowel movement for 2 days., Disp: , Rfl:    nystatin  (MYCOSTATIN /NYSTOP ) powder, Apply 1 Application topically 3 (three) times daily., Disp: 30 g, Rfl: 3   ondansetron  (ZOFRAN ) 4 MG tablet, Take 4 mg by mouth every 8 (eight) hours as needed for nausea or vomiting., Disp: , Rfl:    oxyCODONE  (OXY IR/ROXICODONE ) 5 MG immediate release tablet, Take one tablet daily as needed for pain., Disp: 60 tablet, Rfl: 0   polyethylene glycol (MIRALAX ) 17 g packet, Take 17 g by mouth daily. To prevent constipation, Disp: 30 each, Rfl: 3   pregabalin  (LYRICA ) 75 MG capsule, TAKE 1 CAPSULE BY MOUTH TWICE A DAY, Disp: 60 capsule, Rfl: 0   semaglutide -weight management (WEGOVY ) 1 MG/0.5ML SOAJ SQ injection, Inject 1 mg into the skin once a week., Disp: 2 mL, Rfl: 0   semaglutide -weight management (WEGOVY ) 1.7 MG/0.75ML SOAJ SQ injection, Inject 1.7 mg into the skin once a week., Disp: 3 mL, Rfl: 1  "

## 2024-03-17 ENCOUNTER — Ambulatory Visit (INDEPENDENT_AMBULATORY_CARE_PROVIDER_SITE_OTHER)

## 2024-03-17 DIAGNOSIS — G629 Polyneuropathy, unspecified: Secondary | ICD-10-CM | POA: Diagnosis not present

## 2024-03-17 DIAGNOSIS — C541 Malignant neoplasm of endometrium: Secondary | ICD-10-CM | POA: Diagnosis not present

## 2024-03-17 DIAGNOSIS — R7303 Prediabetes: Secondary | ICD-10-CM

## 2024-03-17 DIAGNOSIS — L304 Erythema intertrigo: Secondary | ICD-10-CM | POA: Diagnosis not present

## 2024-03-17 MED ORDER — PREGABALIN 75 MG PO CAPS: 75.0000 mg | ORAL_CAPSULE | Freq: Two times a day (BID) | ORAL | 2 refills | Status: AC

## 2024-03-17 MED ORDER — SEMAGLUTIDE-WEIGHT MANAGEMENT 1.7 MG/0.75ML ~~LOC~~ SOAJ: 1.7000 mg | SUBCUTANEOUS | 1 refills | Status: AC

## 2024-03-17 MED ORDER — NYSTATIN 100000 UNIT/GM EX POWD: 1.0000 | Freq: Three times a day (TID) | CUTANEOUS | 3 refills | Status: AC

## 2024-03-17 NOTE — Patient Instructions (Signed)
 [facility]   [address], [address] [place],  [place]  [address] Main: [phone]

## 2024-03-17 NOTE — Progress Notes (Signed)
 "    Established patient visit   Patient: Jean Morris   DOB: 1975/05/03   48 y.o. Female  MRN: [id number] Visit Date: 03/17/2024  Today's healthcare provider: Isaiah DELENA Pepper, MD   Chief Complaint  Patient presents with   Transitions Of Care    Weight check   Subjective    HPI  Discussed the use of AI scribe software for clinical note transcription with the patient, who gave verbal consent to proceed.  History of Present Illness Jean Morris is a 49 year old female who presents for a weight check and discussion about Wegovy .  She has been using Wegovy  for weight loss but has faced difficulties obtaining it due to insurance issues, requiring prior authorization to avoid a cost of $1800. She has been unable to take the medication for the past two weeks. Previously, she was on a 1.7 mg dose and lost approximately 20 pounds, reducing her weight from 360 to 340 pounds. She is concerned about regaining weight since stopping the medication.  She has a history of chemotherapy for endometrial cancer, completed about a year ago, and is currently in remission. She follows up with her oncologist every four months. During chemotherapy, she lost over 100 pounds in three months but has since regained some weight. She experiences neuropathy in both feet, more severe in the left foot and leg, affecting her mobility. She uses Lyrica  twice daily, Tylenol  Arthritis, and occasionally other pain medications to manage symptoms.   She takes Eliquis  due to a port and uses nystatin  powder for rashes that developed post-chemotherapy. She experiences rashes under her breasts and in skin folds, which she attributes to being immunocompromised after chemotherapy.   Her blood pressure has been high in the past but was normal at this visit. She engages in chair exercises and short walks to manage her weight and mobility issues, though she finds it challenging due to pain and cold weather. She is concerned about  maintaining her weight loss and managing her symptoms effectively.   Medications: Show/hide medication list[1]  Review of Systems as noted in HPI.      Objective    BP 129/73   Pulse 86   Temp 97.9 F (36.6 C) (Oral)   LMP 02/17/2022   SpO2 96%    Physical Exam Constitutional:      Appearance: Normal appearance.  HENT:     Head: Normocephalic and atraumatic.     Mouth/Throat:     Mouth: Mucous membranes are moist.  Eyes:     Pupils: Pupils are equal, round, and reactive to light.  Pulmonary:     Effort: Pulmonary effort is normal.  Skin:    General: Skin is warm.  Neurological:     General: No focal deficit present.     Mental Status: She is alert.      No results found for any visits on 03/17/24.  Assessment & Plan     Problem List Items Addressed This Visit       Genitourinary   Endometrial cancer (HCC)     Other   Morbid obesity with BMI of 60.0-69.9, adult (HCC) - Primary   Relevant Medications   semaglutide -weight management (WEGOVY ) 1.7 MG/0.75ML SOAJ SQ injection   Prediabetes   Relevant Medications   semaglutide -weight management (WEGOVY ) 1.7 MG/0.75ML SOAJ SQ injection   Other Visit Diagnoses       Neuropathy       Relevant Medications   pregabalin  (LYRICA ) 75 MG capsule  Intertrigo       Relevant Medications   nystatin  (MYCOSTATIN /NYSTOP ) powder      Assessment & Plan Morbid obesity (HCC) BMI 56. Recent weight loss from 360 lbs to 341 lbs. Previously effective on Wegovy  1.7mg , now unavailable due to insurance issues. Concern about weight regain. - Will send information to prior authorization team for Wegovy  - Consider obtaining Wegovy  from [facility] pharmacy if authorization denied. - Encouraged continuation of chair exercises and gradual increase in physical activity.  Endometrial Cancer [facility]) Patient in remission from stage IV endometrial cancer. Under surveillance. Seen by Dr. Melanee. 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 Likely 2/2 chemotherapy. - Refilled Lyrica  prescription at [facility]. - Encouraged continuation of current management and gradual increase in physical activity as tolerated.  Prediabetes Last A1c 5.9. Discussed eating a balanced diet and incorporating movement into daily routine.   Intertrigo Recurrent fungal infections in skin folds, likely due to immunocompromised state post-chemotherapy and radiation. - Refilled nystatin  powder prescription at [facility] pharmacy.    Return in about 3 months (around 06/15/2024) for Weight Follow Up.       Isaiah DELENA Pepper, MD  [facility] [phone] (phone) [phone] (fax)     [1]  Outpatient Medications Prior to Visit  Medication Sig   clotrimazole -betamethasone  (LOTRISONE ) cream APPLY TO AFFECTED AREA TWICE A DAY   ELIQUIS  2.5 MG TABS tablet TAKE 1 TABLET BY MOUTH TWICE A DAY   LACTULOSE  PO TAKE 15-30 MLS (10-20 G TOTAL) BY MOUTH 2 (TWO) TIMES DAILY AS NEEDED (CONSTIPATION).   lidocaine -prilocaine  (EMLA ) cream Apply to port site as directed   LORazepam  (ATIVAN ) 0.5 MG tablet Take 1 tablet (0.5 mg total) by mouth every 8 (eight) hours.   magnesium  hydroxide (MILK OF MAGNESIA) 400 MG/5ML suspension Take 15 mLs by mouth daily as needed for moderate constipation. Take if no bowel movement for 2 days.   ondansetron  (ZOFRAN ) 4 MG tablet Take 4 mg by mouth every 8 (eight) hours as needed for nausea or vomiting.   oxyCODONE  (OXY IR/ROXICODONE ) 5 MG immediate release tablet Take one tablet daily as needed for pain.   polyethylene glycol (MIRALAX ) 17 g packet Take 17 g by mouth daily. To prevent constipation   [DISCONTINUED] nystatin  (MYCOSTATIN /NYSTOP ) powder Apply 1 Application topically 3 (three) times daily.   [DISCONTINUED] pregabalin  (LYRICA ) 75 MG capsule TAKE 1 CAPSULE BY MOUTH TWICE A DAY    [DISCONTINUED] semaglutide -weight management (WEGOVY ) 1.7 MG/0.75ML SOAJ SQ injection Inject 1.7 mg into the skin once a week.   [DISCONTINUED] semaglutide -weight management (WEGOVY ) 1 MG/0.5ML SOAJ SQ injection Inject 1 mg into the skin once a week. (Patient not taking: Reported on 03/17/2024)   No facility-administered medications prior to visit.   "

## 2024-03-18 ENCOUNTER — Encounter: Payer: Self-pay | Admitting: *Deleted

## 2024-03-21 ENCOUNTER — Other Ambulatory Visit: Payer: Self-pay

## 2024-03-22 ENCOUNTER — Telehealth: Payer: Self-pay

## 2024-03-22 ENCOUNTER — Other Ambulatory Visit (HOSPITAL_COMMUNITY): Payer: Self-pay

## 2024-03-22 ENCOUNTER — Encounter: Payer: Self-pay | Admitting: Oncology

## 2024-03-22 NOTE — Telephone Encounter (Signed)
 Pharmacy Patient Advocate Encounter   Received notification from Patient Advice Request messages that prior authorization for Wegovy  1.7mg /0.5ml is required/requested.   Insurance verification completed.   The patient is insured through [facility] [employer].   Per test claim: PA required; PA submitted to above mentioned insurance via Latent Key/confirmation #/EOC [id number] Status is pending

## 2024-03-22 NOTE — Telephone Encounter (Signed)
 Contacted patient via phone and advised PA is not needed so we do not have a authorization number but she should also be covered until 04/2724 according to our PA team. Patient verbalized understanding and reports she is just going to contact [facility] as her insurance is the one asking for an authorization number. I verbalized understanding and advised to contact us  if she is till having trouble.

## 2024-03-22 NOTE — Telephone Encounter (Signed)
 Pharmacy Patient Advocate Encounter  Received notification from [facility] [employer] that Prior Authorization for Wegovy  1.7mg /0.14ml has been CANCELLED due to available without authorization   Placed a call to the insurance and per the representative, the patient will have to call the member number that is on the back of the card for more information.   There is an approval on file that is good until 05/17/24.

## 2024-03-22 NOTE — Telephone Encounter (Signed)
 Please let patient know that the PA for her Wegovy  is approved. It should be covered by insurance.

## 2024-03-23 NOTE — Telephone Encounter (Signed)
 error

## 2024-03-26 ENCOUNTER — Telehealth: Admitting: Physician Assistant

## 2024-03-26 DIAGNOSIS — M1712 Unilateral primary osteoarthritis, left knee: Secondary | ICD-10-CM | POA: Diagnosis not present

## 2024-03-26 NOTE — Progress Notes (Signed)
 " Virtual Visit Consent   Jean Morris, you are scheduled for a virtual visit with a [facility] provider today. Just as with appointments in the office, your consent must be obtained to participate. Your consent will be active for this visit and any virtual visit you may have with one of our providers in the next 365 days. If you have a MyChart account, a copy of this consent can be sent to you electronically.  As this is a virtual visit, video technology does not allow for your provider to perform a traditional examination. This may limit your provider's ability to fully assess your condition. If your provider identifies any concerns that need to be evaluated in person or the need to arrange testing (such as labs, EKG, etc.), we will make arrangements to do so. Although advances in technology are sophisticated, we cannot ensure that it will always work on either your end or our end. If the connection with a video visit is poor, the visit may have to be switched to a telephone visit. With either a video or telephone visit, we are not always able to ensure that we have a secure connection.  By engaging in this virtual visit, you consent to the provision of healthcare and authorize for your insurance to be billed (if applicable) for the services provided during this visit. Depending on your insurance coverage, you may receive a charge related to this service.  I need to obtain your verbal consent now. Are you willing to proceed with your visit today? Jean Morris has provided verbal consent on 03/26/2024 for a virtual visit (video or telephone). Tanylah Schnoebelen, PA-C  Date: 03/26/2024 12:44 PM   Virtual Visit via Video Note   I, Rishik Tubby, connected with  Jean Morris  ([id number], Nov 14, 1975) on 03/26/24 at 12:30 PM EST by a video-enabled telemedicine application and verified that I am speaking with the correct person using two identifiers.  Location: Patient: Virtual Visit Location Patient:  Home Provider: Virtual Visit Location Provider: Home Office   I discussed the limitations of evaluation and management by telemedicine and the availability of in person appointments. The patient expressed understanding and agreed to proceed.    History of Present Illness: Jean Morris is a 49 y.o. who identifies as a female who was assigned female at birth, and is being seen today for knee swelling.  HPI: Past medical history for PE, endometrial cancer, morbid obesity who presents for evaluation of left knee swelling for the last 3 days.  Patient reports no recent trauma or injury.  Does admit reports that she was informed in the past that she has arthritis.  She reports noticing the swelling that is right above the knee with some discomfort with walking.  No over-the-counter treatments tried.  She is currently on anti-anticoagulation therapy.  Denies any open wounds, redness.     Problems:  Patient Active Problem List   Diagnosis Date Noted   Prediabetes 11/13/2023   Low back pain 01/09/2023   Hypokalemia 04/01/2022   Endometrial cancer (HCC) 01/15/2022   Iron  deficiency anemia 01/15/2022   Morbid obesity with BMI of 60.0-69.9, adult (HCC) 04/18/2015   Abnormal uterine bleeding 04/18/2015   Anemia 04/18/2015   Pulmonary embolism (HCC) 03/11/2015    Allergies: Allergies[1] Medications: Current Medications[2]  Observations/Objective: Patient is well-developed, well-nourished in no acute distress.  Resting comfortably  at home.  Head is normocephalic, atraumatic.  No labored breathing.  Speech is clear and coherent with logical  content.  Patient is alert and oriented at baseline.  Left knee: Edema noted superior to the patella, limited examination due to body habitus  No erythema    Assessment and Plan: 1. Arthritis of left knee (Primary)  Likely arthritis of the left knee given no recent trauma or injury Patient unable to take NSAIDs at this time due to current  anti-coagulation therapy Patient advised to continue monitoring her symptoms Leg elevation May use a compress, elastic bandage Trial of topical Voltaren  gel for pain as needed  Patient advised to follow-up with PCP in the next 5 to 7 days, sooner if worsening symptoms   Follow Up Instructions: I discussed the assessment and treatment plan with the patient. The patient was provided an opportunity to ask questions and all were answered. The patient agreed with the plan and demonstrated an understanding of the instructions.  A copy of instructions were sent to the patient via MyChart unless otherwise noted below.    The patient was advised to call back or seek an in-person evaluation if the symptoms worsen or if the condition fails to improve as anticipated.    Byron Peacock, PA-C    [1]  Allergies Allergen Reactions   Bee Pollen Nausea And Vomiting    Itchy, swelling, watery eyes, runny nose.   Pollen Extract Nausea And Vomiting    Itchy, swelling, watery eyes, runny nose.  [2]  Current Outpatient Medications:    clotrimazole -betamethasone  (LOTRISONE ) cream, APPLY TO AFFECTED AREA TWICE A DAY, Disp: , Rfl:    ELIQUIS  2.5 MG TABS tablet, TAKE 1 TABLET BY MOUTH TWICE A DAY, Disp: 60 tablet, Rfl: 3   LACTULOSE  PO, TAKE 15-30 MLS (10-20 G TOTAL) BY MOUTH 2 (TWO) TIMES DAILY AS NEEDED (CONSTIPATION)., Disp: , Rfl:    lidocaine -prilocaine  (EMLA ) cream, Apply to port site as directed, Disp: 30 g, Rfl: 3   LORazepam  (ATIVAN ) 0.5 MG tablet, Take 1 tablet (0.5 mg total) by mouth every 8 (eight) hours., Disp: 30 tablet, Rfl: 0   magnesium  hydroxide (MILK OF MAGNESIA) 400 MG/5ML suspension, Take 15 mLs by mouth daily as needed for moderate constipation. Take if no bowel movement for 2 days., Disp: , Rfl:    nystatin  (MYCOSTATIN /NYSTOP ) powder, Apply 1 Application topically 3 (three) times daily., Disp: 30 g, Rfl: 3   ondansetron  (ZOFRAN ) 4 MG tablet, Take 4 mg by mouth every 8 (eight) hours as  needed for nausea or vomiting., Disp: , Rfl:    oxyCODONE  (OXY IR/ROXICODONE ) 5 MG immediate release tablet, Take one tablet daily as needed for pain., Disp: 60 tablet, Rfl: 0   polyethylene glycol (MIRALAX ) 17 g packet, Take 17 g by mouth daily. To prevent constipation, Disp: 30 each, Rfl: 3   pregabalin  (LYRICA ) 75 MG capsule, Take 1 capsule (75 mg total) by mouth 2 (two) times daily., Disp: 60 capsule, Rfl: 2   semaglutide -weight management (WEGOVY ) 1.7 MG/0.75ML SOAJ SQ injection, Inject 1.7 mg into the skin once a week., Disp: 3 mL, Rfl: 1  "

## 2024-03-31 ENCOUNTER — Other Ambulatory Visit: Payer: Self-pay

## 2024-03-31 ENCOUNTER — Other Ambulatory Visit (HOSPITAL_COMMUNITY): Payer: Self-pay

## 2024-04-06 ENCOUNTER — Inpatient Hospital Stay

## 2024-05-18 ENCOUNTER — Inpatient Hospital Stay

## 2024-05-25 ENCOUNTER — Other Ambulatory Visit

## 2024-06-01 ENCOUNTER — Ambulatory Visit: Admitting: Radiation Oncology

## 2024-06-08 ENCOUNTER — Inpatient Hospital Stay

## 2024-06-08 ENCOUNTER — Inpatient Hospital Stay: Admitting: Oncology

## 2024-06-15 ENCOUNTER — Ambulatory Visit

## 2024-06-15 ENCOUNTER — Inpatient Hospital Stay
